# Patient Record
Sex: Female | Born: 1935 | Race: White | Hispanic: No | Smoking: Never smoker
Health system: Southern US, Community
[De-identification: ages and names within clinical notes are randomized; demographics above are authoritative.]

## PROBLEM LIST (undated history)

## (undated) DIAGNOSIS — R001 Bradycardia, unspecified: Secondary | ICD-10-CM

## (undated) DIAGNOSIS — N2889 Other specified disorders of kidney and ureter: Secondary | ICD-10-CM

## (undated) DIAGNOSIS — C649 Malignant neoplasm of unspecified kidney, except renal pelvis: Secondary | ICD-10-CM

## (undated) DIAGNOSIS — R0602 Shortness of breath: Secondary | ICD-10-CM

## (undated) DIAGNOSIS — I2510 Atherosclerotic heart disease of native coronary artery without angina pectoris: Secondary | ICD-10-CM

## (undated) DIAGNOSIS — I639 Cerebral infarction, unspecified: Secondary | ICD-10-CM

## (undated) DIAGNOSIS — N183 Chronic kidney disease, stage 3 unspecified: Secondary | ICD-10-CM

## (undated) DIAGNOSIS — H3530 Unspecified macular degeneration: Secondary | ICD-10-CM

## (undated) DIAGNOSIS — I350 Nonrheumatic aortic (valve) stenosis: Secondary | ICD-10-CM

## (undated) DIAGNOSIS — M1990 Unspecified osteoarthritis, unspecified site: Secondary | ICD-10-CM

## (undated) DIAGNOSIS — I509 Heart failure, unspecified: Secondary | ICD-10-CM

## (undated) DIAGNOSIS — R011 Cardiac murmur, unspecified: Secondary | ICD-10-CM

## (undated) DIAGNOSIS — D735 Infarction of spleen: Secondary | ICD-10-CM

## (undated) DIAGNOSIS — I480 Paroxysmal atrial fibrillation: Secondary | ICD-10-CM

## (undated) DIAGNOSIS — E785 Hyperlipidemia, unspecified: Secondary | ICD-10-CM

## (undated) DIAGNOSIS — I219 Acute myocardial infarction, unspecified: Secondary | ICD-10-CM

## (undated) DIAGNOSIS — I10 Essential (primary) hypertension: Secondary | ICD-10-CM

## (undated) DIAGNOSIS — G459 Transient cerebral ischemic attack, unspecified: Secondary | ICD-10-CM

## (undated) HISTORY — PX: CARDIAC CATHETERIZATION: SHX172

## (undated) HISTORY — PX: CORONARY ARTERY BYPASS GRAFT: SHX141

## (undated) HISTORY — DX: Other specified disorders of kidney and ureter: N28.89

## (undated) HISTORY — DX: Hyperlipidemia, unspecified: E78.5

## (undated) HISTORY — PX: TYMPANOPLASTY: SHX33

## (undated) HISTORY — PX: OTHER SURGICAL HISTORY: SHX169

## (undated) HISTORY — DX: Malignant neoplasm of unspecified kidney, except renal pelvis: C64.9

## (undated) HISTORY — PX: TOTAL ABDOMINAL HYSTERECTOMY: SHX209

## (undated) HISTORY — PX: CATARACT EXTRACTION W/ INTRAOCULAR LENS  IMPLANT, BILATERAL: SHX1307

## (undated) HISTORY — DX: Infarction of spleen: D73.5

---

## 1992-11-05 ENCOUNTER — Encounter (INDEPENDENT_AMBULATORY_CARE_PROVIDER_SITE_OTHER): Payer: Self-pay | Admitting: *Deleted

## 1992-11-05 LAB — CONVERTED CEMR LAB

## 1997-06-01 ENCOUNTER — Encounter: Admission: RE | Admit: 1997-06-01 | Discharge: 1997-06-01 | Payer: Self-pay | Admitting: Family Medicine

## 1997-06-15 ENCOUNTER — Encounter: Admission: RE | Admit: 1997-06-15 | Discharge: 1997-06-15 | Payer: Self-pay | Admitting: Family Medicine

## 1997-07-20 ENCOUNTER — Encounter: Admission: RE | Admit: 1997-07-20 | Discharge: 1997-07-20 | Payer: Self-pay | Admitting: Sports Medicine

## 1997-11-11 ENCOUNTER — Encounter: Admission: RE | Admit: 1997-11-11 | Discharge: 1997-11-11 | Payer: Self-pay | Admitting: Family Medicine

## 1997-12-07 ENCOUNTER — Encounter: Admission: RE | Admit: 1997-12-07 | Discharge: 1997-12-07 | Payer: Self-pay | Admitting: Family Medicine

## 1997-12-21 ENCOUNTER — Encounter: Admission: RE | Admit: 1997-12-21 | Discharge: 1997-12-21 | Payer: Self-pay | Admitting: Family Medicine

## 1997-12-24 ENCOUNTER — Encounter: Admission: RE | Admit: 1997-12-24 | Discharge: 1997-12-24 | Payer: Self-pay | Admitting: Family Medicine

## 1998-01-20 ENCOUNTER — Encounter: Admission: RE | Admit: 1998-01-20 | Discharge: 1998-01-20 | Payer: Self-pay | Admitting: Family Medicine

## 1998-02-08 ENCOUNTER — Encounter: Admission: RE | Admit: 1998-02-08 | Discharge: 1998-02-08 | Payer: Self-pay | Admitting: Sports Medicine

## 1998-03-22 ENCOUNTER — Encounter: Admission: RE | Admit: 1998-03-22 | Discharge: 1998-03-22 | Payer: Self-pay | Admitting: Sports Medicine

## 1998-03-31 ENCOUNTER — Encounter: Admission: RE | Admit: 1998-03-31 | Discharge: 1998-03-31 | Payer: Self-pay | Admitting: Family Medicine

## 1998-06-02 ENCOUNTER — Encounter: Admission: RE | Admit: 1998-06-02 | Discharge: 1998-06-02 | Payer: Self-pay | Admitting: Family Medicine

## 1998-08-22 ENCOUNTER — Encounter: Admission: RE | Admit: 1998-08-22 | Discharge: 1998-08-22 | Payer: Self-pay | Admitting: Family Medicine

## 1998-09-06 ENCOUNTER — Encounter: Admission: RE | Admit: 1998-09-06 | Discharge: 1998-09-06 | Payer: Self-pay | Admitting: Family Medicine

## 1999-02-09 ENCOUNTER — Encounter: Admission: RE | Admit: 1999-02-09 | Discharge: 1999-02-09 | Payer: Self-pay | Admitting: Family Medicine

## 1999-03-23 ENCOUNTER — Encounter: Admission: RE | Admit: 1999-03-23 | Discharge: 1999-03-23 | Payer: Self-pay | Admitting: Family Medicine

## 1999-04-07 ENCOUNTER — Encounter: Admission: RE | Admit: 1999-04-07 | Discharge: 1999-04-07 | Payer: Self-pay | Admitting: Family Medicine

## 1999-05-01 ENCOUNTER — Encounter: Admission: RE | Admit: 1999-05-01 | Discharge: 1999-05-01 | Payer: Self-pay | Admitting: Family Medicine

## 1999-05-11 ENCOUNTER — Encounter: Admission: RE | Admit: 1999-05-11 | Discharge: 1999-05-11 | Payer: Self-pay | Admitting: Family Medicine

## 1999-06-29 ENCOUNTER — Encounter: Admission: RE | Admit: 1999-06-29 | Discharge: 1999-06-29 | Payer: Self-pay | Admitting: Family Medicine

## 1999-09-21 ENCOUNTER — Encounter: Admission: RE | Admit: 1999-09-21 | Discharge: 1999-09-21 | Payer: Self-pay | Admitting: Family Medicine

## 1999-10-12 ENCOUNTER — Encounter: Admission: RE | Admit: 1999-10-12 | Discharge: 1999-10-12 | Payer: Self-pay | Admitting: Family Medicine

## 1999-10-12 ENCOUNTER — Encounter: Payer: Self-pay | Admitting: Family Medicine

## 1999-10-19 ENCOUNTER — Encounter: Admission: RE | Admit: 1999-10-19 | Discharge: 1999-10-19 | Payer: Self-pay | Admitting: Family Medicine

## 1999-11-08 ENCOUNTER — Encounter: Admission: RE | Admit: 1999-11-08 | Discharge: 1999-11-08 | Payer: Self-pay | Admitting: Family Medicine

## 1999-12-12 ENCOUNTER — Encounter: Admission: RE | Admit: 1999-12-12 | Discharge: 1999-12-12 | Payer: Self-pay | Admitting: Family Medicine

## 2000-02-06 HISTORY — PX: CORONARY ANGIOPLASTY WITH STENT PLACEMENT: SHX49

## 2000-02-22 ENCOUNTER — Encounter: Admission: RE | Admit: 2000-02-22 | Discharge: 2000-02-22 | Payer: Self-pay | Admitting: Family Medicine

## 2000-04-24 ENCOUNTER — Encounter: Admission: RE | Admit: 2000-04-24 | Discharge: 2000-04-24 | Payer: Self-pay | Admitting: Family Medicine

## 2000-04-26 ENCOUNTER — Ambulatory Visit (HOSPITAL_COMMUNITY): Admission: RE | Admit: 2000-04-26 | Discharge: 2000-04-26 | Payer: Self-pay | Admitting: Family Medicine

## 2000-04-26 ENCOUNTER — Encounter: Payer: Self-pay | Admitting: Sports Medicine

## 2000-04-26 ENCOUNTER — Encounter: Admission: RE | Admit: 2000-04-26 | Discharge: 2000-04-26 | Payer: Self-pay | Admitting: Family Medicine

## 2000-04-26 ENCOUNTER — Encounter: Admission: RE | Admit: 2000-04-26 | Discharge: 2000-04-26 | Payer: Self-pay | Admitting: Sports Medicine

## 2000-05-16 ENCOUNTER — Encounter: Admission: RE | Admit: 2000-05-16 | Discharge: 2000-05-16 | Payer: Self-pay | Admitting: Family Medicine

## 2000-05-28 ENCOUNTER — Encounter: Payer: Self-pay | Admitting: Cardiology

## 2000-05-29 ENCOUNTER — Inpatient Hospital Stay (HOSPITAL_COMMUNITY): Admission: RE | Admit: 2000-05-29 | Discharge: 2000-05-30 | Payer: Self-pay | Admitting: Cardiology

## 2000-09-05 ENCOUNTER — Encounter: Admission: RE | Admit: 2000-09-05 | Discharge: 2000-09-05 | Payer: Self-pay | Admitting: Family Medicine

## 2000-11-07 ENCOUNTER — Encounter: Admission: RE | Admit: 2000-11-07 | Discharge: 2000-11-07 | Payer: Self-pay | Admitting: Family Medicine

## 2001-02-05 DIAGNOSIS — I639 Cerebral infarction, unspecified: Secondary | ICD-10-CM

## 2001-02-05 HISTORY — DX: Cerebral infarction, unspecified: I63.9

## 2001-03-13 ENCOUNTER — Encounter: Admission: RE | Admit: 2001-03-13 | Discharge: 2001-03-13 | Payer: Self-pay | Admitting: Family Medicine

## 2001-05-06 ENCOUNTER — Encounter: Admission: RE | Admit: 2001-05-06 | Discharge: 2001-05-06 | Payer: Self-pay | Admitting: Family Medicine

## 2001-05-28 ENCOUNTER — Encounter: Admission: RE | Admit: 2001-05-28 | Discharge: 2001-05-28 | Payer: Self-pay | Admitting: Family Medicine

## 2001-05-28 ENCOUNTER — Ambulatory Visit (HOSPITAL_COMMUNITY): Admission: RE | Admit: 2001-05-28 | Discharge: 2001-05-28 | Payer: Self-pay | Admitting: Family Medicine

## 2001-05-30 ENCOUNTER — Inpatient Hospital Stay (HOSPITAL_COMMUNITY): Admission: EM | Admit: 2001-05-30 | Discharge: 2001-07-09 | Payer: Self-pay | Admitting: Emergency Medicine

## 2001-05-30 ENCOUNTER — Encounter (INDEPENDENT_AMBULATORY_CARE_PROVIDER_SITE_OTHER): Payer: Self-pay | Admitting: *Deleted

## 2001-05-30 ENCOUNTER — Encounter: Payer: Self-pay | Admitting: Emergency Medicine

## 2001-06-02 ENCOUNTER — Encounter: Payer: Self-pay | Admitting: Thoracic Surgery (Cardiothoracic Vascular Surgery)

## 2001-06-06 ENCOUNTER — Encounter: Payer: Self-pay | Admitting: Thoracic Surgery (Cardiothoracic Vascular Surgery)

## 2001-06-07 ENCOUNTER — Encounter: Payer: Self-pay | Admitting: Thoracic Surgery (Cardiothoracic Vascular Surgery)

## 2001-06-08 ENCOUNTER — Encounter: Payer: Self-pay | Admitting: Thoracic Surgery (Cardiothoracic Vascular Surgery)

## 2001-06-09 ENCOUNTER — Encounter: Payer: Self-pay | Admitting: Thoracic Surgery (Cardiothoracic Vascular Surgery)

## 2001-06-10 ENCOUNTER — Encounter: Payer: Self-pay | Admitting: Thoracic Surgery (Cardiothoracic Vascular Surgery)

## 2001-06-11 ENCOUNTER — Encounter: Payer: Self-pay | Admitting: Thoracic Surgery (Cardiothoracic Vascular Surgery)

## 2001-06-12 ENCOUNTER — Encounter: Payer: Self-pay | Admitting: Thoracic Surgery (Cardiothoracic Vascular Surgery)

## 2001-06-13 ENCOUNTER — Encounter: Payer: Self-pay | Admitting: Thoracic Surgery (Cardiothoracic Vascular Surgery)

## 2001-06-14 ENCOUNTER — Encounter: Payer: Self-pay | Admitting: Thoracic Surgery (Cardiothoracic Vascular Surgery)

## 2001-06-15 ENCOUNTER — Encounter: Payer: Self-pay | Admitting: Thoracic Surgery (Cardiothoracic Vascular Surgery)

## 2001-06-16 ENCOUNTER — Encounter: Payer: Self-pay | Admitting: Thoracic Surgery (Cardiothoracic Vascular Surgery)

## 2001-06-17 ENCOUNTER — Encounter: Payer: Self-pay | Admitting: Thoracic Surgery (Cardiothoracic Vascular Surgery)

## 2001-06-18 ENCOUNTER — Encounter: Payer: Self-pay | Admitting: Thoracic Surgery (Cardiothoracic Vascular Surgery)

## 2001-06-19 ENCOUNTER — Encounter: Payer: Self-pay | Admitting: Thoracic Surgery (Cardiothoracic Vascular Surgery)

## 2001-06-20 ENCOUNTER — Encounter: Payer: Self-pay | Admitting: Thoracic Surgery (Cardiothoracic Vascular Surgery)

## 2001-06-21 ENCOUNTER — Encounter: Payer: Self-pay | Admitting: Thoracic Surgery (Cardiothoracic Vascular Surgery)

## 2001-06-22 ENCOUNTER — Encounter: Payer: Self-pay | Admitting: Cardiothoracic Surgery

## 2001-06-23 ENCOUNTER — Encounter: Payer: Self-pay | Admitting: Thoracic Surgery (Cardiothoracic Vascular Surgery)

## 2001-06-24 ENCOUNTER — Encounter: Payer: Self-pay | Admitting: Thoracic Surgery (Cardiothoracic Vascular Surgery)

## 2001-06-27 ENCOUNTER — Encounter: Payer: Self-pay | Admitting: Thoracic Surgery (Cardiothoracic Vascular Surgery)

## 2001-07-01 ENCOUNTER — Encounter: Payer: Self-pay | Admitting: Thoracic Surgery (Cardiothoracic Vascular Surgery)

## 2001-07-02 ENCOUNTER — Encounter: Payer: Self-pay | Admitting: Thoracic Surgery (Cardiothoracic Vascular Surgery)

## 2001-07-04 ENCOUNTER — Encounter: Payer: Self-pay | Admitting: Thoracic Surgery (Cardiothoracic Vascular Surgery)

## 2001-07-07 ENCOUNTER — Encounter: Payer: Self-pay | Admitting: Thoracic Surgery (Cardiothoracic Vascular Surgery)

## 2001-07-08 ENCOUNTER — Encounter: Payer: Self-pay | Admitting: Thoracic Surgery (Cardiothoracic Vascular Surgery)

## 2001-07-09 ENCOUNTER — Inpatient Hospital Stay: Admission: AD | Admit: 2001-07-09 | Discharge: 2001-07-21 | Payer: Self-pay | Admitting: Cardiology

## 2001-08-11 ENCOUNTER — Encounter: Payer: Self-pay | Admitting: Thoracic Surgery (Cardiothoracic Vascular Surgery)

## 2001-08-11 ENCOUNTER — Encounter
Admission: RE | Admit: 2001-08-11 | Discharge: 2001-08-11 | Payer: Self-pay | Admitting: Thoracic Surgery (Cardiothoracic Vascular Surgery)

## 2001-08-17 ENCOUNTER — Inpatient Hospital Stay (HOSPITAL_COMMUNITY): Admission: EM | Admit: 2001-08-17 | Discharge: 2001-08-22 | Payer: Self-pay | Admitting: Emergency Medicine

## 2001-08-17 ENCOUNTER — Encounter: Payer: Self-pay | Admitting: Emergency Medicine

## 2001-08-18 ENCOUNTER — Encounter: Payer: Self-pay | Admitting: Cardiovascular Disease

## 2001-08-20 ENCOUNTER — Encounter: Payer: Self-pay | Admitting: Cardiovascular Disease

## 2001-08-21 ENCOUNTER — Encounter: Payer: Self-pay | Admitting: Cardiovascular Disease

## 2001-08-22 ENCOUNTER — Encounter: Payer: Self-pay | Admitting: Cardiovascular Disease

## 2001-09-11 ENCOUNTER — Encounter: Admission: RE | Admit: 2001-09-11 | Discharge: 2001-09-11 | Payer: Self-pay | Admitting: Family Medicine

## 2001-09-15 ENCOUNTER — Encounter: Admission: RE | Admit: 2001-09-15 | Discharge: 2001-09-15 | Payer: Self-pay | Admitting: Family Medicine

## 2001-09-17 ENCOUNTER — Inpatient Hospital Stay (HOSPITAL_COMMUNITY): Admission: EM | Admit: 2001-09-17 | Discharge: 2001-09-22 | Payer: Self-pay | Admitting: Emergency Medicine

## 2001-09-17 ENCOUNTER — Encounter: Payer: Self-pay | Admitting: Emergency Medicine

## 2001-09-18 ENCOUNTER — Encounter: Payer: Self-pay | Admitting: Family Medicine

## 2001-09-20 ENCOUNTER — Encounter: Payer: Self-pay | Admitting: Cardiology

## 2001-09-21 ENCOUNTER — Encounter: Payer: Self-pay | Admitting: Family Medicine

## 2001-09-22 ENCOUNTER — Encounter: Payer: Self-pay | Admitting: Family Medicine

## 2001-09-25 ENCOUNTER — Encounter: Admission: RE | Admit: 2001-09-25 | Discharge: 2001-09-25 | Payer: Self-pay | Admitting: Family Medicine

## 2001-10-03 ENCOUNTER — Encounter: Admission: RE | Admit: 2001-10-03 | Discharge: 2001-10-03 | Payer: Self-pay | Admitting: Family Medicine

## 2001-10-14 ENCOUNTER — Encounter: Admission: RE | Admit: 2001-10-14 | Discharge: 2001-10-14 | Payer: Self-pay | Admitting: Family Medicine

## 2001-10-14 ENCOUNTER — Encounter: Payer: Self-pay | Admitting: Family Medicine

## 2001-10-20 ENCOUNTER — Encounter: Admission: RE | Admit: 2001-10-20 | Discharge: 2001-10-20 | Payer: Self-pay | Admitting: Family Medicine

## 2001-10-21 ENCOUNTER — Encounter: Admission: RE | Admit: 2001-10-21 | Discharge: 2001-10-21 | Payer: Self-pay | Admitting: Family Medicine

## 2001-10-23 ENCOUNTER — Encounter: Admission: RE | Admit: 2001-10-23 | Discharge: 2001-10-23 | Payer: Self-pay | Admitting: Family Medicine

## 2001-12-12 ENCOUNTER — Encounter: Admission: RE | Admit: 2001-12-12 | Discharge: 2001-12-12 | Payer: Self-pay | Admitting: Family Medicine

## 2001-12-16 ENCOUNTER — Encounter: Admission: RE | Admit: 2001-12-16 | Discharge: 2001-12-16 | Payer: Self-pay | Admitting: Family Medicine

## 2001-12-23 ENCOUNTER — Encounter: Admission: RE | Admit: 2001-12-23 | Discharge: 2001-12-23 | Payer: Self-pay | Admitting: Family Medicine

## 2002-02-23 ENCOUNTER — Emergency Department (HOSPITAL_COMMUNITY): Admission: EM | Admit: 2002-02-23 | Discharge: 2002-02-23 | Payer: Self-pay | Admitting: Emergency Medicine

## 2002-02-26 ENCOUNTER — Encounter: Admission: RE | Admit: 2002-02-26 | Discharge: 2002-02-26 | Payer: Self-pay | Admitting: Family Medicine

## 2002-03-09 ENCOUNTER — Encounter: Admission: RE | Admit: 2002-03-09 | Discharge: 2002-03-09 | Payer: Self-pay | Admitting: Sports Medicine

## 2002-03-09 ENCOUNTER — Encounter: Payer: Self-pay | Admitting: Sports Medicine

## 2002-03-12 ENCOUNTER — Encounter: Admission: RE | Admit: 2002-03-12 | Discharge: 2002-03-12 | Payer: Self-pay | Admitting: Family Medicine

## 2002-03-19 ENCOUNTER — Encounter: Admission: RE | Admit: 2002-03-19 | Discharge: 2002-03-19 | Payer: Self-pay | Admitting: Family Medicine

## 2002-03-24 ENCOUNTER — Encounter: Admission: RE | Admit: 2002-03-24 | Discharge: 2002-05-05 | Payer: Self-pay | Admitting: Sports Medicine

## 2002-04-09 ENCOUNTER — Encounter: Admission: RE | Admit: 2002-04-09 | Discharge: 2002-04-09 | Payer: Self-pay | Admitting: Family Medicine

## 2002-05-12 ENCOUNTER — Encounter: Admission: RE | Admit: 2002-05-12 | Discharge: 2002-05-12 | Payer: Self-pay | Admitting: Family Medicine

## 2002-09-21 ENCOUNTER — Encounter: Admission: RE | Admit: 2002-09-21 | Discharge: 2002-09-21 | Payer: Self-pay | Admitting: Family Medicine

## 2002-11-13 ENCOUNTER — Encounter: Admission: RE | Admit: 2002-11-13 | Discharge: 2002-11-13 | Payer: Self-pay | Admitting: Sports Medicine

## 2002-11-24 ENCOUNTER — Encounter: Admission: RE | Admit: 2002-11-24 | Discharge: 2002-11-24 | Payer: Self-pay | Admitting: Sports Medicine

## 2002-12-30 ENCOUNTER — Encounter: Admission: RE | Admit: 2002-12-30 | Discharge: 2002-12-30 | Payer: Self-pay | Admitting: Family Medicine

## 2003-03-31 ENCOUNTER — Encounter: Admission: RE | Admit: 2003-03-31 | Discharge: 2003-03-31 | Payer: Self-pay | Admitting: Family Medicine

## 2003-04-29 ENCOUNTER — Encounter: Admission: RE | Admit: 2003-04-29 | Discharge: 2003-04-29 | Payer: Self-pay | Admitting: Family Medicine

## 2003-08-10 ENCOUNTER — Encounter: Admission: RE | Admit: 2003-08-10 | Discharge: 2003-08-10 | Payer: Self-pay | Admitting: Sports Medicine

## 2003-11-11 ENCOUNTER — Ambulatory Visit: Payer: Self-pay | Admitting: Family Medicine

## 2004-02-08 ENCOUNTER — Ambulatory Visit: Payer: Self-pay | Admitting: Family Medicine

## 2004-02-11 ENCOUNTER — Emergency Department (HOSPITAL_COMMUNITY): Admission: EM | Admit: 2004-02-11 | Discharge: 2004-02-11 | Payer: Self-pay | Admitting: Emergency Medicine

## 2004-03-09 ENCOUNTER — Ambulatory Visit: Payer: Self-pay | Admitting: Family Medicine

## 2004-05-25 ENCOUNTER — Ambulatory Visit: Payer: Self-pay | Admitting: Family Medicine

## 2004-05-29 ENCOUNTER — Ambulatory Visit: Payer: Self-pay | Admitting: Family Medicine

## 2004-11-23 ENCOUNTER — Ambulatory Visit: Payer: Self-pay | Admitting: Family Medicine

## 2004-12-21 ENCOUNTER — Ambulatory Visit: Payer: Self-pay | Admitting: Family Medicine

## 2005-01-02 ENCOUNTER — Ambulatory Visit: Payer: Self-pay | Admitting: Family Medicine

## 2005-01-02 ENCOUNTER — Encounter: Admission: RE | Admit: 2005-01-02 | Discharge: 2005-01-02 | Payer: Self-pay | Admitting: Family Medicine

## 2005-03-15 ENCOUNTER — Ambulatory Visit: Payer: Self-pay | Admitting: Family Medicine

## 2005-04-06 ENCOUNTER — Encounter: Admission: RE | Admit: 2005-04-06 | Discharge: 2005-04-06 | Payer: Self-pay | Admitting: Sports Medicine

## 2005-06-11 ENCOUNTER — Ambulatory Visit: Payer: Self-pay | Admitting: Family Medicine

## 2005-06-21 ENCOUNTER — Ambulatory Visit: Payer: Self-pay | Admitting: Family Medicine

## 2005-09-11 ENCOUNTER — Ambulatory Visit: Payer: Self-pay | Admitting: Family Medicine

## 2005-09-14 ENCOUNTER — Ambulatory Visit: Payer: Self-pay | Admitting: Family Medicine

## 2005-09-15 ENCOUNTER — Emergency Department (HOSPITAL_COMMUNITY): Admission: EM | Admit: 2005-09-15 | Discharge: 2005-09-15 | Payer: Self-pay | Admitting: *Deleted

## 2005-10-11 ENCOUNTER — Ambulatory Visit: Payer: Self-pay | Admitting: Family Medicine

## 2005-12-13 ENCOUNTER — Ambulatory Visit: Payer: Self-pay | Admitting: Family Medicine

## 2005-12-21 ENCOUNTER — Ambulatory Visit (HOSPITAL_COMMUNITY): Admission: RE | Admit: 2005-12-21 | Discharge: 2005-12-21 | Payer: Self-pay | Admitting: Otolaryngology

## 2006-01-10 ENCOUNTER — Ambulatory Visit: Payer: Self-pay | Admitting: Family Medicine

## 2006-02-21 ENCOUNTER — Ambulatory Visit: Payer: Self-pay | Admitting: Family Medicine

## 2006-02-27 ENCOUNTER — Ambulatory Visit (HOSPITAL_COMMUNITY): Admission: RE | Admit: 2006-02-27 | Discharge: 2006-02-28 | Payer: Self-pay | Admitting: Otolaryngology

## 2006-04-04 DIAGNOSIS — M159 Polyosteoarthritis, unspecified: Secondary | ICD-10-CM | POA: Insufficient documentation

## 2006-04-04 DIAGNOSIS — I5041 Acute combined systolic (congestive) and diastolic (congestive) heart failure: Secondary | ICD-10-CM | POA: Insufficient documentation

## 2006-04-04 DIAGNOSIS — I2510 Atherosclerotic heart disease of native coronary artery without angina pectoris: Secondary | ICD-10-CM | POA: Insufficient documentation

## 2006-04-04 DIAGNOSIS — I10 Essential (primary) hypertension: Secondary | ICD-10-CM | POA: Insufficient documentation

## 2006-04-04 DIAGNOSIS — E7800 Pure hypercholesterolemia, unspecified: Secondary | ICD-10-CM | POA: Insufficient documentation

## 2006-04-05 ENCOUNTER — Encounter (INDEPENDENT_AMBULATORY_CARE_PROVIDER_SITE_OTHER): Payer: Self-pay | Admitting: *Deleted

## 2006-04-08 ENCOUNTER — Encounter: Admission: RE | Admit: 2006-04-08 | Discharge: 2006-04-08 | Payer: Self-pay | Admitting: Family Medicine

## 2006-04-10 ENCOUNTER — Encounter: Payer: Self-pay | Admitting: Family Medicine

## 2006-06-03 ENCOUNTER — Telehealth: Payer: Self-pay | Admitting: *Deleted

## 2006-06-04 ENCOUNTER — Encounter: Payer: Self-pay | Admitting: *Deleted

## 2006-06-04 ENCOUNTER — Ambulatory Visit: Payer: Self-pay | Admitting: Family Medicine

## 2006-06-04 ENCOUNTER — Emergency Department (HOSPITAL_COMMUNITY): Admission: EM | Admit: 2006-06-04 | Discharge: 2006-06-04 | Payer: Self-pay | Admitting: Emergency Medicine

## 2006-06-04 ENCOUNTER — Telehealth: Payer: Self-pay | Admitting: Family Medicine

## 2006-06-04 DIAGNOSIS — H6690 Otitis media, unspecified, unspecified ear: Secondary | ICD-10-CM | POA: Insufficient documentation

## 2006-06-26 ENCOUNTER — Encounter: Payer: Self-pay | Admitting: Family Medicine

## 2006-07-18 ENCOUNTER — Ambulatory Visit: Payer: Self-pay | Admitting: Family Medicine

## 2006-07-18 ENCOUNTER — Encounter: Admission: RE | Admit: 2006-07-18 | Discharge: 2006-07-18 | Payer: Self-pay | Admitting: Family Medicine

## 2006-07-18 LAB — CONVERTED CEMR LAB
ALT: 14 units/L (ref 0–35)
AST: 19 units/L (ref 0–37)
Albumin: 4.3 g/dL (ref 3.5–5.2)
Alkaline Phosphatase: 52 units/L (ref 39–117)
BUN: 30 mg/dL — ABNORMAL HIGH (ref 6–23)
CO2: 22 meq/L (ref 19–32)
Calcium: 9.3 mg/dL (ref 8.4–10.5)
Chloride: 104 meq/L (ref 96–112)
Creatinine, Ser: 1.62 mg/dL — ABNORMAL HIGH (ref 0.40–1.20)
Glucose, Bld: 119 mg/dL — ABNORMAL HIGH (ref 70–99)
Potassium: 4.8 meq/L (ref 3.5–5.3)
Sodium: 137 meq/L (ref 135–145)
Total Bilirubin: 1 mg/dL (ref 0.3–1.2)
Total Protein: 7.1 g/dL (ref 6.0–8.3)

## 2006-07-24 ENCOUNTER — Telehealth: Payer: Self-pay | Admitting: Family Medicine

## 2006-08-01 ENCOUNTER — Encounter: Payer: Self-pay | Admitting: Family Medicine

## 2006-08-06 ENCOUNTER — Ambulatory Visit: Payer: Self-pay | Admitting: Family Medicine

## 2006-08-19 ENCOUNTER — Ambulatory Visit: Payer: Self-pay | Admitting: Infectious Diseases

## 2006-09-18 ENCOUNTER — Ambulatory Visit: Payer: Self-pay | Admitting: Infectious Diseases

## 2006-10-23 ENCOUNTER — Ambulatory Visit: Payer: Self-pay | Admitting: Infectious Diseases

## 2006-11-07 ENCOUNTER — Ambulatory Visit: Payer: Self-pay | Admitting: Family Medicine

## 2006-11-07 LAB — CONVERTED CEMR LAB
HCT: 40 % (ref 36.0–46.0)
Hemoglobin: 13.4 g/dL (ref 12.0–15.0)
MCHC: 33.5 g/dL (ref 30.0–36.0)
MCV: 90.9 fL (ref 78.0–100.0)
Platelets: 234 10*3/uL (ref 150–400)
RBC: 4.4 M/uL (ref 3.87–5.11)
RDW: 12.9 % (ref 11.5–14.0)
WBC: 7.8 10*3/uL (ref 4.0–10.5)

## 2006-12-03 ENCOUNTER — Encounter: Payer: Self-pay | Admitting: Family Medicine

## 2006-12-09 ENCOUNTER — Ambulatory Visit: Payer: Self-pay | Admitting: Family Medicine

## 2006-12-09 DIAGNOSIS — J309 Allergic rhinitis, unspecified: Secondary | ICD-10-CM | POA: Insufficient documentation

## 2007-01-09 ENCOUNTER — Ambulatory Visit: Payer: Self-pay | Admitting: Family Medicine

## 2007-05-05 ENCOUNTER — Encounter: Payer: Self-pay | Admitting: *Deleted

## 2007-05-05 ENCOUNTER — Encounter (INDEPENDENT_AMBULATORY_CARE_PROVIDER_SITE_OTHER): Payer: Self-pay | Admitting: *Deleted

## 2007-05-05 ENCOUNTER — Ambulatory Visit: Payer: Self-pay | Admitting: Family Medicine

## 2007-06-09 ENCOUNTER — Ambulatory Visit: Payer: Self-pay | Admitting: Family Medicine

## 2007-06-09 DIAGNOSIS — G4700 Insomnia, unspecified: Secondary | ICD-10-CM | POA: Insufficient documentation

## 2007-06-09 LAB — CONVERTED CEMR LAB
ALT: 21 units/L (ref 0–35)
AST: 21 units/L (ref 0–37)
Albumin: 4.3 g/dL (ref 3.5–5.2)
Alkaline Phosphatase: 69 units/L (ref 39–117)
BUN: 33 mg/dL — ABNORMAL HIGH (ref 6–23)
CO2: 25 meq/L (ref 19–32)
Calcium: 9.9 mg/dL (ref 8.4–10.5)
Chloride: 100 meq/L (ref 96–112)
Creatinine, Ser: 1.36 mg/dL — ABNORMAL HIGH (ref 0.40–1.20)
Glucose, Bld: 111 mg/dL — ABNORMAL HIGH (ref 70–99)
Potassium: 5.4 meq/L — ABNORMAL HIGH (ref 3.5–5.3)
Sodium: 139 meq/L (ref 135–145)
TSH: 3.515 microintl units/mL (ref 0.350–5.50)
Total Bilirubin: 0.6 mg/dL (ref 0.3–1.2)
Total Protein: 7.3 g/dL (ref 6.0–8.3)

## 2007-06-10 ENCOUNTER — Encounter: Payer: Self-pay | Admitting: Family Medicine

## 2007-06-12 ENCOUNTER — Ambulatory Visit (HOSPITAL_COMMUNITY): Admission: RE | Admit: 2007-06-12 | Discharge: 2007-06-12 | Payer: Self-pay | Admitting: Family Medicine

## 2007-06-17 ENCOUNTER — Telehealth: Payer: Self-pay | Admitting: *Deleted

## 2007-06-17 ENCOUNTER — Ambulatory Visit: Payer: Self-pay | Admitting: Family Medicine

## 2007-06-17 ENCOUNTER — Ambulatory Visit (HOSPITAL_COMMUNITY): Admission: RE | Admit: 2007-06-17 | Discharge: 2007-06-17 | Payer: Self-pay | Admitting: Family Medicine

## 2007-06-17 DIAGNOSIS — E875 Hyperkalemia: Secondary | ICD-10-CM | POA: Insufficient documentation

## 2007-06-18 LAB — CONVERTED CEMR LAB
BUN: 53 mg/dL — ABNORMAL HIGH (ref 6–23)
CO2: 19 meq/L (ref 19–32)
Calcium: 10.1 mg/dL (ref 8.4–10.5)
Chloride: 94 meq/L — ABNORMAL LOW (ref 96–112)
Creatinine, Ser: 2.65 mg/dL — ABNORMAL HIGH (ref 0.40–1.20)
Glucose, Bld: 131 mg/dL — ABNORMAL HIGH (ref 70–99)
HCT: 38.8 % (ref 36.0–46.0)
Hemoglobin: 13.2 g/dL (ref 12.0–15.0)
MCHC: 34 g/dL (ref 30.0–36.0)
MCV: 89.4 fL (ref 78.0–100.0)
Platelets: 295 10*3/uL (ref 150–400)
Potassium: 5.9 meq/L — ABNORMAL HIGH (ref 3.5–5.3)
RBC: 4.34 M/uL (ref 3.87–5.11)
RDW: 13.2 % (ref 11.5–15.5)
Sodium: 128 meq/L — ABNORMAL LOW (ref 135–145)
WBC: 10.3 10*3/uL (ref 4.0–10.5)

## 2007-06-20 ENCOUNTER — Ambulatory Visit: Payer: Self-pay | Admitting: Family Medicine

## 2007-06-20 ENCOUNTER — Encounter (INDEPENDENT_AMBULATORY_CARE_PROVIDER_SITE_OTHER): Payer: Self-pay | Admitting: Family Medicine

## 2007-06-20 LAB — CONVERTED CEMR LAB
BUN: 45 mg/dL — ABNORMAL HIGH (ref 6–23)
CO2: 24 meq/L (ref 19–32)
Calcium: 9.9 mg/dL (ref 8.4–10.5)
Chloride: 98 meq/L (ref 96–112)
Creatinine, Ser: 1.67 mg/dL — ABNORMAL HIGH (ref 0.40–1.20)
Glucose, Bld: 124 mg/dL — ABNORMAL HIGH (ref 70–99)
Potassium: 5.8 meq/L — ABNORMAL HIGH (ref 3.5–5.3)
Sodium: 132 meq/L — ABNORMAL LOW (ref 135–145)

## 2007-06-23 ENCOUNTER — Ambulatory Visit: Payer: Self-pay | Admitting: Family Medicine

## 2007-06-23 ENCOUNTER — Encounter: Payer: Self-pay | Admitting: Family Medicine

## 2007-06-23 LAB — CONVERTED CEMR LAB
BUN: 29 mg/dL — ABNORMAL HIGH (ref 6–23)
CO2: 24 meq/L (ref 19–32)
Calcium: 9.7 mg/dL (ref 8.4–10.5)
Chloride: 102 meq/L (ref 96–112)
Creatinine, Ser: 1.29 mg/dL — ABNORMAL HIGH (ref 0.40–1.20)
Glucose, Bld: 121 mg/dL — ABNORMAL HIGH (ref 70–99)
Potassium: 4.9 meq/L (ref 3.5–5.3)
Sodium: 138 meq/L (ref 135–145)

## 2007-06-26 ENCOUNTER — Encounter (INDEPENDENT_AMBULATORY_CARE_PROVIDER_SITE_OTHER): Payer: Self-pay | Admitting: Family Medicine

## 2007-06-26 ENCOUNTER — Ambulatory Visit: Payer: Self-pay | Admitting: Family Medicine

## 2007-06-26 LAB — CONVERTED CEMR LAB
BUN: 25 mg/dL — ABNORMAL HIGH (ref 6–23)
CO2: 22 meq/L (ref 19–32)
Calcium: 9.4 mg/dL (ref 8.4–10.5)
Chloride: 103 meq/L (ref 96–112)
Creatinine, Ser: 1.09 mg/dL (ref 0.40–1.20)
Glucose, Bld: 115 mg/dL — ABNORMAL HIGH (ref 70–99)
Potassium: 5 meq/L (ref 3.5–5.3)
Sodium: 136 meq/L (ref 135–145)

## 2007-06-27 ENCOUNTER — Encounter (INDEPENDENT_AMBULATORY_CARE_PROVIDER_SITE_OTHER): Payer: Self-pay | Admitting: Family Medicine

## 2007-07-14 ENCOUNTER — Ambulatory Visit: Payer: Self-pay | Admitting: Family Medicine

## 2007-07-18 ENCOUNTER — Encounter: Payer: Self-pay | Admitting: *Deleted

## 2007-07-18 LAB — CONVERTED CEMR LAB
Potassium: 4.6 meq/L
Sodium: 138 meq/L

## 2007-07-29 ENCOUNTER — Telehealth: Payer: Self-pay | Admitting: *Deleted

## 2007-07-29 ENCOUNTER — Encounter: Payer: Self-pay | Admitting: *Deleted

## 2007-07-30 ENCOUNTER — Telehealth (INDEPENDENT_AMBULATORY_CARE_PROVIDER_SITE_OTHER): Payer: Self-pay | Admitting: Family Medicine

## 2007-08-14 ENCOUNTER — Ambulatory Visit: Payer: Self-pay | Admitting: Family Medicine

## 2007-08-14 LAB — CONVERTED CEMR LAB
BUN: 26 mg/dL — ABNORMAL HIGH (ref 6–23)
CO2: 24 meq/L (ref 19–32)
Calcium: 10 mg/dL (ref 8.4–10.5)
Chloride: 101 meq/L (ref 96–112)
Creatinine, Ser: 1.24 mg/dL — ABNORMAL HIGH (ref 0.40–1.20)
Glucose, Bld: 123 mg/dL — ABNORMAL HIGH (ref 70–99)
Potassium: 4.8 meq/L (ref 3.5–5.3)
Sodium: 138 meq/L (ref 135–145)

## 2007-08-15 ENCOUNTER — Encounter: Payer: Self-pay | Admitting: Family Medicine

## 2007-08-27 ENCOUNTER — Telehealth: Payer: Self-pay | Admitting: *Deleted

## 2007-09-10 ENCOUNTER — Encounter: Payer: Self-pay | Admitting: Family Medicine

## 2007-09-19 ENCOUNTER — Encounter: Payer: Self-pay | Admitting: Family Medicine

## 2007-10-01 ENCOUNTER — Encounter: Payer: Self-pay | Admitting: Family Medicine

## 2007-11-03 ENCOUNTER — Telehealth: Payer: Self-pay | Admitting: *Deleted

## 2007-11-10 ENCOUNTER — Ambulatory Visit: Payer: Self-pay | Admitting: Family Medicine

## 2007-11-10 DIAGNOSIS — L219 Seborrheic dermatitis, unspecified: Secondary | ICD-10-CM | POA: Insufficient documentation

## 2007-11-10 LAB — CONVERTED CEMR LAB

## 2007-11-27 ENCOUNTER — Telehealth: Payer: Self-pay | Admitting: *Deleted

## 2007-11-27 ENCOUNTER — Encounter: Payer: Self-pay | Admitting: Family Medicine

## 2008-01-26 ENCOUNTER — Telehealth: Payer: Self-pay | Admitting: *Deleted

## 2008-01-27 ENCOUNTER — Encounter: Payer: Self-pay | Admitting: Family Medicine

## 2008-01-27 ENCOUNTER — Ambulatory Visit: Payer: Self-pay | Admitting: Family Medicine

## 2008-01-28 ENCOUNTER — Encounter: Payer: Self-pay | Admitting: Family Medicine

## 2008-01-28 LAB — CONVERTED CEMR LAB
ALT: 13 units/L (ref 0–35)
AST: 16 units/L (ref 0–37)
Albumin: 4.4 g/dL (ref 3.5–5.2)
Alkaline Phosphatase: 73 units/L (ref 39–117)
BUN: 31 mg/dL — ABNORMAL HIGH (ref 6–23)
CO2: 22 meq/L (ref 19–32)
Calcium: 9.9 mg/dL (ref 8.4–10.5)
Chloride: 98 meq/L (ref 96–112)
Creatinine, Ser: 1.36 mg/dL — ABNORMAL HIGH (ref 0.40–1.20)
Glucose, Bld: 104 mg/dL — ABNORMAL HIGH (ref 70–99)
Potassium: 5.2 meq/L (ref 3.5–5.3)
Sodium: 138 meq/L (ref 135–145)
Total Bilirubin: 0.8 mg/dL (ref 0.3–1.2)
Total Protein: 7.7 g/dL (ref 6.0–8.3)

## 2008-03-11 ENCOUNTER — Ambulatory Visit: Payer: Self-pay | Admitting: Infectious Diseases

## 2008-03-15 ENCOUNTER — Telehealth: Payer: Self-pay | Admitting: Infectious Diseases

## 2008-04-12 ENCOUNTER — Ambulatory Visit: Payer: Self-pay | Admitting: Infectious Diseases

## 2008-04-29 ENCOUNTER — Encounter: Payer: Self-pay | Admitting: Infectious Diseases

## 2008-05-10 ENCOUNTER — Encounter: Payer: Self-pay | Admitting: Infectious Diseases

## 2008-05-11 ENCOUNTER — Telehealth: Payer: Self-pay | Admitting: *Deleted

## 2008-05-14 ENCOUNTER — Encounter: Payer: Self-pay | Admitting: Family Medicine

## 2008-05-14 ENCOUNTER — Ambulatory Visit: Payer: Self-pay | Admitting: Family Medicine

## 2008-06-07 ENCOUNTER — Encounter: Payer: Self-pay | Admitting: Infectious Diseases

## 2008-08-04 ENCOUNTER — Encounter: Payer: Self-pay | Admitting: Family Medicine

## 2008-08-19 LAB — CONVERTED CEMR LAB
Cholesterol: 195 mg/dL (ref 0–200)
HDL: 43 mg/dL (ref >39)
LDL Cholesterol: 100 mg/dL — ABNORMAL HIGH (ref 0–99)
Total CHOL/HDL Ratio: 4.5
Triglycerides: 260 mg/dL — ABNORMAL HIGH (ref <150)
VLDL: 52 mg/dL — ABNORMAL HIGH (ref 0–40)

## 2008-09-29 ENCOUNTER — Encounter: Payer: Self-pay | Admitting: Family Medicine

## 2008-10-07 ENCOUNTER — Ambulatory Visit: Payer: Self-pay | Admitting: Family Medicine

## 2008-10-07 ENCOUNTER — Telehealth: Payer: Self-pay | Admitting: *Deleted

## 2008-10-07 LAB — CONVERTED CEMR LAB
Bilirubin Urine: NEGATIVE
Casts: >20 /lpf
Glucose, Urine, Semiquant: NEGATIVE
Ketones, urine, test strip: NEGATIVE
Nitrite: NEGATIVE
Protein, U semiquant: NEGATIVE
Specific Gravity, Urine: 1.02
Urobilinogen, UA: 0.2
WBC, UA: >20 cells/hpf
pH: 6

## 2008-11-15 ENCOUNTER — Ambulatory Visit: Payer: Self-pay | Admitting: Family Medicine

## 2008-11-15 LAB — CONVERTED CEMR LAB
ALT: 13 units/L (ref 0–35)
AST: 19 units/L (ref 0–37)
Albumin: 4.5 g/dL (ref 3.5–5.2)
Alkaline Phosphatase: 61 units/L (ref 39–117)
BUN: 33 mg/dL — ABNORMAL HIGH (ref 6–23)
CO2: 23 meq/L (ref 19–32)
Calcium: 10.2 mg/dL (ref 8.4–10.5)
Chloride: 102 meq/L (ref 96–112)
Creatinine, Ser: 1.72 mg/dL — ABNORMAL HIGH (ref 0.40–1.20)
Glucose, Bld: 92 mg/dL (ref 70–99)
HCT: 35.8 % — ABNORMAL LOW (ref 36.0–46.0)
Hemoglobin: 11.6 g/dL — ABNORMAL LOW (ref 12.0–15.0)
MCHC: 32.4 g/dL (ref 30.0–36.0)
MCV: 93 fL (ref 78.0–100.0)
Platelets: 224 10*3/uL (ref 150–400)
Potassium: 5.4 meq/L — ABNORMAL HIGH (ref 3.5–5.3)
RBC: 3.85 M/uL — ABNORMAL LOW (ref 3.87–5.11)
RDW: 13.2 % (ref 11.5–15.5)
Sodium: 140 meq/L (ref 135–145)
Total Bilirubin: 0.7 mg/dL (ref 0.3–1.2)
Total Protein: 7.4 g/dL (ref 6.0–8.3)
WBC: 7.9 10*3/uL (ref 4.0–10.5)

## 2008-11-16 ENCOUNTER — Encounter: Payer: Self-pay | Admitting: Family Medicine

## 2008-11-23 ENCOUNTER — Telehealth: Payer: Self-pay | Admitting: Family Medicine

## 2008-12-06 ENCOUNTER — Ambulatory Visit: Payer: Self-pay | Admitting: Family Medicine

## 2008-12-06 ENCOUNTER — Encounter: Payer: Self-pay | Admitting: Family Medicine

## 2008-12-06 LAB — CONVERTED CEMR LAB
BUN: 29 mg/dL — ABNORMAL HIGH (ref 6–23)
CO2: 26 meq/L (ref 19–32)
Calcium: 10.2 mg/dL (ref 8.4–10.5)
Chloride: 101 meq/L (ref 96–112)
Creatinine, Ser: 1.35 mg/dL — ABNORMAL HIGH (ref 0.40–1.20)
Ferritin: 75 ng/mL (ref 10–291)
Glucose, Bld: 86 mg/dL (ref 70–99)
HCT: 36.3 % (ref 36.0–46.0)
Hemoglobin: 11.8 g/dL — ABNORMAL LOW (ref 12.0–15.0)
MCHC: 32.5 g/dL (ref 30.0–36.0)
MCV: 93.6 fL (ref 78.0–100.0)
Platelets: 238 10*3/uL (ref 150–400)
Potassium: 5.1 meq/L (ref 3.5–5.3)
RBC: 3.88 M/uL (ref 3.87–5.11)
RDW: 13.1 % (ref 11.5–15.5)
Sodium: 138 meq/L (ref 135–145)
Vitamin B-12: 1767 pg/mL — ABNORMAL HIGH (ref 211–911)
WBC: 10.5 10*3/uL (ref 4.0–10.5)

## 2008-12-07 ENCOUNTER — Encounter: Payer: Self-pay | Admitting: Family Medicine

## 2009-01-07 ENCOUNTER — Encounter: Payer: Self-pay | Admitting: Family Medicine

## 2009-01-26 ENCOUNTER — Encounter: Payer: Self-pay | Admitting: Family Medicine

## 2009-04-07 ENCOUNTER — Ambulatory Visit: Payer: Self-pay | Admitting: Family Medicine

## 2009-04-07 ENCOUNTER — Telehealth: Payer: Self-pay | Admitting: Family Medicine

## 2009-04-21 ENCOUNTER — Ambulatory Visit: Payer: Self-pay | Admitting: Family Medicine

## 2009-04-22 ENCOUNTER — Encounter: Payer: Self-pay | Admitting: Family Medicine

## 2009-04-22 LAB — CONVERTED CEMR LAB
ALT: 20 units/L (ref 0–35)
AST: 23 units/L (ref 0–37)
Albumin: 4.5 g/dL (ref 3.5–5.2)
Alkaline Phosphatase: 70 units/L (ref 39–117)
BUN: 24 mg/dL — ABNORMAL HIGH (ref 6–23)
CO2: 25 meq/L (ref 19–32)
Calcium: 9.9 mg/dL (ref 8.4–10.5)
Chloride: 100 meq/L (ref 96–112)
Cholesterol: 175 mg/dL (ref 0–200)
Creatinine, Ser: 1.48 mg/dL — ABNORMAL HIGH (ref 0.40–1.20)
Glucose, Bld: 122 mg/dL — ABNORMAL HIGH (ref 70–99)
HCT: 39.6 % (ref 36.0–46.0)
HDL: 41 mg/dL (ref >39)
Hemoglobin: 12.8 g/dL (ref 12.0–15.0)
LDL Cholesterol: 81 mg/dL (ref 0–99)
MCHC: 32.3 g/dL (ref 30.0–36.0)
MCV: 91.9 fL (ref 78.0–100.0)
Platelets: 244 10*3/uL (ref 150–400)
Potassium: 5.1 meq/L (ref 3.5–5.3)
RBC: 4.31 M/uL (ref 3.87–5.11)
RDW: 13.5 % (ref 11.5–15.5)
Sodium: 137 meq/L (ref 135–145)
Total Bilirubin: 1.1 mg/dL (ref 0.3–1.2)
Total CHOL/HDL Ratio: 4.3
Total Protein: 7.4 g/dL (ref 6.0–8.3)
Triglycerides: 265 mg/dL — ABNORMAL HIGH (ref <150)
VLDL: 53 mg/dL — ABNORMAL HIGH (ref 0–40)
WBC: 9.8 10*3/uL (ref 4.0–10.5)

## 2009-06-27 ENCOUNTER — Telehealth: Payer: Self-pay | Admitting: Family Medicine

## 2009-07-06 ENCOUNTER — Encounter: Payer: Self-pay | Admitting: Family Medicine

## 2009-07-06 ENCOUNTER — Ambulatory Visit: Payer: Self-pay | Admitting: Family Medicine

## 2009-07-06 LAB — CONVERTED CEMR LAB
ALT: 17 units/L (ref 0–35)
AST: 19 units/L (ref 0–37)
Albumin: 4.6 g/dL (ref 3.5–5.2)
Alkaline Phosphatase: 58 units/L (ref 39–117)
BUN: 28 mg/dL — ABNORMAL HIGH (ref 6–23)
CO2: 25 meq/L (ref 19–32)
Calcium: 10.1 mg/dL (ref 8.4–10.5)
Chloride: 102 meq/L (ref 96–112)
Creatinine, Ser: 1.44 mg/dL — ABNORMAL HIGH (ref 0.40–1.20)
Glucose, Bld: 107 mg/dL — ABNORMAL HIGH (ref 70–99)
Potassium: 4.9 meq/L (ref 3.5–5.3)
Sodium: 139 meq/L (ref 135–145)
Total Bilirubin: 1.2 mg/dL (ref 0.3–1.2)
Total Protein: 7.5 g/dL (ref 6.0–8.3)

## 2009-07-07 ENCOUNTER — Encounter: Payer: Self-pay | Admitting: Family Medicine

## 2009-07-07 ENCOUNTER — Telehealth (INDEPENDENT_AMBULATORY_CARE_PROVIDER_SITE_OTHER): Payer: Self-pay | Admitting: *Deleted

## 2009-07-11 ENCOUNTER — Encounter: Payer: Self-pay | Admitting: *Deleted

## 2009-08-04 ENCOUNTER — Encounter: Payer: Self-pay | Admitting: Family Medicine

## 2009-10-03 ENCOUNTER — Ambulatory Visit: Payer: Self-pay | Admitting: Vascular Surgery

## 2009-11-23 ENCOUNTER — Ambulatory Visit: Payer: Self-pay | Admitting: Family Medicine

## 2009-12-16 ENCOUNTER — Encounter: Payer: Self-pay | Admitting: Family Medicine

## 2009-12-23 ENCOUNTER — Telehealth (INDEPENDENT_AMBULATORY_CARE_PROVIDER_SITE_OTHER): Payer: Self-pay | Admitting: *Deleted

## 2010-02-27 ENCOUNTER — Encounter (INDEPENDENT_AMBULATORY_CARE_PROVIDER_SITE_OTHER): Payer: Self-pay | Admitting: *Deleted

## 2010-03-07 NOTE — Progress Notes (Signed)
Summary: Triage  Phone Note Call from Patient Call back at Home Phone [phone]   Summary of Call: Pt has sores on head, would like to be seen by Dr. Erin Hearing this week if possible. Initial call taken by: Drucie Ip,  November 03, 2007 10:47 AM  Follow-up for Phone Call        states she has been picking at them & they are scaly. does not know how long they have been there. refuses to see any other md. appt made for next monday Follow-up by: Elige Radon RN,  November 03, 2007 10:53 AM

## 2010-03-07 NOTE — Assessment & Plan Note (Signed)
Summary: FU/KH   Vital Signs:  Patient Profile:   77 Years Old Female Height:     64 inches (162.56 cm) Weight:      161.6 pounds BMI:     27.84 Temp:     98.3 degrees F oral Pulse rate:   65 / minute BP sitting:   165 / 81  (left arm) Cuff size:   regular  Pt. in pain?   yes    Location:   rt. hand  Vitals Entered By: Levert Feinstein LPN (June  8, 579FGE QA348G PM)                  PCP:  Talbert Cage MD  Chief Complaint:  f/u visit.  History of Present Illness: follow up Vomiting and HyperKalemia No vomiting or any residual symptoms.  Back to her normal weight. Started back her torsemide because of her ankle swelling but is not taking potassium or aldactone.  No cramping or shortness of breath or unusual edema  Thumb wrist DJD still hurts most of the time.  Tried a splint and this seemed to help.  No rash or redness or fever. Take   CHF No unusual shortness of breath or orthopnea or PND.  Saw Dr Melvern Banker recently.  Has echo scheduled.  Had blood drawn this AM.  ROS - as above PMH - Medications reviewed and updated in medication list.  Smoking Status noted in VS form      Current Allergies: ! * TRAMADOL      Physical Exam  General:     Well-developed,well-nourished,in no acute distress; alert,appropriate and cooperative throughout examination Extremities:     Trace edema bilaterally Left wrist in compression bandage    Impression & Recommendations:  Problem # 1:  HYPERKALEMIA (ICD-276.7) Resolved clinically.  Will follow up on her recent blood work.  Continue to HOLD aldactone Orders: [place]- Est Level  3 [id number])   Problem # 2:  HAND PAIN (ICD-729.5) Combination of djd and carpal tunnel.  Has no muscle wasting.  Trial of wrist splint continue Tylenol with vicoden when severe Orders: [place]- Est Level  3 [id number])   Problem # 3:  OSTEOARTHRITIS, MULTI SITES (ICD-715.98)  Her updated medication list for this problem includes:    Adprin B 325 Mg Tabs  (Aspirin buf(cacarb-mgcarb-mgo)) .Marland Kitchen... Take 1 tablet by mouth once a day    Vicodin 5-500 Mg Tabs (Hydrocodone-acetaminophen) .Marland Kitchen... 1 by mouth two times a day as needed thumb pain   Complete Medication List: 1)  Altace 10 Mg Caps (Ramipril) .... Take 1 tablet by mouth once a day 2)  Crestor 20 Mg Tabs (Rosuvastatin calcium) .... Take 1 tablet by mouth once a day 3)  Coreg 25 Mg Tabs (Carvedilol) .... One tab by mouth two times a day 4)  Aldactone 25 Mg Tabs (Spironolactone) .... Take 1 tablet by mouth once a day holding 5)  Demadex 20 Mg Tabs (Torsemide) .... One tablet every other day for fluid 6)  Adprin B 325 Mg Tabs (Aspirin buf(cacarb-mgcarb-mgo)) .... Take 1 tablet by mouth once a day 7)  Bl Vitamin C 500 Mg Tabs (Ascorbic acid) .... Once daily 8)  Vitamin E Natural 400 Unit Caps (Vitamin e) .... Take 1 tablet by mouth once a day 9)  Omega-3 350 Mg Caps (Omega-3 fatty acids) .... 1000mg  once daily 10)  Vicodin 5-500 Mg Tabs (Hydrocodone-acetaminophen) .Marland Kitchen.. 1 by mouth two times a day as needed thumb pain 11)  Allegra-d 12 Hour 60-120  Mg Tb12 (Fexofenadine-pseudoephedrine) .Marland Kitchen.. 1 by mouth two times a day prn 12)  Prilosec 10 Mg Cpdr (Omeprazole) .Marland Kitchen.. 1-2 by mouth at bedtime 13)  Calcium Carbonate-vitamin D 600-400 Mg-unit Tabs (Calcium carbonate-vitamin d) .... 2 daily 14)  Ambien 10 Mg Tabs (Zolpidem tartrate) .... 1/2 -1 by mouth at bedtime as needed take only rarely   Patient Instructions: 1)  Please schedule a follow-up appointment in 3 -6 months. 2)  I will check with Dr Melvern Banker for your blood tests 3)  For now keep NOT taking  the Spironolactone 4)  Wear splint when your wrist or thumb hurts but not all the time  5)  We will talk about your colonoscopy next visit   ]

## 2010-03-07 NOTE — Consult Note (Signed)
Summary: [facility]  [facility]   Imported By: Drucie Ip 06/28/2006 14:22:21  _____________________________________________________________________  External Attachment:    Type:   Image     Comment:   External Document

## 2010-03-07 NOTE — Consult Note (Signed)
Summary: [facility]   Imported By: Bonner Puna 06/10/2008 14:42:02  _____________________________________________________________________  External Attachment:    Type:   Image     Comment:   External Document

## 2010-03-07 NOTE — Miscellaneous (Signed)
  Clinical Lists Changes  Medications: Changed medication from COREG 25 MG  TABS (CARVEDILOL) two tab by mouth two times a day to COREG 25 MG  TABS (CARVEDILOL) one tab by mouth two times a day

## 2010-03-07 NOTE — Miscellaneous (Signed)
Summary: triage call from daughter  Clinical Lists Changes  aughter calls concerned about mother .she is complaining of arm pain, hand numbess. she is not with her at this time .she thinks she has gone in her car to her ENT appointment with Dr. Constance Holster. daughter is concerned she is having a heartattack.states she will call doctors office and if she is there will have her to to hospital. ..................................................................Marland KitchenMarcell Barlow RN  June 04, 2006 1:01 PM

## 2010-03-07 NOTE — Progress Notes (Signed)
Summary: triage  Phone Note Call from Patient Call back at Home Phone [phone]   Caller: Patient Summary of Call: pt feels like she has a UTI - not sure if she can get meds or have to be seen Initial call taken by: Audie Clear,  October 07, 2008 8:44 AM  Follow-up for Phone Call        freq urination, pain with urination. started yesterday. has been up most of the night with pain. will see Dr. Oneal Grout this am at 11 Follow-up by: Elige Radon RN,  October 07, 2008 8:56 AM  Additional Follow-up for Phone Call Additional follow up Details #1::        Pls put her on my schedule Additional Follow-up by: Talbert Cage MD,  October 07, 2008 10:29 AM    Additional Follow-up for Phone Call Additional follow up Details #2::    done Follow-up by: Christen Bame CMA,  October 07, 2008 10:36 AM

## 2010-03-07 NOTE — Progress Notes (Signed)
Summary: Office notes to be faxed  Phone Note From Other Clinic   Caller: [facility] Summary of Call: needs last office notes faxed to [phone] Initial call taken by: Drucie Ip,  November 27, 2007 9:16 AM  Follow-up for Phone Call        faxed Follow-up by: ASHA BENTON LPN,  October 22, 579FGE 9:25 AM

## 2010-03-07 NOTE — Miscellaneous (Signed)
Summary: Labs from [facility]  Clinical Lists Changes  Medications: Changed medication from ALDACTONE 25 MG  TABS (SPIRONOLACTONE) Take 1 tablet by mouth once a day HOLDING to ALDACTONE 25 MG  TABS (SPIRONOLACTONE) Take 1 tablet by mouth once a day - Signed Orders: Added new Test order of [facility] [phone]) - Signed Observations: Added new observation of K SERUM: 4.6 meq/L (07/18/2007 16:09) Added new observation of NA: 138 meq/L (07/18/2007 16:09)  Please call patient and ask to restart her Spironolactone once a day and to come in for a blood check in 1 week.  Do not take any potassium tablets. thanks [facility] MD  July 18, 2007 4:12 PM    Informed patient of above, she expressed understanding.......ASHA BENTON LPN June 12, 579FGE [id number] PM

## 2010-03-07 NOTE — Progress Notes (Signed)
Summary: Referral  Phone Note Call from Patient Call back at Home Phone [phone] Call back at [phone]   Summary of Call: Pt states she has an upcoming appt with Dr. Ermalinda Memos and is needing for [place] to send OV over, as we referred her there. Initial call taken by: Drucie Ip,  August 27, 2007 8:57 AM  Follow-up for Phone Call        also wants to know if she should pick up xrays or if we will send them? Follow-up by: Drucie Ip,  August 27, 2007 11:01 AM  Additional Follow-up for Phone Call Additional follow up Details #1::        09/10/07 with hand specialist Dr. Daylene Katayama on [facility] she made the appt. md wants our records. asked her to come sign a ROI and we will send to that md Additional Follow-up by: Elige Radon RN,  August 27, 2007 5:05 PM

## 2010-03-07 NOTE — Assessment & Plan Note (Signed)
Summary: ears wp   Vital Signs:  Patient Profile:   77 Years Old Female Height:     64 inches (162.56 cm) Weight:      161.1 pounds BMI:     27.75 Temp:     97.8 degrees F Pulse rate:   64 / minute BP sitting:   141 / 77  (left arm)  Pt. in pain?   yes    Location:   L hand    Intensity:   8  Vitals Entered By: Geanie Cooley RN (January 09, 2007 9:14 AM)                  Chief Complaint:  rt. ear draining.  History of Present Illness: Thumb Pain Worsening wants another injection.  Capsacin helps some.  No fever or redness  Left Ear Draining for last 2-3 days.  No pain. Similar to what had before.  No fever.  Hearing loss is unchanged. Saw Dr Johnnye Sima in [place] previously. I reviewed these records  HTN No change in meds.  Does not check regularly. No chest pain or unusual shortness of breath   Procedure L thumb prepped and cleaned.  1 cc solumedrol (20 mg) and 1 cc lidocaine injected at base  Tolerated well.  Felt better afterward.    Medications reviewed and updated in medication list   Current Allergies: ! * TRAMADOL      Physical Exam  Ears:     L TM scarred and obscured by clear cloudy discharge.  No smell or pus or pain with exam or pinnna movement Msk:     Left thumb - ROM painful over mcp joint,  Distal IP joint nontender.  Mild decrease in ROM    Impression & Recommendations:  Problem # 1:  HYPERTENSION, BENIGN SYSTEMIC (ICD-401.1) Will monitor at home.  May need to gingerly increase but risk benefit ratio is very narrow Her updated medication list for this problem includes:    Altace 10 Mg Caps (Ramipril) .Marland Kitchen... Take 1 tablet by mouth once a day    Coreg 25 Mg Tabs (Carvedilol) ..... One tab by mouth two times a day    Aldactone 25 Mg Tabs (Spironolactone) .Marland Kitchen... Take 1 tablet by mouth once a day    Demadex 20 Mg Tabs (Torsemide) ..... Mon-wed-fri   Problem # 2:  OTITIS MEDIA, CHRONIC (ICD-382.9) Assessment: New Recurred.  Treat topically and  follow.   Her updated medication list for this problem includes:    Adprin B 325 Mg Tabs (Aspirin buf(cacarb-mgcarb-mgo)) .Marland Kitchen... Take 1 tablet by mouth once a day  Orders: [place]- Est Level  3 [id number])   Problem # 3:  OSTEOARTHRITIS, MULTI SITES (ICD-715.98) Assessment: Deteriorated L thumb injected.  Continu vicodin and capsacin.  Warned not to overdo movements Her updated medication list for this problem includes:    Adprin B 325 Mg Tabs (Aspirin buf(cacarb-mgcarb-mgo)) .Marland Kitchen... Take 1 tablet by mouth once a day    Vicodin 5-500 Mg Tabs (Hydrocodone-acetaminophen) .Marland Kitchen... 1 by mouth two times a day as needed thumb pain  Orders: [place]- Est Level  3 ([address]213) Injection, small joint- [place] ([address])   Complete Medication List: 1)  Altace 10 Mg Caps (Ramipril) .... Take 1 tablet by mouth once a day 2)  Crestor 20 Mg Tabs (Rosuvastatin calcium) .... Take 1 tablet by mouth once a day 3)  Coreg 25 Mg Tabs (Carvedilol) .... One tab by mouth two times a day 4)  Aldactone 25 Mg Tabs (  Spironolactone) .... Take 1 tablet by mouth once a day 5)  Demadex 20 Mg Tabs (Torsemide) .... Mon-wed-fri 6)  Klor-con M20 20 Meq Tbcr (Potassium chloride crys cr) .... Qwed 7)  Adprin B 325 Mg Tabs (Aspirin buf(cacarb-mgcarb-mgo)) .... Take 1 tablet by mouth once a day 8)  Bl Vitamin C 500 Mg Tabs (Ascorbic acid) .... Once daily 9)  Vitamin E Natural 400 Unit Caps (Vitamin e) .... Take 1 tablet by mouth once a day 10)  Omega-3 350 Mg Caps (Omega-3 fatty acids) .... 1000mg  once daily 11)  Vicodin 5-500 Mg Tabs (Hydrocodone-acetaminophen) .Marland Kitchen.. 1 by mouth two times a day as needed thumb pain 12)  Allegra-d 12 Hour 60-120 Mg Tb12 (Fexofenadine-pseudoephedrine) .Marland Kitchen.. 1 by mouth two times a day prn 13)  Cortisporin 3.5-10000-1 Susp (Neomycin-polymyxin-hc) .... 2-3 drops 4 x a day after wicking out your ear.  1 bottle 14)  Prilosec 10 Mg Cpdr (Omeprazole) .Marland Kitchen.. 1-2 by mouth at bedtime   Patient Instructions: 1)  Please schedule a  follow-up appointment in 2 months. 2)  Measure your BP at a drug store or [employer] and write down and bring in 3)  Use ice on thumb if more pain. If red or really swollen call us 4)  Use ear drop  4x a day after cleaning.  If not better in 2 weeks or if worse call - fever or pain    Prescriptions: PRILOSEC 10 MG  CPDR (OMEPRAZOLE) 1-2 by mouth at bedtime  #30 x 1   Entered and Authorized by:   Talbert Cage MD   Signed by:   Talbert Cage MD on 01/09/2007   Method used:   Electron[facility] to ...       [facility]  [address] [id number]       [address]       [place], [place]  [address]       Ph: [phone] or [phone]       Fax: [phone]   RxID:   [id number] CORTISPORIN 3.5-10000-1  SUSP [facility]) 2-3 drops 4 x a day after wicking out your ear.  1 bottle  #1 x 2   Entered and Authorized by:   Talbert Cage MD   Signed by:   Talbert Cage MD on 01/09/2007   Method used:   Electronically sent to ...       [facility]  [address] [id number]       [place]       [place],   [address]       Ph: [phone] or [phone]       Fax: [phone]   RxID:   [id number]  ]

## 2010-03-07 NOTE — Miscellaneous (Signed)
Summary: HIPAA Restrictions  HIPAA Restrictions   Imported By: Bonner Puna 03/11/2008 16:25:15  _____________________________________________________________________  External Attachment:    Type:   Image     Comment:   External Document

## 2010-03-07 NOTE — Letter (Signed)
Summary: Generic Letter  [facility]  [address]   [place], [place] [address]   Phone: [phone]  Fax: [phone]    08/15/2007  Jodi [name] [address] [place], [place]  [address]  Dear Ms. Console,  Your blood test was normal.   Continue to take the spironolactone but do not take any extra potassium.  Let me know about your hand surgery if you need a referral etc   Sincerely,   Talbert Cage MD [place]  Appended Document: Generic Letter Letter sent to pt via mail    ............................[name],CMA (AAMA)

## 2010-03-07 NOTE — Assessment & Plan Note (Signed)
Summary: FU BP AND HAND/KH   Vital Signs:  Patient Profile:   77 Years Old Female Height:     64 inches (162.56 cm) Weight:      162.1 pounds BMI:     27.92 Temp:     97.3 degrees F Pulse rate:   60 / minute BP sitting:   134 / 69  (left arm)  Pt. in pain?   no  Vitals Entered By: Geanie Cooley RN (December 09, 2006 1:38 PM)                  Chief Complaint:  f/u wrist and labs.  History of Present Illness: L Hand Pain Still very painful at base of L thumb especially with use.  Injection helped for about 1 month.  using ibuprofen for pain 800 mg bid prn Used glucosamine for one month no help.  Used capsacin for a few days but did not last for more than 30 min of pain relief.  No soft tissue swelling or redness.  Wants to try Vicodin for pain.  HYPERTENSION Disease Monitoring   Blood pressure range: range from 118-144/54-78    Chest pain:N   Dyspnea:N Medications   Compliance:Good Side effects   Lightheadedness:N   Edema:N   Medications reviewed    Current Allergies: ! * TRAMADOL      Physical Exam  General:     Well-developed,well-nourished,in no acute distress; alert,appropriate and cooperative throughout examination Msk:     Left thumb - limited range of motion with pain over base.  No soft tissue swelling or redness Extremities:     no edema    Impression & Recommendations:  Problem # 1:  HAND PAIN (ICD-729.5) Assessment: Deteriorated DJD. Will try prolonged trial of capsacin and Vicodin for intermittent pain relief.  May try  reinject in future.  She is not interested in surgery now  Will be visiting [place] to see her Ggrandkids age 25 and 38 months  Orders: [place]- Est  Level 4 [id number])   Problem # 2:  HYPERTENSION, BENIGN SYSTEMIC (ICD-401.1) Assessment: Unchanged Adequate control.   Her updated medication list for this problem includes:    Altace 10 Mg Caps (Ramipril) .Marland Kitchen... Take 1 tablet by mouth once a day    Coreg 25 Mg Tabs  (Carvedilol) ..... One tab by mouth two times a day    Aldactone 25 Mg Tabs (Spironolactone) .Marland Kitchen... Take 1 tablet by mouth once a day    Demadex 20 Mg Tabs (Torsemide) ..... Mon-wed-fri  Orders: [place]- Est  Level 4 ([address])   Problem # 3:  HYPERCHOLESTEROLEMIA (ICD-272.0) Adequate LDL control.   Her updated medication list for this problem includes:    Crestor 20 Mg Tabs (Rosuvastatin calcium) .Marland Kitchen... Take 1 tablet by mouth once a day   Problem # 4:  Preventive Health Care (ICD-V70.0) Discussed colonscopy.  She is not sure she wants it.  Not sure her prognosis with her CHF indicates it.  Problem # 5:  RHINITIS (ICD-477.9) complains of postnasal drip and throat clearing - allegra D used to help will try again  Complete Medication List: 1)  Altace 10 Mg Caps (Ramipril) .... Take 1 tablet by mouth once a day 2)  Crestor 20 Mg Tabs (Rosuvastatin calcium) .... Take 1 tablet by mouth once a day 3)  Coreg 25 Mg Tabs (Carvedilol) .... One tab by mouth two times a day 4)  Aldactone 25 Mg Tabs (Spironolactone) .... Take 1 tablet by mouth once  a day 5)  Demadex 20 Mg Tabs (Torsemide) .... Mon-wed-fri 6)  Klor-con M20 20 Meq Tbcr (Potassium chloride crys cr) .... Qwed 7)  Adprin B 325 Mg Tabs (Aspirin buf(cacarb-mgcarb-mgo)) .... Take 1 tablet by mouth once a day 8)  Bl Vitamin C 500 Mg Tabs (Ascorbic acid) .... Once daily 9)  Vitamin E Natural 400 Unit Caps (Vitamin e) .... Take 1 tablet by mouth once a day 10)  Omega-3 350 Mg Caps (Omega-3 fatty acids) .... 1000mg  once daily 11)  Vicodin 5-500 Mg Tabs (Hydrocodone-acetaminophen) .Marland Kitchen.. 1 by mouth two times a day as needed thumb pain 12)  Allegra-d 12 Hour 60-120 Mg Tb12 (Fexofenadine-pseudoephedrine) .Marland Kitchen.. 1 by mouth two times a day prn   Patient Instructions: 1)  Please schedule a follow-up appointment in 3 months. 2)  Try capsacin  4x a day for 7 days to see if helps with pain 3)  Call if BP > 140/80 regularly 4)  Hydrocortioson by itself on  ear two times a day     Prescriptions: ALLEGRA-D 12 HOUR 60-120 MG TB12 (FEXOFENADINE-PSEUDOEPHEDRINE) 1 by mouth two times a day prn  #60 x 3   Entered and Authorized by:   Talbert Cage MD   Signed by:   Talbert Cage MD on 12/09/2006   Method used:   Elect[facility] to ...       [facility]  [address] [id number]       [place]       [place], [place]  [address]       Ph: [phone] or [phone]       Fax: [phone]   RxID:   [id number] VICODIN 5-500 MG TABS (HYDROCODONE-ACETAMINOPHEN) 1 by mouth two times a day as needed thumb pain  #30 x 3   Entered and Authorized by:   Talbert Cage MD   Signed by:   Talbert Cage MD on 12/09/2006   Method used:   Handwritten   [id number]  ]

## 2010-03-07 NOTE — Assessment & Plan Note (Signed)
Summary: sores on head   Vital Signs:  Patient Profile:   77 Years Old Female Height:     64 inches (162.56 cm) Weight:      157.5 pounds BMI:     27.13 Temp:     97.4 degrees F Pulse rate:   61 / minute BP sitting:   169 / 83  (right arm)  Pt. in pain?   no  Vitals Entered By: Geanie Cooley RN 11/11/07                 Last Flex Sig:  Done. (03/08/1996 12:00:00 AM) Flex Sig Next Due:  Not Indicated Last Hemoccult Result: Done. (03/08/1996 12:00:00 AM) Hemoccult Next Due:  Not Indicated Last PAP:  Done. (11/05/1992 12:00:00 AM) PAP Result Date:  11/10/2007 PAP Result:  Had hysterectormy PAP Next Due:  Not Indicated   PCP:  Talbert Cage MD  Chief Complaint:  sores on head.  History of Present Illness: Sores in head around ears and back of scalp for last few weeks.  has had in past comes and goes. No rash anywhere else.  has not used any medications.  no change in shampoo or hair products but has tried selsun blue. No fever or pain  ROS - as above PMH - Medications reviewed and updated in medication list.  Smoking Status noted in VS form      Current Allergies: ! * TRAMADOL    Risk Factors:  PAP Smear History:     Date of Last PAP Smear:  11/10/2007  PAP Smear History:     Date of Last PAP Smear:  11/10/2007    Results:  Had hysterectormy  Mammogram History:     Date of Last Mammogram:  04/08/2006   Colonoscopy History:     Date of Last Colonoscopy:  03/08/1996   PAP Smear History:     Date of Last PAP Smear:  11/05/1992     Physical Exam  General:     Well-developed,well-nourished,in no acute distress; alert,appropriate and cooperative throughout examination Head:     irritated mildly excoriated areas around ears and at hairline in back.  NT with discharge Skin:     No other suspicious lesions    Impression & Recommendations:  Problem # 1:  SEBORRHEA (ICD-706.3) Assessment: New No signs of allegy or infection Triamcinolone    Orders: [place]- Est Level  3 [id number])   Complete Medication List: 1)  Altace 10 Mg Caps (Ramipril) .... Take 1 tablet by mouth once a day 2)  Crestor 20 Mg Tabs (Rosuvastatin calcium) .... Take 1 tablet by mouth once a day 3)  Coreg 25 Mg Tabs (Carvedilol) .... One tab by mouth two times a day 4)  Aldactone 25 Mg Tabs (Spironolactone) .... Take 1 tablet by mouth once a day 5)  Demadex 20 Mg Tabs (Torsemide) .... One tablet every other day for fluid 6)  Adprin B 325 Mg Tabs (Aspirin buf(cacarb-mgcarb-mgo)) .... Take 1 tablet by mouth once a day 7)  Bl Vitamin C 500 Mg Tabs (Ascorbic acid) .... Once daily 8)  Vitamin E Natural 400 Unit Caps (Vitamin e) .... Take 1 tablet by mouth once a day 9)  Omega-3 350 Mg Caps (Omega-3 fatty acids) .... 1000mg  once daily 10)  Vicodin 5-500 Mg Tabs (Hydrocodone-acetaminophen) .Marland Kitchen.. 1 by mouth two times a day as needed thumb pain 11)  Allegra-d 12 Hour 60-120 Mg Tb12 (Fexofenadine-pseudoephedrine) .Marland Kitchen.. 1 by mouth two times a day prn 12)  Prilosec  10 Mg Cpdr (Omeprazole) .Marland Kitchen.. 1-2 by mouth at bedtime 13)  Calcium Carbonate-vitamin D 600-400 Mg-unit Tabs (Calcium carbonate-vitamin d) .... 2 daily 14)  Ambien 10 Mg Tabs (Zolpidem tartrate) .... 1/2 -1 by mouth at bedtime as needed take only rarely 15)  Cipro Hc 0.2-1 % Susp (Ciprofloxacin-hydrocortisone) .... 3 drops in ear two times a day 1 bottle 16)  Triamcinolone Acetonide 0.1 % Crea (Triamcinolone acetonide) .... Apply two times a day to sore areas in scalp use for 2 weeks then taper down - 30 gram  Other Orders: Influenza Vaccine MCR [id number])   Patient Instructions: 1)  Please schedule a follow-up appointment as needed. 2)  Use the cream two times a day for next 10-14 days then taper down to once a day and then as needed  3)  Call if not better in 10 days or if gets worse or gets painful or you have a fever 4)  Schedule a colonoscopy to help detect colon cancer.   Prescriptions: TRIAMCINOLONE  ACETONIDE 0.1 % CREA (TRIAMCINOLONE ACETONIDE) apply two times a day to sore areas in scalp use for 2 weeks then taper down - 30 gram  #1 x 2   Entered and Authorized by:   Talbert Cage MD   Signed by:   Talbert Cage MD on 11/10/2007   Method used:   Electronically to        [facility]. [id number]* (retail)       [address]       [place], [place]  [address]       Ph: [phone] or [phone]       Fax: [phone]   RxID:   [id number]  ]  Influenza Vaccine    Vaccine Type: Fluvax MCR    Site: left deltoid    Mfr: [employer]    Dose: 0.5 ml    Route: IM    Given by: Geanie Cooley RN    Exp. Date: 08/04/2008    Lot #: [id number]    VIS given: 08/29/06 version given November 10, 2007.  Appended Document: colonoscopy appt. Colonoscopy appt. scheduled with Dr. Collene Mares, Oct. 22 at 2:15.  Message left on pt  answering machine. OV and lab reports faxed to MD  Clinical Lists Changes

## 2010-03-07 NOTE — Consult Note (Signed)
Summary: [facility]   Imported By: Raymond Gurney 08/18/2008 12:13:07  _____________________________________________________________________  External Attachment:    Type:   Image     Comment:   External Document

## 2010-03-07 NOTE — Consult Note (Signed)
Summary: [facility]   Imported By: Bonner Puna 05/06/2008 16:14:49  _____________________________________________________________________  External Attachment:    Type:   Image     Comment:   External Document

## 2010-03-07 NOTE — Consult Note (Signed)
Summary: [facility]   Imported By: Samara Snide 12/04/2007 10:14:03  _____________________________________________________________________  External Attachment:    Type:   Image     Comment:   External Document

## 2010-03-07 NOTE — Letter (Signed)
Summary: Wellness Visit Letter  [facility]  [address]   [place], [place] [address]   Phone: [phone]  Fax: [phone]    07/06/2009  Jodi [name] [address] [place], [place]  [address]  Dear Ms. Mcdougall,  We are happy to let you know that since you are covered under Medicare you are able to have a FREE visit at the [facility] to discuss your HEALTH. This is a new benefit for Medicare.  There will be no co-payment.  At this visit you will meet with Tereasa Coop an expert in wellness and the nurse practitioner at our clinic.  At this visit we will discuss ways to keep you healthy and feeling well.  This visit will not replace your regular doctor visit and we cannot refill medications.  We may schedule future blood work, give shots if needed, or schedule tests to look for hidden problems.   You will need to plan to be here at least one hour to talk about your medical history, your current status, review all of your medications, and discuss your future plans for your health.  This information will be entered into your record for your doctor to have and review.  If you are interested in staying healthy, this type of visit can help.  Please call the office at: [phone], to schedule a "Medicare Wellness Visit".  The day of the visit you should bring in all of your medications, including any vitamins, herbs, over the counter products you take.  Make a list of all the other doctors that you see, so we know who they are. If you have any other health documents please bring them.  We look forward to helping you stay healthy.      Sincerely,   Tereasa Coop NP  Appended Document: Wellness Visit Letter mailed.

## 2010-03-07 NOTE — Assessment & Plan Note (Signed)
Summary: hand wp   Vital Signs:  Patient Profile:   77 Years Old Female Height:     64 inches (162.56 cm) Weight:      160.6 pounds (73.00 kg) BMI:     27.67 Temp:     97.9 degrees F (36.61 degrees C) Pulse rate:   68 / minute BP sitting:   153 / 75  (left arm)  Pt. in pain?   yes    Location:   Left hand    Intensity:   8  Vitals Entered By: Dalbert Mayotte (August 06, 2006 3:46 PM)                Chief Complaint:  Left hand pain/joint injection.  History of Present Illness: Painful Hands Left thumb at base still very painful.  Could not take ultram made her sick.  Capsacin helps some.  Would like injection.  No redness or swelling  HTN is not taking at home.  Was good at her recent cardiac test  Reviewed the patient's medications in Dr Brantley Stage.  They are updated and current.   Procedure Left thmb mcp injection.   Cleaned and prepped in sterile fashion.  Injected 1 cc of 20 mg celestone plus 1% lidocaine at base with traction on thumb. Tolerated well        Physical Exam  Msk:     Left thumb - limited range of motion with pain.  Improved pain after injection    Impression & Recommendations:  Problem # 1:  OSTEOARTHRITIS, MULTI SITES (ICD-715.98) Left thumb - see how does after injection.  Warned may need replacement in future Orders: [facility]- Est Level  3 [id number]) Injection, small joint- [place] ([address])   Problem # 2:  HYPERTENSION, BENIGN SYSTEMIC (ICD-401.1) Not great control but may be due to pain.   Follow systolic values.  Have her try to take at home Orders: [facility]- Est Level  3 [id number])   Problem # 3:  OTITIS MEDIA, CHRONIC (ICD-382.9) She made appointment to see Dr Johnnye Sima in [place] referred by Constance Holster   Patient Instructions: 1)  Please schedule a follow-up appointment in 4 months. 2)  Use ice for 20 miin 3-4 x a day for next 1-2 days 3)  Dont use the pepper cream for next 2 days then can use it 4)  If your thumb becomes very red or very hot call us 5)   vicodin 1 table 4 x a day as you need it.

## 2010-03-07 NOTE — Progress Notes (Signed)
Summary: [facility] request  Phone Note Call from Patient Call back at Home Phone [phone]   Summary of Call: is wanting to be seen for her hands hurting - states she can barely use them Initial call taken by: Drucie Ip,  June 03, 2006 9:44 AM  Follow-up for Phone Call        appt with pcp made for next day. using ibu with some relief Follow-up by: Elige Radon RN,  June 03, 2006 10:11 AM

## 2010-03-07 NOTE — Consult Note (Signed)
Summary: [facility]   Imported By: Audie Clear 01/12/2009 10:41:48  _____________________________________________________________________  External Attachment:    Type:   Image     Comment:   External Document

## 2010-03-07 NOTE — Progress Notes (Signed)
Summary: refill  Phone Note Refill Request Call back at Home Phone [phone] Message from:  Patient  Refills Requested: Medication #1:  ALTACE 10 MG  CAPS Take 1 tablet by mouth once a day Initial call taken by: Audie Clear,  December 23, 2009 11:02 AM  Follow-up for Phone Call        pt has 1 left and states that pharm has been sending request since Wed. Follow-up by: Audie Clear,  December 23, 2009 1:39 PM  Additional Follow-up for Phone Call Additional follow up Details #1::        Need name of pharmacy?   Pls call in 30 with 6 refills thanks [facility] Additional Follow-up by: Talbert Cage MD,  December 23, 2009 2:09 PM    Prescriptions: ALTACE 10 MG  CAPS (RAMIPRIL) Take 1 tablet by mouth once a day  #30 Each x 6   Entered by:   Marcell Barlow RN   Authorized by:   Talbert Cage MD   Signed by:   Marcell Barlow RN on 12/23/2009   Method used:   Electronically to        [id number] (retail)       [place], [place]  [address]       Ph: [phone]       Fax: [phone]   RxID:   [id number]   Appended Document: refill patient notified

## 2010-03-07 NOTE — Assessment & Plan Note (Signed)
Summary: pain in hand/el   Vital Signs:  Patient Profile:   77 Years Old Female Weight:      165 pounds Temp:     97.8 degrees F Pulse rate:   63 / minute BP sitting:   194 / 76  Pt. in pain?   yes    Location:   left hand    Intensity:   8  Vitals Entered By: Christen Bame CMA (July 18, 2006 10:46 AM)                Serial Vital Signs/Assessments:  Time      Position  BP       Pulse  Resp  Temp     By 11:30               182/78                         MARSHALL CHAMBLISS MD   Chief Complaint:  Pain in left hand.  History of Present Illness: Painful Hands Left thumb at base still very painful.  Better with ibuprofen but taking 800 mg tid.   No numbness or tingling on left hand.    Right hand still tingling and burning but pain is much less.  Mainly first three fingers Can happen anytime   No rednes jt swelling.  No focal tenderness.  No weakness or dropping objects   HYPERTENSION Disease Monitoring   Blood pressure range: does not take at home has trouble hearing   Chest pain:N   Dyspnea:N Medications   Compliance:Yes every day Side effects   Lightheadedness:N   Edema:N  Breast breaking out again as before worse with heat  Reviewed the patient's medications in Dr Brantley Stage.  They are updated and current.         Physical Exam  General:     Well-developed,well-nourished,in no acute distress; alert,appropriate and cooperative throughout examination Extremities:     Left hand - tender over base of thumb no redness or crepitus Right hand -   FROM without pain.  No jt pain or deformity.  Normal strenth and muscle mass. FROM at wrists without pain.  No change in symptoms with neck ROM.  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  Xray.  Decrease ibuprofen and try tramadol. May need injection  Right hand - probable carpal tunnel.  She would prefer just to watch no workup or  treatment now  Orders: Diagnostic X-Ray/Fluoroscopy (Diagnostic X-Ray/Flu) [place]- Est  Level 4 [id number])   Problem # 2:  HYPERTENSION, BENIGN SYSTEMIC (ICD-401.1) Not well controlled today probably due to ibuprofen and pain.  Monitor Orders: Comp Met-[facility] [id number]) [place]- Est  Level 4 [id number])   Problem # 3:  FUNGAL DERMATITIS (ICD-111.9) under breasts - use clotrimazole and low heat hair dryer  Problem # 5:  Preventive Health Care (ICD-V70.0)   Patient Instructions: 1)  Please schedule a follow-up appointment in 3 months. 2)  Tramadol (Ultram) 1-2 three times a day may use with Tylenol or low doses of Ibuprofen 3)  Capsacin cream (Zostrix) rub on joint four times a day. do not get in eyes 4)  Clotrimazole cream two times a day  5)  Check your Blood Pressure regularly. If it is above: 140/90 regularly you should make an appointment.  Preventive Care Screening  Colonoscopy:    Next Due:  03/2006  

## 2010-03-07 NOTE — Progress Notes (Signed)
Summary: ICD 9  Phone Note Other Incoming Call back at [phone]   Call placed by: [facility] Summary of Call: Is needing ICD 9 code for wrist splint. Initial call taken by: Drucie Ip,  July 30, 2007 11:10 AM  Follow-up for Phone Call        Per office note where wrist splint was mentioned - ICD 9 code that was entered was 729.5 for hand pain.  Spoke with Candice and gave her this ICD9 for the hand splint. Follow-up by: AMY MARTIN RN,  July 30, 2007 11:44 AM

## 2010-03-07 NOTE — Assessment & Plan Note (Signed)
Summary: vomitting/weak/wp   Vital Signs:  Patient Profile:   77 Years Old Female Height:     64 inches (162.56 cm) Weight:      158.9 pounds Temp:     97.6 degrees F Pulse rate:   61 / minute BP sitting:   101 / 68  (left arm)  Pt. in pain?   no  Vitals Entered By: Arnette Schaumann RN (Jun 17, 2007 10:49 AM)                  PCP:  Talbert Cage MD  Chief Complaint:  vomiting since last tuesday after starting septra and feeling nauseated.  History of Present Illness: Hx  HTN, Hypothyroid, systolic CHF EF< [id number]  Treated for staph otitis 06/09/07 with bactrim x 7 days.  Had + clx 05/09/07  Presents for vomitting/ dehydration-   extensive history of otits media s/p tubes and attempted TM patch per Dr Constance Holster ENT ( 04/2007).  Has seen Dr Johnnye Sima with ID previously.  Has a history of MSSA infection that responded to a combo of keflex x 2 mos and cortisporin ear drops.  No preceding URTI.  Hearing is decreased chronically.  Seen PCP 1 week ago and placed on Bactrim ds two times a day.  States nonbilious nonbloddy emesis 2-3 times daily since starting Bactrim.  No fever, no diarrhea. Started taking med with food and this helped but she began vomitting again.    Hypotensive- still taking alot of BP meds for CHF.  with decreased by mouth intake.  Hyperkalemia- 5.4 on CMET 06/09/07.  On ACE and aldactone and K - supplement per med list.  She did not bring in bottles today, but states she is taking everythin on the list.        Current Allergies: ! * TRAMADOL      Physical Exam  General:     alert appears fatigued and flushed Head:     Normocephalic and atraumatic without obvious abnormalities. No apparent alopecia or balding. Ears:     no signs of infection on either TM.  No exudate.  Sar formation noted with dried blood on right TM.  No signs of mastoiditis Nose:     External nasal examination shows no deformity or inflammation. Nasal mucosa are pink and moist without lesions or  exudates. Mouth:     Oral mucosa and oropharynx without lesions or exudates.  Teeth in good repair. Neck:     No deformities, masses, or tenderness noted.  no adenopathy Lungs:     Normal respiratory effort, chest expands symmetrically. Lungs are clear to auscultation, no crackles or wheezes. Heart:     Normal rate and regular rhythm. S1 and S2 normal without gallop, murmur, click, rub or other extra sounds. Skin:     increased skin turgor on hands 3 sec atleast    Impression & Recommendations:  Problem # 1:  OTITIS MEDIA, CHRONIC (ICD-382.9) Will stop Bactrim as ears appear without infection today and pt is on last day of treatment.  Recommended f/u with Dr. Constance Holster, pt reluctant to this idea.   Her updated medication list for this problem includes:    Adprin B 325 Mg Tabs (Aspirin buf(cacarb-mgcarb-mgo)) .Marland Kitchen... Take 1 tablet by mouth once a day    Septra Ds 800-160 Mg Tabs (Sulfamethoxazole-trimethoprim) .Marland Kitchen... 2  by mouth twice daily for 7 days  Orders: [facility]- Est  Level 4 ([address]) [facility] [id number])   Problem # 2:  VOMITING (ICD-787.03) Likely secondary to  septra side effect.  See above.  Normal stool and urine output less concern for infectious etiology but gave clear warning signs  to report to care sooner if fever, diarrhea, or worsening emesis.  Gave small dose amount of anit-emetic to help ( phenergan ) #6.   Orders: [facility] [id number])   Problem # 3:  DEHYDRATION (ICD-276.51) See above likelt sec to vomitng.  Encourage by mouth ( see detailed pt instructions). Orders: [place]- Est  Level 4 ([address]) CBC-[facility] [id number]) Basic Met-[facility] [id number])   Problem # 4:  HYPERKALEMIA (ICD-276.7) K. 5.4 on BMET 5/4 will check today to make sure no worse.  HOLD Demadex, ACE, Aldactone and K supplement until labs back.  Check EKG.   Orders: [facility] [id number]) 12 Lead EKG (12 Lead EKG)   Complete Medication List: 1)  Altace 10 Mg Caps (Ramipril) .... Take 1 tablet by mouth  once a day 2)  Crestor 20 Mg Tabs (Rosuvastatin calcium) .... Take 1 tablet by mouth once a day 3)  Coreg 25 Mg Tabs (Carvedilol) .... One tab by mouth two times a day 4)  Aldactone 25 Mg Tabs (Spironolactone) .... Take 1 tablet by mouth once a day 5)  Demadex 20 Mg Tabs (Torsemide) .... One tablet every other day for fluid 6)  Klor-con M20 20 Meq Tbcr (Potassium chloride crys cr) .... One once a week 7)  Adprin B 325 Mg Tabs (Aspirin buf(cacarb-mgcarb-mgo)) .... Take 1 tablet by mouth once a day 8)  Bl Vitamin C 500 Mg Tabs (Ascorbic acid) .... Once daily 9)  Vitamin E Natural 400 Unit Caps (Vitamin e) .... Take 1 tablet by mouth once a day 10)  Omega-3 350 Mg Caps (Omega-3 fatty acids) .... 1000mg  once daily 11)  Vicodin 5-500 Mg Tabs (Hydrocodone-acetaminophen) .Marland Kitchen.. 1 by mouth two times a day as needed thumb pain 12)  Allegra-d 12 Hour 60-120 Mg Tb12 (Fexofenadine-pseudoephedrine) .Marland Kitchen.. 1 by mouth two times a day prn 13)  Prilosec 10 Mg Cpdr (Omeprazole) .Marland Kitchen.. 1-2 by mouth at bedtime 14)  Septra Ds 800-160 Mg Tabs (Sulfamethoxazole-trimethoprim) .... 2  by mouth twice daily for 7 days 15)  Calcium Carbonate-vitamin D 600-400 Mg-unit Tabs (Calcium carbonate-vitamin d) .... 2 daily 16)  Ambien 10 Mg Tabs (Zolpidem tartrate) .... 1/2 -1 by mouth at bedtime as needed take only rarely 17)  Promethazine Hcl 12.5 Mg Tabs (Promethazine hcl) .Marland Kitchen.. 1 tab by mouth every 8 hrs as needed for vomit   Patient Instructions: 1)  Follow up in 2-3 days to make sure that you are feeling better. 2)  Stop Bactrim.  Your ear does not seem to be infected today. 3)  You seem to be dehydrated from vomitting as well. 4)  Attempt oral fluids like gatorade/ ginger ale as well.  Small meals as tolerated. 5)  We will give a few nausea (phenergan pills) to take. 6)  STOP Aldactone, Ramipril, Potassium pill and demadex until further notice.  7)  If you continue to vomit, fever, or worsening dehydration report to emergency  room.     Prescriptions: PROMETHAZINE HCL 12.5 MG  TABS (PROMETHAZINE HCL) 1 tab by mouth every 8 hrs as needed for vomit  #6 x 0   Entered and Authorized by:   Barth Kirks MD   Signed by:   Barth Kirks MD on 06/17/2007   Method used:   Electron[facility] to ...       [facility]  [address] [id number]  [place]       [place], [place]  [address]       Ph: [phone] or [phone]       Fax: [phone]   RxID:   [id number]  ]

## 2010-03-07 NOTE — Letter (Signed)
Summary: Generic Letter  [facility]  [address]   [place], [place] [address]   Phone: [phone]  Fax: [phone]    06/10/2007  Jodi [name] [address] [place], [place]  [address]  Dear Ms. Albergo,   All your blood tests were good.  Hope your ear is improving.  Enjoy the Spring   Sincerely,   [place]  Appended Document: Generic Letter Letter sent to pt via mail    ............................[name],CMA (AAMA)

## 2010-03-07 NOTE — Consult Note (Signed)
Summary: [facility] By: Drucie Ip 09/18/2007 14:15:29  _____________________________________________________________________  External Attachment:    Type:   Image     Comment:   External Document

## 2010-03-07 NOTE — Progress Notes (Signed)
Summary: phn msg  Phone Note Call from Patient Call back at [facility] Phone [phone]   Caller: Patient Summary of Call: Pt found out that [employer] would pay for shingles vaccine.  Does she need to schedule appt to have it? Initial call taken by: Raymond Gurney,  July 07, 2009 9:47 AM  Follow-up for Phone Call        Tammy, She just needs a nurse visit Follow-up by: Christen Bame CMA,  July 07, 2009 10:44 AM  Additional Follow-up for Phone Call Additional follow up Details #1::        LVM that all she needed was nurse visit for shingles vaccine. Additional Follow-up by: Raymond Gurney,  July 08, 2009 9:33 AM

## 2010-03-07 NOTE — Miscellaneous (Signed)
  Clinical Lists Changes  Problems: Removed problem of ROTATOR CUFF TENDONITIS (ICD-726.10) Removed problem of ROSACEA (ICD-695.3)

## 2010-03-07 NOTE — Progress Notes (Signed)
Summary: pls call  Phone Note Call from Patient Call back at Home Phone [phone]   Caller: Patient Summary of Call: pt called back Initial call taken by: Audie Clear,  November 23, 2008 9:35 AM  Follow-up for Phone Call        Spoke with her told to stop K completely and to come in for blood tests in 2 weeks Follow-up by: Talbert Cage MD,  November 23, 2008 9:39 AM

## 2010-03-07 NOTE — Progress Notes (Signed)
Summary: status of hospital visit  Phone Note Call from Patient   Caller: Daughter Reason for Call: Talk to Doctor Summary of Call: pt is checking to see if the doctor was still coming over to the hospital to see her, she sts she spoke with dr. Erin Hearing early this am, pts daughter will be calling again in 1-2 hrs to check status Initial call taken by: ERIN LEVAN,  June 04, 2006 1:55 PM  Follow-up for Phone Call        Never spoke with Ms Garciagarcia today Follow-up by: Talbert Cage MD,  June 04, 2006 2:11 PM  Additional Follow-up for Phone Call Additional follow up Details #1::        Sawtoday in clinic doing ok Additional Follow-up by: Talbert Cage MD,  June 04, 2006 5:13 PM

## 2010-03-07 NOTE — Assessment & Plan Note (Signed)
Summary: painful hands   Vital Signs:  Patient Profile:   77 Years Old Female Weight:      162.5 pounds Temp:     98.2 degrees F Pulse rate:   65 / minute BP sitting:   166 / 80  (left arm)  Pt. in pain?   yes    Location:   hands    Intensity:   7  Vitals Entered By: Arnette Schaumann RN (June 04, 2006 3:57 PM)                Chief Complaint:  hand pain and f/u ED.  History of Present Illness: Painful Hand Left thumb at base has been hurting on off for long time.  Betterwith advil and rest.  No recent injury or redness or swelling.  No numbness or tingling on left hand Right hand all fingers get tingling and burning and hurt severely.  Can happen anytime but worse at night and has to shake.  Was very bad when swollen one week ago.  No rednes jt swelling.  No focal tenderness.  No recent change in weight or diet.  No rashes or other jt swelling.  Was in hospital ER earlier to day for feeling faint and had vomited yesterday.  All blood work was ok and feels well now.  No CP or SOB.        Risk Factors:  Tobacco use:  never    Physical Exam  General:     Well-developed,well-nourished,in no acute distress; alert,appropriate and cooperative throughout examination Extremities:     Left hand - tender over base of thumb no redness or crepitus Right hand - no tinels or phalens.  FROM without pain.  No jt pain or deformity.  Normal strenght and muscle mass. FROM at wrists without pain Skin:     No rashes    Impression & Recommendations:  Problem # 1:  HAND PAIN (ICD-729.5) Assessment: New Patient's presentation most consistent with:  DJD in left thumb and carpal tunnel in Right.     Differential: No jt involvement on the Right  Prognosis/Plan:  Trial of splint and NSAIDs.  If not helping may need injection or EMGs.   Orders: [place]- Est Level  3 ([address]) Brace, wrist- [place] (00000)   Problem # 2:  OTITIS MEDIA, CHRONIC (ICD-382.9) Seeing Dr Constance Holster still  infected   Patient Instructions: 1)  Please schedule a follow-up appointment as needed if pain is not better in 3-4 weeks 2)  Wear splint all the time for 1-2 weeks and then can slowly stop but usually wear at night 3)  Can take 2-3 advil as needed three times a day for pain 4)  Avoid salty foods

## 2010-03-07 NOTE — Progress Notes (Signed)
Summary: Requesting x-ray results  Phone Note Call from Patient Call back at Home Phone [phone]   Reason for Call: Talk to Nurse Summary of Call: pt is requesting her x-ray results Initial call taken by: ERIN LEVAN,  July 24, 2006 9:14 AM  Follow-up for Phone Call        Xray was normal.  Told to continue treatment and if not better may need injection  Her ear is stil draining Dr Constance Holster did not give any antibiotics she would like some callled in.  Last was on Ciprodex  [facility] [address] [facility] Follow-up by: Talbert Cage MD,  July 25, 2006 2:57 PM

## 2010-03-07 NOTE — Assessment & Plan Note (Signed)
Summary: new pt referral Right ear staph   Chief Complaint:  new referral  Dr Constance Holster  Right ear staph.  History of Present Illness: 77 yo F with recurrent otitis media. had surgery on ears ( and had repair of holes in TMs). Then had culture done on ears and was found to have staph (MSSA 05-23-06).  No fever or chills. No further drainage from ears. Has been using an antibiotic ear drops and had some improvement.  States previous cirpodex did not help.    Current Allergies: ! * TRAMADOL   Family History:    Grandson with DM  Social History:    Lives with son; No tobacco; [name] daughter [id number] LPN    Alcohol use-no   Risk Factors:  Tobacco use:  never Alcohol use:  no  Colonoscopy History:    Date of Last Colonoscopy:  03/08/1996  Mammogram History:    Date of Last Mammogram:  04/08/2006  PAP Smear History:    Date of Last PAP Smear:  11/05/1992   Review of Systems       nl bm, nl urination, occasional LE edema.    Vital Signs:  Patient Profile:   77 Years Old Female Height:     64 inches (162.56 cm) Weight:      158.0 pounds Temp:     98.0 degrees F oral Pulse rate:   63 / minute BP sitting:   139 / 80  (left arm)  Pt. in pain?   no  Vitals Entered By: Orland Mustard RN (August 19, 2006 11:47 AM)              Is Patient Diabetic? No  Does patient need assistance? Functional Status Self care Ambulation Normal   Physical Exam  General:     alert, well-developed, and well-nourished.   Eyes:     pupils equal, pupils round, and pupils reactive to light.   Ears:     L ear- cerrumen, irritation of TM, mild yellow d/c behind TM R ear- at 6 o'clock position there is an Os with yellow-green d/c. mild-mod irritation of canal with some d/c.  Mouth:     pharynx pink and moist and no exudates.   Neck:     no masses.   Lungs:     normal respiratory effort and normal breath sounds.   Heart:     normal rate, regular rhythm, and Grade  3 /6 systolic  ejection murmur.   Abdomen:     soft, non-tender, and normal bowel sounds.   Extremities:     no edema    Impression & Recommendations:  Problem # 1:  OTITIS MEDIA, CHRONIC (ICD-382.9) will give her samples of Keflex to try to irradicate this, decrease her d/c. THese infections are very difficult to cure. Fortunately she has MSSA and not MRSA.  she will reurn to clinic in 1 month to asses her sx while on medications.   Her updated medication list for this problem includes:    Adprin B 325 Mg Tabs (Aspirin buf(cacarb-mgcarb-mgo)) .Marland Kitchen... Take 1 tablet by mouth once a day    Keflex 500 Mg Cap (Cephalexin) .Marland Kitchen... Take 1 capsule by mouth two times a day   CC; Dr Azzie Roup, Dr Elie Goody  Medications Added to Medication List This Visit: 1)  Altace 10 Mg Caps (Ramipril) .... Take 1 tablet by mouth once a day 2)  Crestor 20 Mg Tabs (Rosuvastatin calcium) .... Take 1 tablet by mouth once a  day 3)  Coreg 25 Mg Tabs (Carvedilol) .... Two tab by mouth two times a day 4)  Aldactone 25 Mg Tabs (Spironolactone) .... Take 1 tablet by mouth once a day 5)  Demadex 20 Mg Tabs (Torsemide) .... Mon-wed-fri 6)  Klor-con M20 20 Meq Tbcr (Potassium chloride crys cr) .... Qwed 7)  Adprin B 325 Mg Tabs (Aspirin buf(cacarb-mgcarb-mgo)) .... Take 1 tablet by mouth once a day 8)  Bl Vitamin C 500 Mg Tabs (Ascorbic acid) .... Once daily 9)  Vitamin E Natural 400 Unit Caps (Vitamin e) .... Take 1 tablet by mouth once a day 10)  Omega-3 350 Mg Caps (Omega-3 fatty acids) .... 1000mg  once daily 11)  Keflex 500 Mg Cap (Cephalexin) .... Take 1 capsule by mouth two times a day   Patient Instructions: 1)  Please schedule a follow-up appointment in 1 month.   ] Prescriptions: KEFLEX 500 MG CAP (CEPHALEXIN) Take 1 capsule by mouth two times a day  #56 x 0   Entered and Authorized by:   Bobby Rumpf MD   Signed by:   Bobby Rumpf MD on 08/22/2006   Method used:   Samples Given   RxID:    [id number]

## 2010-03-07 NOTE — Progress Notes (Signed)
Summary: Triage  Phone Note Call from Patient Call back at Home Phone [phone]   Reason for Call: Talk to Nurse Summary of Call: requesting to speak with RN, pt has been vomitting and can hardly walk, offered wi appt but sts pt can't just come up here and sit around to wait. Initial call taken by: Samara Snide,  Jun 17, 2007 8:33 AM  Follow-up for Phone Call        no answer Follow-up by: Elige Radon RN,  Jun 17, 2007 8:44 AM  Additional Follow-up for Phone Call Additional follow up Details #1::        left message with a man who agreed to have her call me back when she was finished in bathroom Additional Follow-up by: Elige Radon RN,  Jun 17, 2007 8:56 AM    Additional Follow-up for Phone Call Additional follow up Details #2::    c/o n & v since she went on septra. c/o being very weak. has been drinking a soda. instructed to take small sips q 10 minutes as tolerated. has appt at 10:45am today. someone is driving her Follow-up by: Elige Radon RN,  Jun 17, 2007 9:01 AM

## 2010-03-07 NOTE — Miscellaneous (Signed)
    Preventive Care Screening  Mammogram:    Date:  04/08/2006    Next Due:  04/2008    Results:  normal

## 2010-03-07 NOTE — Assessment & Plan Note (Signed)
Summary: f/up/flu shot,tcb   Vital Signs:  Patient profile:   77 year old female Height:      64 inches Weight:      164.25 pounds BMI:     28.30 BSA:     1.80 Temp:     97.9 degrees F Pulse rate:   42 / minute BP sitting:   135 / 82  Vitals Entered By: Talbert Cage MD (November 23, 2009 9:08 AM) CC: f/u and flu shot Is Patient Diabetic? No Pain Assessment Patient in pain? no        Primary Care Provider:  Talbert Cage MD  CC:  f/u and flu shot.  History of Present Illness: Current Problems:   HYPERTENSION, BENIGN SYSTEMIC (ICD-401.1) Disease Monitoring   Blood pressure range:    not checking at home   Chest pain: N     Dyspnea:N Medications   Compliance: daily knows alll her meds   Lightheadedness:rarely feel lightheaded with quick standing resolves if she waits     Edema:N Prevention   Exercise: walks regularly    Salt restriction:Y  HYPERCHOLESTEROLEMIA (ICD-272.0) no right upper quadrant pain or jaundice  CHF - EJECTION FRACTION < 50% (ICD-428.22) no orthopnea or leg swelling.  Can walk to store without shortness of breath.     ROS - as above PMH - Medications reviewed and updated in medication list.  Smoking Status noted in VS form      Habits & Providers  Alcohol-Tobacco-Diet     Tobacco Status: never     Passive Smoke Exposure: no  Current Medications (verified): 1)  Altace 10 Mg  Caps (Ramipril) .... Take 1 Tablet By Mouth Once A Day 2)  Crestor 20 Mg  Tabs (Rosuvastatin Calcium) .... Take 1 Tablet By Mouth Once A Day 3)  Coreg 25 Mg  Tabs (Carvedilol) .... One Tab By Mouth Two Times A Day 4)  Aldactone 25 Mg  Tabs (Spironolactone) .... Take 1 Tablet By Mouth Once A Day 5)  Demadex 20 Mg  Tabs (Torsemide) .... One Tablet Every Other Day For Fluid 6)  Adprin B 325 Mg  Tabs (Aspirin Buf(Cacarb-Mgcarb-Mgo)) .... Take 1 Tablet By Mouth Once A Day 7)  Bl Vitamin C 500 Mg  Tabs (Ascorbic Acid) .... Once Daily 8)  Vitamin E Natural 400  Unit  Caps (Vitamin E) .... Take 1 Tablet By Mouth Once A Day 9)  Omega-3 350 Mg  Caps (Omega-3 Fatty Acids) .... 1000mg  Two Times A Day 10)  Vicodin 5-500 Mg Tabs (Hydrocodone-Acetaminophen) .Marland Kitchen.. 1 By Mouth Two Times A Day As Needed For Shoulder Pain 11)  Prilosec 10 Mg  Cpdr (Omeprazole) .Marland Kitchen.. 1-2 By Mouth At Bedtime 12)  Calcium Carbonate-Vitamin D 600-400 Mg-Unit  Tabs (Calcium Carbonate-Vitamin D) .... 2 Daily 13)  Ambien 5 Mg Tabs (Zolpidem Tartrate) .Marland Kitchen.. 1 Tablet Occasionally At Pm For Sleep 14)  Zetia 10 Mg Tabs (Ezetimibe) .Marland Kitchen.. 1 By Mouth Daily  Allergies: 1)  ! * Tramadol 2)  ! Septra  Physical Exam  General:  Well-developed,well-nourished,in no acute distress; alert,appropriate and cooperative throughout examination.  Able to get up and move around office without problems  Lungs:  Normal respiratory effort, chest expands symmetrically. Lungs are clear to auscultation, no crackles or wheezes. Heart:  Grade 3/6 sys mumur bradycardia Extremities:  no edema    Impression & Recommendations:  Problem # 1:  HYPERTENSION, BENIGN SYSTEMIC (ICD-401.1)  well controlled   Her updated medication list for this problem includes:  Altace 10 Mg Caps (Ramipril) .Marland Kitchen... Take 1 tablet by mouth once a day    Coreg 25 Mg Tabs (Carvedilol) ..... One tab by mouth two times a day    Aldactone 25 Mg Tabs (Spironolactone) .Marland Kitchen... Take 1 tablet by mouth once a day    Demadex 20 Mg Tabs (Torsemide) ..... One tablet every other day for fluid  Orders: [place]- Est  Level 4 [id number])  Problem # 2:  HYPERCHOLESTEROLEMIA (ICD-272.0)  well controlled.  Check again in 6 months Her updated medication list for this problem includes:    Crestor 20 Mg Tabs (Rosuvastatin calcium) .Marland Kitchen... Take 1 tablet by mouth once a day    Zetia 10 Mg Tabs (Ezetimibe) .Marland Kitchen... 1 by mouth daily  Orders: [place]- Est  Level 4 [id number])  Problem # 3:  CHF - EJECTION FRACTION < 50% (ICD-428.22)  Remarkably asymptomatic.  On all preventive  drugs and tolerating well Her updated medication list for this problem includes:    Altace 10 Mg Caps (Ramipril) .Marland Kitchen... Take 1 tablet by mouth once a day    Coreg 25 Mg Tabs (Carvedilol) ..... One tab by mouth two times a day    Aldactone 25 Mg Tabs (Spironolactone) .Marland Kitchen... Take 1 tablet by mouth once a day    Demadex 20 Mg Tabs (Torsemide) ..... One tablet every other day for fluid    Adprin B 325 Mg Tabs (Aspirin buf(cacarb-mgcarb-mgo)) .Marland Kitchen... Take 1 tablet by mouth once a day  Orders: [place]- Est  Level 4 (99214)  Problem # 4:  ANEMIA (ICD-285.9) resolved   Problem # 5:  HYPOTHYROIDISM, UNSPECIFIED (ICD-244.9) resolved.  Had transient change in thyroid tests in distant past with severe illness   Complete Medication List: 1)  Altace 10 Mg Caps (Ramipril) .... Take 1 tablet by mouth once a day 2)  Crestor 20 Mg Tabs (Rosuvastatin calcium) .... Take 1 tablet by mouth once a day 3)  Coreg 25 Mg Tabs (Carvedilol) .... One tab by mouth two times a day 4)  Aldactone 25 Mg Tabs (Spironolactone) .... Take 1 tablet by mouth once a day 5)  Demadex 20 Mg Tabs (Torsemide) .... One tablet every other day for fluid 6)  Adprin B 325 Mg Tabs (Aspirin buf(cacarb-mgcarb-mgo)) .... Take 1 tablet by mouth once a day 7)  Bl Vitamin C 500 Mg Tabs (Ascorbic acid) .... Once daily 8)  Vitamin E Natural 400 Unit Caps (Vitamin e) .... Take 1 tablet by mouth once a day 9)  Omega-3 350 Mg Caps (Omega-3 fatty acids) .... 1000mg  two times a day 10)  Vicodin 5-500 Mg Tabs (Hydrocodone-acetaminophen) .Marland Kitchen.. 1 by mouth two times a day as needed for shoulder pain 11)  Prilosec 10 Mg Cpdr (Omeprazole) .Marland Kitchen.. 1-2 by mouth at bedtime 12)  Calcium Carbonate-vitamin D 600-400 Mg-unit Tabs (Calcium carbonate-vitamin d) .... 2 daily 13)  Ambien 5 Mg Tabs (Zolpidem tartrate) .Marland Kitchen.. 1 tablet occasionally at pm for sleep 14)  Zetia 10 Mg Tabs (Ezetimibe) .Marland Kitchen.. 1 by mouth daily  Patient Instructions: 1)  Please schedule a follow-up  appointment in 3-6 months .  2)  Call if any stomach pain or shortness of breath or lightheadedness  3)  Consider a mammogram    Orders Added: 1)  [place]- Est  Level 4 [id number]   Immunization History:  Zostavax History:    Zostavax:  zostavax (08/05/2009)   Immunization History:  Zostavax History:    Zostavax:  Zostavax (08/05/2009)   Prevention & Chronic Care Immunizations  Influenza vaccine: Fluvax 3+  (11/15/2008)   Influenza vaccine due: 11/07/2007    Tetanus booster: 02/06/1987: Done.   Tetanus booster due: 02/05/1997    Pneumococcal vaccine: Done.  (01/06/1995)   Pneumococcal vaccine due: None    H. zoster vaccine: 08/05/2009: Zostavax  Colorectal Screening   Hemoccult: Done.  (03/08/1996)   Hemoccult due: Not Indicated    Colonoscopy:  Results: Normal. [facility]  (01/26/2008)   Colonoscopy due: 02/2018  Other Screening   Pap smear: Had hysterectormy  (11/10/2007)   Pap smear due: Not Indicated    Mammogram: normal  (04/08/2006)   Mammogram action/deferral: Refused  (10/07/2008)   Mammogram due: 04/08/2007    DXA bone density scan: Lumbar Spine:  T Score-2.5 to -1.0 Spine.   Hip Total: T Score -2.5 to -1.0 Hip.   Women's    (06/12/2007)   DXA scan due: None    Smoking status: never  (11/23/2009)  Lipids   Total Cholesterol: 175  (04/21/2009)   LDL: 81  (04/21/2009)   LDL Direct: Not documented   HDL: 41  (04/21/2009)   Triglycerides: 265  (04/21/2009)    SGOT (AST): 19  (07/06/2009)   SGPT (ALT): 17  (07/06/2009)   Alkaline phosphatase: 58  (07/06/2009)   Total bilirubin: 1.2  (07/06/2009)    Lipid flowsheet reviewed?: Yes   Progress toward LDL goal: At goal  Hypertension   Last Blood Pressure: 135 / 82  (11/23/2009)   Serum creatinine: 1.44  (07/06/2009)   Serum potassium 4.9  (07/06/2009)    Hypertension flowsheet reviewed?: Yes   Progress toward BP goal: At goal  Self-Management Support :   Personal Goals (by the next  clinic visit) :      Personal blood pressure goal: 140/90  (04/21/2009)     Personal LDL goal: 70  (04/21/2009)    Hypertension self-management support: Not documented    Hypertension self-management support not done because: Good outcomes  (11/15/2008)    Lipid self-management support: Written self-care plan  (10/07/2008)

## 2010-03-07 NOTE — Consult Note (Signed)
Summary: [place] By: Raymond Gurney 02/03/2009 11:45:00  _____________________________________________________________________  External Attachment:    Type:   Image     Comment:   External Document

## 2010-03-07 NOTE — Assessment & Plan Note (Signed)
Summary: sinus infection?   Vital Signs:  Patient Profile:   77 Years Old Female Height:     64 inches (162.56 cm) Weight:      161 pounds BMI:     27.74 Temp:     97.9 degrees F Pulse rate:   60 / minute BP sitting:   143 / 81  (left arm) Cuff size:   regular  Pt. in pain?   no  Vitals Entered By: Elray Mcgregor RN (January 27, 2008 11:17 AM)              Is Patient Diabetic? No     PCP:  Talbert Cage MD  Chief Complaint:  Work in for sinus and ear infection.  History of Present Illness: 77 yo with h/o chronic recurrent otitis and b/l TM rupture.  Has had surgery on L TM in past by Dr. Constance Holster.  Had h/o MSSA ear infection treated with 30 days of keflex by Dr. Johnnye Sima.  Here today with 3 week h/o drainage out of left ear and congestion.  Thinks she has another ear infection.  No pain in ear.  No fevers.    Also wants K and LFTs checked today.    Current Allergies: ! * TRAMADOL  Past Medical History:    chronic bilat TM perforations    HGB A!c - 5.9 09/11/01      Physical Exam  General:     alert, well-developed, well-nourished, and well-hydrated.   Head:     normocephalic and atraumatic.  Sinuses nontender. Ears:     Left ear with discharge, TM not erythematous.  R TM scarred with no significant discharge. Neck:     No deformities, masses, or tenderness noted. Lungs:     Normal respiratory effort, chest expands symmetrically. Lungs are clear to auscultation, no crackles or wheezes. Heart:     Normal rate and regular rhythm. S1 and S2 normal without gallop, murmur, click, rub or other extra sounds. Psych:     Cognition and judgment appear intact. Alert and cooperative with normal attention span and concentration. No apparent delusions, illusions, hallucinations    Impression & Recommendations:  Problem # 1:  OTITIS MEDIA, CHRONIC (ICD-382.9) Assessment: Deteriorated Treat with keflex for 10 days given h/o MSSA in past. Her updated medication list  for this problem includes:    Adprin B 325 Mg Tabs (Aspirin buf(cacarb-mgcarb-mgo)) .Marland Kitchen... Take 1 tablet by mouth once a day    Keflex 500 Mg Caps (Cephalexin) .Marland Kitchen... 1 tablet by mouth two times a day for 10 days  Orders: [facility]- Est Level  3 (99213) [facility]- Est Level  3 [id number])   Problem # 2:  HYPERKALEMIA (ICD-276.7) Assessment: Unchanged Check K today per Dr. Margaretha Sheffield request. Orders: [facility]- Est Level  3 (99213) [facility] [id number]) [place]- Est Level  3 [id number])   Problem # 3:  HYPERCHOLESTEROLEMIA (ICD-272.0) Assessment: Unchanged Check LFTs since she is on statin per pt's request. Her updated medication list for this problem includes:    Crestor 20 Mg Tabs (Rosuvastatin calcium) .Marland Kitchen... Take 1 tablet by mouth once a day   Complete Medication List: 1)  Altace 10 Mg Caps (Ramipril) .... Take 1 tablet by mouth once a day 2)  Crestor 20 Mg Tabs (Rosuvastatin calcium) .... Take 1 tablet by mouth once a day 3)  Coreg 25 Mg Tabs (Carvedilol) .... One tab by mouth two times a day 4)  Aldactone 25 Mg Tabs (Spironolactone) .... Take 1 tablet by mouth once  a day 5)  Demadex 20 Mg Tabs (Torsemide) .... One tablet every other day for fluid 6)  Adprin B 325 Mg Tabs (Aspirin buf(cacarb-mgcarb-mgo)) .... Take 1 tablet by mouth once a day 7)  Bl Vitamin C 500 Mg Tabs (Ascorbic acid) .... Once daily 8)  Vitamin E Natural 400 Unit Caps (Vitamin e) .... Take 1 tablet by mouth once a day 9)  Omega-3 350 Mg Caps (Omega-3 fatty acids) .... 1000mg  once daily 10)  Vicodin 5-500 Mg Tabs (Hydrocodone-acetaminophen) .Marland Kitchen.. 1 by mouth two times a day as needed thumb pain 11)  Allegra-d 12 Hour 60-120 Mg Tb12 (Fexofenadine-pseudoephedrine) .Marland Kitchen.. 1 by mouth two times a day prn 12)  Prilosec 10 Mg Cpdr (Omeprazole) .Marland Kitchen.. 1-2 by mouth at bedtime 13)  Calcium Carbonate-vitamin D 600-400 Mg-unit Tabs (Calcium carbonate-vitamin d) .... 2 daily 14)  Ambien 10 Mg Tabs (Zolpidem tartrate) .... 1/2 -1 by mouth at bedtime  as needed take only rarely 15)  Triamcinolone Acetonide 0.1 % Crea (Triamcinolone acetonide) .... Apply two times a day to sore areas in scalp use for 2 weeks then taper down - 30 gram 16)  Potassium Chloride 20 Meq Pack (Potassium chloride) .Marland Kitchen.. 1 daily 17)  Keflex 500 Mg Caps (Cephalexin) .Marland Kitchen.. 1 tablet by mouth two times a day for 10 days    Prescriptions: KEFLEX 500 MG CAPS (CEPHALEXIN) 1 tablet by mouth two times a day for 10 days  #20 x 0   Entered and Authorized by:   Dixon Boos MD   Signed by:   Dixon Boos MD on 01/27/2008   Method used:   Electronically to        [id number]* (retail)       [place]       [place], [place]  [address]       Ph: [phone] or [phone]       Fax: [phone]   RxID:   [id number]  ]

## 2010-03-07 NOTE — Letter (Signed)
Summary: Generic Letter  [facility]  [address]   [place], [place] [address]   Phone: [phone]  Fax: [phone]    04/22/2009  Jodi [name] [address] [place], [place]  [address]  Dear [name],  All your blood tests look normal including your liver tests.  You cholesterol is better than before.  You should discuss whether you stay on Zetia with your cardiologist.  Have a great Spring.  Sincerely,   Talbert Cage MD  Appended Document: Generic Letter mailed.

## 2010-03-07 NOTE — Letter (Signed)
Summary: Generic Letter  [facility]  [address]   [place], [place] [address]   Phone: [phone]  Fax: [phone]    12/07/2008  Jodi [name] [address] [place], [place]  [address]  Dear Ms. Connell,  Your blood tests all are good.  Your anemia is getting better.   Your potassium is good so do NOT take any potassium tablets.   Sincerely,   Talbert Cage MD  Appended Document: Generic Letter mailed    Appended Document: Generic Letter letter returned unable to forward.

## 2010-03-07 NOTE — Letter (Signed)
Summary: Generic Letter  [facility]  [address]   [place], [place] [address]   Phone: [phone]  Fax: [phone]    07/07/2009  MYHA MECHANIC [address] [place], [place]  [address]  Dear Ms. Sipp,  I'm happy to say that all your blood tests are all good.  Particularly your liver tests all look fine.  Have a great summer.   Sincerely,   Talbert Cage MD   Appended Document: Generic Letter mailed.

## 2010-03-07 NOTE — Consult Note (Signed)
Summary: [facility]   Imported By: Bonner Puna 05/20/2008 14:51:18  _____________________________________________________________________  External Attachment:    Type:   Image     Comment:   External Document

## 2010-03-07 NOTE — Assessment & Plan Note (Signed)
Summary: UTI SxS   Vital Signs:  Patient profile:   77 year old female Height:      64 inches Weight:      162.1 pounds BMI:     27.92 Temp:     97.8 degrees F oral Pulse rate:   59 / minute BP sitting:   170 / 88  (left arm) Cuff size:   regular  Vitals Entered By: Levert Feinstein LPN (September  2, 624THL 11:07 AM)  Serial Vital Signs/Assessments:  Comments: 11:10 AM Manual BP: 146/55 By: Levert Feinstein LPN   CC: UTI s/s Pain Assessment Patient in pain? no        Primary Care Provider:  Talbert Cage MD  CC:  UTI s/s.  History of Present Illness: DYSURIA Onset:  yesterday Description: burning and frequency Modifying factors: could not sleep at all last PM  Symptoms Urgency: Y  Frequency: Y  Hesitancy: N  Hematuria: N  Flank Pain: N  Fever: N Nausea/Vomiting: N  Discharge: N Irritants: N  Red Flags   More than 3 UTI's last 12 months: N  PMH of  Diabetes or Immunosuppression: N  Renal Disease/Calculi: N Urinary Tract Abnormality: N  Instrumentation or Trauma: N  HYPERTENSION Disease Monitoring   Blood pressure range:  140/60s at home     Chest pain: N     Dyspnea:N Medications   Compliance: daily although has been taking Torsemide daily instead of qod    Lightheadedness:  N     Edema:N   Habits & Providers  Alcohol-Tobacco-Diet     Tobacco Status: never  Current Medications (verified): 1)  Altace 10 Mg  Caps (Ramipril) .... Take 1 Tablet By Mouth Once A Day 2)  Crestor 20 Mg  Tabs (Rosuvastatin Calcium) .... Take 1 Tablet By Mouth Once A Day 3)  Coreg 25 Mg  Tabs (Carvedilol) .... One Tab By Mouth Two Times A Day 4)  Aldactone 25 Mg  Tabs (Spironolactone) .... Take 1 Tablet By Mouth Once A Day 5)  Demadex 20 Mg  Tabs (Torsemide) .... One Tablet Every Other Day For Fluid 6)  Adprin B 325 Mg  Tabs (Aspirin Buf(Cacarb-Mgcarb-Mgo)) .... Take 1 Tablet By Mouth Once A Day 7)  Bl Vitamin C 500 Mg  Tabs (Ascorbic Acid) .... Once Daily 8)  Vitamin E  Natural 400 Unit  Caps (Vitamin E) .... Take 1 Tablet By Mouth Once A Day 9)  Omega-3 350 Mg  Caps (Omega-3 Fatty Acids) .... 1000mg  Once Daily 10)  Vicodin 5-500 Mg Tabs (Hydrocodone-Acetaminophen) .Marland Kitchen.. 1 By Mouth Two Times A Day As Needed Thumb Pain 11)  Allegra-D 12 Hour 60-120 Mg Tb12 (Fexofenadine-Pseudoephedrine) .Marland Kitchen.. 1 By Mouth Two Times A Day Prn 12)  Prilosec 10 Mg  Cpdr (Omeprazole) .Marland Kitchen.. 1-2 By Mouth At Bedtime 13)  Calcium Carbonate-Vitamin D 600-400 Mg-Unit  Tabs (Calcium Carbonate-Vitamin D) .... 2 Daily 14)  Triamcinolone Acetonide 0.1 % Crea (Triamcinolone Acetonide) .... Apply Two Times A Day To Sore Areas in Scalp Use For 2 Weeks Then Taper Down - 30 Gram 15)  Potassium Chloride 20 Meq  Pack (Potassium Chloride) .Marland Kitchen.. 1 Every Other Day 16)  Cipro 250 Mg Tabs (Ciprofloxacin Hcl) .Marland Kitchen.. 1 By Mouth 2 Times Daily  Allergies: 1)  ! * Tramadol 2)  ! Septra  Physical Exam  General:  Well-developed,well-nourished,in no acute distress; alert,appropriate and cooperative throughout examination Lungs:  Normal respiratory effort, chest expands symmetrically. Lungs are clear to auscultation, no crackles or wheezes.  Heart:  Grade 3/6 sys m bradycardic Msk:  no costrovertebral angle tenderness  Extremities:  no edema   Impression & Recommendations:  Problem # 1:  UTI (ICD-599.0)  No systemic symptoms but has several comorbidities.  Will treat with cipro and watch closely The following medications were removed from the medication list:    Keflex 500 Mg Caps (Cephalexin) .Marland Kitchen... Take 1 tablet by mouth 4 times a day Her updated medication list for this problem includes:    Cipro 250 Mg Tabs (Ciprofloxacin hcl) .Marland Kitchen... 1 by mouth 2 times daily  Orders: [place]- Est  Level 4 [id number])  Problem # 2:  HYPERTENSION, BENIGN SYSTEMIC (ICD-401.1)  Elevated to day but maybe due to dysuria.  Follow up in one month Her updated medication list for this problem includes:    Altace 10 Mg Caps (Ramipril)  .Marland Kitchen... Take 1 tablet by mouth once a day    Coreg 25 Mg Tabs (Carvedilol) ..... One tab by mouth two times a day    Aldactone 25 Mg Tabs (Spironolactone) .Marland Kitchen... Take 1 tablet by mouth once a day    Demadex 20 Mg Tabs (Torsemide) ..... One tablet every other day for fluid  BP today: 170/88 Prior BP: 129/70 (04/12/2008)  Labs Reviewed: K+: 5.2 (01/27/2008) Creat: : 1.36 (01/27/2008)   Chol: 195 (05/14/2008)   HDL: 43 (05/14/2008)   LDL: 100 (05/14/2008)   TG: 260 (05/14/2008)  Orders: [place]- Est  Level 4 ([id number])  Problem # 3:  HYPERCHOLESTEROLEMIA (ICD-272.0) would like to have below 70 with her cardiac history but is on high dose statin already.  Work on diet Her updated medication list for this problem includes:    Crestor 20 Mg Tabs (Rosuvastatin calcium) .Marland Kitchen... Take 1 tablet by mouth once a day  Labs Reviewed: SGOT: 16 (01/27/2008)   SGPT: 13 (01/27/2008)   HDL:43 (05/14/2008), 41 (11/07/2006)  LDL:100 (05/14/2008), 94 (11/07/2006)  Chol:195 (05/14/2008), 189 (11/07/2006)  Trig:260 (05/14/2008), 270 (11/07/2006)  Complete Medication List: 1)  Altace 10 Mg Caps (Ramipril) .... Take 1 tablet by mouth once a day 2)  Crestor 20 Mg Tabs (Rosuvastatin calcium) .... Take 1 tablet by mouth once a day 3)  Coreg 25 Mg Tabs (Carvedilol) .... One tab by mouth two times a day 4)  Aldactone 25 Mg Tabs (Spironolactone) .... Take 1 tablet by mouth once a day 5)  Demadex 20 Mg Tabs (Torsemide) .... One tablet every other day for fluid 6)  Adprin B 325 Mg Tabs (Aspirin buf(cacarb-mgcarb-mgo)) .... Take 1 tablet by mouth once a day 7)  Bl Vitamin C 500 Mg Tabs (Ascorbic acid) .... Once daily 8)  Vitamin E Natural 400 Unit Caps (Vitamin e) .... Take 1 tablet by mouth once a day 9)  Omega-3 350 Mg Caps (Omega-3 fatty acids) .... 1000mg  once daily 10)  Vicodin 5-500 Mg Tabs (Hydrocodone-acetaminophen) .Marland Kitchen.. 1 by mouth two times a day as needed thumb pain 11)  Allegra-d 12 Hour 60-120 Mg Tb12  (Fexofenadine-pseudoephedrine) .Marland Kitchen.. 1 by mouth two times a day prn 12)  Prilosec 10 Mg Cpdr (Omeprazole) .Marland Kitchen.. 1-2 by mouth at bedtime 13)  Calcium Carbonate-vitamin D 600-400 Mg-unit Tabs (Calcium carbonate-vitamin d) .... 2 daily 14)  Triamcinolone Acetonide 0.1 % Crea (Triamcinolone acetonide) .... Apply two times a day to sore areas in scalp use for 2 weeks then taper down - 30 gram 15)  Potassium Chloride 20 Meq Pack (Potassium chloride) .Marland Kitchen.. 1 every other day 16)  Cipro 250 Mg Tabs (Ciprofloxacin  hcl) .... 1 by mouth 2 times daily  Other Orders: [facility]inal[facility] (00000)  Patient Instructions: 1)  Please schedule a follow-up appointment in 1 month.  2)  Finish all antibiotics call if any fever or vomting or if not better in 2 day 3)  Bring in your blood pressure machine and your readngs next visit 4)  Bring in ALL your MEDICATIONS next visit Prescriptions: CIPRO 250 MG TABS (CIPROFLOXACIN HCL) 1 by mouth 2 times daily  #14 x 0   Entered and Authorized by:   Talbert Cage MD   Signed by:   Talbert Cage MD on 10/07/2008   Method used:   Electronically to        [facility]. [id number]* (retail)       [place]       [place], [place]  [address]       Ph: [phone] or [phone]       Fax: [phone]   RxID:   [id number]    Prevention & Chronic Care Immunizations   Influenza vaccine: Fluvax MCR  (11/10/2007)   Influenza vaccine due: 11/07/2007    Tetanus booster: 02/06/1987: Done.   Tetanus booster due: 02/05/1997    Pneumococcal vaccine: Done.  (01/06/1995)   Pneumococcal vaccine due: None    H. zoster vaccine: Not documented  Colorectal Screening   Hemoccult: Done.  (03/08/1996)   Hemoccult due: Not Indicated    Colonoscopy:  Results: Normal. [facility]  (01/26/2008)   Colonoscopy due: 02/2018  Other Screening   Pap smear: Had hysterectormy  (11/10/2007)   Pap smear due: Not Indicated    Mammogram: normal   (04/08/2006)   Mammogram action/deferral: Refused  (10/07/2008)   Mammogram due: 04/08/2007    DXA bone density scan: Lumbar Spine:  T Score-2.5 to -1.0 Spine.   Hip Total: T Score -2.5 to -1.0 Hip.   Women's    (06/12/2007)   DXA scan due: None    Smoking status: never  (10/07/2008)  Lipids   Total Cholesterol: 195  (05/14/2008)   LDL: 100  (05/14/2008)   LDL Direct: Not documented   HDL: 43  (05/14/2008)   Triglycerides: 260  (05/14/2008)    SGOT (AST): 16  (01/27/2008)   SGPT (ALT): 13  (01/27/2008)   Alkaline phosphatase: 73  (01/27/2008)   Total bilirubin: 0.8  (01/27/2008)    Lipid flowsheet reviewed?: Yes   Progress toward LDL goal: At goal  Hypertension   Last Blood Pressure: 170 / 88  (10/07/2008)   Serum creatinine: 1.36  (01/27/2008)   Serum potassium 5.2  (01/27/2008)    Hypertension flowsheet reviewed?: Yes   Progress toward BP goal: Unchanged  Self-Management Support :    Patient will work on the following items until the next clinic visit to reach self-care goals:     Eating: eat more vegetables, eat baked foods instead of fried foods  (10/07/2008)    Hypertension self-management support: Not documented    Lipid self-management support: Written self-care plan  (10/07/2008)   Lipid self-care plan printed.  Laboratory Results   Urine Tests  Date/Time Received: October 07, 2008 11:00 AM  Date/Time Reported: October 07, 2008 11:27 AM   Routine Urinalysis   Color: yellow Appearance: Clear Glucose: negative   (Normal Range: Negative) Bilirubin: negative   (Normal Range: Negative) Ketone: negative   (Normal Range: Negative) Spec. Gravity: 1.020   (Normal Range: 1.003-1.035) Blood: moderate   (  Normal Range: Negative) pH: 6.0   (Normal Range: 5.0-8.0) Protein: negative   (Normal Range: Negative) Urobilinogen: 0.2   (Normal Range: 0-1) Nitrite: negative   (Normal Range: Negative) Leukocyte Esterace: trace   (Normal Range: Negative)  Urine  Microscopic WBC/HPF: >20 with few clump RBC/HPF: 10 - 20 Bacteria/HPF: 1+ Epithelial/HPF: occ Casts/LPF: >20 hyaline cast    Comments: ...............test performed by......Marland KitchenBonnie A. Martinique, [place] (ASCP)

## 2010-03-07 NOTE — Letter (Signed)
Summary: Generic Letter  [facility]  [address]   [place], [place] [address]   Phone: [phone]  Fax: [phone]    06/27/2007 MRN: [id number]  [address] [place], [place]  [address]  Dear Ms. Fanton,  Your potassium was back to normal.  Please continue to stop your Aldactone because it is at the upper limits of normal and too high to be on Aldactone.  Tests: (1) Basic Metabolic Panel (A999333)   Sodium                    136 mEq/L                   135-145   Potassium                 5.0 mEq/L                   3.5-5.3   Chloride                  103 mEq/L                   96-112   CO2                       22 mEq/L                    19-32   Glucose              [H]  115 mg/dL                   70-99   BUN                  [H]  25 mg/dL                    6-23   Creatinine                1.09 mg/dL                  0.40-1.20   Calcium                   9.4 mg/dL                   8.4-10.5  Sincerely,     Hortencia Pilar MD [place]

## 2010-03-07 NOTE — Miscellaneous (Signed)
  Clinical Lists Changes  Observations: Added new observation of COLONNXTDUE: 02/2018 (01/28/2008 10:32) Added new observation of COLONOSCOPY:  Results: Normal. [facility] (01/26/2008 10:34)      Colonoscopy  Procedure date:  01/26/2008  Findings:       Results: Normal. [facility]  Procedures Next Due Date:    Colonoscopy: 02/2018   Colonoscopy  Procedure date:  01/26/2008  Findings:       Results: Normal. [facility]  Procedures Next Due Date:    Colonoscopy: 02/2018

## 2010-03-07 NOTE — Consult Note (Signed)
Summary: [facility]   Imported By: Beryle Lathe 10/10/2007 12:52:19  _____________________________________________________________________  External Attachment:    Type:   Image     Comment:   External Document

## 2010-03-07 NOTE — Miscellaneous (Signed)
Summary: Re: shingles vaccine.   Clinical Lists Changes  Patient was down for an appointment this AM to receive shingles vaccine. RN called patient and she does have medicare Part D. Advised her that Dr. Erin Hearing will write an RX and she will need to take to her pharmacy. She voices understanding. Will call patient when Rx is ready to pick up. Marcell Barlow RN  July 11, 2009 8:51 AM  Pt informed that Rx was ready for pickup................................................ Glorianne Manchester July 11, 2009 9:19 AM   Prescriptions: ZOSTAVAX [id number] UNT/0.65ML SOLR (ZOSTER VACCINE LIVE) inject subq in deltoid  #1 x 0   Entered and Authorized by:   Talbert Cage MD   Signed by:   Talbert Cage MD on 07/11/2009   Method used:   Print then Give to Patient   RxID:   [id number]

## 2010-03-07 NOTE — Progress Notes (Signed)
Summary: triage  Phone Note Call from Patient Call back at Home Phone [phone]   Caller: Patient Summary of Call: has sinus inf & ear inf and would like anitbiotic called into [facility] [address] Initial call taken by: Audie Clear,  January 26, 2008 1:43 PM  Follow-up for Phone Call        c/o 2 wk of yellow, thick nasal discharge. told her must see md. appt made for next day. encouraged fluids Follow-up by: Elige Radon RN,  January 26, 2008 2:42 PM

## 2010-03-07 NOTE — Miscellaneous (Signed)
  Clinical Lists Changes  Problems: Added new problem of ANEMIA (ICD-285.9) - Signed Orders: Added new Test order of CBC-[facility] [id number]) - Signed Added new Test order of Ferritin-[facility] [phone]) - Signed Added new Test order of B12-[facility] [id number]) - Signed Added new Test order of Basic Met-[facility] [id number]) - Signed   Called left message for her to contact us Talbert Cage MD  November 18, 2008 3:53 PM

## 2010-03-07 NOTE — Progress Notes (Signed)
Summary: BP reading  Phone Note Call from Patient Call back at Home Phone [phone]   Caller: Patient Summary of Call: March 21 - 152/79 April 18th - 127/66 May 20 - 158/70  Initial call taken by: Audie Clear,  Jun 27, 2009 3:21 PM

## 2010-03-07 NOTE — Progress Notes (Signed)
Summary: Rx Req  Phone Note Refill Request Call back at Home Phone [phone] Message from:  Patient  Refills Requested: Medication #1:  ALTACE 10 MG  CAPS Take 1 tablet by mouth once a day [place].  Initial call taken by: Raymond Gurney,  April 07, 2009 11:45 AM  Follow-up for Phone Call        will forward to MD. Follow-up by: Marcell Barlow RN,  April 07, 2009 12:21 PM    Prescriptions: ALTACE 10 MG  CAPS (RAMIPRIL) Take 1 tablet by mouth once a day  #30 Capsule x 5   Entered and Authorized by:   Talbert Cage MD   Signed by:   Talbert Cage MD on 04/07/2009   Method used:   Electronically to        [facility]. [id number]* (retail)       [place], [place]  [address]       Ph: [phone]       Fax: [phone]   RxID:   [id number]

## 2010-03-07 NOTE — Consult Note (Signed)
Summary: [facility]   Imported By: Raymond Gurney 08/10/2009 16:06:42  _____________________________________________________________________  External Attachment:    Type:   Image     Comment:   External Document

## 2010-03-07 NOTE — Assessment & Plan Note (Signed)
Summary: 77MONTH FU/VS   PCP:  Talbert Cage MD  Chief Complaint:  one month f/u visit.  History of Present Illness: 77 yo with h/o chronic recurrent otitis and b/l TM rupture.  Has had surgery on L TM in past by Dr. Constance Holster.  Had h/o MSSA ear infection treated with 30 days of keflex 9-08. Currently states that her ears have been draining for the last month. Has had some bloody d/c as well, mostly yellow.  No fevers or chills. States that her hearing is decreased as well. No headache. was given a prescription for keflex (10 days)  last month with no change.  At previous ID visit was started on bactrim but within 3 days developed adverse drug reaction. she was then changed to keflex qid as her isolate was MSSA.  she states that since her previous visit she has had some decrease in her discharge from her ear but it persists. will finnish keflex tommorow.    Current Allergies: ! * TRAMADOL ! SEPTRA  Risk Factors: Tobacco use:  never Passive smoke exposure:  no Drug use:  no Caffeine use:  2 drinks per day Alcohol use:  no Exercise:  no Seatbelt use:  100 %  Colonoscopy History:    Date of Last Colonoscopy:  01/26/2008  Mammogram History:    Date of Last Mammogram:  04/08/2006  PAP Smear History:    Date of Last PAP Smear:  11/10/2007   Vital Signs:  Patient Profile:   77 Years Old Female Height:     64 inches (162.56 cm) Weight:      162 pounds (73.64 kg) BMI:     27.91 Temp:     96.9 degrees F (36.06 degrees C) oral Pulse rate:   56 / minute BP sitting:   129 / 70  (left arm)  Pt. in pain?   no  Vitals Entered By: Sherrie George [facility]) (April 12, 2008 10:43 AM)              Is Patient Diabetic? No Nutritional Status BMI of 25 - 29 = overweight Nutritional Status Detail too good  Have you ever been in a relationship where you felt threatened, hurt or afraid?No   Does patient need assistance? Functional Status Self care Ambulation Normal    Physical  Exam  General:     well-developed, well-nourished, and well-hydrated.   Ears:     L ear with perforated TM, significantly decreased discharge. R ear with perforated TM, significantly decreased discharge.     Impression & Recommendations:  Problem # 1:  OTITIS MEDIA, CHRONIC (ICD-382.9) her ear is improved. the d/c is not comletely resolved but I doubt that it will ever totally resolve. she may need occasional courses of anbx to keep this at [place]. Will try to have her re-seen by ENT. will f/u in ID clinic as needed.  Her updated medication list for this problem includes:    Adprin B 325 Mg Tabs (Aspirin buf(cacarb-mgcarb-mgo)) .Marland Kitchen... Take 1 tablet by mouth once a day    Keflex 500 Mg Caps (Cephalexin) .Marland Kitchen... Take 1 tablet by mouth 4 times a day  Orders: Est. Patient Level III [id number])

## 2010-03-07 NOTE — Miscellaneous (Signed)
Summary: [facility]   Imported By: Beryle Lathe 09/19/2007 08:33:15  _____________________________________________________________________  External Attachment:    Type:   Image     Comment:   External Document

## 2010-03-07 NOTE — Assessment & Plan Note (Signed)
Summary: FU/KH   Vital Signs:  Patient Profile:   77 Years Old Female Height:     64 inches (162.56 cm) Weight:      163.8 pounds BMI:     28.22 Pulse rate:   53 / minute BP sitting:   156 / 73  (left arm) Cuff size:   regular  Vitals Entered By: Levert Feinstein LPN (May 21, 579FGE [id number] AM)                 PCP:  Talbert Cage MD  Chief Complaint:  f/u visit.  History of Present Illness: 77 yo F with:   1. Hypokalemia - occured p episodes of emesis and still taking acei, spironlactone.  no recent vomiting.  eating and drinking normally.  has been holding acei and aldactone.  last BMET with normal K Hx  HTN, Hypothyroid, systolic CHF EF< [id number].  2. HTN: No shortness of breath, chest pain, edema.  Tolerating medications well.  Knows medication regimen.  No side effects.  Had been hypotensive but bp up now.      Current Allergies: ! * TRAMADOL      Physical Exam  General:     Well-developed,well-nourished,in no acute distress; alert,appropriate and cooperative throughout examination Ears:     Sar formation noted with dried blood on right TM.  No signs of mastoiditis Nose:     External nasal examination shows no deformity or inflammation. Nasal mucosa are pink and moist without lesions or exudates. Mouth:     Oral mucosa and oropharynx without lesions or exudates.  Teeth in good repair. Lungs:     Normal respiratory effort, chest expands symmetrically. Lungs are clear to auscultation, no crackles or wheezes. Heart:     Normal rate and regular rhythm. S1 and S2 normal without gallop, murmur, click, rub or other extra sounds. Extremities:     no clubbing, cyanosis, or edema.    Impression & Recommendations:  Problem # 1:  HYPERKALEMIA (ICD-276.7) Assessment: Improved will recheck K+ to confirm still normal and restart altace.  at next visit with dr. Erin Hearing will consider restarting spironolactone if bp still needs it and K not too high. Orders: [place]- Est Level   3 [id number])   Problem # 2:  HYPERTENSION, BENIGN SYSTEMIC (ICD-401.1) Assessment: Deteriorated elevated. will restart acei.  Blood pressure - start back your Altace but still stop your Spionolactone.  Make an appointment to see Dr. Erin Hearing in 2-3 weeks. Her updated medication list for this problem includes:    Altace 10 Mg Caps (Ramipril) .Marland Kitchen... Take 1 tablet by mouth once a day    Coreg 25 Mg Tabs (Carvedilol) ..... One tab by mouth two times a day    Aldactone 25 Mg Tabs (Spironolactone) .Marland Kitchen... Take 1 tablet by mouth once a day    Demadex 20 Mg Tabs (Torsemide) ..... One tablet every other day for fluid  Orders: Basic Met-[facility] [id number]) [place]- Est Level  3 [id number])   Complete Medication List: 1)  Altace 10 Mg Caps (Ramipril) .... Take 1 tablet by mouth once a day 2)  Crestor 20 Mg Tabs (Rosuvastatin calcium) .... Take 1 tablet by mouth once a day 3)  Coreg 25 Mg Tabs (Carvedilol) .... One tab by mouth two times a day 4)  Aldactone 25 Mg Tabs (Spironolactone) .... Take 1 tablet by mouth once a day 5)  Demadex 20 Mg Tabs (Torsemide) .... One tablet every other day for fluid 6)  Klor-con M20 20 Meq Tbcr (  Potassium chloride crys cr) .... One once a week 7)  Adprin B 325 Mg Tabs (Aspirin buf(cacarb-mgcarb-mgo)) .... Take 1 tablet by mouth once a day 8)  Bl Vitamin C 500 Mg Tabs (Ascorbic acid) .... Once daily 9)  Vitamin E Natural 400 Unit Caps (Vitamin e) .... Take 1 tablet by mouth once a day 10)  Omega-3 350 Mg Caps (Omega-3 fatty acids) .... 1000mg  once daily 11)  Vicodin 5-500 Mg Tabs (Hydrocodone-acetaminophen) .Marland Kitchen.. 1 by mouth two times a day as needed thumb pain 12)  Allegra-d 12 Hour 60-120 Mg Tb12 (Fexofenadine-pseudoephedrine) .Marland Kitchen.. 1 by mouth two times a day prn 13)  Prilosec 10 Mg Cpdr (Omeprazole) .Marland Kitchen.. 1-2 by mouth at bedtime 14)  Calcium Carbonate-vitamin D 600-400 Mg-unit Tabs (Calcium carbonate-vitamin d) .... 2 daily 15)  Ambien 10 Mg Tabs (Zolpidem tartrate) .... 1/2 -1  by mouth at bedtime as needed take only rarely 16)  Promethazine Hcl 12.5 Mg Tabs (Promethazine hcl) .Marland Kitchen.. 1 tab by mouth every 8 hrs as needed for vomit   Patient Instructions: 1)  Blood pressure - start back your Altace but still stop your Spionolactone.  Make an appointment to see Dr. Erin Hearing in 2-3 weeks.   ]

## 2010-03-07 NOTE — Assessment & Plan Note (Signed)
Summary: hand surgery wp   Vital Signs:  Patient Profile:   77 Years Old Female Height:     64 inches (162.56 cm) Weight:      160 pounds Temp:     98.1 degrees F Pulse rate:   63 / minute BP sitting:   126 / 67  Pt. in pain?   yes    Location:   left wrist    Intensity:   5  Vitals Entered By: Christen Bame CMA (August 14, 2007 10:34 AM)                   PCP:  Talbert Cage MD  Chief Complaint:  discuss hand surgery.  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  Splint is not helping any more.   No rash or redness or fever.  Had injections in the past.  Wants to investigate surgery  HyperKalemia Feels well no nausea or vomiting.  Not taking any K supplement but is back on her Spironolactone.    CHF No unusual shortness of breath or orthopnea or PND.  Saw Dr Melvern Banker recently.  Has echo scheduled.    ROS - as above PMH - Medications reviewed and updated in medication list.  Smoking Status noted in VS form      Current Allergies: ! * TRAMADOL      Physical Exam  Ears:     Left ear slightly red scarred TM no discharge.  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  She will investigate who takes her insurance Orders: [place]- Est Level  3 [id number])   Problem # 2:  HYPERKALEMIA (ICD-276.7) Check BMET today Orders: [place]- Est Level  3 [id number])   Problem # 3:  OTITIS MEDIA, CHRONIC (ICD-382.9) Assessment: Unchanged  Her updated medication list for this problem includes:    Adprin B 325 Mg Tabs (Aspirin buf(cacarb-mgcarb-mgo)) .Marland Kitchen... Take 1 tablet by mouth once a day   Complete Medication List: 1)  Altace 10 Mg Caps (Ramipril) .... Take 1 tablet by mouth once a day 2)  Crestor 20 Mg Tabs (Rosuvastatin calcium) .... Take 1 tablet by mouth once a day 3)  Coreg 25 Mg Tabs (Carvedilol) .... One  tab by mouth two times a day 4)  Aldactone 25 Mg Tabs (Spironolactone) .... Take 1 tablet by mouth once a day 5)  Demadex 20 Mg Tabs (Torsemide) .... One tablet every other day for fluid 6)  Adprin B 325 Mg Tabs (Aspirin buf(cacarb-mgcarb-mgo)) .... Take 1 tablet by mouth once a day 7)  Bl Vitamin C 500 Mg Tabs (Ascorbic acid) .... Once daily 8)  Vitamin E Natural 400 Unit Caps (Vitamin e) .... Take 1 tablet by mouth once a day 9)  Omega-3 350 Mg Caps (Omega-3 fatty acids) .... 1000mg  once daily 10)  Vicodin 5-500 Mg Tabs (Hydrocodone-acetaminophen) .Marland Kitchen.. 1 by mouth two times a day as needed thumb pain 11)  Allegra-d 12 Hour 60-120 Mg Tb12 (Fexofenadine-pseudoephedrine) .Marland Kitchen.. 1 by mouth two times a day prn 12)  Prilosec 10 Mg Cpdr (Omeprazole) .Marland Kitchen.. 1-2 by mouth at bedtime 13)  Calcium Carbonate-vitamin D 600-400 Mg-unit Tabs (Calcium carbonate-vitamin d) .... 2 daily 14)  Ambien 10 Mg Tabs (Zolpidem tartrate) .... 1/2 -1 by mouth at bedtime as needed take only rarely 15)  Cipro Hc 0.2-1 % Susp (Ciprofloxacin-hydrocortisone) .Marland KitchenMarland KitchenMarland Kitchen  3 drops in ear two times a day 1 bottle   Patient Instructions: 1)  [facility] - Dr Annie Main or Dr Caralyn Guile 2)  Dr Daylene Katayama is on [facility]  3)  I will call you if your lab is abnormal otherwise I will send you a letter within 2 weeks.    ]

## 2010-03-07 NOTE — Consult Note (Signed)
Summary: [facility] [place] By: Samara Snide 08/12/2007 10:31:50  _____________________________________________________________________  External Attachment:    Type:   Image     Comment:   External Document

## 2010-03-07 NOTE — Assessment & Plan Note (Signed)
Summary: SENSITIVITY CHARGE

## 2010-03-07 NOTE — Consult Note (Signed)
Summary: [facility]   Imported By: Raymond Gurney 10/06/2008 10:34:38  _____________________________________________________________________  External Attachment:    Type:   Image     Comment:   External Document

## 2010-03-07 NOTE — Progress Notes (Signed)
Summary: Ear Infection Recurrent  Please notify her I sent her rx in today thnks [facility] MD  July 31, 2007 10:44 AM     Phone Note Call from Patient Call back at [phone]   Reason for Call: Talk to Nurse Summary of Call: pt is requesting to speak wtih RN about possibly having another ear infection Initial call taken by: Samara Snide,  July 29, 2007 10:25 AM  Follow-up for Phone Call        [facility] [address] does not want to come in. states Dr. knows all about her ears & she recently got over an ear infection. wants antibiotic called to pharmacy. message to md  Follow-up by: Elige Radon RN,  July 29, 2007 11:07 AM  Additional Follow-up for Phone Call Additional follow up Details #1::        Called left message to call back Additional Follow-up by: Talbert Cage MD,  July 29, 2007 4:56 PM    Additional Follow-up for Phone Call Additional follow up Details #2::    Tried to call again no answer.   Follow-up by: Talbert Cage MD,  July 30, 2007 2:33 PM  Additional Follow-up for Phone Call Additional follow up Details #3:: Details for Additional Follow-up Action Taken: pt would like ear drops instead of antibiotics. Additional Follow-up by: Drucie Ip,  July 30, 2007 2:44 PM  New/Updated Medications: CIPRO HC 0.2-1 %  SUSP (CIPROFLOXACIN-HYDROCORTISONE) 3 drops in ear two times a day 1 bottle   Left message on patient voicemail the Rx has been called in.ASHA BENTON LPN  June 25, 579FGE [id number] AM   Prescriptions: CIPRO HC 0.2-1 %  SUSP (CIPROFLOXACIN-HYDROCORTISONE) 3 drops in ear two times a day 1 bottle  #1 x 1   Entered and Authorized by:   Talbert Cage MD   Signed by:   Talbert Cage MD on 07/31/2007   Method used:   [facility] ...       [facility]  [address] [id number]       [address]       [place], [place]  [address]       Ph: [phone] or [phone]       Fax: [phone]   RxID:    [id number]     Ejection Fraction Result Date:  07/28/2007 Ejection Fraction Result:  45

## 2010-03-07 NOTE — Assessment & Plan Note (Signed)
Summary: drainage wp   Vital Signs:  Patient Profile:   77 Years Old Female Height:     64 inches (162.56 cm) Weight:      164.6 pounds Temp:     98 degrees F BP sitting:   152 / 81  (left arm)  Pt. in pain?   yes    Location:    rt hand    Intensity:   5  Vitals Entered By: Marcell Barlow RN (May 05, 2007 1:34 PM)              Is Patient Diabetic? No     Chief Complaint:  Rt ear infection.  History of Present Illness: 77 y/o obese WF with extensive history of otits media s/p tubes and attempted TM patch per Dr Constance Holster presents with 3 day history of right ear drainage and mild pain that was worse yesterday during the day with yellow pus.  Has seen Dr Johnnye Sima with ID previously.  Has a history of MSSA infection that responded to a combo of keflex x 2 mos and cortisporin ear drops.  No preceding URTI.  Hearing is decreased chronically and worse today with the infection.  No fever.  Pain does not radiate and is mild.  She has tried to blot the area with a q-tip, but this only helps temporarily.      Current Allergies: ! * TRAMADOL      Physical Exam  General:     Obese WF in NAD. ALert and oriented.  cooperative, but obviously frustrated. Ears:     Left TM clear with a small perforation in ear drum likely from previous tube site. Right TM with a larger perforation in a similar area, with red and opaque TM with mild discharge noted.  THe are was cultured with a swab with minimal discharge on the tip. Mouth:     pharynx pink and moist and no exudates.   Neck:     No deformities, masses, or tenderness noted. Lungs:     Normal respiratory effort, chest expands symmetrically. Lungs are clear to auscultation, no crackles or wheezes. Heart:     normal rate, regular rhythm, and Grade  3 /6 systolic ejection murmur.   Skin:     Intact without suspicious lesions or rashes    Impression & Recommendations:  Problem # 1:  OTITIS MEDIA, CHRONIC (ICD-382.9) Assessment:  Deteriorated Restarted the keflex and the cortisporin ear drops since these worked previously.  Minimal ear drainage sent for culture.  Orders: Culture, Wound -[place] [id number]) [place]- Est  Level 4 [id number])   Complete Medication List: 1)  Altace 10 Mg Caps (Ramipril) .... Take 1 tablet by mouth once a day 2)  Crestor 20 Mg Tabs (Rosuvastatin calcium) .... Take 1 tablet by mouth once a day 3)  Coreg 25 Mg Tabs (Carvedilol) .... One tab by mouth two times a day 4)  Aldactone 25 Mg Tabs (Spironolactone) .... Take 1 tablet by mouth once a day 5)  Demadex 20 Mg Tabs (Torsemide) .... Mon-wed-fri 6)  Klor-con M20 20 Meq Tbcr (Potassium chloride crys cr) .... Qwed 7)  Adprin B 325 Mg Tabs (Aspirin buf(cacarb-mgcarb-mgo)) .... Take 1 tablet by mouth once a day 8)  Bl Vitamin C 500 Mg Tabs (Ascorbic acid) .... Once daily 9)  Vitamin E Natural 400 Unit Caps (Vitamin e) .... Take 1 tablet by mouth once a day 10)  Omega-3 350 Mg Caps (Omega-3 fatty acids) .... 1000mg  once daily 11)  Vicodin  5-500 Mg Tabs (Hydrocodone-acetaminophen) .Marland Kitchen.. 1 by mouth two times a day as needed thumb pain 12)  Allegra-d 12 Hour 60-120 Mg Tb12 (Fexofenadine-pseudoephedrine) .Marland Kitchen.. 1 by mouth two times a day prn 13)  Cortisporin 3.5-10000-1 Susp (Neomycin-polymyxin-hc) .... 2-3 drops 4 x a day after wicking out your ear.  1 bottle 14)  Prilosec 10 Mg Cpdr (Omeprazole) .Marland Kitchen.. 1-2 by mouth at bedtime 15)  Cephalexin 500 Mg Tabs (Cephalexin) .... One tab by mouth bid     Prescriptions: CEPHALEXIN 500 MG  TABS (CEPHALEXIN) One tab by mouth BID  #28 x 0   Entered and Authorized by:   Druscilla Brownie MD   Signed by:   Druscilla Brownie MD on 05/05/2007   Method used:   [facility] ...       [facility]  [address] [id number]       [address]       [place], [place]  [address]       Ph: [phone] or [phone]       Fax: [phone]   RxID:   [id number] CORTISPORIN 3.5-10000-1  SUSP  [facility]) 2-3 drops 4 x a day after wicking out your ear.  1 bottle  #1 x 2   Entered and Authorized by:   Druscilla Brownie MD   Signed by:   Druscilla Brownie MD on 05/05/2007   Method used:   Elect[facility]ically [facility] to ...       [facility]  [address] [id number]       [address]       [place], [place]  [address]       Ph: [phone] or [phone]       Fax: [phone]   RxID:   [id number]  ]

## 2010-03-07 NOTE — Letter (Signed)
Summary: Historic Patient File/[facility]  Historic Patient File/[facility]   Imported By: Joylene Igo 08/22/2006 05:41:30  _____________________________________________________________________  External Attachment:    Type:   Image     Comment:   External Document

## 2010-03-07 NOTE — Assessment & Plan Note (Signed)
Summary: FU PER WORKIN DR/KH   Vital Signs:  Patient Profile:   77 Years Old Female Height:     64 inches (162.56 cm) Weight:      157.6 pounds BMI:     27.15 Temp:     98.2 degrees F oral Pulse rate:   58 / minute BP sitting:   138 / 75  (left arm) Cuff size:   regular  Vitals Entered By: Levert Feinstein LPN (May 15, 579FGE [id number] AM)                 PCP:  Talbert Cage MD  Chief Complaint:  little better.  History of Present Illness: Hx  HTN, Hypothyroid, systolic CHF EF< [id number]  Treated for staph otitis 06/09/07 with bactrim x 7 days.  Had + clx 05/09/07  Presents for follow-up vomitting/ dehydration-   says she is a little better.  no vomiting since 2 days.  normal bowel movments, no fever.  still feels tired and weak.  extensive history of otits media s/p tubes and attempted TM patch per Dr Constance Holster ENT ( 04/2007).  Has seen Dr Johnnye Sima with ID previously.  Has a history of MSSA infection that responded to a combo of keflex x 2 mos and cortisporin ear drops.  No preceding URTI.  Hearing is decreased chronically.  Seen PCP 1 week ago and placed on Bactrim ds two times a day.  States nonbilious nonbloddy emesis 2-3 times daily since starting Bactrim.  No fever, no diarrhea. Started taking med with food and this helped but she began vomitting again.   now off bactrim x 2 days.  Hypotensive- has held a lot of her bp medicines. taking a little bit mroe by mouth.  Hyperkalemia- 5.4 on CMET 06/09/07.  and 5.9 2 days ago.   On ACE and aldactone and K - supplement per med list - held for a few days.      Current Allergies: ! * TRAMADOL      Physical Exam  General:     Well-developed,well-nourished,in no acute distress; alert,appropriate and cooperative throughout examination Mouth:     mmm Lungs:     Normal respiratory effort, chest expands symmetrically. Lungs are clear to auscultation, no crackles or wheezes. Heart:     Normal rate and regular rhythm. S1 and S2 normal without  gallop, murmur, click, rub or other extra sounds. Skin:     good skin turgor adn capp refill.    Impression & Recommendations:  Problem # 1:  DEHYDRATION (ICD-276.51) Assessment: Improved improved.  creat impoved on bmet.  encouraged continued push by mouth fluids but to take water and not gatoratde (put gatorade in instructions but changed when i called her).  Orders: [place]- Est  Level 4 [id number])   Problem # 2:  HYPERKALEMIA (ICD-276.7) Assessment: Unchanged d/w dr. Erin Hearing and he saw pt.  will recheck monday.  unsure why its still up.   Orders: Basic Met-[facility] [id number]) [place]- Est  Level 4 [id number])   Problem # 3:  VOMITING (ICD-787.03) Assessment: Improved resolved with phenergan. Orders: [place]- Est  Level 4 (99214)   Complete Medication List: 1)  Altace 10 Mg Caps (Ramipril) .... Take 1 tablet by mouth once a day 2)  Crestor 20 Mg Tabs (Rosuvastatin calcium) .... Take 1 tablet by mouth once a day 3)  Coreg 25 Mg Tabs (Carvedilol) .... One tab by mouth two times a day 4)  Aldactone 25 Mg Tabs (Spironolactone) .... Take 1 tablet by mouth  once a day 5)  Demadex 20 Mg Tabs (Torsemide) .... One tablet every other day for fluid 6)  Klor-con M20 20 Meq Tbcr (Potassium chloride crys cr) .... One once a week 7)  Adprin B 325 Mg Tabs (Aspirin buf(cacarb-mgcarb-mgo)) .... Take 1 tablet by mouth once a day 8)  Bl Vitamin C 500 Mg Tabs (Ascorbic acid) .... Once daily 9)  Vitamin E Natural 400 Unit Caps (Vitamin e) .... Take 1 tablet by mouth once a day 10)  Omega-3 350 Mg Caps (Omega-3 fatty acids) .... 1000mg  once daily 11)  Vicodin 5-500 Mg Tabs (Hydrocodone-acetaminophen) .Marland Kitchen.. 1 by mouth two times a day as needed thumb pain 12)  Allegra-d 12 Hour 60-120 Mg Tb12 (Fexofenadine-pseudoephedrine) .Marland Kitchen.. 1 by mouth two times a day prn 13)  Prilosec 10 Mg Cpdr (Omeprazole) .Marland Kitchen.. 1-2 by mouth at bedtime 14)  Calcium Carbonate-vitamin D 600-400 Mg-unit Tabs (Calcium carbonate-vitamin d) .... 2  daily 15)  Ambien 10 Mg Tabs (Zolpidem tartrate) .... 1/2 -1 by mouth at bedtime as needed take only rarely 16)  Promethazine Hcl 12.5 Mg Tabs (Promethazine hcl) .Marland Kitchen.. 1 tab by mouth every 8 hrs as needed for vomit   Patient Instructions: 1)  Follow up with me, Dr. Hoy Morn next week to make sure that you are feeling better. 2)  Keep drinking lots of fluids.  Attempt oral fluids like gatorade/ ginger ale as well.  Small meals as tolerated. 3)  Continue to stop your Aldactone, Ramipril, Potassium pill and demadex until further notice.  4)  If you continue to vomit, fever, or worsening dehydration report to emergency room.     ]  Vital Signs:  Patient Profile:   77 Years Old Female Height:     64 inches (162.56 cm) Weight:      157.6 pounds BMI:     27.15 Temp:     98.2 degrees F oral Pulse rate:   58 / minute BP sitting:   138 / 75 Cuff size:   regular

## 2010-03-07 NOTE — Assessment & Plan Note (Signed)
Summary: BP follow up/ls   Vital Signs:  Patient profile:   77 year old female Height:      64 inches Weight:      162.9 pounds BMI:     28.06 Temp:     97.4 degrees F oral Pulse rate:   53 / minute BP sitting:   149 / 82  (left arm) Cuff size:   regular  Vitals Entered By: Levert Feinstein LPN (March 17, 624THL [id number] AM) CC: f/u BP Is Patient Diabetic? No Pain Assessment Patient in pain? no        Primary Care Provider:  Talbert Cage MD  CC:  f/u BP.  History of Present Illness: Current Problems:  ANEMIA (ICD-285.9) No bleeding no chest pain or lightheadedness   HYPERTENSION, BENIGN SYSTEMIC (ICD-401.1) Taking medications every day.  No chest pain no lightheadedness  HYPERCHOLESTEROLEMIA (ICD-272.0) Using crestor daily and started on zetia last visit with her cardiologist.  She is worried about her liver.   no right upper quadrant pain or chest pain  HYPERKALEMIA (ICD-276.7) Not taking any pottasium  INSOMNIA (ICD-780.52) continues to bother her intermittently.  Ambien really helps without any side effects.    OTITIS MEDIA, CHRONIC (ICD-382.9)  OSTEOARTHRITIS, MULTI SITES (ICD-715.98) stable not bothering her very much. No joint swelling or disability'  ROS - as above PMH - Medications reviewed and updated in medication list.  Smoking Status noted in VS form     Habits & Providers  Alcohol-Tobacco-Diet     Tobacco Status: never  Allergies: 1)  ! * Tramadol 2)  ! 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 Lungs:  Normal respiratory effort, chest expands symmetrically. Lungs are clear to auscultation, no crackles or wheezes. Heart:  Grade 3/6 sys mumur mild  bradycardia Extremities:  no edema     Impression & Recommendations:  Problem # 1:  ANEMIA (ICD-285.9)  No symptoms will check labs   Hgb: 11.8 (12/06/2008)   Hct: 36.3 (12/06/2008)   Platelets: 238 (12/06/2008) RBC: 3.88 (12/06/2008)   RDW: 13.1 (12/06/2008)   WBC: 10.5 (12/06/2008) MCV: 93.6 (12/06/2008)   MCHC: 32.5 (12/06/2008) Ferritin: 75 (12/06/2008) B12: 1767 (12/06/2008)   TSH: 3.515 (06/09/2007)  Problem # 2:  HYPERTENSION, BENIGN SYSTEMIC (ICD-401.1) Stable on current medications.  Was higher in our office than home readings or at cardiologists will follow  Her updated medication list for this problem includes:    Altace 10 Mg Caps (Ramipril) .Marland Kitchen... Take 1 tablet by mouth once a day    Coreg 25 Mg Tabs (Carvedilol) ..... One tab by mouth two times a day    Aldactone 25 Mg Tabs (Spironolactone) .Marland Kitchen... Take 1 tablet by mouth once a day    Demadex 20 Mg Tabs (Torsemide) ..... One tablet every other day for fluid  Orders: Comp Met-[facility] [id number]) CBC-[facility] [id number]) [place]- Est  Level 4 (99214)  BP today: 149/82 Prior BP: 165/84 (11/15/2008)  Labs Reviewed: K+: 5.1 (12/06/2008) Creat: : 1.35 (12/06/2008)   Chol: 195 (05/14/2008)   HDL: 43 (05/14/2008)   LDL: 100 (05/14/2008)   TG: 260 (05/14/2008)  Problem # 3:  HYPERCHOLESTEROLEMIA (ICD-272.0) Is on max crestor with recently added zetia.  She is concerned about how it may effect her liver.  Will check labs  Her  updated medication list for this problem includes:    Crestor 20 Mg Tabs (Rosuvastatin calcium) .Marland Kitchen... Take 1 tablet by mouth once a day    Zetia 10 Mg Tabs (Ezetimibe) .Marland Kitchen... 1 by mouth daily  Orders: Lipid-[facility] [id number]) [facility]- Est  Level 4 [id number])  Labs Reviewed: SGOT: 19 (11/15/2008)   SGPT: 13 (11/15/2008)   HDL:43 (05/14/2008), 41 (11/07/2006)  LDL:100 (05/14/2008), 94 (11/07/2006)  Chol:195 (05/14/2008), 189 (11/07/2006)  Trig:260 (05/14/2008), 270 (11/07/2006)  Problem # 4:  HYPERKALEMIA  (ICD-276.7)  Check today.    Orders: [place]- Est  Level 4 [id number])  Problem # 5:  INSOMNIA (ICD-780.52) discussed risks of ambien including falls and confusion.  She understands but still wants to have it to take occaisional when she really needs it  Her updated medication list for this problem includes:    Ambien 5 Mg Tabs (Zolpidem tartrate) .Marland Kitchen... 1 tablet occasionally at pm for sleep  Complete Medication List: 1)  Altace 10 Mg Caps (Ramipril) .... Take 1 tablet by mouth once a day 2)  Crestor 20 Mg Tabs (Rosuvastatin calcium) .... Take 1 tablet by mouth once a day 3)  Coreg 25 Mg Tabs (Carvedilol) .... One tab by mouth two times a day 4)  Aldactone 25 Mg Tabs (Spironolactone) .... Take 1 tablet by mouth once a day 5)  Demadex 20 Mg Tabs (Torsemide) .... One tablet every other day for fluid 6)  Adprin B 325 Mg Tabs (Aspirin buf(cacarb-mgcarb-mgo)) .... Take 1 tablet by mouth once a day 7)  Bl Vitamin C 500 Mg Tabs (Ascorbic acid) .... Once daily 8)  Vitamin E Natural 400 Unit Caps (Vitamin e) .... Take 1 tablet by mouth once a day 9)  Omega-3 350 Mg Caps (Omega-3 fatty acids) .... 1000mg  two times a day 10)  Vicodin 5-500 Mg Tabs (Hydrocodone-acetaminophen) .Marland Kitchen.. 1 by mouth two times a day as needed for shoulder pain 11)  Prilosec 10 Mg Cpdr (Omeprazole) .Marland Kitchen.. 1-2 by mouth at bedtime 12)  Calcium Carbonate-vitamin D 600-400 Mg-unit Tabs (Calcium carbonate-vitamin d) .... 2 daily 13)  Triamcinolone Acetonide 0.1 % Crea (Triamcinolone acetonide) .... Apply as needed for rash.  if use more than 10 days call us 30 gm 14)  Ambien 5 Mg Tabs (Zolpidem tartrate) .Marland Kitchen.. 1 tablet occasionally at pm for sleep 15)  Zetia 10 Mg Tabs (Ezetimibe) .Marland Kitchen.. 1 by mouth daily  Patient Instructions: 1)  Please schedule a follow-up appointment in 3 months .  2)  I will call you if your lab is abnormal otherwise I will send you a letter within 2 weeks. 3)  Get Low Sodium meals and try to avoid salt 4)  Write down  your blood pressure readings at call us once you have 3 readings  5)  See if you qualify for the Shingles Shot   Prevention & Chronic Care Immunizations   Influenza vaccine: Fluvax 3+  (11/15/2008)   Influenza vaccine due: 11/07/2007    Tetanus booster: 02/06/1987: Done.   Tetanus booster due: 02/05/1997    Pneumococcal vaccine: Done.  (01/06/1995)   Pneumococcal vaccine due: None    H. zoster vaccine: Not documented  Colorectal Screening   Hemoccult: Done.  (03/08/1996)   Hemoccult due: Not Indicated    Colonoscopy:  Results: Normal. [facility]  (01/26/2008)   Colonoscopy due: 02/2018  Other Screening   Pap smear: Had hysterectormy  (11/10/2007)   Pap smear due: Not Indicated    Mammogram: normal  (04/08/2006)   Mammogram action/deferral:  Refused  (10/07/2008)   Mammogram due: 04/08/2007    DXA bone density scan: Lumbar Spine:  T Score-2.5 to -1.0 Spine.   Hip Total: T Score -2.5 to -1.0 Hip.   Women's    (06/12/2007)   DXA scan due: None    Smoking status: never  (04/21/2009)  Lipids   Total Cholesterol: 195  (05/14/2008)   LDL: 100  (05/14/2008)   LDL Direct: Not documented   HDL: 43  (05/14/2008)   Triglycerides: 260  (05/14/2008)    SGOT (AST): 19  (11/15/2008)   SGPT (ALT): 13  (11/15/2008) CMP ordered    Alkaline phosphatase: 61  (11/15/2008)   Total bilirubin: 0.7  (11/15/2008)    Lipid flowsheet reviewed?: Yes   Progress toward LDL goal: Unchanged  Hypertension   Last Blood Pressure: 149 / 82  (04/21/2009)   Serum creatinine: 1.35  (12/06/2008)   Serum potassium 5.1  (12/06/2008) CMP ordered     Hypertension flowsheet reviewed?: Yes   Progress toward BP goal: Unchanged  Self-Management Support :   Personal Goals (by the next clinic visit) :      Personal blood pressure goal: 140/90  (04/21/2009)     Personal LDL goal: 70  (04/21/2009)    Patient will work on the following items until the next clinic visit to reach self-care  goals:     Eating: eat more vegetables, eat baked foods instead of fried foods  (10/07/2008)     Other: Low  (04/21/2009)    Hypertension self-management support: Not documented    Hypertension self-management support not done because: Good outcomes  (11/15/2008)    Lipid self-management support: Written self-care plan  (10/07/2008)

## 2010-03-07 NOTE — Progress Notes (Signed)
Summary: meds   Phone Note Call from Patient   Caller: Patient Summary of Call: pt states that the renapril has not been called in. Initial call taken by: Audie Clear,  May 11, 2008 11:23 AM  Follow-up for Phone Call        called pharmacy but Rx was not received for ramipril. verbally given for # 30 tabs and total of 4 refills. patient notified. Follow-up by: Marcell Barlow RN,  May 11, 2008 11:28 AM

## 2010-03-07 NOTE — Assessment & Plan Note (Signed)
Summary: ear & hand wp   Vital Signs:  Patient Profile:   77 Years Old Female Height:     64 inches (162.56 cm) Weight:      162.9 pounds (74.05 kg) BMI:     28.06 Temp:     98.3 degrees F (36.83 degrees C) Pulse rate:   59 / minute BP sitting:   132 / 72  (right arm)  Pt. in pain?   no  Vitals Entered By: Dalbert Mayotte (Jun 09, 2007 1:23 PM)                 Last Flex Sig:  Done. (03/08/1996 12:00:00 AM) Flex Sig Next Due:  Not Indicated Last Hemoccult Result: Done. (03/08/1996 12:00:00 AM) Hemoccult Next Due:  Not Indicated Last PAP:  Done. (11/05/1992 12:00:00 AM) PAP Next Due:  Not Indicated   Chief Complaint:  R ear infection f/u and L hand pain.  History of Present Illness: R EAR Drainage still draining bloody discharge. Did not really change with oral or topical abx which she completely finished.   No fever or pain or neck swelling.    HYPOTHYROID was this by lab tests a few years ago.  Has not been on replacment since her blood work normalized.  She feels well without heat intolerance or weight change.  HYPERTENSION Disease Monitoring   Blood pressure range:does not take at home      Chest pain: N     Dyspnea:N Medications   Compliance: daily all her meds which she knows   Lightheadedness: N     Edema:N Prevention   Exercise: not regularly   Seeing Dr Melvern Banker in 1 month  INSOMNIA uses ambien rarely when trouble falling asleep.  Last Rx lasted for a year.  No depression symptoms  ROS - as above PMH - Medications reviewed and updated in medication list.          Current Allergies: ! * TRAMADOL      Physical Exam  General:     Well-developed,well-nourished,in no acute distress; alert,appropriate and cooperative throughout examination Ears:     Left TM clear with a small perforation in ear  Right TM with a larger perforation and cloudy with small amount of bloody clear discharge in floor of canal.  No pain with palpation Neck:     No  deformities, masses, or tenderness noted.  No adenopathty Lungs:     Normal respiratory effort, chest expands symmetrically. Lungs are clear to auscultation, no crackles or wheezes. Heart:     normal rate, regular rhythm, and Grade  3 /6 systolic ejection murmur.   Extremities:     No  edema,     Impression & Recommendations:  Problem # 1:  OTITIS MEDIA, CHRONIC (ICD-382.9) Trial of high dose bactrim and wicking.  This is a chronic recurrent condition.  Hopefully can control   The following medications were removed from the medication list:    Cephalexin 500 Mg Tabs (Cephalexin) ..... One tab by mouth bid  Her updated medication list for this problem includes:    Adprin B 325 Mg Tabs (Aspirin buf(cacarb-mgcarb-mgo)) .Marland Kitchen... Take 1 tablet by mouth once a day    Septra Ds 800-160 Mg Tabs (Sulfamethoxazole-trimethoprim) .Marland Kitchen... 2  by mouth twice daily for 7 days  Orders: [facility]- Est  Level 4 [id number])   Problem # 2:  HYPOTHYROIDISM, UNSPECIFIED (ICD-244.9) Will recheck labs.  If normal will remove from her problem list.  Was likely due to illness  in past Orders: [facility]- Est  Level 4 ([id number]) [id number])   Problem # 3:  HYPERTENSION, BENIGN SYSTEMIC (ICD-401.1) Controlled.  Check BMet given her many medications to check creatinine and potassium  Her updated medication list for this problem includes:    Altace 10 Mg Caps (Ramipril) .Marland Kitchen... Take 1 tablet by mouth once a day    Coreg 25 Mg Tabs (Carvedilol) ..... One tab by mouth two times a day    Aldactone 25 Mg Tabs (Spironolactone) .Marland Kitchen... Take 1 tablet by mouth once a day    Demadex 20 Mg Tabs (Torsemide) ..... One tablet every other day for fluid  Orders: [facility] [id number]) [facility]- Est  Level 4 [id number])   Problem # 4:  INSOMNIA (ICD-780.52) Situational, no signs of depression. Ambien  for unusual times.  Discussed to use intermittenlty Her updated medication list for this problem includes:    Ambien 10 Mg Tabs (Zolpidem  tartrate) .Marland Kitchen... 1/2 -1 by mouth at bedtime as needed take only rarely   Complete Medication List: 1)  Altace 10 Mg Caps (Ramipril) .... Take 1 tablet by mouth once a day 2)  Crestor 20 Mg Tabs (Rosuvastatin calcium) .... Take 1 tablet by mouth once a day 3)  Coreg 25 Mg Tabs (Carvedilol) .... One tab by mouth two times a day 4)  Aldactone 25 Mg Tabs (Spironolactone) .... Take 1 tablet by mouth once a day 5)  Demadex 20 Mg Tabs (Torsemide) .... One tablet every other day for fluid 6)  Klor-con M20 20 Meq Tbcr (Potassium chloride crys cr) .... One once a week 7)  Adprin B 325 Mg Tabs (Aspirin buf(cacarb-mgcarb-mgo)) .... Take 1 tablet by mouth once a day 8)  Bl Vitamin C 500 Mg Tabs (Ascorbic acid) .... Once daily 9)  Vitamin E Natural 400 Unit Caps (Vitamin e) .... Take 1 tablet by mouth once a day 10)  Omega-3 350 Mg Caps (Omega-3 fatty acids) .... 1000mg  once daily 11)  Vicodin 5-500 Mg Tabs (Hydrocodone-acetaminophen) .Marland Kitchen.. 1 by mouth two times a day as needed thumb pain 12)  Allegra-d 12 Hour 60-120 Mg Tb12 (Fexofenadine-pseudoephedrine) .Marland Kitchen.. 1 by mouth two times a day prn 13)  Prilosec 10 Mg Cpdr (Omeprazole) .Marland Kitchen.. 1-2 by mouth at bedtime 14)  Septra Ds 800-160 Mg Tabs (Sulfamethoxazole-trimethoprim) .... 2  by mouth twice daily for 7 days 15)  Calcium Carbonate-vitamin D 600-400 Mg-unit Tabs (Calcium carbonate-vitamin d) .... 2 daily 16)  Ambien 10 Mg Tabs (Zolpidem tartrate) .... 1/2 -1 by mouth at bedtime as needed take only rarely  Other Orders: Dexa scan (Dexa scan)   Patient Instructions: 1)  Please schedule a follow-up appointment in 6 months. 2)  Elza Rafter out your ear 4 x a day 3)  Take alll your antibiotic 4)  I will call you if your lab is abnormal otherwise I will send you a letter within 2 weeks. 5)  Will call about your colonscopy    Prescriptions: AMBIEN 10 MG  TABS (ZOLPIDEM TARTRATE) 1/2 -1 by mouth at bedtime as needed take only rarely  #30 x 0   Entered and  Authorized by:   Talbert Cage MD   Signed by:   Talbert Cage MD on 06/09/2007   Method used:   Handwritten   [id number] ALLEGRA-D 12 HOUR 60-120 MG TB12 (FEXOFENADINE-PSEUDOEPHEDRINE) 1 by mouth two times a day prn  #60 x 6   Entered and Authorized by:   Talbert Cage MD   Signed  by:   MARSHALL CHAMBLISS MD on 06/09/2007   Method used:   Electronically sent to ...       [facility]  [address] [id number]       [address]       [place], [place]  [address]       Ph: [phone] or [phone]       Fax: [phone]   RxID:   [id number] VICODIN 5-500 MG TABS (HYDROCODONE-ACETAMINOPHEN) 1 by mouth two times a day as needed thumb pain  #30 x 3   Entered and Authorized by:   Talbert Cage MD   Signed by:   Talbert Cage MD on 06/09/2007   Method used:   Handwritten   [id number] DEMADEX 20 MG  TABS (TORSEMIDE) One tablet every other day for fluid  #30 x 6   Entered and Authorized by:   Talbert Cage MD   Signed by:   Talbert Cage MD on 06/09/2007   Method used:   Electronically sent to ...       [facility]  [address] [id number]       [address]       [place], [place]  [address]       Ph: [phone] or [phone]       Fax: [phone]   RxID:   [id number] SEPTRA DS 800-160 MG TABS (SULFAMETHOXAZOLE-TRIMETHOPRIM) 2  by mouth twice daily for 7 days  #30 x 1   Entered and Authorized by:   Talbert Cage MD   Signed by:   Talbert Cage MD on 06/09/2007   Method used:   Electronically sent to ...       [facility]  [address] [id number]       [address]       [place], [place]  [address]       Ph: [phone] or [phone]       Fax: [phone]   RxID:   [id number]  ]

## 2010-03-07 NOTE — Assessment & Plan Note (Signed)
Summary: bp ck,tcb  Nurse Visit patient came in for BP check because she had BP checked where she lives today and reading were elevated 187/85. BP checked today  RA 170/80, LA 160/80,  pulse 60. she has taken her meds as directed.  states she feels fine. appointment scheduled with Dr. Erin Hearing for 04/21/2009. Dr. Erin Hearing notified of findings. Marcell Barlow RN  April 07, 2009 4:43 PM'  Allergies: 1)  ! * Tramadol 2)  ! Septra  Orders Added: 1)  No Charge Patient Arrived (NCPA0) [NCPA0]

## 2010-03-07 NOTE — Assessment & Plan Note (Signed)
Summary: FU VISIT/PER DR HATCHER/VS    Chief Complaint:  follow up visit: ear infxn.  History of Present Illness: 77 yo F with otitis, prev cx+ for MSSA.  States she no longer has ear d/c. Finnished keflex last week.  No fevers. No headaches. Hearing is "so-so".  No swelling around ears or in neck. No rashes or loose BM from anbx.    Current Allergies (reviewed today): ! * TRAMADOL    Risk Factors: Tobacco use:  never Alcohol use:  no  Colonoscopy History:    Date of Last Colonoscopy:  03/08/1996  Mammogram History:    Date of Last Mammogram:  04/08/2006  PAP Smear History:    Date of Last PAP Smear:  11/05/1992    Vital Signs:  Patient Profile:   77 Years Old Female Height:     64 inches (162.56 cm) Weight:      162.3 pounds (73.77 kg) BMI:     27.96 Temp:     97.2 degrees F (36.22 degrees C) oral Pulse rate:   60 / minute BP sitting:   162 / 79  (right arm)  Pt. in pain?   no  Vitals Entered By: Tor Netters RN (October 23, 2006 9:00 AM)              Is Patient Diabetic? No Nutritional Status BMI of 25 - 29 = overweight  Have you ever been in a relationship where you felt threatened, hurt or afraid?No   Does patient need assistance? Functional Status Self care Ambulation Normal     Physical Exam  General:     alert, well-developed, and well-nourished.   Ears:     TMs have some discoloration There is no d/c in the canals.  non-tender    Impression & Recommendations:  Problem # 1:  OTITIS MEDIA, CHRONIC (ICD-382.9) she has completed her keflex.  She appears to be doing well- no further d/c, no tenderness, no f/c.   she will follow up as needed.   Her updated medication list for this problem includes:    Adprin B 325 Mg Tabs (Aspirin buf(cacarb-mgcarb-mgo)) .Marland Kitchen... Take 1 tablet by mouth once a day  CC: Dr Azzie Roup, Dr Elie Goody      ]

## 2010-03-07 NOTE — Assessment & Plan Note (Signed)
Summary: f/u,df   Vital Signs:  Patient profile:   77 year old female Height:      64 inches Weight:      163.5 pounds BMI:     28.17 Temp:     98.2 degrees F oral Pulse rate:   54 / minute BP sitting:   156 / 72  (left arm) Cuff size:   regular  Vitals Entered By: Levert Feinstein LPN (June  1, 624THL [id number] AM)  Serial Vital Signs/Assessments:  Comments: 10:34 AM Manual BP: 144/88 By: Levert Feinstein LPN   CC: f/u bp Is Patient Diabetic? No Pain Assessment Patient in pain? no        Primary Care Provider:  Talbert Cage MD  CC:  f/u bp.  History of Present Illness: HYPERTENSION Disease Monitoring   Blood pressure range:see recent note.  Has not taken since       Chest pain: N     Dyspnea:N Medications   Compliance: daily   Lightheadedness: N     Edema:trace Does feel dizzy for a brief duration sometimes when sitting not with standing or exercise  Prevention   Exercise: walks      Sores in head areas of crusty lesions that are itchy in her scalp.  Using Tgel but not helping.  has had before.  No fever or discharge or tenderness  Cholesterol taking medications without problems.  No right upper quadrant pain or muscle aches   ROS - as above PMH - Medications reviewed and updated in medication list.  Smoking Status noted in VS form    Habits & Providers  Alcohol-Tobacco-Diet     Tobacco Status: never  Current Medications (verified): 1)  Altace 10 Mg  Caps (Ramipril) .... Take 1 Tablet By Mouth Once A Day 2)  Crestor 20 Mg  Tabs (Rosuvastatin Calcium) .... Take 1 Tablet By Mouth Once A Day 3)  Coreg 25 Mg  Tabs (Carvedilol) .... One Tab By Mouth Two Times A Day 4)  Aldactone 25 Mg  Tabs (Spironolactone) .... Take 1 Tablet By Mouth Once A Day 5)  Demadex 20 Mg  Tabs (Torsemide) .... One Tablet Every Other Day For Fluid 6)  Adprin B 325 Mg  Tabs (Aspirin Buf(Cacarb-Mgcarb-Mgo)) .... Take 1 Tablet By Mouth Once A Day 7)  Bl Vitamin C 500 Mg  Tabs (Ascorbic  Acid) .... Once Daily 8)  Vitamin E Natural 400 Unit  Caps (Vitamin E) .... Take 1 Tablet By Mouth Once A Day 9)  Omega-3 350 Mg  Caps (Omega-3 Fatty Acids) .... 1000mg  Two Times A Day 10)  Vicodin 5-500 Mg Tabs (Hydrocodone-Acetaminophen) .Marland Kitchen.. 1 By Mouth Two Times A Day As Needed For Shoulder Pain 11)  Prilosec 10 Mg  Cpdr (Omeprazole) .Marland Kitchen.. 1-2 By Mouth At Bedtime 12)  Calcium Carbonate-Vitamin D 600-400 Mg-Unit  Tabs (Calcium Carbonate-Vitamin D) .... 2 Daily 13)  Ambien 5 Mg Tabs (Zolpidem Tartrate) .Marland Kitchen.. 1 Tablet Occasionally At Pm For Sleep 14)  Zetia 10 Mg Tabs (Ezetimibe) .Marland Kitchen.. 1 By Mouth Daily  Allergies: 1)  ! * Tramadol 2)  ! Septra  Physical Exam  General:  Well-developed,well-nourished,in no acute distress; alert,appropriate and cooperative throughout examination Lungs:  Normal respiratory effort, chest expands symmetrically. Lungs are clear to auscultation, no crackles or wheezes. Heart:  Grade 3/6 sys mumur mild bradycardia Extremities:  trace edema  Skin:  scalp - scattered areas of skin colore papules with crusting.  No redness or tenderness or discharge  Cervical Nodes:  No lymphadenopathy noted   Impression & Recommendations:  Problem # 1:  HYPERTENSION, BENIGN SYSTEMIC (ICD-401.1) Assessment Unchanged  On recheck was 144/88.  Given her lower diastolics and large number of medications will monitor and not change at this time  Her updated medication list for this problem includes:    Altace 10 Mg Caps (Ramipril) .Marland Kitchen... Take 1 tablet by mouth once a day    Coreg 25 Mg Tabs (Carvedilol) ..... One tab by mouth two times a day    Aldactone 25 Mg Tabs (Spironolactone) .Marland Kitchen... Take 1 tablet by mouth once a day    Demadex 20 Mg Tabs (Torsemide) ..... One tablet every other day for fluid  BP today: 156/72 Prior BP: 149/82 (04/21/2009)  Labs Reviewed: K+: 5.1 (04/21/2009) Creat: : 1.48 (04/21/2009)   Chol: 175 (04/21/2009)   HDL: 41 (04/21/2009)   LDL: 81 (04/21/2009)   TG:  265 (04/21/2009)  Orders: [facility]- Est  Level 4 [id number])  Problem # 2:  SEBORRHEA (ICD-706.3) Assessment: Deteriorated  I think her scalp condition is a variant of seborrhea.  Does not look infectious.  Wil treat with steroid and follow.  If persists may need to be biopsed  Orders: [facility]- Est  Level 4 ([address])  Problem # 3:  HYPERCHOLESTEROLEMIA (ICD-272.0) she is concerned about her liver given new mediation of zetia.  Will check liver function tests  Her updated medication list for this problem includes:    Crestor 20 Mg Tabs (Rosuvastatin calcium) .Marland Kitchen... Take 1 tablet by mouth once a day    Zetia 10 Mg Tabs (Ezetimibe) .Marland Kitchen... 1 by mouth daily  Complete Medication List: 1)  Altace 10 Mg Caps (Ramipril) .... Take 1 tablet by mouth once a day 2)  Crestor 20 Mg Tabs (Rosuvastatin calcium) .... Take 1 tablet by mouth once a day 3)  Coreg 25 Mg Tabs (Carvedilol) .... One tab by mouth two times a day 4)  Aldactone 25 Mg Tabs (Spironolactone) .... Take 1 tablet by mouth once a day 5)  Demadex 20 Mg Tabs (Torsemide) .... One tablet every other day for fluid 6)  Adprin B 325 Mg Tabs (Aspirin buf(cacarb-mgcarb-mgo)) .... Take 1 tablet by mouth once a day 7)  Bl Vitamin C 500 Mg Tabs (Ascorbic acid) .... Once daily 8)  Vitamin E Natural 400 Unit Caps (Vitamin e) .... Take 1 tablet by mouth once a day 9)  Omega-3 350 Mg Caps (Omega-3 fatty acids) .... 1000mg  two times a day 10)  Vicodin 5-500 Mg Tabs (Hydrocodone-acetaminophen) .Marland Kitchen.. 1 by mouth two times a day as needed for shoulder pain 11)  Prilosec 10 Mg Cpdr (Omeprazole) .Marland Kitchen.. 1-2 by mouth at bedtime 12)  Calcium Carbonate-vitamin D 600-400 Mg-unit Tabs (Calcium carbonate-vitamin d) .... 2 daily 13)  Ambien 5 Mg Tabs (Zolpidem tartrate) .Marland Kitchen.. 1 tablet occasionally at pm for sleep 14)  Zetia 10 Mg Tabs (Ezetimibe) .Marland Kitchen.. 1 by mouth daily  Other Orders: [facility] [id number])  Patient Instructions: 1)  Please schedule a follow-up appointment in 6  months .  2)  Call if your blood pressure is regularly > 155/90 3)  Call if dizzy or lightheadedness gets worse 4)  I will call you if your lab is abnormal otherwise I will send you a letter within 2 weeks. 5)  Use the triamcinolone cream two times a day for 10 days for your scalp sores 6)  Call about the Shingles Shot   Prevention & Chronic Care Immunizations   Influenza vaccine: Fluvax  3+  (11/15/2008)   Influenza vaccine due: 11/07/2007    Tetanus booster: 02/06/1987: Done.   Tetanus booster due: 02/05/1997    Pneumococcal vaccine: Done.  (01/06/1995)   Pneumococcal vaccine due: None    H. zoster vaccine: Not documented  Colorectal Screening   Hemoccult: Done.  (03/08/1996)   Hemoccult due: Not Indicated    Colonoscopy:  Results: Normal. [facility]  (01/26/2008)   Colonoscopy due: 02/2018  Other Screening   Pap smear: Had hysterectormy  (11/10/2007)   Pap smear due: Not Indicated    Mammogram: normal  (04/08/2006)   Mammogram action/deferral: Refused  (10/07/2008)   Mammogram due: 04/08/2007    DXA bone density scan: Lumbar Spine:  T Score-2.5 to -1.0 Spine.   Hip Total: T Score -2.5 to -1.0 Hip.   Women's    (06/12/2007)   DXA scan due: None    Smoking status: never  (07/06/2009)  Lipids   Total Cholesterol: 175  (04/21/2009)   LDL: 81  (04/21/2009)   LDL Direct: Not documented   HDL: 41  (04/21/2009)   Triglycerides: 265  (04/21/2009)    SGOT (AST): 23  (04/21/2009)   SGPT (ALT): 20  (04/21/2009) CMP ordered    Alkaline phosphatase: 70  (04/21/2009)   Total bilirubin: 1.1  (04/21/2009)    Lipid flowsheet reviewed?: Yes   Progress toward LDL goal: Unchanged  Hypertension   Last Blood Pressure: 156 / 72  (07/06/2009)   Serum creatinine: 1.48  (04/21/2009)   Serum potassium 5.1  (04/21/2009) CMP ordered     Hypertension flowsheet reviewed?: Yes   Progress toward BP goal: At goal  Self-Management Support :   Personal Goals (by the next  clinic visit) :      Personal blood pressure goal: 140/90  (04/21/2009)     Personal LDL goal: 70  (04/21/2009)    Hypertension self-management support: Not documented    Hypertension self-management support not done because: Good outcomes  (11/15/2008)    Lipid self-management support: Written self-care plan  (10/07/2008)

## 2010-03-07 NOTE — Progress Notes (Signed)
Summary: Problems with Septra DS  Phone Note Call from Patient   Summary of Call: Pt states c/o "whole body shaking" for 2 hours after taking the Septra DS. She is not sure exactly how soon the shaking stated after taking the medication but states it was the only medication she had taken that morning.  This happened on Thurs and she has not taken a dose since. She states she had episode of cold chill with the shaking.  Please advise.  Initial call taken by: Orland Mustard RN,  March 15, 2008 10:01 AM  Follow-up for Phone Call        will start her on kelfex. her Cx grew MSSA.     New/Updated Medications: KEFLEX 500 MG CAPS (CEPHALEXIN) Take 1 tablet by mouth 4 times a day   Prescriptions: KEFLEX 500 MG CAPS (CEPHALEXIN) Take 1 tablet by mouth 4 times a day  #120 x 1   Entered and Authorized by:   Bobby Rumpf MD   Signed by:   Bobby Rumpf MD on 03/15/2008   Method used:   Electronically to        [id number] (retail)       [address]       [place], [place]  [address]       Ph: [phone] or [phone]       Fax: [phone]   RxID:   [id number]   Appended Document: Document Septra Allergy

## 2010-03-07 NOTE — Assessment & Plan Note (Signed)
Summary: FU VISIT/PER DR HATCHER/VS  Medications Added KEFLEX 500 MG CAP (CEPHALEXIN) Take 1 capsule by mouth twor times a day X 30 days CORTISPORIN 3.5-10000-1  SOLN (NEOMYCIN-POLYMYXIN-HC) apply to each ear two times a day        Chief Complaint:  f/u ov.  History of Present Illness: 77 yo F with otitis, prev cx+ for MSSA. Continues to have dry d/c from ear in AM. Doesn't hurt, no fevers. hearing unchanged. Will complete the course of Keflex at the end of this week. Can't tell any diference since she has been on anbx. Has had no difficulty with the anbx as well- no rash, no yeast infx, no diarrhea.   Current Allergies: ! * TRAMADOL    Risk Factors: Tobacco use:  never Alcohol use:  no  Colonoscopy History:    Date of Last Colonoscopy:  03/08/1996  Mammogram History:    Date of Last Mammogram:  04/08/2006  PAP Smear History:    Date of Last PAP Smear:  11/05/1992    Vital Signs:  Patient Profile:   77 Years Old Female Height:     64 inches (162.56 cm) Weight:      162.9 pounds BMI:     28.06 Temp:     97.3 degrees F oral Pulse rate:   143 / minute BP sitting:   153 / 78  Pt. in pain?   yes    Location:   l hand    Intensity:   4    Type:       aching  Vitals Entered By: Orland Mustard RN (September 18, 2006 9:05 AM)              Is Patient Diabetic? No  Does patient need assistance? Functional Status Self care Ambulation Normal   Physical Exam  General:     alert, well-developed, well-nourished, and well-hydrated.   Eyes:     pupils equal, pupils round, and pupils reactive to light.   Ears:     d/c in canals bilaterally, L greater than right. non-teder. d/c at os in her TMs as well.     Impression & Recommendations:  Problem # 1:  OTITIS MEDIA, CHRONIC (ICD-382.9) wll continue her on the keflex and add anbx ear drops. will hae her back in 1 month to re-assess.  Her updated medication list for this problem includes:    Adprin B 325 Mg Tabs  (Aspirin buf(cacarb-mgcarb-mgo)) .Marland Kitchen... Take 1 tablet by mouth once a day    Keflex 500 Mg Cap (Cephalexin) .Marland Kitchen... Take 1 capsule by mouth twor times a day x 30 days  Orders: Est. Patient Level IV [id number])   Medications Added to Medication List This Visit: 1)  Keflex 500 Mg Cap (Cephalexin) .... Take 1 capsule by mouth twor times a day x 30 days 2)  Cortisporin 3.5-10000-1 Soln (Neomycin-polymyxin-hc) .... Apply to each ear two times a day   Patient Instructions: 1)  Please schedule a follow-up appointment in 1 month.    Prescriptions: KEFLEX 500 MG CAP (CEPHALEXIN) Take 1 capsule by mouth twor times a day X 30 days  #60 x 0   Entered and Authorized by:   Bobby Rumpf MD   Signed by:   Bobby Rumpf MD on 09/18/2006   Method used:   Samples Given   RxID:   [id number]

## 2010-03-09 NOTE — Letter (Signed)
Summary: Generic Letter  [facility]  [address]   [place], [place] [address]   Phone: [phone]  Fax: [phone]     02/27/2010  [place], [place]  [address]  Dear Jodi [name],  We are happy to let you know that since you are covered under Medicare you are able to have a FREE visit at the [facility] to discuss your HEALTH. This is a new benefit for Medicare.  There will be no co-payment.  At this visit you will meet with Lamont Dowdy an expert in wellness and the health coach at our clinic.  At this visit we will discuss ways to keep you healthy and feeling well.  This visit will not replace your regular doctor visit and we cannot refill medications.     You will need to plan to be here at least one hour to talk about your medical history, your current status, review all of your medications, and discuss your future plans for your health.  This information will be entered into your record for your doctor to have and review.  If you are interested in staying healthy, this type of visit can help.  Please call the office at: [phone], to schedule a "Medicare Wellness Visit".  The day of the visit you should bring in all of your medications, including any vitamins, herbs, over the counter products you take.  Make a list of all the other doctors that you see, so we know who they are. If you have any other health documents please bring them.  We look forward to helping you stay healthy.    Sincerely,   Suzanne Lineberry [place]

## 2010-03-22 ENCOUNTER — Encounter: Payer: Self-pay | Admitting: Family Medicine

## 2010-03-22 ENCOUNTER — Ambulatory Visit (INDEPENDENT_AMBULATORY_CARE_PROVIDER_SITE_OTHER): Payer: PRIVATE HEALTH INSURANCE | Admitting: Family Medicine

## 2010-03-22 DIAGNOSIS — I5022 Chronic systolic (congestive) heart failure: Secondary | ICD-10-CM

## 2010-03-22 DIAGNOSIS — E7800 Pure hypercholesterolemia, unspecified: Secondary | ICD-10-CM

## 2010-03-22 DIAGNOSIS — I10 Essential (primary) hypertension: Secondary | ICD-10-CM

## 2010-03-22 DIAGNOSIS — I2510 Atherosclerotic heart disease of native coronary artery without angina pectoris: Secondary | ICD-10-CM

## 2010-03-22 LAB — COMPREHENSIVE METABOLIC PANEL
ALT: 16 U/L (ref 0–35)
AST: 22 U/L (ref 0–37)
Albumin: 4.6 g/dL (ref 3.5–5.2)
Alkaline Phosphatase: 58 U/L (ref 39–117)
BUN: 33 mg/dL — ABNORMAL HIGH (ref 6–23)
CO2: 24 mEq/L (ref 19–32)
Calcium: 10 mg/dL (ref 8.4–10.5)
Chloride: 103 mEq/L (ref 96–112)
Creat: 1.42 mg/dL — ABNORMAL HIGH (ref 0.40–1.20)
Glucose, Bld: 108 mg/dL — ABNORMAL HIGH (ref 70–99)
Potassium: 5 mEq/L (ref 3.5–5.3)
Sodium: 140 mEq/L (ref 135–145)
Total Bilirubin: 0.9 mg/dL (ref 0.3–1.2)
Total Protein: 7.2 g/dL (ref 6.0–8.3)

## 2010-03-22 LAB — LIPID PANEL
Cholesterol: 155 mg/dL (ref 0–200)
HDL: 42 mg/dL (ref >39)
LDL Cholesterol: 66 mg/dL (ref 0–99)
Total CHOL/HDL Ratio: 3.7 Ratio
Triglycerides: 233 mg/dL — ABNORMAL HIGH (ref <150)
VLDL: 47 mg/dL — ABNORMAL HIGH (ref 0–40)

## 2010-03-22 NOTE — Progress Notes (Signed)
  Subjective:    Patient ID: Jodi [name], female    DOB: 1932-10-17, 77 y.o.   MRN: [id number]  HPI  HYPERTENSION Disease Monitoring Blood pressure range-not checking  Chest pain- No      Dyspnea- No Medications Compliance- daily meds Lightheadedness- No   Edema- NO    Congestive Heart Failure Feels well.  No shortness of breath with exercise and no edema.     HYPERLIPIDEMIA Disease Monitoring See symptoms for Hypertension Medications Compliance- daily crestor RUQ pain- No  Muscle aches- No  ROS See HPI above   PMH Smoking Status noted    Review of Systems     Objective:   Physical Exam  Constitutional: She is oriented to person, place, and time. She appears well-developed.  Neck: Normal range of motion. No thyromegaly present.  Cardiovascular: Normal rate and regular rhythm.   Murmur heard.      Gr 2/6 systolic m  Pulmonary/Chest: No respiratory distress. She has no wheezes. She has no rales.  Musculoskeletal: She exhibits no edema.  Lymphadenopathy:    She has no cervical adenopathy.  Neurological: She is alert and oriented to person, place, and time.          Assessment & Plan:

## 2010-03-22 NOTE — Assessment & Plan Note (Signed)
Stable  Doing remarkably well Continue current meds

## 2010-03-22 NOTE — Patient Instructions (Signed)
You are doing great Keep up the exercise and weight control I will call you if your blood tests are bad ow I will send a letter

## 2010-03-22 NOTE — Assessment & Plan Note (Addendum)
Stable Check FLP.  May considering stopping zetia since no good data on efficacy

## 2010-03-22 NOTE — Assessment & Plan Note (Signed)
Stable. Continue current meds.   

## 2010-03-23 ENCOUNTER — Encounter: Payer: Self-pay | Admitting: Family Medicine

## 2010-04-09 ENCOUNTER — Other Ambulatory Visit: Payer: Self-pay | Admitting: Family Medicine

## 2010-04-10 NOTE — Telephone Encounter (Signed)
Refill request

## 2010-05-24 ENCOUNTER — Other Ambulatory Visit: Payer: Self-pay | Admitting: Family Medicine

## 2010-05-24 NOTE — Telephone Encounter (Signed)
Refill request

## 2010-05-25 NOTE — Telephone Encounter (Signed)
Rx called into [facility] on [address] ([facility] with Rx detail)

## 2010-06-20 NOTE — Consult Note (Signed)
NEW PATIENT CONSULTATION   Jodi [name], Jodi [name]  DOB:  May 18, 1932                                       10/03/2009  [id number]   The patient is a 77 year old female with painful varicosities in both  legs which she has had for many years.  Self-referred for venous  insufficiency.  She has no history of deep venous thrombosis but did  have a remote episode of thrombophlebitis during her fourth pregnancy  many, many years ago on the right leg.  She has occasional burning and  aching discomfort in the right leg worse than the left and occasional  mild swelling in the right ankle.  She has no history of bleeding,  ulceration or other complicating problems.  She wore pantyhose and  compression stockings many years ago but not recently and does  occasionally elevate the legs, with some improvement in her symptoms and  takes no pain medicine.   CHRONIC MEDICAL PROBLEMS:  1. Coronary artery disease, previous coronary artery bypass grafting      in 1995 and 2003.  2. Hypertension.  3. Hyperlipidemia.  4. Negative COPD, stroke or diabetes.   SOCIAL HISTORY:  She is widowed and does not use tobacco or alcohol.   FAMILY HISTORY:  Positive for coronary artery disease and stroke in her  mother, diabetes in a grandson, coronary artery disease in a brother.   REVIEW OF SYSTEMS:  Positive for decreased hearing acuity.  No chest  pain, dyspnea on exertion.  Able to ambulate a mile without  claudication.  Does have a heart murmur.  All other systems are negative  in review of systems.   PHYSICAL EXAMINATION:  Blood pressure 158/78, heart rate 52, temperature  97.  General:  Well-developed, well-nourished female in no apparent  distress, alert and oriented x3.  HEENT:  Exam normal for age.  EOMs  intact.  Lungs:  Clear to auscultation.  No rhonchi or wheezing.  Cardiovascular:  Regular rhythm.  No murmurs.  Carotid pulses 3+, no  bruits.  Abdomen:  Soft, nontender with  no masses.  Musculoskeletal:  Free of major deformities.  Neurologic:  Normal.  Skin:  Free of rashes.  Lower extremity:  Varicosities in the right leg beginning at the knee,  extending into the pretibial region into the lateral ankle with no  significant edema.  She also has posterior calf varicosities on the  right up to the popliteal fossa.  Left leg has some varicosities in the  posterior calf which as well as the medial calf but not as severe as the  right side.  There is evidence of complete removal of both great  saphenous veins from previous heart surgery.   I visualized with the SonoSite the saphenofemoral junction, and there is  no great saphenous vein remaining in either leg, and the small saphenous  vein does not appear to have gross reflux.  I have recommended possible  stab phlebectomies or sclerotherapy for this if she would like to  proceed, and at present time she does not want to proceed.  We have not  done a formal duplex exam today, but she is not interested in further  treatment at this time.     Nelda Severe Kellie Simmering, M.D.  Electronically Signed   JDL/MEDQ  D:  10/03/2009  T:  10/04/2009  Job:  [id number]

## 2010-06-23 NOTE — Discharge Summary (Signed)
NAME:  Jodi [name], Jodi [name]                      ACCOUNT NO.:  [id number]   MEDICAL RECORD NO.:  [id number]                   PATIENT TYPE:  INP   LOCATION:  4729                                 FACILITY:  [facility]   PHYSICIAN:  Lorretta Harp, M.D.             DATE OF BIRTH:  10-28-1932   DATE OF ADMISSION:  08/17/2001  DATE OF DISCHARGE:  08/22/2001                                 DISCHARGE SUMMARY   DISCHARGE DIAGNOSES:  1. Bilateral pleural effusions.     A. Right thoracentesis August 18, 2001.     B. Left thoracentesis August 21, 2001.  2. Congestive heart failure.  Shortness of breath, resolved.  3. Coronary artery disease status post coronary bypass grafting with     complications post procedure.  Much improved.  4. Non-insulin-dependent diabetes mellitus.  5. Depression improved.  6. Probable sleep apnea.  7. Desaturation of ambulation.  8. Hypothyroidism.   DISCHARGE CONDITION:  Improved.   PROCEDURE:  1. Right thoracentesis on August 18, 2001 by [facility].  2. Left thoracentesis August 21, 2001 by [facility].   DISCHARGE MEDICATIONS:  1. Altace 10 mg 1 daily.  2. Norvasc 5 daily.  3. Enteric-coated aspirin 325 mg daily.  4. Coreg 3.125 one twice a day.  5. Glucophage 500 mg daily.  6. K-Dur 10 mEq 1/2 tablet daily.  Begin on "Sunday.  7. Synthroid 50 mg daily.  8. Aldactone 12.5 mg 1 daily.  9. Lasix 40 mg 1 twice a day.  10.      Ambien 5 mg as needed for sleep.  11.      Do not take Lopressor.  12.      Oxygen 2 liters nasal cannula as needed for walking and ambulation.   DISCHARGE INSTRUCTIONS:  1. Follow-up sleep apnea study as instructed.  2. Activity as tolerated.  3. Low fat, low salt, no more than 2000 mg of salt a day.  Diabetic diet.  4. Have blood work done on Wednesday, August 27, 2001.  Basic metabolic panel.  5. Follow up with Dr. Gamble's nurse practitioner, Beverly Baum, July 30,     20" 03 at 11:15 a.m.   HISTORY OF PRESENT  ILLNESS:  Patient presented August 17, 2001 with shortness  of breath, had been recently discharged from [facility] after complicated course  status post coronary artery bypass grafting.  She had original bypass  grafting in 1994 and in May 2003 she presented with a subendocardial MI.  It  was felt she needed redo bypass.   She had repeat with LIMA of the intermediate, saphenous vein graft to the  circumflex, saphenous vein graft to the PL, and saphenous vein graft to the  OM by Dr. Darylene Price.   Postoperatively patient had bleeding with reoperation.  LV patch was placed  and delayed closure of the sternum.  She had long rehab course with severe  depression, paroxysmal afib, hypothyroidism, and  respiratory difficulties at  times.  She was discharged July 21, 2001.   Prior to this admission her Lasix had been discontinued as she was stable  but progressively became more and more short of breath.  She came to the  emergency room on August 17, 2001 secondary to the shortness of breath,  despite having restarted the Lasix as an outpatient.   PAST MEDICAL HISTORY:  As stated.  Also non-insulin-dependent diabetes  mellitus which occurred during postoperative bypass and was seen by Dr.  Electa Sniff.  Hyperlipidemia, remote hysterectomy and hypothyroidism.   ADMISSION MEDS:  1. Altace 10 a day.  2. Norvasc 5 a day.  3. Glucophage 500 a day.  4. Aspirin daily.  5. Synthroid 0.025 daily.  6. Lopressor 25 b.i.d.   ALLERGIES:  Intolerant of Versed but no known allergies.   SOCIAL HISTORY, FAMILY HISTORY, REVIEW OF SYSTEMS:  See H&P.   PHYSICAL EXAM:  At discharge, blood pressure 142/88, pulse 84, respirations  20, temp 97.5.  Room air oxygen saturation 93%.  GENERAL: Alert, oriented, white female.  Pleasant affect, ready for  discharge.  HEART: S1, S2.  Regular rate and rhythm.  LUNGS: Decreased breath sounds.  ABDOMEN: Positive bowel sounds.  EXTREMITIES: No edema.   LABORATORY DATA:   Hemoglobin 12.2, hematocrit 37, WBC 10, and platelets 359.  Neutrophils 64, lymphs 23, mono 9, eos 2, basos 2.  Protime 14, INR of 1.1,  PTT 23.   Chemistry on admission: Sodium 139, potassium 2.8, chloride 100, CO2 29,  glucose 88, BUN 8, creatinine 0.9, calcium 9.0, total protein 7.6, albumin  2.9.  She was given potassium supplement prior to discharge.  Potassium was  5, sodium 135, BUN 16, creatinine 1.1, and glucose 98.   LFTs were normal.  BNP on admission was 1410 and prior to discharge it had  come down by over half.   TSH 7.248.  Urinalysis was clear.  Thoracentesis fluid from the 17th was  still pending at discharge.   July 14 chest x-ray: No pneumothorax after thoracenteses.  Decrease in right  pleural fluid.  The original chest x-ray was on the 13th: Increase in  effusion with associated increase in basilar atelectasis.  August 18, 2001  thoracentesis was completed, 1000 cc was removed.   July 16 chest x-ray: Slight enlargement in effusions and worsening of  basilar atelectasis.  July 18: Stable bilateral pleural effusions and  associated passive atelectasis in the lower lobes.  July 17 she had a left  thoracenteses.  No pneumo after procedure.   EKG on admission: Sinus rhythm with occasional PAC.  Cannot rule out  anterior infarction.  Right axis deviation was new.  Questionable lead  placement.   HOSPITAL COURSE:  Jodi [name] was admitted by Dr. Gwenlyn Found on July 13th for  shortness of breath, was found to have bilateral pleural effusions, started  on IV diuretics and underwent pleural effusion on the 14th.  She improved,  continued on diuretics, eventually changed to p.o. on the 15th.  Chest x-ray  on the 16th revealed pleural effusion.  Plans were made for thoracentesis on  the 17th of the left which were done; 370 cc of yellow fluid were obtained.  Labs were sent on those and results are not yet back.  By the 18th the patient was ambulating fairly well.  Occasionally her  sats  did drop down to 88% with ambulation.  She was discharged on home oxygen for  this reason.  She had  no other complaints and was quite ready for discharge.  Her BNP had come down by half.  Chest x-ray report from the 18the: Verbally  was told it was improved.   Patient was discharged by Dr. Wendi Snipes and will follow up with Dr. Melvern Banker.     Otilio Carpen. Richarda Overlie                     Lorretta Harp, M.D.    LRI/MEDQ  D:  09/15/2001  T:  09/19/2001  Job:  [id number]   cc:   Bryson Dames, M.D.   Valentina Gu. Roxy Manns, M.D.   Jeb Levering. Erin Hearing, M.D.

## 2010-06-23 NOTE — Discharge Summary (Signed)
NAME:  Jodi [name], Jodi [name] NO.:  [id number]   MEDICAL RECORD NO.:  [id number]                   PATIENT TYPE:  INP   LOCATION:  4707                                 FACILITY:  [facility]   PHYSICIAN:  Reginia Forts, M.D.                  DATE OF BIRTH:  1932-05-08   DATE OF ADMISSION:  09/17/2001  DATE OF DISCHARGE:  09/22/2001                                 DISCHARGE SUMMARY   CONSULTATIONS:  Dr. Melvern Banker of cardiology.   PROCEDURES:  None.   DISCHARGE DIAGNOSES:  1. Congestive heart failure exacerbation.  2. Pleural effusions bilaterally.  3. Systolic dysfunction.  4. Coronary artery disease status post coronary artery bypass grafting.  5. Hypothyroidism.  6. Hypoglycemia.  7. Hypercholesterolemia.  8. Candidal skin infection.  9. Anemia.   DISCHARGE MEDICATIONS:  1. Demadex 20 mg 1 tablet p.o. b.i.d.  2. Altace 10 mg 1 tablet p.o. b.i.d.  3. Coreg 6.25 mg 1 tablet p.o. b.i.d.  4. K-Dur 20 mEq 1 tablet daily.  5. Zocor 80 mg 1 tablet p.o. q.d.  6. Synthroid 50 mcg 1 tablet p.o. q.d.  7. Aldactone 12.5 mg 1 tablet p.o. q.d.  8. Aspirin 325 mg 1 tablet p.o. q.d.   FOLLOW UP:  1. The patient has a followup appointment with Dr. Erin Hearing at [facility] on Thursday, 09/25/2001, at 8:30 a.m.  2. The patient is to call Dr. Melvern Banker, cardiology, for followup appointment     in two weeks.   HOSPITAL COURSE:  The patient is a pleasant 77 year old Caucasian female  with a past medical history significant for CHF and systolic dysfunction,  coronary artery disease, and hypothyroidism, presenting secondary to a one-  day history of shortness of breath.  Chest x-ray on presentation revealed  CHF exacerbation as well as bilateral pleural effusions.  The patient was  admitted for aggressive diuresis during hospitalization.  Serial chest x-  rays were followed as well as daily weights and diuresis results.  Bilateral  pleural effusions  were determined to be small in nature and without  indication for thoracentesis.  The patient had a total net diuresis of  approximately 4 liters and a total 5-pound weight loss during  hospitalization with discharge weight of 138.3 pounds.  Twenty-four hours  prior to discharge, the patient was on room air and ambulating in the  hallway without difficulty.   Several manipulations of the patient's medication regime were made including  the following.  The patient was changed from Lasix to [facility] therapy during  hospitalization.  Altace and Coreg therapy were both increased during  hospitalization.   The patient was discharged on hospital day #5 in stable condition.   1. CONGESTIVE HEART FAILURE EXACERBATION:  The patient is status post     aggressive diuresis.  The patient was initiated on Demadex therapy upon     discharge.  She will follow up with her primary care physician in three     to four days to follow up electrolytes and renal function.  The patient     received extensive education by the dietician during hospitalization     about sodium restriction as well as water restriction.  The patient was     also counseled to weigh herself on a daily basis and to have a lower     threshold for calling physician with initiation of shortness of breath     during hospitalization.   1. PLEURAL EFFUSIONS:  Remained stable without progression during     hospitalization.  Of note, pleural effusions did decrease with aggressive     diuresis during hospitalization.  No thoracentesis was indicated.  Will     follow as an outpatient.   1. SYSTOLIC DYSFUNCTION:  Recent echocardiogram performed in May 2003     revealed an ejection fraction 35 to 45% .  The patient was continued on     Altace as well as Coreg and Aldactone therapy throughout hospitalization.   1. CORONARY ARTERY DISEASE:  The patient is status post CABG.  Will continue     patient on aspirin and Zocor therapy.   1.  HYPOTHYROIDISM:  Stable on Synthroid therapy.  TSH was within normal     limits during hospitalization.   1. HYPERGLYCEMIA:  Recent diagnosis of diabetes mellitus, type 2.     Glucophage was stopped during hospitalization, and the patient remained     normoglycemic.  The patient was instructed to stop Glucophage therapy     indefinitely.  Will follow up as an outpatient.  Recent hemoglobin A1C     5.8.   1. HYPERCHOLESTEROLEMIA:  The patient was restarted on Zocor 80 mg every     day.   1. ANEMIA:  Appreciated on previous hospitalization.  The patient was     hemoccult positive x 1; however, she has a history of hemorrhoids.  Will     defer to further workup as an outpatient.   1. CANDIDAL SKIN INFECTION:  The patient received Nystatin powder b.i.d. for     candidal infection below the breasts.   DISCHARGE INSTRUCTIONS:  Pain management is not applicable.   ACTIVITY:  No restrictions.   DIET:  Recommend low-salt, low-cholesterol diet.   WOUND CARE:  Not applicable.    SPECIAL INSTRUCTIONS:  It is recommended to patient to call Dr. Erin Hearing'  office at [phone] for development of increasing shortness of breath, chest  pain, or leg swelling.                                               Reginia Forts, M.D.    KS/MEDQ  D:  09/22/2001  T:  09/24/2001  Job:  [id number]   cc:   Bryson Dames, M.D.

## 2010-06-23 NOTE — Consult Note (Signed)
[place]  Patient:    Jodi [name], Jodi [name] Visit Number: [id number] MRN: [id number]          Service Type: ECR Location: [facility] 4526 01 Attending Physician:  Camelia Phenes Dictated by:   Elvia Collum, M.D. Proc. Date: 07/08/01 Admit Date:  07/09/2001   CC:         Braulio Conte H. Roxy Manns, M.D.  Bryson Dames, M.D.   Consultation Report  DATE OF BIRTH:  28-Sep-1932  REQUESTING PHYSICIAN:  Valentina Gu. Roxy Manns, M.D.  REASON FOR EVALUATION:  Possible ischemic brain injury.  HISTORY OF PRESENT ILLNESS:  This is the initial inpatient consultation evaluation of this 77 year old woman with a past medical history including multiple vascular risk factors and coronary artery disease.  She was admitted on May 30, 2001 after presenting with chest pain and ultimately underwent a redo CABG on Jun 05, 2001.  She had a protracted postoperative course with transient hypotension, acute renal failure, and coagulopathy resulting in tamponade requiring multiple further interventions.  However, over the past three to four weeks she has been gradually doing better.  She has had a pan to placed due to poor p.o. intake but there has been no documented dysphagia and she has been ambulating with assistance.  She has been noted to have a flat affect and sometimes be rather unmotivated.  Neurologic consultation is requested to rule out organic brain problem.  On specific questioning the patient denies any changes in her mental function that she has noted.  She does think maybe her left side is a little bit weaker than her right side. She admits to decreased mood and occasional hopeless feelings while in the hospital.  PAST MEDICAL HISTORY:  As above.  She also has history of hypertension and hyperlipidemia.  SOCIAL HISTORY:  She is widowed.  She does have supportive family including several children.  Prior to admission she was performing her  activities of daily living independently.  FAMILY HISTORY:  Remarkable for coronary artery disease in her mother and leukemia in her father as well as a brother with coronary artery disease.  MEDICATIONS:  Presently in the hospital she is receiving aspirin, Colace, Protonix, Dulcolax, Lopressor, Lovenox, hydralazine, Paxil 10 mg q.d., digoxin, vitamin B12, inhalers, sliding scale insulin for a new diagnosis of diabetes, Amaryl, amiodarone, Norvasc, Altace, Lasix, and tube feeds.  REVIEW OF SYSTEMS:  Decreased mood as above.  Denies ever having this prior to her hospitalization.  She also has had poor p.o. intake, but denies dysphagia. Otherwise, per HPI and admission H&P.  PHYSICAL EXAMINATION  VITAL SIGNS:  Temperature 97, blood pressure 145/75, pulse 78, respirations 20, O2 saturation 98% on room air.  GENERAL/MENTAL STATUS:  She is awake and alert.  She is completely oriented to place and time.  She is able to name the president, but not the previous president.  She has no defects to confrontational naming and is able to repeat a phrase.  She registers three of three memory objects and slowly, but correctly recalls all three of them after distraction.  She does have trouble with concentration tasks including spelling "world" backwards and has trouble subtracting three from 20.  Her affect is a little bit flat.  NEUROLOGIC:  Pupils are equal and briskly reactive.  Extraocular movements normal without nystagmus.  Visual fields full to confrontation.  Face, tongue, and palate move normally and symmetrically.  Motor:  Normal bulk and tone. Normal strength in all tested extremity  muscles.  Sensation is intact to pin prick and vibration in all extremities.  Reflexes are 2+ and symmetric.  There is a Babinski sign on the left.  Finger-to-nose is performed well.  Rapid alternating movements are slow, but symmetric.  Gait examination is deferred.  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  IMPRESSION:  Reactive depression secondary to a prolonged hospitalization. There appears to be no evidence of a brain injury significant enough to result in the above or to impair her recovery any great extent.  RECOMMENDATIONS: 1. Full effect of Paxil will not be noted for another two to three weeks.    Would suggest increasing the dose to 20 mg. 2. If safe from a cardiovascular standpoint, will consider Ritalin 2.5-5 mg    q.a.m. and q.noon to elevate noon. 3. Agree with psychiatric evaluation.  Short-term therapy is likely to be    helpful.  However, this should not hold up her transfer to the [facility].  Thank you for the consult. Dictated by:   Elvia Collum, M.D. Attending Physician:  Camelia Phenes DD:  07/08/01 TD:  07/09/01 Job: [id number]

## 2010-06-23 NOTE — Procedures (Signed)
[place]. [facility]  Patient:    Jodi [name], Jodi [name] Visit Number: [id number] MRN: [id number]          Service Type: MED Location: [id number] Attending Physician:  Darylene Price Dictated by:   Arnette Schaumann. Lutricia Feil, M.D. Proc. Date: 06/05/01 Admit Date:  05/30/2001   CC:         Braulio Conte H. Roxy Manns, M.D.   Procedure Report  PROCEDURE:  Intraoperative transesophageal echocardiographic evaluation.  HISTORY:  Ms. Madaris is a 77 year old white female with a diagnosis of severe multivessel coronary artery disease, status post myocardial infarction recent with aortic valvular disease, who is scheduled for a redo coronary artery bypass graft and possible approach to the aortic valve if deemed necessary.  PHYSICIAN:  Arnette Schaumann. Lutricia Feil M.D.  DESCRIPTION OF PROCEDURE:  After the successful induction of general anesthesia and stabilization of vital signs, the OmniPlane echocardiographic probe was passed transorally per esophagus into the patients stomach, the probe head turned cephalad, and the interrogation begun.  Beginning at the apex, multiple short axis transgastric sections were taken which demonstrated globally normal left ventricular contractility with moderate global left ventricular hypertrophy.  The intraventricular septum was intact.  The right ventricular cavities appeared somewhat dilated.  The probe head was carried further cephalad, and the mitral valve was interrogated.  The mitral valvular leaflets appeared to be moderately thickened.  There was calcification of the mitral valvular ring, and there was a trivial-to-mild central aortic regurgitant jet.  Probably on the basis of ischemic cardiomyopathy with valve ring dilation and abnormal papillary muscle function.  There was no reverse flow in the left upper pulmonary vein.  The intra-atrial septum was intact. The aortic valve was the interrogated, and all three valvular leaflets  were determined to be slightly thickened.  There was calcium on the right coronary leaflet.  There was reduced excursion primarily of the noncoronary leaflet with some fluttering of the right coronary leaflet.  Valve area was difficult to planimeter but was a minimum of 1 cm sq.  There was no regurgitation in the aortic valve.  The tricuspid valve demonstrated a moderate regurgitant jet, somewhat more than one would expect around the Swan-Ganz catheter.  Initially evaluation, therefore, was globally normal left ventricular function, with left ventricular global hypertrophy, mild mitral regurgitation probably ischemic in origin, mild aortic stenosis with a valve area at least 1 cm sq with thickening and calcification of the leaflets.  The probe was then put on standby, and the coronary artery bypass graft was begun.  After the surgery was accomplished, the probe head was reactivated, and it was deemed after filling and restarting that the inferior wall and posteroseptal wall were dyskinetic, and the left ventricular function rapidly deteriorated.  The patient was then returned to bypass.  After a further attempt at revascularization, and again failure to return from bypass, the intra-aortic balloon pump was passed through the left groin.  The placement of the balloon pump at just below the origin of the aortic arch was confirmed by echocardiography.  The pump functioned well, and separation from bypass was accomplished at that time.  The patient continued to do reasonably well with the exception of difficulty with bleeding which was controlled with multiple use of blood products and surface coagulants.  FINAL DIAGNOSIS:  Multivessel coronary artery disease with ischemic cardiomyopathy, mitral valve regurgitation mild, aortic stenosis mild-to-moderate with no aortic regurgitation, now status post coronary artery bypass graft with intra-aortic balloon pump assist device in place  and functioning.  The patient was now transported to the Intensive Care Unit. Dictated by:   Arnette Schaumann. Lutricia Feil, M.D. Attending Physician:  Darylene Price DD:  06/05/01 TD:  06/08/01 Job: [id number]

## 2010-06-23 NOTE — Cardiovascular Report (Signed)
. [facility]  Patient:    Jodi [name], Jodi [name]                   MRN: [id number] Proc. Date: 05/29/00 Adm. Date:  [id number] Attending:  Camelia Phenes CC:         Cardiac Catheterization Laboratory  [place] ______, [facility]   Cardiac Catheterization  PROCEDURES PERFORMED: 1. Intracoronary artery stenting of a saphenous vein graft to the intermediate    ramus branch of the left circumflex coronary artery to two sites:    a. Ostial.    b. Midbody. 2. Pre-dilatation of both sites in the vein graft with balloon angioplasty. 3. PercuSurge distal protection of the saphenous vein graft during    percutaneous coronary intervention.  COMPLICATIONS:  None.  ENTRY SITE:  Right femoral.  DYE USED:  Omnipaque.  PATIENT PROFILE:  The patient is a 77 year old woman, who underwent cardiac catheterization yesterday for unstable angina indications and a diseased saphenous vein graft.  Today, we planned coronary intervention with distal protection.  DESCRIPTION OF PROCEDURE:  This procedure was performed by a right percutaneous femoral approach using a 7 French introducer sheath and the guide catheter used was a 7 [place], left coronary bypass guiding catheter with side holes.  The guidewire used was the PercuSurge Guardwire.  The pre-dilatation balloon was a 2.0 x 15 mm Maverick balloon.  The two stents used were as follows:  Ostium 3.5 x 9 mm NIR Royal.  Midbody 3.5 x 18 mm NIR Royal.  Balloon occlusion time with the Guardwire was 13-1/2 minutes.  Material recovered during procedure was significant tan color particulate matter, approximately 8-10 pieces.  COMPLICATIONS:  There was a little bit of spasm in the distal vessel where the Guardwire tip rested.  This was improved with intracoronary nitroglycerin and also with 150 mcg of verapamil intracoronary by dilute infusion.  FINAL RESULTS:  Midbody graft was reduced from 99%  stenosis to 0% residual. Ostial stenosis was reduced from greater than 75% to 0% residual, both lesions after stenting were shown to have TIMI-3 class distal flow.  FINAL DIAGNOSIS:  Successful multi-site intervention of a saphenous vein graft with reduction of respective lesions as noted above to 0% residual with preservation of TIMI-3 class flow. DD:  05/29/00 TD:  05/30/00 Job: [id number]

## 2010-06-23 NOTE — Discharge Summary (Signed)
[place]. [facility]  Patient:    Jodi [name], Jodi [name] Visit Number: [id number] MRN: [id number]          Service Type: ECR Location: SACU 4526 01 Attending Physician:  Camelia Phenes Dictated by:   Erlene Quan, P.A.C. Admit Date:  07/09/2001 Discharge Date: 07/21/2001   CC:         [place] L. Erin Hearing, M.D.   Discharge Summary  Addendum for copies Dictated by:   Erlene Quan, P.A.C. Attending Physician:  Camelia Phenes DD:  07/21/01 TD:  07/22/01 Job: [id number]

## 2010-06-23 NOTE — Cardiovascular Report (Signed)
[place]. [facility]  Patient:    Jodi [name], Jodi [name] Visit Number: [id number] MRN: [id number]          Service Type: MED Location: [id number] Attending Physician:  Camelia Phenes Dictated by:   Octavia Heir, M.D. Proc. Date: 05/30/01 Admit Date:  05/30/2001   CC:         [place] A. Walker Kehr, M.D.  Bryson Dames, M.D.   Cardiac Catheterization  PROCEDURES: 1. Left heart catheterization. 2. Coronary angiography. 3. Left ventriculogram. 4. Saphenous vein graft. 5. Left internal mammary angiography. 6. Abdominal aortogram.  COMPLICATIONS: None.  INDICATIONS: The patient is a 77 year old female, patient of Dr. Janene Madeira and Dr. Walker Kehr, with a history of hypertension, history of CAD, status postcoronary artery bypass graft surgery in 1994 consisting of a LIMA to the LAD, a vein graft to RCA and vein graft to ramus intermedius. The patient had a repeat catheterization in April of 2002 revealing diffuse ostial disease of the vein graft to the ramus as well as mid graft lesion. She underwent PercuSurge placement with subsequent stenting of the mid and proximal vein graft to the ramus. She was noted at that time to have diffusely diseased proximal RCA graft. Over the last 48 hours, the patient has developed unstable angina and is now brought to the emergency room with nitroglycerin responsive angina. She is now referred for repeat catheterization.  DESCRIPTION OF PROCEDURE: After given informed written consent, the patient was brought to the cardiac catheterization lab where her right and left groins were shaved, prepped, and draped in the usual sterile fashion.  ECG monitoring was established.  Using modified Seldinger technique, a #6 French arterial sheath was inserted in the right femoral artery.  A 6 French diagnostic catheter was then used to perform diagnostic angiography.  This reveals a large left main with no significant  disease.  The LAD is totally occluded in its proximal segment after giving rise to a small diagonal branch. The mid and distal LAD is a medium to small vessel, which fills via patent LIMA to the LAD. There are two small diagonal branches with no significant disease. There are left to right collaterals to the distal PDA.  Left circumflex is a large vessel which courses in the AV groove and gave rise to two obtuse marginal branches as well as high OM or ramus intermedius. The AV groove circumflex has no significant disease. The ramus intermedius is seen to fill very faintly via left to left collaterals. The first OM is a medium sized vessel with proximal 90% lesion. The second OM is a medium sized vessel which bifurcates distally, has no significant disease.  There is a vein graft noted to a ramus branch, which was noted to have a stENT in its ostial and distal midportion. There is 80% stenosis throughout the proximal portion of the vein graft and then the graft is totally occluded in its mid segment prior to the other stents. There is dye hanging in the graft after injection.  The right coronary artery is a small vessel which is totally occluded in its mid segment and is diffusely diseased up to 90% in its proximal segment. Again, the distal RCA as well as the PDA are filled via left to right collaterals from the circumflex and LAD. The vein graft to the RCA is totally occluded in its proximal segment.  LEFT VENTRICULOGRAM: Left ventriculogram reveals a severely depressed EF at 20-25% with global hypokinesis. There is posterior  basal hypokinesis noted.  ABDOMINAL AORTOGRAM: Abdominal aortogram reveals distal aortic tapering.  HEMODYNAMICS: Systemic arterial pressure 182/99, LV systemic pressure 184/12, LVEDP of 18.  CONCLUSIONS: 1. Severe three-vessel coronary artery disease. 2. Patent left internal mammary artery to the left anterior descending. 3. Totally occluded vein graft to  the ramus intermedius. 4. Totally occluded vein graft to the right coronary artery. 5. Severely depressed left ventricular systolic function with wall motion    abnormalities as noted above. 6. Mild distal aortic tapering. 7. Systemic hypertension. Dictated by:   Octavia Heir, M.D. Attending Physician:  Camelia Phenes DD:  05/30/01 TD:  05/31/01 Job: [id number]

## 2010-06-23 NOTE — H&P (Signed)
[place]. [facility]  Patient:    Jodi [name], Jodi [name] Visit Number: [id number] MRN: [id number]          Service Type: ECR Location: SACU 4526 01 Attending Physician:  Camelia Phenes Dictated by:   Erlene Quan, P.A.-C. Admit Date:  07/09/2001 Discharge Date: 07/21/2001                           History and Physical  HISTORY OF PRESENT ILLNESS:  The patient is a 77 year old female who is admitted to the [facility] for rehab and conditioning after a complicated bypass surgery.  She had a complicated course after the redo bypass grafting on Jun 05, 2001.  Please see the recent discharge summary for complete details. The patient is admitted now to the [facility] for further treatment.  She has been debilitated since her surgery, and has been slow to recovery, with complications including PAF, hypothyroidism, and depression.  CURRENT MEDICATIONS AT TRANSFER  1. Aspirin q.d.  2. Colace 100 mg b.i.d.  3. Lopressor 25 mg b.i.d.  4. Lovenox 30 mg q.d.  5. Apresoline 25 mg t.i.d.  6. Paxil 10 mg q.d.  7. Lanoxin 0.0625 mg q.d.  8. Foltx q.d.  9. Albuterol 2.5 mg nebulizer q.6h. 10. Atrovent 0.5 mg q.6h. nebulizer. 11. Amaryl 2 mg q.d. 12. Amiodarone 200 mg q.d. 13. Norvasc 10 mg q.d. 14. Altace 5 mg q.d. 15. Lasix 40 mg q.d. 16. Potassium 20 mEq q.d.  ALLERGIES:  She is intolerant of VERSED but has no other drug allergies.  SOCIAL HISTORY/FAMILY HISTORY/REVIEW OF SYSTEMS:  Please see the recent history and physical dictated on May 30, 2001.  PHYSICAL EXAMINATION  VITAL SIGNS:  Blood pressure 137/75, heart rate 68, temperature 97.6 degrees, respirations 18.  GENERAL:  She is a well-developed pale weak female, in no acute distress.  HEENT:  Normocephalic.  Extraocular movements intact.  Sclerae anicteric.  NECK:  Without jugular venous distention.  She does have a left carotid bruit.  CHEST:  Reveals diminished breath sounds  bilaterally.  CARDIAC:  Reveals a regular rate and rhythm.  Diminished heart sounds.  ABDOMEN:  Nontender, nondistended.  EXTREMITIES:  Reveal trace edema.  Healing venectomy sites.  NEUROLOGIC:  Grossly intact.  She is awake and cooperative.  IMPRESSION 1. Debilitation, status post complicated redo bypass surgery. 2. Coronary artery bypass grafting in 1994, with redo surgery on    Jun 05, 2001. 3. Paroxysmal atrial fibrillation postoperatively. 4. Depression postoperatively. 5. Transient renal insufficiency. 6. Treated hypertension. 7. Hyperlipidemia. 8. Left ventricular dysfunction, ejection fraction of 40%-45% on    Jun 27, 2001.  PLAN:  The patient is admitted to the [facility] for further treatment. Dictated by:   Erlene Quan, P.A.-C. Attending Physician:  Camelia Phenes DD:  07/21/01 TD:  07/22/01 Job: [id number]

## 2010-06-23 NOTE — Cardiovascular Report (Signed)
[place]. [facility]  Patient:    Jodi [name], Jodi [name]                  MRN: [id number] Proc. Date: 05/28/00 Attending:  Bryson Dames, M.D. CC:         Cardiac Catheterization Lab                        Cardiac Catheterization  PROCEDURES PERFORMED: 1. Selective coronary angiography of the native coronary circulation. 2. Retrograde left heart catheterization. 3. Left ventricular angiography. 4. Selective visualization of the left internal mammary artery graft. 5. Selective visualization of multiple saphenous vein grafts.  CARDIOLOGIST:  Bryson Dames, M.D.  COMPLICATIONS:  None.  ENTRY SITE:  Right femoral.  DYE USED:  Omnipaque.  MEDICATIONS GIVEN:  Versed for sedation and labetalol for elevated blood pressure.  PATIENT PROFILE:  The patient is a 77 year old woman who underwent coronary artery bypass grafting in 1994 with a left internal mammary graft to her LAD, a vein graft to the posterior descending and posterolateral branches of the right coronary artery, and a vein graft to the intermediate ramus branch of the circumflex.  She has had chest pain and bilateral arm numbness and underwent a Persantine Cardiolite stress test May 22, 2000.  There was ischemia to the anterolateral and apical wall segments, and the patient was scheduled for cardiac catheterization today.  RESULTS:  The left main coronary artery is normal in appearance.  The left anterior descending coronary artery is 100% occluded just distal to the second septal perforator branch.  The distal vessel fills by way of collaterals from an intact internal mammary artery graft.  The circumflex coronary artery contains a 95% stenosis in the proximal portion of an intermediate ramus branch.  The distal circumflex provides collateral flow to the distal right coronary artery, and there is bidirectional flow.  The right coronary artery is severely diseased diffusely and is  100% occluded in the mid portion of the vessel.  The vein graft to the right coronary artery contains a 90% stenosis proximally followed by another area of 80% stenosis. The mid vessel is 70% stenosed, and there also appears to be some irregularities in the mid posterior descending.  the saphenous vein graft to the intermediate ramus branch contains a 90% stenosis in the mid portion of the vessel that is eccentric, and distal runoff is somewhat poor.  FINAL DIAGNOSES: 1. Multivessel native coronary artery disease. 2. Patent internal mammary artery graft to the left anterior descending    artery. 3. A 99% eccentric stenosis of the mid portion of the saphenous vein graft    to the intermediate ramus branch. 4. Multiple lesions in the saphenous vein graft to the right coronary artery.  PLAN:  The patient will undergo percutaneous intervention tomorrow, May 29, 2000, and will most likely undergo AngioJet atherectomy.DD:  05/28/00 TD:  05/29/00 Job: [id number]

## 2010-06-23 NOTE — Op Note (Signed)
NAMEDAINTY, LUTTRULL            ACCOUNT NO.:  [id number]   MEDICAL RECORD NO.:  [id number]          PATIENT TYPE:  AMB   LOCATION:  SDS                          FACILITY:  [facility]   PHYSICIAN:  Jefry H. Constance Holster, MD     DATE OF BIRTH:  1932-05-28   DATE OF PROCEDURE:  02/27/2006  DATE OF DISCHARGE:                               OPERATIVE REPORT   PREOPERATIVE DIAGNOSIS:  Right tympanic membrane perforation.   POSTOPERATIVE DIAGNOSIS:  Right tympanic membrane perforation.   PROCEDURE:  Right medial graft tympanoplasty.   SURGEON:  Jefry H. Constance Holster, MD   ANESTHESIA:  General endotracheal anesthesia was used.   COMPLICATIONS:  No complications.   BLOOD LOSS:  Minimal.   FINDINGS:  30% perforation of the posterior half of the tympanic  membrane.  There was adhesion of the perforation edges to the  promontory.  There was very thick mucoid material contained within the  epitympanum and the middle ear as well as the eustachian tube orifice.  The ossicular chain was intact.  There seemed to be some erosion of the  lenticular process of the incus but there was still good continuity and  good free mobility.  The chorda tympani nerve was identified and  preserved.  There was no epithelium ingrowth into the middle ear or  mastoid.  There was no granulation tissue.  The umbo of the malleus was  adhesed down to the promontory as well.  No complications.  The patient  tolerated well.   HISTORY:  77 year old with history of bilateral tympanic membrane  perforations, left side was repaired several months ago with good  results.  The right side continues to drain and is chronically infected.  Risks, benefits, alternatives, complications of procedure were explained  to the patient's, who seemed to understand and agreed to surgery.   PROCEDURE:  The patient was taken to the operating room and placed on  the operating table in supine position.  Following induction of general  endotracheal  anesthesia, the right ear was prepped and draped in  standard fashion.  Xylocaine with epinephrine was infiltrated into four  quadrants of the external auditory canals as well as the postauricular  sulcus and the temporalis area.  The vascular strip was created with a  sickle knife by making radial cuts in the ear canal at 4 o'clock and 8  o'clock.  A round knife was used to connect the incisions lateral to the  annulus and to continue these beyond the radial incisions.  Vascular  strip was dissected off the ear canal bone.  Postauricular incision was  then created using electrocautery when dissection down into the mastoid  periosteum was accomplished.  A graft was harvested from loose aeriolar  tissue superficial to the temporalis fascia and pressed and dried on the  back table.  The linea temporalis and mastoid periosteum were incised  using 10 scalpel and the ear was brought forward off of the mastoid bone  taking a vascular strip as well.  Perkins retractor was used to the ear  in place.  The edges of the perforation was then freshened  using a sharp  otologic pick.  The adhesions were taken down between the drum and the  malleus and the promontory.  A large amount of thick mucoid secretions  were aspirated from the epitympanum in the middle ear and the eustachian  tube area.  The ossicular chain was inspected.  The middle ear was  packed with saline-soaked Gelfoam pieces.  The graft was cut to size and  shape then notched for the malleus and was then inserted medial to the  malleus and properly positioned and supported using additional saline-  soaked Gelfoam.  The flap was brought back to its native position and  the edges of the perforation were rested up along the graft in an  adequate fashion.  The ear canal was packed with Ciprodex soaked  Gelfoam.  The vascular strip was placed down along the ear canal bone,  additional packing was used at the meatus.  Postauricular incision was   reapproximated using subcuticular sutures of 4-0 chromic and the  periosteum was reapproximated as well.  Dermabond was applied to the  skin.  A Glasscock dressing was applied.  A bacitracin cotton ball was  placed at the meatus.  The patient was then awakened, extubated,  transferred to recovery in stable condition.      Jefry H. Constance Holster, MD  Electronically Signed     JHR/MEDQ  D:  02/27/2006  T:  02/27/2006  Job:  [id number]

## 2010-06-23 NOTE — Op Note (Signed)
[place]. [facility]  Patient:    Jodi [name], Jodi [name] Visit Number: [id number] MRN: [id number]          Service Type: MED Location: [id number] Attending Physician:  Darylene Price Dictated by:   Valentina Gu. Roxy Manns, M.D. Proc. Date: 06/10/01 Admit Date:  05/30/2001   CC:         CVTS office   Operative Report  PREOPERATIVE DIAGNOSIS:  Status post redo coronary artery bypass grafting and status post temporary Esmark sternal closure.  POSTOPERATIVE DIAGNOSIS:  Status post redo coronary artery bypass grafting and status post temporary Esmark sternal closure.  PROCEDURE:  Mediastinal irrigation and delayed primary closure.  SURGEON:  Valentina Gu. Roxy Manns, M.D.  ASSISTANT:  Ms. Stacy Gardner, RNFA.  ANESTHESIA:  General.  BRIEF CLINICAL NOTE:  The patient is a 77 year old white female who underwent redo coronary artery bypass grafting x4 on Jun 05, 2001.  This procedures postoperative course was notable for postoperative bleeding requiring re-exploration as well as subsequent development of pericardial tamponade requiring sternal reopening and temporary coverage with Esmark closure.  The patient has improved over the ensuing 72 hours and is brought to the operating room for mediastinal irrigation and possible delayed primary closure.  DESCRIPTION OF PROCEDURE:  The patient was brought to the operating room on the above mentioned date and placed in the supine position on the operating table.  Adequate general endotracheal anesthesia is verified and monitored under the care and direction of Dr. Sherren Kerns.  Initially, attempts are made by Dr. Tamala Julian to place a new Swan-Ganz catheter through both the right and left internal jugular approach.  The attempts were unsuccessful.  Subsequently, a new triple lumen central venous catheter is placed in the right subclavian position by myself using the Seldinger technique under sterile conditions.  This was  technically straight forward and uncomplicated.  The patients existing left subclavian Cordis sheath and Swan-Ganz catheter are left in placed for hemodynamic monitoring purposes. Intravenous antibiotics are administered.  The patients anterior chest was cleaned with Betadine scrub and painted with Betadine solution.  The existing chest tubes are subsequently removed and the Esmark dressing is removed.  The anterior chest is again painted with Betadine solution and draped in a sterile manner.  The anterior mediastinum is re-explored.  Old blood clots and fibrinous debris are gently debrided and removed.  The mediastinum was subsequently irrigated using a pulse lavage irrigation device with 3 L of warm saline solution.  Following this, the mediastinum, and both left and right pleural spaces are again irrigated with pulse lavage by using multiple antibiotic solution.  No abnormalities are encountered and there is no sign of ongoing bleeding.  New chest tubes are replaced through the previous stab incisions inferiorly to drain the mediastinum and both left and right pleural spaces.  The sternum is reclosed using sternal wires in the routine fashion.  The patients hemodynamic status is carefully monitored on sternal reclosure.  The initial cardiac output following sternal closure are greater than 6 L/min.  The patient remained stable throughout the remainder of the operation.  The skin edges are subsequently debrided back to clean healthy, well-vascularized tissue.  The median sternotomy is closed with a running layer of PDS to close the deep fascia.  Because of relative fragility of the skin as well as possible mild tension, the subcutaneous tissues and skin edges are closed in a single layer with interrupted 3-0 Prolene vertical mattress sutures.  The chest tubes are affixed  to a closed suction drainage device and dry sterile dressings are applied.  The patient tolerated the procedure  well and is transported back to the surgical intensive care unit in stable condition.  There are no intraoperative complications.  All sponge, instrument, and needle counts were verified correct at completion of the operation.  The patient was transfused one unit of packed red blood cells intraoperatively. Dictated by:   Valentina Gu Roxy Manns, M.D. Attending Physician:  Darylene Price DD:  06/10/01 TD:  06/12/01 Job: [id number]

## 2010-06-23 NOTE — H&P (Signed)
[place]. [facility]  Patient:    Jodi [name], Jodi [name] Visit Number: [id number] MRN: [id number]          Service Type: MED Location: [id number] Attending Physician:  Tyrone Apple Dictated by:   Erlene Quan, P.A.C. Admit Date:  05/30/2001                           History and Physical  CHIEF COMPLAINT:  Chest pain.  HISTORY OF PRESENT ILLNESS:  The patient is a 77 year old female followed by Dr. Patrick Jupiter A. Walker Kehr and the teaching service with a history of coronary disease. She had a bypass x3 by Dr. Gaye Pollack in September of 1994; she had an SVG to the RCA, SVG to the intermediate and an LIMA to the LAD.  In April of 2002, she had abnormal Cardiolite study and catheterization and subsequent stenting to the SVG to the intermediate.  At that time, the LIMA to the LAD was patent.  The SVG to the RCA was diffusely disease but there were left-to-right collaterals.  Her EF was 50%.  She had an office visit in December of 2002 but this was cancelled secondary to inclement weather.  She has not seen [place] since.  She has had some increasing belching and vague indigestion symptoms for the last two weeks.  She saw her primary care doctor last week, who encouraged her to contact Dr. Bryson Dames, but the patient had put this off thinking it was indigestion.  Prevacid had been added but she has not had relief with this.  She woke up this morning at 2 a.m. with substernal chest pain and pressure with radiation to her teeth.  She denies any nausea, vomiting or diaphoresis.  She took three nitroglycerins at home without relief.  She called EMS and received relief of her symptoms with nitroglycerin and oxygen.  She is currently pain-free now in the emergency room.  PAST MEDICAL HISTORY:  Her past medical history is remarkable for hypertension and hyperlipidemia.  She has had a hysterectomy, in 1991.  There is no history of diabetes.  CURRENT  MEDICATIONS: 1. Zocor 80 mg a day. 2. Aspirin q.d. 3. Toprol-XL 25 mg a day. 4. Cozaar 100 mg a day. 5. Procardia XL 90 mg a day. 6. Hydrochlorothiazide 25 mg a day.  ALLERGIES:  She has no known drug allergies.   SOCIAL HISTORY:  She is a widowed and has 7 children, 15 grandchildren and 7 great-grandchildren.  She has never smoked.  FAMILY HISTORY:  Family history is remarkable in that her mother had bypass in her 54s, her father died in his 23s of leukemia.  She has one brother who had coronary disease and bypass in his 42s.  REVIEW OF SYSTEMS:  She admits to increasing dyspnea on exertion over the last two weeks.  There is no history of GI bleeding or peptic ulcer disease.  There is no history of thyroid disease.  There is no history of claudication.  There is no history of kidney stones.  She has not had fever or temperature.  She has had a recent cough.  PHYSICAL EXAMINATION:  VITAL SIGNS:  Pulse 86, respirations 16, 138/82.  GENERAL:  She is a well-developed, well-nourished female in no acute distress.  HEENT:  Normocephalic.  Extraocular movements are intact.  Sclerae are nonicteric.  Lids and conjunctivae are within normal limits.  NECK:  Without JVD.  She  does have a left carotid bruit versus transmitted murmur.  CHEST:  Clear to auscultation and percussion.  CARDIAC:  Exam reveals a 3/6 systolic murmur from the aortic valve area to the axilla, normal S1 and positive S4.  ABDOMEN:  Nontender.  No hepatosplenomegaly.  No bruits.  EXTREMITIES:  Without edema.  There are no femoral artery bruits.  Distal pulses are 2+/4.  NEUROLOGIC:  Exam is grossly intact.  She is awake, alert, oriented and cooperative, moves all extremities without obvious deficit.  LABORATORY AND ACCESSORY DATA:  Her EKG shows sinus rhythm, PACs, small inferior Q waves.  Lab:  White count 9.7, hemoglobin 13, hematocrit 37.4, platelets 277,000. Renal function showed sodium of 138,  potassium of 3.0, BUN 15, creatinine 0.9. CK 96, MB 4, troponin at 0.52, INR 1.0.  Chest x-ray shows mild cardiac enlargement.  IMPRESSION: 1. Unstable angina and possible subacute endocardial myocardial infarction. 2. Coronary disease with coronary artery bypass grafting in 1994, with stent    to the saphenous vein graft to ramus intermedius, April of 2002. 3. Treated hypertension. 4. Treated hyperlipidemia. 5. New systolic murmur. 6. Left carotid artery bruit versus transmitted murmur.  PLAN:  The patient is to be treated with IV heparin and nitroglycerin.  She will need catheterization.  We will check a 2-D echocardiogram today.  She will need followup of her carotid bruit; this could be done as an outpatient. The patient was seen by Dr. Roque Lias and myself in the emergency room. Dictated by:   Erlene Quan, P.A.C. Attending Physician:  Tyrone Apple DD:  05/30/01 TD:  05/30/01 Job: [id number]

## 2010-06-23 NOTE — Discharge Summary (Signed)
[place]. [facility]  Patient:    Jodi [name], Jodi [name] Visit Number: [id number] MRN: [id number]          Service Type: ECR Location: SACU [id number] 01 Attending Physician:  Camelia Phenes Dictated by:   Earnstine Regal, P.A.-C. Admit Date:  07/09/2001 Disc. Date: 07/10/01   CC:         Butler A. Walker Kehr, M.D.  Bryson Dames, M.D.  Drue Stager. Williford, M.D.  Alla German, M.D.   Discharge Summary  DATE OF BIRTH:  April 28, 1932  ADMITTING DIAGNOSES: 1. Unstable angina/status post subendocardial myocardial infarction, status    post coronary artery bypass grafting in 1994, status post PTCA and stent    placement April 2002. 2. Hypertension. 3. Hyperlipidemia. 4. New systolic murmur and left carotid bruit.  DISCHARGE DIAGNOSES:  1. Recurrent unstable angina, status post subendocardial myocardial     infarction, status post coronary artery bypass grafting in 1994, status     post two stents to the left anterior descending graft, April 2002, with     class IV unstable angina.  2. Postoperative bleeding secondary to coagulopathy and friable left     ventricular wall after subendocardial myocardial infarction.  3. Tamponade.  4. Postoperative congestive heart failure/adult respiratory distress     syndrome.  5. Postoperative ileus.  6. Nonoliguric acute renal failure - resolved.  7. Postoperative anemia and thrombocytopenia with negative heparin-induced     thrombocytopenia, Jun 10, 2001.  8. Postoperative atrial fibrillation.  9. Postoperative volume overload. 10. Cholelithiasis with thickened gallbladder wall and sludge. 11. Depression. 12. Malnutrition debilitation.  PROCEDURES: 1. Cardiac catheterization, May 30, 2001, Dr. Tami Ribas. 2. Re-do coronary artery bypass grafting with free right internal mammary to    the ramus intermediate; saphenous vein graft to the circumflex; saphenous    vein graft to the posterolateral; and  saphenous vein graft to the OM with    intraoperative transesophageal echocardiogram, Jun 05, 2001, Dr. Roxy Manns. 3. Reexploration for bleeding with bypass bovine pericardial patch to the LV    wall, anterior aortic balloon placement 2 a.m., Jun 06, 2001. 4. Reexploration for tamponade lavage and temporary Esmarch closure,    Jun 06, 2001, at 1450 p.m., Dr. Roxy Manns. 5. Removal of anterior balloon pump, Jun 09, 2001. 6. Mediastinal irrigation and closure of sternum, Jun 10, 2001. 7. Extubation, Jun 11, 2001.  HOSPITAL COURSE:  The patient is a 77 year old white female, medical patient of Dr. Gwyndolyn Saxon Gambles, who was admitted on May 30, 2001, with recurrent chest pain and unstable angina.  CPKs were positive, and she was shown to have subendocardial myocardial infarction.  The patient has an extensive past medical history, including coronary artery bypass grafting in 1994 by Dr. Cyndia Bent.  History of hypertension, history of hyperlipidemia.  She has also recently undergone two stent placements in her LAD graft back in February of this year.  After stabilization, she underwent cardiac catheterization by Dr. Tami Ribas.  This showed the left internal mammary to the LAD was okay.  The ramus intermedius was occluded.  The RCA graft was occluded.  Ejection fraction was judged at 20-25%.  It was their opinion, the patient undergo coronary artery bypass grafting.  The patient was seen in consultation by Dr. Arvid Right.  He agreed that the saphenous vein grafts were occluded, and there was severe three-vessel coronary artery disease.  The only patent graft was the LIMA to the LAD, and there was a high-grade lesion to the bifurcation  of the LAD diagonal.  It was his opinion that this was best treated with re-do CABG.  It was his opinion that the right internal mammary artery and a radial graft were possible.  This was discussed with the patient, and she was agreeable to surgery.  Dr. Cyndia Bent was scheduled to go  out of town, and surgery was scheduled for Jun 09, 2001.  During that interim, she developed recurrent chest pain.  She was seen in consultation by Dr. Darylene Price in Dr. Earlene Plater absence.  It was his opinion the patient should undergo surgery prior to the currently scheduled time.  She had a low-grade fever.  He started her on antibiotics, and she was scheduled for surgery the next available spot which was Jun 05, 2001.  She remained afebrile after starting antibiotics, remained stable on heparin and IV nitroglycerin.  Preoperative Doppler studies showed an index of .97 on the right and 1 on the left.  The carotids showed no ICA stenosis bilaterally.  Upper extremity Dopplers were normal.  She started having recurrent chest pain.  Her IV nitroglycerin was increased.  she was maintained on heparin and taken to the operating room on Jun 05, 2001, at which time, she underwent re-do coronary artery bypass grafting as described above. This was done using cardiopulmonary bypass, potassium cardioplegia, and ______  myocardial hypothermia.  Initial attempts to wean her off were unsuccessful, but she was eventually weaned off cardiopulmonary bypass.  She had a severe coagulopathy upon the end of her procedure.  She was transferred to the ICU but continued to have significant blood loss and was taken back in the early a.m. of Jun 06, 2001, to the OR for reexploration.  She was placed on cardiopulmonary bypass, and a patch repair of the bleeding lateral wall of the LV at the site of her recent MI was performed.  She was placed on anterior aortic balloon pump and transferred back to the ICU.  The following a.m. on at 0750, cardiac index was in the 1.5 range with ______ drips.  She continued to bleed, although it slowed down with a cardiac index down.  It was  Dr. Ricard Dillon that she needed to be reopened again for possible tamponade.  She was taken back to the OR on Jun 06, 2001, and underwent  mediastinal reexploration, lavage, and temporary Esmarch closure.  She had a marked improvement in her blood pressure and decrease in her PA pressures with this, and she had a modest amount of blood accumulation.  Upon transfer to the ICU, she was stable.  Her INR was 2.  Her PTT was elevated.  This was treated with FFP and platelets.  She was also transfused during this time for her postoperative anemia.  She also had a thrombocytopenia, and HIT was negative. She made slow, steady progress over the next several days.  She was maintained on the ventilator.  Her LFTs were elevated.  This was thought to be due to hepatic congestion.  She remained critically ill during this time with congestive heart failure, RADS, elevated LFTs, and also a postoperative acute renal failure with creatinine elevation up to 3.4.  She was medically managed and continued to make slow, steady progress.  She developed postoperative atrial fibrillation and was also treated for this.  Her urinary output slowly improved, and she started making slow, steady progress.  She developed a postoperative ileus and was started on TNA.  When her ileus resolved, TNA was discontinued, and she was eventually  started on tube feedings.  She had a closure of her mediastinum and mediastinal irrigation on Jun 10, 2001.  The patient has continued to make slow, steady progress from then until the current time.  She was eventually transferred to the step-down unit.  Because of her poor p.o. intake, a feeding tube was placed on Jun 26, 2001, and she was placed on evening feedings.  She had been started on occupational therapy and physical therapy.  She has made slow progress.  She has been lethargic and weak.  Her atrial fibrillation converted to a sinus rhythm.  She has had episode of junk general bradycardia but, with adjustments in her medications, this is also improved.  She continued to make slow, steady progress.  She was slow to  ambulate, very tired, and lethargic.  She is malnourished and markedly deconditioned.  In addition to this, she has had an extremely flat affect.  It has been very difficult to communicate with her and make her do the physical therapy and the occupational therapy necessary.  A CT scan obtained showed no acute infarcts, although she did have some atrophy and small vessel disease with no acute changes.  She was seen in consultation by Dr. Rhona Raider of the psychiatric service.  It was his impression she had a major depression which was severe, multiple medical problems.  He recommended Paxil at 20 mg q.d. x 3 weeks and low-dose Ritalin between 2.5 and 5 mg in the a.m. at noon hour if cleared by cardiology and then pastoral visits.  Because of her debilitation and malnutrition, a SACU bed had been requested and, by July 08, 2001, a SACU bed was ready.  It was Dr. Ricard Dillon opinion that the patient was medically stable and ready for SACU.  CBGs during the day on July 08, 2001, showed glucoses of 118, 166, 96, and 117.  The patient is on tube feedings from 8 p.m. to 6 a.m.  The a.m. glucose is 159.  Her sternum is stable.  The wounds are all healing nicely.  She has bilateral basilar rales.  Her leg incision was healing well.  She was started on Ritalin at 2.5 mg b.i.d. 0700 and at noon.  She was also increased to Paxil at 20 mg q.d.  TSH on July 07, 2001, was 10.407.  Free T4 was 1.19 which is normal.  Prealbumin is up to 22.  Her creatinine, which peaked at 3.4, is down to 0.9.  Hemoglobin is 9.9 with hematocrit of 29.8.  White count is 7.8.  Platelet count of 223,000.  Her condition at this time is improving.  It is Dr. Ricard Dillon opinion she is ready for SACU; the bed is available, and she is subsequently transferred there.  She will placed on Dr. Golden Hurter service, and they will follow her daily. Dr. Roxy Manns will continue to follow her in the SACU, and we will arrange for follow-up in our office closer to  the time she is ready for discharge.  DISCHARGE MEDICATIONS:  1. Albuterol 2.5 mg and Atrovent 0.5 mg q.i.d. in hand-held nebulizer.  2. Cordarone 200 mg p.o. q.d.  3. Norvasc 10 mg q.d.  4. Aspirin 81 mg q.d.  5. Digoxin 0.625 mg q.d.  6. Lovenox 30 mg p.o. q.d.  7. Lasix 40 mg p.o. q.d.  8. Amaryl 2 mg p.o. q.d.  9. Ritalin 2.5 mg 0700 and 1100 q.d. 10. Lopressor 25 mg p.o. b.i.d. 11. Protonix 40 mg q.d. 12. Paxil 20 mg q.d. 13.  Kay Ciel 20 mEq q.d. 14. Altace 5.0 mg q.d. 15. Nu-Iron 1 q.d.  CONDITION ON DISCHARGE:  Improved. Dictated by:   Earnstine Regal, P.A.-C. Attending Physician:  Camelia Phenes DD:  07/09/01 TD:  07/09/01 Job: [id number]

## 2010-06-23 NOTE — Op Note (Signed)
[place]  Patient:    Jodi [name], Jodi [name] Visit Number: [id number] MRN: [id number]          Service Type: MED Location: [id number] Attending Physician:  Darylene Price Dictated by:   Valentina Gu. Roxy Manns, M.D. Proc. Date: 06/05/01 Admit Date:  05/30/2001   CC:         Bryson Dames, M.D.  CVTS office   Operative Report  PREOPERATIVE DIAGNOSIS:  Bleeding, status post redo coronary artery bypass grafting x4.  POSTOPERATIVE DIAGNOSIS:  Bleeding, status post redo coronary artery bypass grafting x4.  PROCEDURE:  Re-exploration for bleeding with establishment of total cardiopulmonary bypass and bovine pericardial patch repair of left ventricular wall.  SURGEON:  Valentina Gu. Roxy Manns, M.D.  FIRST ASSISTANT:  Margaret Pyle.  SECOND ASSISTANT:  Miki Kins, R.N.S.A.  ANESTHESIA:  General.  BRIEF CLINICAL NOTE:  The patient is a 77 year old female who underwent redo coronary artery bypass grafting x4 on 06/05/01.  The details of this procedure have been dictated previously (dictation [id number]).  Upon return to the Surgical Intensive Care Unit, following the patients initial procedure, the patient was noted to have increased chest tube output measuring 500 cc upon arrival in the intensive care unit.  Within the next 30 minutes, another 400 cc of blood had come out.  The patient remained hemodynamically stable.  The patient was noted to be severely coagulopathic.  However, because of the unusually high amount of chest tube output, it is felt that direct return to the operating room represents the clear best choice for further management and therapy.  The patients family is appraised of the situation, and the patient is promptly returned directly to the operating room.  OPERATIVE NOTE IN DETAIL:  The patient is placed on the operating room table in the supine position.  Adequate general anesthesia is verified by the anesthesia staff under the care  and direction of [employer]. Lutricia Feil, M.D. The patient is continuously monitored using pulmonary artery catheter, arterial blood pressure from the intra-aortic balloon pump, frequent serial blood samples for blood gas as well as other chemistries and other lab work, pulse oximetry.  The patients existing surgical dressings are removed.  The existing chest tubes are removed, and the anterior chest, abdomen, groins, and right lower extremity are prepared and draped in a sterile manner.  The median sternotomy incision is reopened.  Upon entry into the chest, there remains a significant amount of blood.  There is no clot appreciated.  All remaining blood is evacuated.  Careful search for sources of bleeding is performed.  There appears to be a significant amount of ongoing hemorrhage from the lateral wall of the left ventricle in the vicinity of the patients recent myocardial infarction, and three of the four bypass grafts placed at the time of surgery.  Brief attempts at examination of the sites of bleeding is performed.  However, because of the location, it is impossible to safely address these areas without replacing the patient on cardiopulmonary bypass.  Heparin is administered intravenously.  The ascending aorta and the right atrium are cannulated for cardiopulmonary bypass.  Adequate heparinization is verified.  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  However, the patients baseline activated clotting time was quite elevated prior to heparin administration during the second procedure.  Following an initial estimated dose  of heparin, the patients activated clotting time subsequently rose to greater than 1500 seconds.  Cardiopulmonary bypass is begun, and the surface of the heart is  inspected. Upon initial evaluation of the lateral wall, there are numerous areas of ongoing bleeding which are identified.  The majority of this consists of venous bleeding from the muscle of the myocardium.  In this area, the muscle had been divided during the initial surgery because of the intramyocardial course of the large ramus intermediate branch of the coronary arteries.  The muscle in this area was old, dusky, and consistent with recent myocardial infarction.  All this tissue is very friable and torn.  A few small arterial bleeders are controlled including one site of bleeding from the distal anastomosis of the saphenous vein graft to the ramus intermediate branch.  The large area of venous bleeding is then controlled using a 4 x 4-cm patch of bovine pericardium.  This is utilized to buttress the epicardial surface with several interrupted 4-0 Prolene sutures to secure it in place.  Several additional pledgeted sutures are utilized as necessary in an attempt to control bleeding.  After satisfactory mechanical hemostasis is felt to be achieved, there remains continued ongoing oozing from the muscle in this area. Other attempts at direct suture control were felt to be fraught with danger due to possibility for compromise of the existing bypass grafts.  All of the proximal and distal anastomoses from the previous procedure are carefully inspected.  No other sites from the bypass grafts are found to be bleeding. All the grafts are verified patent with dopplerable flow through their vessels.  Eventually, no further sites of mechanical bleeding are felt to be continuing with ongoing hemorrhage in this area.  Rewarming is begun.  The pacing wires are replaced from the epicardial surface of the right ventricular outflow tract into the right atrium.  The intra-aortic balloon pump is resumed, and low-dose dopamine, milrinone, and epinephrine infusions are  begun.  The patient is rewarmed to  greater than 37 degrees Centigrade temperature.  The patient is weaned from cardiopulmonary bypass without difficulty.  The patients rhythm at separation from bypass is normal sinus rhythm.  Total cardiopulmonary bypass time for the operation is 88 minutes.  The venous cannula is removed uneventfully.  Protamine is administered to reverse the anticoagulation.  The patient is transfused two 10-packs of adult platelets as well as four unit fresh frozen plasma.  The patient was noted to be severely coagulopathic with platelet count measuring 26,000 during cardiopulmonary bypass.  The patient is additionally transfused one 10-pack of adult cryoprecipitate.  The aortic cannula is removed uneventfully.  The patient remained severely coagulopathic.  Avitene compound is applied to the raw surface area of the infarcted muscle on the lateral wall of the left ventricle to assist with hemostasis.  Tisseal tissue glue is also utilized.  The patient is continuously resuscitated in the operating room for an additional several hours.  Blood products are administered to attempt to correct the coagulopathy.  The patient is heated using a warm air convection device to maintain her body temperature.  Serial lab work is sent to the laboratory, and blood gases are obtained to verify adequate respiratory exchange.  Ultimately, the coagulopathy appears to improve.  The mediastinum and both left and right pleural spaces are drained with four chest tubes placed through separate stab incisions inferiorly.  The median sternotomy is closed in routine fashion.  The patient is monitored carefully after pulling the sternum together  to make sure that she remains stable both from a hemodynamic standpoint as well as respiratory standpoint.  The skin incision is closed with skin staples.  The patient is transported to the Surgical Intensive Care Unit in critical condition.  There are no intraoperative complications.  All  sponge, instrument, and needle counts are verified correct at completion of the operation. Dictated by:   Valentina Gu Roxy Manns, M.D. Attending Physician:  Darylene Price DD:  06/06/01 TD:  06/08/01 Job: [id number]

## 2010-06-23 NOTE — H&P (Signed)
NAME:  Jodi [name], Jodi [name] NO.:  [id number]   MEDICAL RECORD NO.:  [id number]                   PATIENT TYPE:  INP   LOCATION:  4707                                 FACILITY:  [place]   PHYSICIAN:  Reginia Forts, M.D.                  DATE OF BIRTH:  06/12/1932   DATE OF ADMISSION:  09/17/2001  DATE OF DISCHARGE:  09/22/2001                                HISTORY & PHYSICAL   PRIMARY CARE PHYSICIAN:  Dr. Talbert Cage.   CHIEF COMPLAINT:  Shortness of breath.   HISTORY OF PRESENT ILLNESS:  The patient is a 77 year old Caucasian female  with a past medical history significant for CHF, CAD, and hypothyroidism  presenting secondary to a progressively worsening dyspnea.  The patient  reports that she awoke early on the morning of admission secondary to severe  shortness of breath.  Due to severity of shortness of breath, the patient  was placed on home O2 on day of presentation.  Of note, patient was not O2  requiring for the previous four weeks prior to presentation.  Positive  dyspnea on exertion across bedroom.  No chest pain.  Positive heart  palpitations x 2 days.  Positive lower extremity edema x 2 days.  The  patient reports compliance with medications and compliance with low salt  diet.  The patient recently decreased Lasix dose from 40 mg b.i.d. to 40 mg  q.d.  No fevers or chills.  No cough.  No URI symptoms.  No nausea, vomiting  or diarrhea.  Positive diaphoresis this morning with shortness of breath  upon awakening.   PAST MEDICAL HISTORY:  1. CHF, systolic failure with an ejection fraction of 35-45%.  2. Status post CABG with reexploration for bleeding and tamponade lavage on     06/2001.  3. Echocardiogram in May 2003 revealing an ejection fraction of 35-45%.  4. Hospitalization in July 2003 for [facility] exacerbation for bilateral pleural     effusions and is status post thoracentesis x 3.  5. History of paroxysmal atrial fibrillation  postoperatively in May 2003.  6. Status post CABG in 1994 with status post stent placement of SVG in     05/2000.  7. Hypothyroidism.  8. Diabetes mellitus 2 with a hemoglobin A1C of 5.8.  9. Hypercholesterolemia.  10.      Depression postoperatively/reactive.  11.      Osteoarthritis.   PAST SURGICAL HISTORY:  1. Hysterectomy.  2. CABG in 1994 and 2003 and stent placement of SVG in 05/2000.   ALLERGIES:  The patient has an intolerance to VERSED.   MEDICATIONS:  1. Synthroid 50 mcg p.o. q.d.  2. Coreg 3.125 mg p.o. b.i.d.  3. Aldactone 12.5 mg p.o. q.d.  4. Altace 10 mg p.o. q.d.  5. Glucophage 500 mg p.o. q.d.  6. Lasix 40 mg p.o. q.d.  7. Aspirin 325 mg p.o. q.d.  8. Vitamins daily.  SOCIAL HISTORY:  The patient is widowed for the past 13 years.  The patient  lives with her son Legrand Como in [place].  The patient has seven children  total.  No tobacco or alcohol.  The patient is currently driving and  performing all of her ADLs except for cleaning her house.  The patient is a  retired Civil engineer, contracting.   FAMILY HISTORY:  Mother died at 32 years old secondary to pneumonia.  Mother  also underwent CABG.  Father died of leukemia at the age of 73 years old.  Brother with acute MI and sister with leukemia.   REVIEW OF SYMPTOMS:  No weight change.  Positive energy level stable and at  baseline.  Patient reports good appetite yet decreased p.o. intake.  Patient  reports a rash under her breasts for the past week.  No nausea, vomiting,  diarrhea or constipation.  No melena.  No bloody stools.  No upper  respiratory infection symptoms.  No abdominal pain.  No dysuria.   PHYSICAL EXAMINATION:  VITAL SIGNS:  Temperature is 98.1, blood pressure is  183/101, pulse is 74, respirations are 22 and O2 sat is 100% on three liters  of O2 supplementation being nasal cannula.  GENERAL:  Well-developed, well-nourished elderly female in no acute  distress.  She is alert and oriented x 3.   She is very polite and speaking  in complete sentences.  HEENT:  Pupils equal, round and reactive to light.  Sclerae nonicteric.  Nares without discharge.  Mucous membranes are moist.  Oropharynx is without  erythema.  Tympanic membranes are without erythema.  No sinus tenderness  appreciated.  NECK:  Supple without lymphadenopathy, no thyromegaly and full range of  motion appreciated.  CARDIOVASCULAR:  Regular rate and rhythm with a 2/6 systolic murmur at the  left sternal border.  Positive mild JVD appreciated.  LUNGS:  Moderate air movement.  Positive crackles to mid lung bilaterally.  No wheezing.  No accessory muscle use.  ABDOMEN:  Obese, soft, nontender, nondistended.  Positive bowel sounds,  normoactive.  Four 1 cm transverse scars/incisions appreciated at the upper  aspects of the abdomen without erythema.  EXTREMITIES:  Trace edema bilateral lower extremities.  NEUROLOGIC:  Cranial nerves II-XII are intact.  Motor is 5/5.  Sensation is  grossly intact.  SKIN:  Erythematous maculopapular rash with satellite lesions inferior  aspect of bilateral breasts.  RECTAL:  Deferred.   LABS:  White blood cells 9.3, hemoglobin 11.9, hematocrit 36.4, platelets  280, neutrophils 53%, lymphocytes 33%.  Sodium 138, potassium 3.5, chloride  101, CO2 26, BUN 10, creatinine 1.1, glucose 98, AST 20, ALT 14, alkaline  phosphatase 71, total bilirubin 0.8, total protein 7.8, albumin 3.3.  CK 41,  CK MB 1.5, index not recorded, troponin-I 0.07.  PT 13.4, INR 1.0, PTT 29.  Chest x-ray shows pulmonary edema with bilateral pleural effusions, right  greater than left.  EKG stable without ST elevation or depression.   ASSESSMENT AND PLAN:  The patient is a 77 year old Caucasian female with a  past medical history significant for congestive heart failure, systolic  dysfunction, coronary artery disease, and hypothyroidism presenting secondary to progressive dyspnea.  1. Congestive heart failure  exacerbation.  Will admit for diuresis with     Lasix therapy.  Differential diagnosis etiology including acute     myocardial infarction, thyroid abnormality, iatrogenic secondary to     recent manipulation of medications.  Will repeat EKG for confirmation of  rhythm.  Check TSH and cardiac enzymes.  Strict ins and outs and daily     weights.  Dr. Melvern Banker is aware of admission and will evaluate patient     during hospitalization.  Recent echo performed in May 2003 revealed an     ejection fraction of 35-45%.  Elevated troponins currently are consistent     with congestive heart failure exacerbation.  2. Bilateral pleural effusions.  The patient is status post three     thoracenteses with previous admissions recently.  Chest x-ray reveals     moderate effusion currently.  We will follow up chest x-ray following     diuresis overnight and readdress significance of effusions.  The patient     may want repeat thoracentesis during hospitalization.  Patient currently     stable with O2 supplementation.  No indication for thoracentesis this     evening.  3. Coronary artery disease.  Patient is status post coronary artery bypass     grafting in May 2003.  The patient is also status post previous coronary     artery bypass grafting in 1994 as well as stent placement in April 2003.     We will continue aspirin during hospitalization.  Patient with recent     fasting lipid panel in the office.  We will review results.  Patient     definitely could benefit from statin therapy considering her significant     disease.  4. Hypothyroidism.  We will check a TSH during hospitalization and continue     Synthroid therapy.  5. Diabetes mellitus type 2 recently diagnosed with last hospitalization.     We will hold Glucophage while inpatient and follow CBGs.  Patient had an     excellent hemoglobin A1C at 5.8 recently and I question diagnosis.     However, we will continue aspirin and ACE inhibitor while  in     hospitalization.  6. Hypercholesterolemia.  Follow up on recent fasting lipid panel.  7. Candidal skin infection.  Will apply the statin powder b.i.d. to the     affected area.                                               Reginia Forts, M.D.    KS/MEDQ  D:  09/22/2001  T:  09/24/2001  Job:  [id number]   cc:   Ruthann Cancer L. Erin Hearing, M.D.   Bryson Dames, M.D.

## 2010-06-23 NOTE — Consult Note (Signed)
[place]. [facility]  Patient:    Jodi [name], Jodi [name] Visit Number: [id number] MRN: [id number]          Service Type: MED Location: [id number] Attending Physician:  Camelia Phenes Dictated by:   Gaye Pollack, M.D. Proc. Date: 05/30/01 Admit Date:  05/30/2001   CC:         Alla German, M.D.   Consultation Report  REFERRING PHYSICIAN:  Alla German, M.D.  REASON FOR CONSULTATION:  Severe native and saphenous vein graft, coronary disease, status post coronary bypass surgery in 1994.  HISTORY OF PRESENT ILLNESS:  This patient is a 77 year old white female with a history of coronary artery disease, status post coronary artery bypass graft surgery x4 by me in September of 1994. She had a saphenous vein graft to the posterior descending and posterior lateral branch of the right coronary, a saphenous vein graft to the intermediate coronary, and left internal mammary graft to the LAD. She subsequently had an abnormal Cardiolite scan in April of 2002, and cardiac catheterization showed severe saphenous vein graft disease of the intermediate graft. She had two stents placed in the intermediate graft, one being in the ostium. At that time, the left internal mammary graft was patent. There was a 90% stenosis in the saphenous vein graft to the right coronary artery with left to right collaterals. Ejection fraction was about 50%. She now presents with a two-week history of intermittent substernal chest pressure and increased belching. She took antacids without relief. Early this morning about 2 a.m. she developed severe substernal chest pressure with radiation to her teeth. This did not change with nitroglycerin at home but improved with nitroglycerin and oxygen via EMS. In the emergency room she became pain free. Her electrocardiogram showed normal sinus rhythm with PACs. There were small inferior Q waves. Troponin level was 0.52 and her CPK was  96. She ruled in for subendocardial MI. She subsequently underwent cardiac catheterization today which showed severe native three-vessel disease with occlusion of the proximal LAD. There was a patent left internal mammary graft to the LAD, but there was a tight ostial stenosis at the bifurcation of the diagonal branch off the LAD. The saphenous vein graft to the intermediate was occluded. There was a first marginal branch that had 90% hazy proximal stenosis. The right coronary artery was occluded. The saphenous vein graft to the right coronary artery was occluded. Left ventricular ejection fraction was about 25-30% with global hypokinesis. There was no gradient across the aortic valve. There was mild mitral regurgitation, but I could not tell if this was due to PVCs.  PAST MEDICAL HISTORY:  Significant for coronary artery disease, as mentioned above. She has a history of hypertension and hyperlipidemia. She is status post hysterectomy in 1991.  MEDICATIONS: 1. Zocor 80 mg q.d. 2. Aspirin 1 q.d. 3. Toprol q.d. 4. Cozaar. 5. Nifedipine.  REVIEW OF SYSTEMS:  CONSTITUTIONAL:  She has had no fever or chills. She has had no recent weight changes. She has had some fatigue. EYES:  Negative. ENT: Negative. ENDOCRINE:  She denies diabetes and hypothyroidism. CARDIOVASCULAR: As above. She has had no PND or orthopnea. She denies peripheral edema. RESPIRATORY:  She denies cough and sputum production. GI:  She has had no nausea or vomiting. She denies melena and bright red blood per rectum. GU: She denies dysuria and hematuria. NEUROLOGICAL:  She has had no focal weakness or numbness. She denies dizziness and syncope. HEMATOLOGIC:  She has no  history of bleeding disorders or easy bleeding. ALLERGIES:  None. PSYCHIATRIC:  Negative.  FAMILY HISTORY:  Significant for coronary disease. Her mother had coronary bypass in her 102s, and her father died in his 74s. She has one brother with coronary  disease and has had coronary bypass surgery in his 18s.  SOCIAL HISTORY:  She is a widow. She has never smoked and denies alcohol abuse.  PHYSICAL EXAMINATION:  VITAL SIGNS:  Blood pressure 130/80 and her pulse is 75 and regular. Respiratory rate is 16, unlabored.  GENERAL:  She is a well-developed white female in no distress.  HEENT:  Normocephalic, atraumatic. The pupils are equal and reactive to light and accommodation. Extraocular muscles are intact. Her throat is clear.  NECK:  Normal carotid pulses bilaterally. There are no bruits. There is no adenopathy or thyromegaly.  CARDIAC:  Regular rate and rhythm with a grade 2/6 systolic murmur at the left sternal border.  LUNGS:  Clear.  ABDOMEN:  Active bowel sounds. Her abdomen is soft, mildly obese, and nontender. There are no palpable masses and no organomegaly.  EXTREMITIES:  No peripheral edema. There are saphenous vein harvest incisions in both lower legs. Pedal pulses are palpable bilaterally.  NEUROLOGIC:  Exam shows her to be alert and oriented x3. Motor and sensory exams are grossly normal.  IMPRESSION:  This patient has severe native three-vessel coronary disease that progressed and saphenous vein graft disease with occlusion of all of her saphenous vein grafts and a patent left internal mammary graft. She presents with unstable angina and moderate to severe left ventricular dysfunction. She is ruled in for a subendocardial myocardial infarction. She has a new heart murmur but there is no gradient across the aortic valve and I do not see any significant mitral regurgitation on angiogram. Echocardiogram is pending. I agree that redo coronary artery bypass graft would be the best treatment for this patient. According to her, she had some varicose vein when we did her surgery. I do not have her operative note with me at this time. It may be beneficial to use her right internal mammary and a radial artery graft.  I  discussed the redo coronary bypass surgery with her, including alternatives, benefits, and risks. She is hesitant to agree to redo surgery at this time but will discuss it further with cardiology. I will be on vacation until Jun 09, 2001. If she remains stable and can wait for me to do her surgery, I will plan to do it on May 5. If she requires surgery sooner, then one of my partners will be available. Dictated by:   Gaye Pollack, M.D. Attending Physician:  Camelia Phenes DD:  05/30/01 TD:  05/30/01 Job: [id number]

## 2010-06-23 NOTE — Discharge Summary (Signed)
[place]. [facility]  Patient:    Jodi [name], Jodi [name] Visit Number: [id number] MRN: [id number]          Service Type: ECR Location: [facility] [id number] [id number] Attending Physician:  Camelia Phenes Dictated by:   Erlene Quan, P.A.C. Admit Date:  07/09/2001 Discharge Date: 07/21/2001   CC:         Jodi [name]. Jodi [name], M.D.  Bufford Buttner, M.D.  Burnett Harry Joya Gaskins, M.D. [facility]   Discharge Summary  DISCHARGE DIAGNOSES: 1. Debilitation, status post complicated re-do bypass surgery, improved    at discharge. 2. Coronary disease, coronary artery bypass grafting in 1994 with SVG to    intermediate intervention and stenting in April of 2002. Admitted on    May 30, 2001 with SEMI, followed by re-do bypass grafting, including    an RIMA to the intermediate, SVG to the circumflex, SVG to the posterior    lateral, and SVG to the OM on Jun 05, 2001 by Dr. Roxy Manns. 3. Postop complications including postop bleeding requiring re-operation    and pericardial patch to the LV on Jun 06, 2001 and sternal closure on    Jun 10, 2001. 4. Postoperative paroxysmal atrial fibrillation, holding sinus rhythm at    discharge, on Amiodarone. 5. Moderate LV dysfunction with an EF of 40-45% by echo on Jun 27, 2001. 6. Treated hypertension. 7. Depression, seen by Dr. Rhona Raider this admission. 8. Hypothyroidism, see by Dr. Electa [name] this admission. 9. Hyperlipidemia, currently not treated with medications. 10.Non-insulin dependent diabetes.  HOSPITAL COURSE: Jodi [name] is a 77 year old female with coronary disease as described above. She had bypass in 1994 with a stent to the SVG to intermediate in April of 2002. She was admitted Jun 29, 2001 with an SEMI and ultimately had re-do bypass grafting with grafts as described above. She had postop bleeding requiring re-operation on Jun 06, 2001 with placement of a pericardial patch to the left ventricle. She had sternal closure on Jun 10, 2001. She  also had postop problems with transient renal insufficiency, significant depression and PAF. She did have an echocardiogram that showed improvement in her LV function on Jun 27, 2001 to 40-45%. She was ultimately transferred to [facility] on July 09, 2001. She was followed by Dr. Elyse Hsu and Dr. Electa [name]. She did have problems with dropping her O2 sats during the night and we suspected sleep apnea. She was seen by Dr. Lyda Jester for this. She will need to be set up for a sleep study after discharge. The patient is stable for discharge home on July 21, 2001.  DISCHARGE MEDICATIONS: 1. Aspirin q.d. 2. Lopressor 25 mg b.i.d. 3. Paxil 10 mg per day. 4. Amiodarone 200 mg per day. 5. Potassium 20 meq per day. 6. Lasix 40 mg per day. 7. Ventolin and Atrovent inhalers q.i.d. 8. Ritalin 2.5 mg b.i.d. 9. Voltex q.d. 10.Xanax 0.25 mg b.i.d. 11.Multivitamin q.d. 12.Glucophage 500 mg q.d. 13.Haldol 1 mg h.s. 14.Lovenox 0.025 mg q.d. 15.Norvasc 5 mg q.d. 16.Altace 10 mg q.d.  LABORATORY DATA: On July 18, 2001, sodium was 135, potassium 4.1, BUN 15, creatinine 0.9, white count 7.6, hemoglobin 9.4, hematocrit 28.2, platelets 272. UA unremarkable. Urine culture was negative. ABG on room air done on July 15, 2001 showed pH of 7.461, pCO2 40.9, pO2 66.1.  DISPOSITION: The patient is discharged from [facility] and will follow-up with Dr. Melvern Banker. She will need to be set up for a sleep study when we see her back in the office. At  some point, we may consider tapering her off her Xanax and Haldol. She will need a BMP in the office when we see her back in follow-up. Dictated by:   Erlene Quan, P.A.C. Attending Physician:  Camelia Phenes DD:  07/21/01 TD:  07/22/01 Job: [id number]

## 2010-06-23 NOTE — Discharge Summary (Signed)
[place]. [facility]  Patient:    Jodi [name], Jodi [name]                   MRN: [id number] Adm. Date:  [id number] Disc. Date: [id number] Attending:  Camelia Phenes Dictator:   Berlin Hun, P.A. CC:         Jeb Levering. Erin Hearing, M.D.   Discharge Summary  DATE OF BIRTH:  [birth year]  [facility] MEDICAL RECORD NUMBER:  [phone]  ADMISSION DIAGNOSES: 1. Abnormal Cardiolite revealing anterolateral and apical ischemia. 2. Known coronary artery disease, status post coronary artery bypass graft in    1994. 3. Hypertension. 4. Mild atherosclerosis.  DISCHARGE DIAGNOSES: 1. Abnormal Cardiolite, status post cardiac catheterization, May 28, 2000,    revealing high grade saphenuos vein graft to the ramus and saphenuos vein    graft to the right coronary artery disease. 2. Known coronary artery disease, status post coronary artery bypass graft in    1994. 3. Hypertension. 4. Mild atherosclerosis. 5. Staged intervention, May 27, 2000, to the saphenuos vein graft to the    ramus.  The left circumflex territory collateralizes the right coronary    artery.  HISTORY OF PRESENT ILLNESS:  Ms. Jodi [name] is a 77 year old African-American female patient of Dr. Melvern Banker who presented [facility] on May 28, 2000, for elective cardiac catheterization after Cardiolite showed significant anterolateral and apical ischemia on May 22, 2000.  The patient was admitted to [facility] for outpatient cardiac catheterization, May 28, 2000.  This revealed known high grade native CAD, IMA to the LAD was patent, SVG to the ramus had an ostial 75% lesion and 99% eccentric midlesion, and the SVG to the RCA had 90, 80 and 70% lesions in its proximal to midportion.  EF of 50%.  Because of the high grade CAD and abnormal Cardiolite, Dr. Melvern Banker felt the patient would be best served by being admitted to [facility] for staged intervention.  PROCEDURE: 1. Cardiac catheterization, May 28, 2000, diagnostic by Dr. Melvern Banker. 2. Staged graft intervention to the SVG to the ramus on May 29, 2000.  COMPLICATIONS:  None.  HOSPITAL COURSE:  Ms. Jodi [name] was admitted to [facility] after catheterization showed SVG to the ramus and SVG to the RCA graft disease. She remained pain-free overnight.  Laboratory studies showed a potassium of 3.3, BUN 20, creatinine 0.9, normal CBC and negative cardiac enzymes.  Total cholesterol 196, triglycerides 177, HDL 43, and LDL 118. Potassium was repleted and follow-up potassium on April 25, was 3.6.  On May 29, 2000, Dr. Melvern Banker took the patient back to the catheterization lab and chose to intervene upon the SVG to the intermediate ramus branch. The ostial 75% lesion was reduced to 0% and the midbody lesion of 99% was reduced to 0%.  The patient tolerated the procedure well and there were no problems during the case.  Integrilin was bolus infused x 18 hours.  Prior to sheath pull, the patients pressure dropped from 138/63 to 68/29. She was given fluid bolus and placed on Trendelenburg.  Blood pressure returned to 142/62 after sheath pull and the patient remained stable thereafter.  On May 30, 2000, EKG confirmed normal sinus rhythm with one PVC, otherwise unremarkable.  Cardiac enzymes were negative.  The patient was deemed stable for discharge to home by Dr. Melvern Banker.  DISCHARGE MEDICATIONS:  1. Enteric-coated aspirin 325 mg a day.  2.  Plavix 75 mg a day for a month.  3. Cozaar 50 mg a day.  4. Zocor 80 mg a day.  5. Procardia XL 90 mg a day.  6. Procardia XL 30 mg a day.  7. Hydrochlorothiazide 25 mg a day.  8. Toprol XL 25 mg 1/2 a day.  9. KCL 10 mEq a day. 10. Calcium and vitamins. 11. Nitroglycerin as needed for chest pain.  ACTIVITY:  No strenuous activity, lifting more than 5 pounds, or driving for two  days.  DIET:  Low fat, low cholesterol, low salt diet.  May shower.  Persantine Cardiolite will be scheduled by the office and they will call the patient with this appointment.  She will see Dr. Melvern Banker on June 6, at 3:40 p.m. DD:  06/12/00 TD:  06/13/00 Job: [id number]

## 2010-06-23 NOTE — Op Note (Signed)
[place]. [facility]  Patient:    Jodi [name], Jodi [name] Visit Number: [id number] MRN: [id number]          Service Type: MED Location: [id number] Attending Physician:  Darylene Price Dictated by:   Valentina Gu. Roxy Manns, M.D. Proc. Date: 06/05/01 Admit Date:  05/30/2001   CC:         Bryson Dames, M.D.  CVTS office   Operative Report  PREOPERATIVE DIAGNOSES:  Severe three-vessel coronary artery disease and vein graft disease, status post coronary artery bypass grafting in 1994, status post acute myocardial infarction with Class IV post-infarction angina.  POSTOPERATIVE DIAGNOSES:  Severe three-vessel coronary artery disease and vein graft disease, status post coronary artery bypass grafting in 1994, status post acute myocardial infarction with Class IV post-infarction angina.  PROCEDURE:  Redo median sternotomy for redo coronary artery bypass grafting x4 (free right internal mammary artery to ramus intermediate branch, saphenous vein graft to first circumflex marginal branch, saphenous vein graft to right posterolateral branch, saphenous vein graft to ramus intermediate branch).  SURGEON:  Valentina Gu. Roxy Manns, M.D.  ASSISTANT:  Mr. Lynnell Catalan, P.A.  ANESTHESIA:  General.  BRIEF CLINICAL NOTE:  The patient is a 77 year old white female with history of coronary artery disease, hypertension, and hyperlipidemia.  She has a longstanding history of coronary artery disease and underwent coronary artery bypass grafting x4 by Dr. Arvid Right in 9/94.  Her initial surgical procedure was complicated postoperatively by prolonged pain and numbness involving her left upper extremity which was attributed to likely brachial plexus injury.  Her symptoms gradually improved.  She developed recurrent coronary artery disease at a later date and has undergone numerous percutaneous coronary interventions since that time.  She was admitted to the hospital on 4/25 with  severe substernal chest pain occurring at rest.  She ruled in for an acute myocardial infarction.  She underwent cardiac catheterization by Dr. Tami Ribas demonstrating that the last remaining vein graft from the original surgery had now become occluded.  Her left internal mammary artery graft remained patent to the left anterior descending coronary artery.  There was severe native three-vessel coronary artery disease.  There was moderate-to-severe left ventricular dysfunction with global hypokinesis an overall ejection fraction estimated at 25-30%.  Since that time, the patient has had recurrent episodes of chest pain at rest while she remained in the hospital on medical therapy.  The patient was initially seen in consultation by Dr. Cyndia Bent on 4/25 and felt to be a candidate for redo coronary artery bypass grafting.  Because of recurrent symptoms of post-infarction angina, the patient was scheduled for surgery as soon as practical.  OPERATIVE CONSENT:  The patient and her family have been counseled at length regarding the indications and potential benefits of redo coronary artery bypass grafting.  They understand the somewhat increased risks of surgery associated with redo operations.  Alternative treatment strategies have been discussed including prolonged medical therapy versus another attempt at percutaneous-based coronary intervention.  They understand and accept all associated risks of surgery including but not limited to risks of death, stroke, myocardial infarction, congestive heart failure, respiratory failure, bleeding requiring blood transfusion, arrhythmia, infection, recurrent coronary artery disease.  All their questions have been addressed.  OPERATIVE NOTE IN DETAIL:  The patient is brought to the operating room on the above-mentioned date, and central invasive hemodynamic monitoring is established by the anesthesia service under the care and direction of [employer]. Lutricia Feil,  M.D.  Specifically, a Swan-Ganz catheter  is placed through the right internal jugular approach.  A right radial arterial line is placed.  The patient is placed in the supine position on the operating table. Following induction with general endotracheal anesthesia, a Foley catheter is placed.  The patients left hand is examined.  The radial pulse is somewhat thin and frail suggestive that the underlying artery is relatively small caliber. However, the [facility] test is normal.  A pulse oximetry probe is placed on the patients left index finger, and the pulse oximetry waveform is monitored while the radial artery pulse is briefly occluded.  There is no significant change in the pulse oximetry waveform.  The patients chest, left upper extremity, abdomen, both groins, and both lower extremities are prepared and draped in a sterile manner.  A longitudinal incision is made in the patients left thigh, and the left greater saphenous vein is identified.  This vein appears to be suitable quality conduit for grafting.  The saphenous vein is the procured from the entire left thigh and the upper portion of the left lower leg through a series of longitudinal incisions.  Because the saphenous vein is felt to be acceptable quality, use of the left radial artery is felt not to be necessary.  Redo median sternotomy incision is performed.  The sternum is re-entered using an oscillating saw without difficulty.  Dissection is subsequently begun to free up the tissues underlying the sternum.  This is technically straightforward.  Subsequently, the right internal mammary artery is dissected from the chest wall and prepared for bypass grafting.  The right internal mammary artery is small-to-medium size caliber but appears to be good quality and has excellent forward flow.  Further dissection of the structures of the anterior mediastinum is commenced.  Sharp dissection is utilized beginning along the acute margin  of the right ventricle and the right atrium and extending up to the ascending aorta.  The ascending aorta is dissected from associated structures and an appropriate site for cannulation is chosen.  The patient is heparinized systemically. Heparin dosing is performed taking into account that the patient is receiving continuous infusion of aprotinin.  The ascending aorta is cannulated without difficulty.  Further sharp dissection is continued freeing up the right atrium and the inferior border of the right heart.  A retrocardioplegia catheter is placed through the right atrium into the coronary sinus.  A venous cannula is placed through the right atrium with its tip extending down into the inferior vena cava.  Adequate heparinization is verified.  Of note, the patient seems quite resistant to heparin requiring an excessively large dose.  The pharmacy is contacted for use of antithrombin-III, but the pharmacy will not provide it because of not having a baseline antithrombin-III level.  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  The patients activated clotting times subsequently become elevated greater than 600 and are continuously monitored throughout the remainder of the case in a similar fashion.  Cardiopulmonary bypass is begun.  Further sharp dissection is performed freeing up the structures surrounding the heart circumferentially.  This is technically fairly straightforward.  The remaining patent left internal mammary graft to the left anterior descending coronary artery is carefully identified, and dissection is avoided in this area in order to avoid injury to this vessel.  Of note, the patient has evidence of recent large lateral wall myocardial infarction.  The myocardium in this vicinity is dusky and very friable.  Distal sites are selected for coronary bypass grafting.   A cardioplegia catheter is placed in the ascending aorta.  The patient is cooled to 30 degrees systemic temperature.  The aortic cross-clamp is applied, and cardioplegia is delivered initially in an antegrade fashion through the aortic root.  A Fogarty vascular clamp is placed across the left internal mammary artery pedicle.  A temperature probe is placed in the left ventricular septum to monitor interventricular septal temperature.  The initial cardioplegic arrest and myocardial cooling are felt to be satisfactory.  Additional cardioplegia is administered retrograde to facilitate adequate myocardial cooling.  Repeat doses of cardioplegia are administered intermittently throughout the cross-clamp portion of the operation, both through the aortic root, antegrade down subsequently placed vein grafts, and retrograde through the coronary sinus catheter to maintain septal temperature below 15 degrees Centigrade.  The following distal coronary anastomoses are performed:  1. The posterolateral branch off the distal right coronary artery is grafted with a saphenous vein graft in an end-to-side fashion.  This coronary measures 1.2 mm in diameter and is of fair-to-poor quality.  The posterior descending coronary artery is not graftable.  2. The first circumflex marginal branch is grafted with a saphenous vein graft in an end-to-side fashion.  This coronary artery measures 1.0 mm in diameter and is of fair-to-poor quality.  3. The large ramus intermediate branch is grafted with the right internal mammary artery as a free graft in an end-to-side fashion.  This coronary artery is intramyocardial.  It measures 1.8-2.0 mm in diameter.  It is moderately diseased.  Both proximal saphenous vein anastomoses are performed directly to the ascending aorta prior to removal of the aortic cross-clamp.  The site of proximal anastomosis chosen corresponds to the site of original two proximal vein graft anastomoses  from the patients original surgery.  The left internal mammary artery is reopened, and the septal temperature is noted to rise rapidly and dramatically.  One final dose of warm retrograde hotshot cardioplegia is administered.  The aortic cross-clamp is removed after a total cross-clamp time of 92 minutes.  The proximal anastomosis of the free right internal mammary artery graft is placed in an end-to-side fashion to the hood of the proximal end of the vein graft to the circumflex marginal branch.  All proximal and distal anastomoses are inspected for hemostasis and appropriate graft orientation.  Of note, there is some bleeding from the myocardium in the vicinity of the lateral wall of the left ventricle.  An attempt is made to control this with several pledgetted sutures, although this tissue is very friable.  Epicardial pacing wires are affixed to the right ventricular outflow tract into the right atrial appendage.  The patient is rewarmed to greater than 37 degrees Centigrade temperature.  The patient is weaned from cardiopulmonary bypass on low-dose dopamine infusion.  The patients rhythm at separation from bypass initially is junctional rhythm requiring A-V sequential pacing.  Initially, the patients hemodynamic status appeared well, although within a few minutes, the patients EKG changed with ST segment elevation diffusely across the inferior and lateral wall.  Transesophageal echocardiogram demonstrates progressive worsening of left ventricular function with particularly inferior wall akinesis and inferolateral akinesis.  The initial cardiopulmonary bypass time was 154 minutes.  Cardiopulmonary bypass is briefly reinstituted for another 12 minutes, and all grafts are checked using intraoperative sterile Doppler ultrasound probe.  There appears to be normal blood flow in all of the grafts. There is no sign of air appreciated on transesophageal echocardiogram.  The patients dopamine  infusion is increased.  Another attempt at wean from cardiopulmonary bypass is performed, but again is unsuccessful with the similar-appearing electrocardiographic changes.   A cardioplegia catheter is placed in the ascending aorta, and another segment of saphenous vein is obtained from the patients right thigh through a series of longitudinal incisions.  The saphenous vein from the right thigh is very large caliber, but acceptable for grafting under the circumstances.  It is presumed that the new right internal mammary artery graft does not carry adequate flow to supply the large ramus intermediate branch, and that this may be responsible for the patients signs of ongoing ischemia, despite functioning all four bypass grafts.  Cooling is again begun, and the aortic cross-clamp is reapplied.  Cardioplegia is administered in antegrade fashion through the aortic root.  The left internal mammary artery pedicle is again transiently clamped.  After satisfactory cooling, the heart is again elevated. Further dissection of the large ramus intermediate branch is continued distal to the level of the previous anastomosis.  The artery is opened, and a 1.5-mm probe will easily pass through the anastomosis into the right internal mammary graft.  The new saphenous vein graft is sewn in an end-to-side fashion directly to the ramus intermediate branch.  The single proximal saphenous vein anastomosis is performed to a new site on the ascending aorta.  The septal temperature is again noted to rise rapidly and dramatically upon reperfusion of the left internal mammary artery.  The aortic cross-clamp is removed after a second cross-clamp time of 42 minutes, such that the total cross-clamp time for the entire procedure is 134 minutes.  The heart begins to beat spontaneously without need for cardioversion.  The patient is prewarmed to greater than 37 degrees Centigrade temperature.  All proximal and distal  anastomoses are inspected for hemostasis and appropriate graft orientation.  Epicardial pacing wires are affixed to the right ventricular outflow tract and into the right atrial appendage.  An initial attempt to wean from cardiopulmonary bypass is begun, although at this point, only on low-dose dopamine infusion.  Left ventricular function appears sluggish so that cardiopulmonary bypass is reinstituted.  The left femoral artery is cannulated using the Seldinger technique.  A 30-cc intra-aortic balloon pump is placed through the left common femoral artery. The placement of the balloon is verified using transesophageal echo to assure the appropriate location of the balloon.  The patient is begun on low-dose epinephrine and milrinone infusion.  The patient weaned from cardiopulmonary bypass without difficulty.  The patients rhythm at separation from bypass is normal sinus rhythm.  The patient is weaned from bypass on low-dose milrinone and epinephrine infusions as well as low-dose dopamine.  The epinephrine infusion is gradually weaned off during the remainder of the operation.  Total cardiopulmonary bypass time for the operation is 281 minutes.  Follow-up transesophageal echocardiogram performed after separation from bypass demonstrates preserved left ventricular function at this juncture. There remains only mild mitral regurgitation.  No other significant abnormalities are identified.  Mediastinum and both left and right pleural spaces are irrigated with saline solution containing vancomycin.  Surgical hemostasis is ascertained, although there is considerable coagulopathy and ongoing bleeding.  The patient is transfused a total of four units fresh frozen plasma and two 10-packs of platelets.  An exhaustive search for sites of mechanical bleeding is performed.  There remains some venous oozing from the lateral wall of the left ventricle.  This is watched for a considerable period of time  and seems to be slowing.  The mediastinum and both left and right pleural spaces are drained with four chest tubes placed through separate stab incisions inferiorly.  The median sternotomy is closed in routine fashion.  The deep subcutaneous tissues of the right thigh are drained with a 19-French Blake drain.  All lower extremity incisions are closed in multiple layers in routine fashion.  All skin incisions are closed with subcuticular skin closure.  The patient tolerated the procedure reasonably well under the circumstances and was transported to the Surgical Intensive Care Unit in satisfactory condition.  The patient was observed in the operating room for a period of 60 minutes prior to transport to make sure that chest tube output was satisfactory.  There are no intraoperative complications.  All sponge, instrument, and needle counts are verified correct at completion of the operation.  The patient was additionally transfused a total of four units packed red blood cells during cardiopulmonary bypass for anemia. Dictated by:   Valentina Gu Roxy Manns, M.D. Attending Physician:  Darylene Price DD:  06/05/01 TD:  06/08/01 Job: [id number]

## 2010-06-23 NOTE — H&P (Signed)
[place]. [facility]  Patient:    Jodi [name], Jodi [name] Visit Number: [id number] MRN: [id number]          Service Type: MED Location: [id number] Attending Physician:  Berry, Jonathan Martinique Dictated by:   Erlene Quan, P.A.C. Admit Date:  08/17/2001   CC:         Marshall L. Erin Hearing, M.D.   History and Physical  CHIEF COMPLAINT:  Shortness of breath.  HISTORY OF PRESENT ILLNESS:  The patient is a 77 year old female who was recently discharged from [facility] after a complicated hospital course, status post bypass surgery.  She has a history of coronary disease, had bypass surgery in 1994, stent of the SVG to intermittent 4/02, and presented in May with an SEMI.  Subsequently, a catheterization was done, and she had bypass surgery with an RIMA to the intermittent, SVG to the circumflex, SVG to the PL, and SVG to the OM by Dr. Roxy Manns.  Postoperatively, she had bleeding requiring reoperation with an LV patch and delayed closure of the sternum.  She had a long rehabilitation course with severe depression, PAF, hypothyroidism, and respiratory trouble.  She was ultimately discharged 07/21/01.  She did well initially after discharge, and her antidepressants and amiodarone were discontinued as an outpatient.  She saw Dr. Roxy Manns about a week ago, and her Lasix was discontinued.  She is brought to the emergency room today by her daughter, who noted increasing shortness of breath and dyspnea on exertion for the last two to three days.  She did talk to Dr. Melvern Banker yesterday, who had her take Lasix, which she did without results.  She is admitted now for further evaluation.  PAST MEDICAL HISTORY:  As above, plus she has a history of non-insulin-dependent diabetes, hyperlipidemia, remote hysterectomy, and hypothyroidism.  She has seen Dr. Electa Sniff in the past.  CURRENT MEDICATIONS:  1. Altace 10 mg a day.  2. Norvasc 5 mg a day.  3. Glucophage 500 mg a day.  4. Aspirin  q.d.  5. Synthroid 0.025 mg a day.  6. Lopressor 25 mg b.i.d.  ALLERGIES:  No known drug allergies but is intolerant to VERSED.  SOCIAL HISTORY:  She lives with her son.  She is a widow, has seven children, 15 grandchildren, seven great grandchildren.  FAMILY HISTORY:  Coronary disease; one brother had an MI, died in his 56s; mother had a bypass in her 62s.  REVIEW OF SYSTEMS:  Essentially unremarkable except for noted above.  She did have an echocardiogram done 06/27/01 and showed her EF to be 40-45%.  PHYSICAL EXAMINATION:  VITAL SIGNS:  Blood pressure 145/80, pulse 72, respirations 18.  GENERAL:  She is a well-developed, well-nourished female, no acute distress. She does have a flat affect.  NECK:  Without bruit.  She has slight elevated JVD.  HEENT:  Normocephalic, extraocular movements are intact.  CHEST:  Diminished breath sounds one-third of the way up bilaterally.  CARDIAC:  Regular rate and rhythm with a soft systolic murmur at the left sternal border.  There is no appreciable S3.  ABDOMEN:  Nontender.  There is no hepatosplenomegaly.  EXTREMITIES:  1-2+/4 edema bilaterally with good distal pulses.  NEUROLOGICAL:  Grossly intact.  X-RAYS:  Chest x-ray shows atelectasis and effusion in both bases.  LABORATORY DATA:  Pending.  ELECTROCARDIOGRAM:  Pending.  IMPRESSION:  1. Congestive heart failure with bilateral pleural effusions.  2. Moderate left ventricle dysfunction with an ejection fraction of 40-45% by  echocardiogram 06/27/01.  3. Coronary disease, coronary artery bypass grafting in 1994 with a stent     placement in 4/02 and redo surgery with postoperative complications     [id number].  4. Depression.  The patients daughter says this has recently been improving,     and she has been doing well off of medications.  5. Non-insulin-dependent diabetes.  6. Treated hypothyroidism.  7. Paroxysmal atrial fibrillation postoperatively, amiodarone had been      stopped as an outpatient.  8. Question of sleep apnea.  This was brought up by Dr. Joya Gaskins during her     rehabilitation stay.  She is scheduled to have an outpatient sleep study.  PLAN:  She will be admitted to telemetry, will continue her home medicines plus Lasix.  We will check a BNP.  She may need ultrasound-guided thoracentesis by radiology. Dictated by:   Erlene Quan, P.A.C. Attending Physician:  Berry, Jonathan Martinique DD:  08/17/01 TD:  08/19/01 Job: [id number]

## 2010-06-23 NOTE — Op Note (Signed)
Jodi [name], Jodi [name]            ACCOUNT NO.:  [id number]   MEDICAL RECORD NO.:  [id number]          PATIENT TYPE:  OIB   LOCATION:  2550                         FACILITY:  [facility]   PHYSICIAN:  Jefry H. Constance Holster, MD     DATE OF BIRTH:  1932-04-05   DATE OF PROCEDURE:  12/21/2005  DATE OF DISCHARGE:                               OPERATIVE REPORT   PREOPERATIVE DIAGNOSIS:  Left tympanic membrane perforation.   POSTOPERATIVE DIAGNOSIS:  Left tympanic membrane perforation.   PROCEDURE:  Left medial graft tympanoplasty.   SURGEON:  Jefry H. Constance Holster, MD   General endotracheal anesthesia was used.  No complications and no blood  loss.   FINDINGS:  Near complete pars tensa perforation, left tympanic membrane.  Clear middle ear.  Intact ossicular chain.  There was thick mucoid  material contained within the middle ear and the mastoid cavity.  It was  suctioned out through the middle ear.  There was some tympanosclerotic  plaque adherent to the anterior aspect of the lateral process of the  malleus.   REFERRING PHYSICIAN:  [facility].   HISTORY:  This is a 77 year old lady with a history of chronic  perforations bilaterally and intermittently chronic drainage with mixed  hearing loss.  Risks, benefits, alternatives, complications of the  procedure were explained to the patient, who seemed to understand and  agreed to surgery.   PROCEDURE:  The patient was taken to the operating room and placed on  the operating table in supine position.  Following induction of general  endotracheal anesthesia, the left ear was prepped and draped in standard  fashion and the table was turned to the proper orientation.  Xylocaine  1% with epinephrine was infiltrated into the external auditory canal and  the postauricular sulcus.  A vascular strip was created in the posterior  canal with radial incisions at 4 o'clock and 8 o'clock.  A postauricular  incision was created using electrocautery  and a graft was harvested from  the loose areolar tissue superficial to the temporalis fascia.  This was  pressed and dried on the back table and then later cut to size and shape  with a V notch for the angle of the malleus.  The linea temporalis and  mastoid periosteum were incised and the ear was brought forward with a  vascular strip.  A microscope was used to visualize the tympanic  membrane and the edges of the perforation were freshened using an  otologic pick and cup forceps.  A tympanomeatal flap was then developed  and brought forward.  It was bisected to facilitate the dissection.  An  angled a knife was used to elevate the skin off the annulus in all  directions.  An otologic pick was used to dissect the native drum and  tympanosclerotic plaque off of the lateral process of the malleus.  The  middle ear was otherwise unremarkable.  The thick mucoid secretions were  aspirated.  The ossicular chain was palpated, and there was good  mobility.  The chorda tympani nerve was identified and preserved.  The  tympanic cavity was  packed with saline-soaked Gelfoam pieces, packing  tightly up in the anterior aspect to occlude the eustachian tube orifice  and otherwise packing with sufficient packing to support the graft.  The  graft was then laid into position and tucked under the edges of the  annulus all around.  The V notch was placed under the handle of malleus  and wrapped around the lateral aspect of the handle.  The bivalved  tympanomeatal flap was then laid on top of the graft.  The ear canal was  then packed with Ciprodex-soaked Gelfoam.  The mastoid periosteum was  reapproximated with chromic suture and a subcuticular chromic closure  was then accomplished.  The postauricular skin was reapproximated using  Dermabond.  The external meatus was inspected and the vascular strip was  returned to its normal position with additional packing in the ear  canal.  A cotton ball was placed at  the meatus and a Glasscock mastoid  dressing was applied.  The patient was then awakened, extubated, and  transferred to recovery in stable condition.      Jefry H. Constance Holster, MD  Electronically Signed     JHR/MEDQ  D:  12/21/2005  T:  12/21/2005  Job:  [id number]   cc:   [place]

## 2010-07-10 ENCOUNTER — Other Ambulatory Visit: Payer: Self-pay | Admitting: Family Medicine

## 2010-07-10 NOTE — Telephone Encounter (Signed)
Refill request

## 2010-07-25 ENCOUNTER — Other Ambulatory Visit: Payer: Self-pay | Admitting: Family Medicine

## 2010-07-25 NOTE — Telephone Encounter (Signed)
Refill request

## 2010-07-26 NOTE — Telephone Encounter (Signed)
Please call in the hydrocodone prescription  Thanks  Sayre

## 2010-07-26 NOTE — Telephone Encounter (Signed)
Rx called in 

## 2010-09-04 ENCOUNTER — Other Ambulatory Visit: Payer: Self-pay | Admitting: Cardiovascular Disease

## 2010-09-05 ENCOUNTER — Other Ambulatory Visit: Payer: Self-pay | Admitting: Family Medicine

## 2010-09-05 ENCOUNTER — Telehealth: Payer: Self-pay

## 2010-09-05 NOTE — Telephone Encounter (Signed)
Refill request

## 2010-09-05 NOTE — Telephone Encounter (Signed)
Notified pharmacy need to contact PCP Talbert Cage for refills on zetia. This patient has not seen Dr.Gollan.

## 2010-10-25 ENCOUNTER — Ambulatory Visit (INDEPENDENT_AMBULATORY_CARE_PROVIDER_SITE_OTHER): Payer: PRIVATE HEALTH INSURANCE | Admitting: Family Medicine

## 2010-10-25 ENCOUNTER — Encounter: Payer: Self-pay | Admitting: Family Medicine

## 2010-10-25 VITALS — BP 138/76 | HR 54 | Temp 97.9°F | Wt 163.0 lb

## 2010-10-25 DIAGNOSIS — I5022 Chronic systolic (congestive) heart failure: Secondary | ICD-10-CM

## 2010-10-25 DIAGNOSIS — E7800 Pure hypercholesterolemia, unspecified: Secondary | ICD-10-CM

## 2010-10-25 DIAGNOSIS — I10 Essential (primary) hypertension: Secondary | ICD-10-CM

## 2010-10-25 DIAGNOSIS — Z23 Encounter for immunization: Secondary | ICD-10-CM

## 2010-10-25 LAB — COMPREHENSIVE METABOLIC PANEL
ALT: 14 U/L (ref 0–35)
AST: 22 U/L (ref 0–37)
Albumin: 4.7 g/dL (ref 3.5–5.2)
Alkaline Phosphatase: 59 U/L (ref 39–117)
BUN: 38 mg/dL — ABNORMAL HIGH (ref 6–23)
CO2: 28 mEq/L (ref 19–32)
Calcium: 10.1 mg/dL (ref 8.4–10.5)
Chloride: 103 mEq/L (ref 96–112)
Creat: 1.64 mg/dL — ABNORMAL HIGH (ref 0.50–1.10)
Glucose, Bld: 109 mg/dL — ABNORMAL HIGH (ref 70–99)
Potassium: 4.4 mEq/L (ref 3.5–5.3)
Sodium: 140 mEq/L (ref 135–145)
Total Bilirubin: 0.8 mg/dL (ref 0.3–1.2)
Total Protein: 7.8 g/dL (ref 6.0–8.3)

## 2010-10-25 NOTE — Progress Notes (Signed)
  Subjective:    Patient ID: Jodi [name], female    DOB: 22-Feb-1932, 77 y.o.   MRN: [id number]  HPI  Ears Started draining last week has improved but would like them checked.  Not on any medications.  No pain or fever  HYPERTENSION Disease Monitoring: Blood pressure range-not checking Chest pain, palpitations- no      Dyspnea- no  Medications: Compliance- takes all meds as prescribed Lightheadedness,Syncope- no   Edema- no    Walks about 1 mile several times a week.  Can walk 2 blocks then rests then starts again.  No recent change in exercise tolerance  CHF No orthopnea or PND.  Takes demadex 3 x per week and this has been stable.     HYPERLIPIDEMIA Disease Monitoring: See symptoms for Hypertension  Medications: Compliance- daily crestor and fish oil and zetia Right upper quadrant pain- no  Muscle aches- no   ROS See HPI above   PMH Smoking Status noted      Review of Systems     Objective:   Physical Exam  Heart - Regular rate and rhythm. Gr [id number] systolic murmur RUSB no gallops or rubs.    Lungs:  Normal respiratory effort, chest expands symmetrically. Lungs are clear to auscultation, no crackles or wheezes. Extremities:  No cyanosis, edema, or deformity noted with good range of motion of all major joints.   Ears:  External ear exam shows no significant lesions or deformities.  Otoscopic examination reveals clear canals, tympanic membranes R has chronic perforation without drainage.  L has dry wax       Assessment & Plan:

## 2010-10-25 NOTE — Patient Instructions (Signed)
Every thing looks very good  Stop your Zetia and come in fasting in 3 months and we will recheck your labs  I will call you if your tests are not good.  Otherwise I will send you a letter.  If you do not hear from me with in 2 weeks please call our office.     Call if your ears start draining again

## 2010-10-25 NOTE — Assessment & Plan Note (Signed)
Well controlled.  She is concerned about Zetia causing liver damage and since it has no proven efficacy will stop and recheck her labs in 3 months

## 2010-10-25 NOTE — Assessment & Plan Note (Signed)
Well controlled. No changes. 

## 2010-10-25 NOTE — Assessment & Plan Note (Signed)
Stable symptoms and medications.  Exercising regularly.  Continue all meds

## 2010-10-26 ENCOUNTER — Encounter: Payer: Self-pay | Admitting: Family Medicine

## 2010-11-07 ENCOUNTER — Other Ambulatory Visit: Payer: Self-pay | Admitting: Family Medicine

## 2010-11-07 NOTE — Telephone Encounter (Signed)
Refill rquest

## 2010-11-07 NOTE — Telephone Encounter (Signed)
Refill request

## 2010-11-27 ENCOUNTER — Emergency Department (HOSPITAL_COMMUNITY): Payer: PRIVATE HEALTH INSURANCE

## 2010-11-27 ENCOUNTER — Inpatient Hospital Stay (HOSPITAL_COMMUNITY)
Admission: EM | Admit: 2010-11-27 | Discharge: 2010-11-29 | DRG: 815 | Disposition: A | Discharge status: Home / Self Care | Payer: PRIVATE HEALTH INSURANCE | Attending: Family Medicine | Admitting: Family Medicine

## 2010-11-27 DIAGNOSIS — Z7982 Long term (current) use of aspirin: Secondary | ICD-10-CM

## 2010-11-27 DIAGNOSIS — M1990 Unspecified osteoarthritis, unspecified site: Secondary | ICD-10-CM | POA: Diagnosis present

## 2010-11-27 DIAGNOSIS — I2510 Atherosclerotic heart disease of native coronary artery without angina pectoris: Secondary | ICD-10-CM | POA: Diagnosis present

## 2010-11-27 DIAGNOSIS — N183 Chronic kidney disease, stage 3 unspecified: Secondary | ICD-10-CM | POA: Diagnosis present

## 2010-11-27 DIAGNOSIS — D7389 Other diseases of spleen: Secondary | ICD-10-CM

## 2010-11-27 DIAGNOSIS — G4700 Insomnia, unspecified: Secondary | ICD-10-CM | POA: Diagnosis present

## 2010-11-27 DIAGNOSIS — I509 Heart failure, unspecified: Secondary | ICD-10-CM | POA: Diagnosis present

## 2010-11-27 DIAGNOSIS — I5022 Chronic systolic (congestive) heart failure: Secondary | ICD-10-CM | POA: Diagnosis present

## 2010-11-27 DIAGNOSIS — K5900 Constipation, unspecified: Secondary | ICD-10-CM | POA: Diagnosis present

## 2010-11-27 DIAGNOSIS — N289 Disorder of kidney and ureter, unspecified: Secondary | ICD-10-CM | POA: Diagnosis present

## 2010-11-27 DIAGNOSIS — Z951 Presence of aortocoronary bypass graft: Secondary | ICD-10-CM

## 2010-11-27 DIAGNOSIS — H6690 Otitis media, unspecified, unspecified ear: Secondary | ICD-10-CM | POA: Diagnosis present

## 2010-11-27 DIAGNOSIS — Z79899 Other long term (current) drug therapy: Secondary | ICD-10-CM

## 2010-11-27 DIAGNOSIS — E785 Hyperlipidemia, unspecified: Secondary | ICD-10-CM | POA: Diagnosis present

## 2010-11-27 DIAGNOSIS — I129 Hypertensive chronic kidney disease with stage 1 through stage 4 chronic kidney disease, or unspecified chronic kidney disease: Secondary | ICD-10-CM | POA: Diagnosis present

## 2010-11-27 DIAGNOSIS — I517 Cardiomegaly: Secondary | ICD-10-CM | POA: Diagnosis present

## 2010-11-27 LAB — URINALYSIS, ROUTINE W REFLEX MICROSCOPIC
Bilirubin Urine: NEGATIVE
Glucose, UA: NEGATIVE mg/dL
Hgb urine dipstick: NEGATIVE
Ketones, ur: NEGATIVE mg/dL
Nitrite: NEGATIVE
Protein, ur: NEGATIVE mg/dL
Specific Gravity, Urine: 1.023 (ref 1.005–1.030)
Urobilinogen, UA: 0.2 mg/dL (ref 0.0–1.0)
pH: 6 (ref 5.0–8.0)

## 2010-11-27 LAB — COMPREHENSIVE METABOLIC PANEL
ALT: 11 U/L (ref 0–35)
AST: 15 U/L (ref 0–37)
Albumin: 3.6 g/dL (ref 3.5–5.2)
Alkaline Phosphatase: 66 U/L (ref 39–117)
BUN: 29 mg/dL — ABNORMAL HIGH (ref 6–23)
CO2: 26 mEq/L (ref 19–32)
Calcium: 10.5 mg/dL (ref 8.4–10.5)
Chloride: 99 mEq/L (ref 96–112)
Creatinine, Ser: 1.34 mg/dL — ABNORMAL HIGH (ref 0.50–1.10)
GFR calc Af Amer: 43 mL/min — ABNORMAL LOW (ref >90)
GFR calc non Af Amer: 37 mL/min — ABNORMAL LOW (ref >90)
Glucose, Bld: 107 mg/dL — ABNORMAL HIGH (ref 70–99)
Potassium: 4.3 mEq/L (ref 3.5–5.1)
Sodium: 137 mEq/L (ref 135–145)
Total Bilirubin: 1 mg/dL (ref 0.3–1.2)
Total Protein: 7.1 g/dL (ref 6.0–8.3)

## 2010-11-27 LAB — URINE MICROSCOPIC-ADD ON

## 2010-11-27 LAB — DIFFERENTIAL
Basophils Absolute: 0.1 10*3/uL (ref 0.0–0.1)
Basophils Relative: 0 % (ref 0–1)
Eosinophils Absolute: 0.2 10*3/uL (ref 0.0–0.7)
Eosinophils Relative: 1 % (ref 0–5)
Lymphocytes Relative: 11 % — ABNORMAL LOW (ref 12–46)
Lymphs Abs: 1.7 10*3/uL (ref 0.7–4.0)
Monocytes Absolute: 1.4 10*3/uL — ABNORMAL HIGH (ref 0.1–1.0)
Monocytes Relative: 9 % (ref 3–12)
Neutro Abs: 11.6 10*3/uL — ABNORMAL HIGH (ref 1.7–7.7)
Neutrophils Relative %: 78 % — ABNORMAL HIGH (ref 43–77)

## 2010-11-27 LAB — CBC
HCT: 32.4 % — ABNORMAL LOW (ref 36.0–46.0)
Hemoglobin: 11.3 g/dL — ABNORMAL LOW (ref 12.0–15.0)
MCH: 31.6 pg (ref 26.0–34.0)
MCHC: 34.9 g/dL (ref 30.0–36.0)
MCV: 90.5 fL (ref 78.0–100.0)
Platelets: 161 10*3/uL (ref 150–400)
RBC: 3.58 MIL/uL — ABNORMAL LOW (ref 3.87–5.11)
RDW: 13.1 % (ref 11.5–15.5)
WBC: 14.8 10*3/uL — ABNORMAL HIGH (ref 4.0–10.5)

## 2010-11-27 LAB — OCCULT BLOOD, POC DEVICE: Fecal Occult Bld: NEGATIVE

## 2010-11-27 MED ORDER — IOHEXOL 300 MG/ML  SOLN: 80.0000 mL | Freq: Once | INTRAMUSCULAR | Status: AC | PRN

## 2010-11-27 NOTE — H&P (Signed)
UPPER LEVEL ADDENDUM  I have seen and examined pt and agree with Dr. Ansel Bong note.  For details, please see excellent PGY-1 note.  S; Briefly, this is a 77 y.o. female who p/w left sided pain starting yesterday, getting to 10/10 yesterday and this morning, sharp/stabbing in nature that start suddenly while she was at rest/sitting around.  Better w/ vicodin to 6/10 but not completely relieved.  Nothing seems to make it worse. No nausea until during history/physical. No vomiting or diarrhea.  Pt notes stomach got hard/tight, not painful, yesterday.  Usually has BM q day, but has not had one today; last BM was yesterday morning and was normal. Felt like she needed to have one last night and took 3 stool softeners which have not helped her to go yet. + belching and flatus but no relief from thsi.  Has not eatten today b/c of stomach tightness but has been able to drink fluids. No known recent trauma, no fever/chills, no weight loss, no chest pain/SOB. Has been urinating well. No weakness/numbness/tingling. In ED, received zofran 4mg  x2 and dilaudid 1mg  x1    O: vitals: 98.2  154/51  53 18 98% RA Gen: NAD, moderately uncomfortable, + belching HEENT: MMM, no jaundice, EOMI, PERRLA, + dentures CV: regular rhythm, bradycardic in [id number], + 3/6 systolic machine-like murmur Pulm: clear, no wheezes or crackles [id number], minimal distension, + bowel sounds, nontender in abd; + tenderness over mid-axillary line on left side over lowest 2-3 ribs, no obvious deformity or bruising but significantly TTP, no true CVA tenderness [id number], no edema  Labs:  Results for orders placed during the hospital encounter of 11/27/10 (from the past 24 hour(s))  OCCULT BLOOD, POC DEVICE     Status: Normal   Collection Time   11/27/10  6:05 PM      Component Value Range   Fecal Occult Bld NEGATIVE    DIFFERENTIAL     Status: Abnormal   Collection Time   11/27/10  6:26 PM      Component Value Range   Neutrophils Relative  78 (*) 43 - 77 (%)   Neutro Abs 11.6 (*) 1.7 - 7.7 (K/uL)   Lymphocytes Relative 11 (*) 12 - 46 (%)   Lymphs Abs 1.7  0.7 - 4.0 (K/uL)   Monocytes Relative 9  3 - 12 (%)   Monocytes Absolute 1.4 (*) 0.1 - 1.0 (K/uL)   Eosinophils Relative 1  0 - 5 (%)   Eosinophils Absolute 0.2  0.0 - 0.7 (K/uL)   Basophils Relative 0  0 - 1 (%)   Basophils Absolute 0.1  0.0 - 0.1 (K/uL)  CBC     Status: Abnormal   Collection Time   11/27/10  6:26 PM      Component Value Range   WBC 14.8 (*) 4.0 - 10.5 (K/uL)   RBC 3.58 (*) 3.87 - 5.11 (MIL/uL)   Hemoglobin 11.3 (*) 12.0 - 15.0 (g/dL)   HCT 32.4 (*) 36.0 - 46.0 (%)   MCV 90.5  78.0 - 100.0 (fL)   MCH 31.6  26.0 - 34.0 (pg)   MCHC 34.9  30.0 - 36.0 (g/dL)   RDW 13.1  11.5 - 15.5 (%)   Platelets 161  150 - 400 (K/uL)  COMPREHENSIVE METABOLIC PANEL     Status: Abnormal   Collection Time   11/27/10  6:26 PM      Component Value Range   Sodium 137  135 - 145 (mEq/L)   Potassium 4.3  3.5 - 5.1 (mEq/L)   Chloride 99  96 - 112 (mEq/L)   CO2 26  19 - 32 (mEq/L)   Glucose, Bld 107 (*) 70 - 99 (mg/dL)   BUN 29 (*) 6 - 23 (mg/dL)   Creatinine, Ser 1.34 (*) 0.50 - 1.10 (mg/dL)   Calcium 10.5  8.4 - 10.5 (mg/dL)   Total Protein 7.1  6.0 - 8.3 (g/dL)   Albumin 3.6  3.5 - 5.2 (g/dL)   AST 15  0 - 37 (U/L)   ALT 11  0 - 35 (U/L)   Alkaline Phosphatase 66  39 - 117 (U/L)   Total Bilirubin 1.0  0.3 - 1.2 (mg/dL)   GFR calc non Af Amer 37 (*) >90 (mL/min)   GFR calc Af Amer 43 (*) >90 (mL/min)  URINALYSIS, ROUTINE W REFLEX MICROSCOPIC     Status: Abnormal   Collection Time   11/27/10  9:23 PM      Component Value Range   Color, Urine YELLOW  YELLOW    Appearance CLEAR  CLEAR    Specific Gravity, Urine 1.023  1.005 - 1.030    pH 6.0  5.0 - 8.0    Glucose, UA NEGATIVE  NEGATIVE (mg/dL)   Hgb urine dipstick NEGATIVE  NEGATIVE    Bilirubin Urine NEGATIVE  NEGATIVE    Ketones, ur NEGATIVE  NEGATIVE (mg/dL)   Protein, ur NEGATIVE  NEGATIVE (mg/dL)    Urobilinogen, UA 0.2  0.0 - 1.0 (mg/dL)   Nitrite NEGATIVE  NEGATIVE    Leukocytes, UA TRACE (*) NEGATIVE   URINE MICROSCOPIC-ADD ON     Status: Normal   Collection Time   11/27/10  9:23 PM      Component Value Range   Squamous Epithelial / LPF RARE  RARE    WBC, UA 0-2  <3 (WBC/hpf)   Bacteria, UA RARE  RARE    CT abd/pelvis:     1. A wedge shaped low attenuation lesion within the spleen, suspicious for infarct.   2.  Solid lesion involving upper pole of the right kidney, suspicious for renal cell carcinoma.   3.  Multiple gallstones without other evidence for acute cholecystitis.   4.  Moderate stool burden in the descending and rectosigmoid colon. No evidence for bowel obstruction.  No evidence for acute   diverticulitis.   5.  Marked cardiomegaly.   A/P: 77 yo WF p/w left side pain 1. Left side pain: unclear etiology at this time -could be from splenic infarct vs constipation vs MSK -will gently hydrate -miralax and colace for constipation, I would have low threshold for enema -zofran and morphine PRN nausea and pain, respectively -AM CXR (r/o PNA) since pt does have elevated CBC and pain near ribs -unlikely to be referred pain from PE w/ normal O2 sat, no tachycardia, no pleuritic associate, in very mobile/active person w/o LE edema or tenderness  2. ?Splenic infarct: Seen on CT, likely new but no previous comparison -could be caused by hypercoagulable state such as from malignancy (?renal cell carcinoma on CT)  3. AoCRF:Cr 1.34, baseline in 02/2006 was 1.23 so not greatly elevated at this time -pt received contrast so will likely have acute rise in Cr on AM labs so will hold nephrotoxic meds until after Cr reassessed -gently hydrate -?renal cell carcinoma on CT scan which may be contributing -consider FeNa for pre vs intra vs post renal cause of slight Cr rise; may just be 2/2 HTN  4. HTN:  BPs  slightly elevated in 0000000 systolic -will restart home ramipril and  coreg -hold torsemide and lisinopril due to #3  5. Sinus bradycardia: known medical issue -HR stable in the 50's. -monitor on tele  6. CHF: known EF <50% -gently hydrate as pt does not appear fluid overloaded but do not want to cause edema  7. FEN/GI: NS @ 100cc/he x4 hrs then 75cc/hr; heart healthy diet as tolerated  8. Ppx: protonix 20 daily, SQ heparin  9. Dispo: admit tele and monitor overnight

## 2010-11-27 NOTE — H&P (Signed)
[facility] Admission History and Physical  Patient name: Jodi [name] Medical record number: [id number] Date of birth: Feb 20, 1932 Age: 77 y.o. Gender: female  Primary Care Provider: [name], MD, MD of [facility]  Chief Complaint: Left side pain  History of Present Illness: Jodi [name] is a 77 y.o. year old female with a history of CAD s/p CABG, HTN, HLD presenting with left sided abdominal pain. Patient says she had sudden onset of 10/10 left sided pain in the midaxillary line on Sunday 10/21 as well as stomach tightness. She says she thought it might be gas or constipation so she tried burping as well as 3 doses of an over the counter stool softener. She took some vicodin that she had for OA previously and this allowed her to sleep and reduced pain to 6/10.   On 10/22, the patient had recurrence of pain upon awakening (although it did not wake her from sleep). She did not have a bowel movement this morning when she typically has a bowel movement daily. She did not eat at all due to concern that she might make pain worse. Presented to the ED and was found to have a CT with concern of splenic infarct. She was admitted for pain control and monitoring.   Past Medical History: . HYPERCHOLESTEROLEMIA  . OTITIS MEDIA, CHRONIC  . HYPERTENSION, BENIGN SYSTEMIC  . CORONARY, ARTERIOSCLEROSIS  . CHF - EJECTION FRACTION < 50%  . RHINITIS  . SEBORRHEA  . OSTEOARTHRITIS, MULTI SITES  . INSOMNIA    Past Surgical History: Past Surgical History  Procedure Date  . Radical hysterectomy   . Coronary artery bypass graft 1995 and 2003    Social History: widowed. Retired. Never smoker, no alcohol or drug use. Lives alone in apartment and has 2 children that live in [place]. Patient independent and able to care for herself.   Family History: Family History  Problem Relation Age of Onset  . Coronary artery disease Mother   .  Coronary artery disease Brother   . Stroke Mother   Father and sister with leukemia.  No history of renal cell carcinoma Patient also mentions possible myelodysplastic syndrome in sister.   Allergies: Allergies  Allergen Reactions  . Sulfamethoxazole W/Trimethoprim Other (See Comments)    REACTION: shaking /chills     Current Outpatient Prescriptions  Medication Sig Dispense Refill  . Ascorbic Acid (VITAMIN C) 500 MG tablet Take 500 mg by mouth daily.        Marland Kitchen aspirin 325 MG tablet Take 325 mg by mouth daily.        . Calcium Carbonate-Vitamin D (CALCARB 600/D) 600-400 MG-UNIT per tablet Take 2 tablets by mouth daily.        . carvedilol (COREG) 25 MG tablet TAKE 1 TABLET BY MOUTH TWICE DAILY WITH A MEAL  60 tablet  11  . Cholecalciferol (VITAMIN D) 2000 UNITS CAPS Take 1 capsule by mouth 1 day or 1 dose.        Marland Kitchen CRESTOR 20 MG tablet TAKE 1 TABLET BY MOUTH DAILY  30 tablet  11  . fish oil-omega-3 fatty acids 1000 MG capsule Take 2 g by mouth daily.        Marland Kitchen HYDROcodone-acetaminophen (VICODIN) 5-500 MG per tablet TAKE 1 TABLET BY MOUTH TWICE DAILY AS NEEDED FOR SHOULDER PAIN  30 tablet  2  . Multiple Vitamin (MULTIVITAMIN) capsule Take 1 capsule by mouth daily.        Marland Kitchen  ramipril (ALTACE) 10 MG capsule Take 1 capsule (10 mg total) by mouth daily.  90 capsule  3  . spironolactone (ALDACTONE) 25 MG tablet TAKE 1 TABLET BY MOUTH DAILY  30 tablet  6  . torsemide (DEMADEX) 20 MG tablet Take 1 tablet (20 mg total) by mouth every other day. MWF  30 tablet  6  . vitamin E 400 UNIT capsule Take 400 Units by mouth daily.        Marland Kitchen zolpidem (AMBIEN) 5 MG tablet TAKE ONE TABLET BY MOUTH OCCASIONALLY AT BEDTIME  30 tablet  1   Review Of Systems: Per HPI, specifically no urinary symptoms Otherwise 12 point review of systems was performed and was unremarkable.  Physical Exam: Pulse: 53-55  Blood Pressure: 154-174/51-76 RR: 18-20   O2: 96-98 on RA Temp: 98.2  General: alert, cooperative, appears  stated age and mild distress HEENT: PERRLA, extra ocular movement intact and oropharynx clear, no lesions Heart: 3/6 SEM is heard throughout the precordium. Bradycardia noted. Regular rhythm Lungs: unlabored breathing, mild bibasilar crackles Abdomen: moderate tenderness in the left midaxillary line from 5cm above ribs to 3 cm below ribs., no guarding or rigidity, no CVA tenderness Extremities: extremities normal, atraumatic, no cyanosis or edema Neurology: normal without focal findings and mental status, speech normal, alert and oriented x3  Labs and Imaging: Results for orders placed during the hospital encounter of 11/27/10 (from the past 24 hour(s))  OCCULT BLOOD, POC DEVICE     Status: Normal   Collection Time   11/27/10  6:05 PM      Component Value Range   Fecal Occult Bld NEGATIVE    DIFFERENTIAL     Status: Abnormal   Collection Time   11/27/10  6:26 PM      Component Value Range   Neutrophils Relative 78 (*) 43 - 77 (%)   Neutro Abs 11.6 (*) 1.7 - 7.7 (K/uL)   Lymphocytes Relative 11 (*) 12 - 46 (%)   Lymphs Abs 1.7  0.7 - 4.0 (K/uL)   Monocytes Relative 9  3 - 12 (%)   Monocytes Absolute 1.4 (*) 0.1 - 1.0 (K/uL)   Eosinophils Relative 1  0 - 5 (%)   Eosinophils Absolute 0.2  0.0 - 0.7 (K/uL)   Basophils Relative 0  0 - 1 (%)   Basophils Absolute 0.1  0.0 - 0.1 (K/uL)  CBC     Status: Abnormal   Collection Time   11/27/10  6:26 PM      Component Value Range   WBC 14.8 (*) 4.0 - 10.5 (K/uL)   RBC 3.58 (*) 3.87 - 5.11 (MIL/uL)   Hemoglobin 11.3 (*) 12.0 - 15.0 (g/dL)   HCT 32.4 (*) 36.0 - 46.0 (%)   MCV 90.5  78.0 - 100.0 (fL)   MCH 31.6  26.0 - 34.0 (pg)   MCHC 34.9  30.0 - 36.0 (g/dL)   RDW 13.1  11.5 - 15.5 (%)   Platelets 161  150 - 400 (K/uL)  COMPREHENSIVE METABOLIC PANEL     Status: Abnormal   Collection Time   11/27/10  6:26 PM      Component Value Range   Sodium 137  135 - 145 (mEq/L)   Potassium 4.3  3.5 - 5.1 (mEq/L)   Chloride 99  96 - 112 (mEq/L)     CO2 26  19 - 32 (mEq/L)   Glucose, Bld 107 (*) 70 - 99 (mg/dL)   BUN 29 (*) 6 - 23 (  mg/dL)   Creatinine, Ser 1.34 (*) 0.50 - 1.10 (mg/dL)   Calcium 10.5  8.4 - 10.5 (mg/dL)   Total Protein 7.1  6.0 - 8.3 (g/dL)   Albumin 3.6  3.5 - 5.2 (g/dL)   AST 15  0 - 37 (U/L)   ALT 11  0 - 35 (U/L)   Alkaline Phosphatase 66  39 - 117 (U/L)   Total Bilirubin 1.0  0.3 - 1.2 (mg/dL)   GFR calc non Af Amer 37 (*) >90 (mL/min)   GFR calc Af Amer 43 (*) >90 (mL/min)  URINALYSIS, ROUTINE W REFLEX MICROSCOPIC     Status: Abnormal   Collection Time   11/27/10  9:23 PM      Component Value Range   Color, Urine YELLOW  YELLOW    Appearance CLEAR  CLEAR    Specific Gravity, Urine 1.023  1.005 - 1.030    pH 6.0  5.0 - 8.0    Glucose, UA NEGATIVE  NEGATIVE (mg/dL)   Hgb urine dipstick NEGATIVE  NEGATIVE    Bilirubin Urine NEGATIVE  NEGATIVE    Ketones, ur NEGATIVE  NEGATIVE (mg/dL)   Protein, ur NEGATIVE  NEGATIVE (mg/dL)   Urobilinogen, UA 0.2  0.0 - 1.0 (mg/dL)   Nitrite NEGATIVE  NEGATIVE    Leukocytes, UA TRACE (*) NEGATIVE   URINE MICROSCOPIC-ADD ON     Status: Normal   Collection Time   11/27/10  9:23 PM      Component Value Range   Squamous Epithelial / LPF RARE  RARE    WBC, UA 0-2  <3 (WBC/hpf)   Bacteria, UA RARE  RARE    Baseline Cr appears in 1.2-1.5 range  EKG-sinus bradycardia  Abdominal CT (10/22)  1. A wedge shaped low attenuation lesion within the spleen, suspicious for infarct.   2.  Solid lesion involving upper pole of the right kidney, suspicious for renal cell carcinoma.   3.  Multiple gallstones without other evidence for acute  cholecystitis.   4.  Moderate stool burden in the descending and rectosigmoid colon.   No evidence for bowel obstruction.  No evidence for acute  diverticulitis.   5.  Marked cardiomegaly. 6.  small, hypervascular liver lesion which measures 1.5 cm in diameter.  The   lesion is not imaged on delayed portion of the exam.  Differential    diagnosis includes hemangioma, FNH, or adenoma.  Assessment and Plan: Jodi [name] is a 77 y.o. year old female with a history of CAD s/p CABG, HTN, HLD presenting with left sided abdominal pain found to have likely a splenic infarct on CT.  1. Splenic Infarct/abdominal pain-will monitor overnight on telemetry. Believe this is most likely cause of abdominal pain. Unknown cause Potential considerations include hypercoaguability due to a malignancy (such as renal cell carcinoma), inherited clotting disorder (no family or personal hx), atrial fib (patient in sinus rhythm and no history), trauma (no history), vasculitis (do not currently suspect) -will likely pursue outpatient workup for malignancy such as renal cell carcinoma vs. Discuss biopsy with IR (if do so will need to obtain PT/INR/PTT) -will give morphine 2mg  q3 hours overnight. Will monitor pain and switch to PO in AM if possible.  -cholelithiasis does not appear to be a part of pain, constipation may be contributing -do not suspect pancreatitis as patient with little nausea vomiting and pain not typical in distribution 2. Constipation-may be contributing to pain. Will treat with miralax BID until has BM then once daily  as well as colace 3. CKD Stage III likely secondary to hypertension-patient appears to be at baseline of 1.2-1.5. Patient did receive IV contrast for CT so will need to monitor AM BMET.  4. Systolic CHF with EF [id number] Ramipril. Pending med reconcilliation for spironolactone and torsemide although will likely restart these medications if Cr does not elevate in AM.  5. HLD-continue on statin per med rec.  6. HTN-will continue ramipril and carvedilol. Patient will likely need spironolactone restarted as well 7. cad s/p cabg-no active chest pain. Will monitor on telemetry. ASA 325 8. Liver lesion 1.5cm-will consider outpatient monitoring.   FEN/GI: due to decreased PO will give IVF 100 hr. Heart healthy diet as tolerated.  Zofran for nausea.  Prophylaxis: Heparin sq. PPI. OOB with assist Disposition: pending further monitoring and conversion to PO pain control. Also must consider inpatient vs outpatient workup of potential renal cell carcinoma.    Garret Reddish, MD PGY1   [phone] Dictation [phone]

## 2010-11-28 ENCOUNTER — Inpatient Hospital Stay (HOSPITAL_COMMUNITY): Payer: PRIVATE HEALTH INSURANCE

## 2010-11-28 LAB — CBC
HCT: 32.2 % — ABNORMAL LOW (ref 36.0–46.0)
Hemoglobin: 11 g/dL — ABNORMAL LOW (ref 12.0–15.0)
MCH: 31.1 pg (ref 26.0–34.0)
MCHC: 34.2 g/dL (ref 30.0–36.0)
MCV: 91 fL (ref 78.0–100.0)
Platelets: 163 10*3/uL (ref 150–400)
RBC: 3.54 MIL/uL — ABNORMAL LOW (ref 3.87–5.11)
RDW: 13 % (ref 11.5–15.5)
WBC: 13.3 10*3/uL — ABNORMAL HIGH (ref 4.0–10.5)

## 2010-11-28 LAB — COMPREHENSIVE METABOLIC PANEL
ALT: 11 U/L (ref 0–35)
AST: 16 U/L (ref 0–37)
Albumin: 3.5 g/dL (ref 3.5–5.2)
Alkaline Phosphatase: 67 U/L (ref 39–117)
BUN: 24 mg/dL — ABNORMAL HIGH (ref 6–23)
CO2: 27 mEq/L (ref 19–32)
Calcium: 9.4 mg/dL (ref 8.4–10.5)
Chloride: 99 mEq/L (ref 96–112)
Creatinine, Ser: 1.18 mg/dL — ABNORMAL HIGH (ref 0.50–1.10)
GFR calc Af Amer: 51 mL/min — ABNORMAL LOW (ref >90)
GFR calc non Af Amer: 44 mL/min — ABNORMAL LOW (ref >90)
Glucose, Bld: 119 mg/dL — ABNORMAL HIGH (ref 70–99)
Potassium: 4.2 mEq/L (ref 3.5–5.1)
Sodium: 137 mEq/L (ref 135–145)
Total Bilirubin: 0.9 mg/dL (ref 0.3–1.2)
Total Protein: 7 g/dL (ref 6.0–8.3)

## 2010-11-28 LAB — URINE CULTURE
Colony Count: NO GROWTH
Culture  Setup Time: 201210222227
Culture: NO GROWTH

## 2010-11-29 DIAGNOSIS — I359 Nonrheumatic aortic valve disorder, unspecified: Secondary | ICD-10-CM

## 2010-11-29 LAB — BASIC METABOLIC PANEL
BUN: 24 mg/dL — ABNORMAL HIGH (ref 6–23)
CO2: 28 mEq/L (ref 19–32)
Calcium: 9 mg/dL (ref 8.4–10.5)
Chloride: 102 mEq/L (ref 96–112)
Creatinine, Ser: 1.23 mg/dL — ABNORMAL HIGH (ref 0.50–1.10)
GFR calc Af Amer: 48 mL/min — ABNORMAL LOW (ref >90)
GFR calc non Af Amer: 42 mL/min — ABNORMAL LOW (ref >90)
Glucose, Bld: 149 mg/dL — ABNORMAL HIGH (ref 70–99)
Potassium: 4.9 mEq/L (ref 3.5–5.1)
Sodium: 136 mEq/L (ref 135–145)

## 2010-11-29 LAB — CBC
HCT: 31.7 % — ABNORMAL LOW (ref 36.0–46.0)
Hemoglobin: 10.5 g/dL — ABNORMAL LOW (ref 12.0–15.0)
MCH: 30.4 pg (ref 26.0–34.0)
MCHC: 33.1 g/dL (ref 30.0–36.0)
MCV: 91.9 fL (ref 78.0–100.0)
Platelets: 162 10*3/uL (ref 150–400)
RBC: 3.45 MIL/uL — ABNORMAL LOW (ref 3.87–5.11)
RDW: 13.3 % (ref 11.5–15.5)
WBC: 13.6 10*3/uL — ABNORMAL HIGH (ref 4.0–10.5)

## 2010-11-29 LAB — HEMOGLOBIN A1C
Hgb A1c MFr Bld: 6.4 % — ABNORMAL HIGH (ref <5.7)
Mean Plasma Glucose: 137 mg/dL — ABNORMAL HIGH (ref <117)

## 2010-11-29 NOTE — Discharge Summary (Signed)
Physician Discharge Summary  Patient ID: Jodi [name] MRN: [id number] DOB/AGE: 10/07/1932 77 y.o.  Admit date: 11/27/2010 Discharge date: 11/29/2010  PCP: Talbert Cage, MD of [place]  Cardiology follow up: [name], MD  Discharge Diagnosis: Primary 1. Splenic Infarct 2. Right kidney lesion concerning for renal cell carcinoma 3. Constipation Secondary 1. Hyperlipidemia.   2. Chronic otitis media.   3. Hypertension.   4. CAD status post CABG.   5. CHF with ejection fraction less than [id number], systolic.   6. Osteoarthritis of multiple sites.   7. Insomnia.    Hospital Course Jodi [name] is a 77 y.o. year old female with a history of CAD s/p CABG, HTN, HLD presenting with left sided abdominal pain found to have likely a splenic infarct on CT.  1. Splenic Infarct/abdominal pain  -cxr and rib film unrevealing of source of pain  -no source of potential emboli found on 2-d echo so no plan to anticoagulate -potentially patient in hypercoagulable state due to malignancy (concerning lesion on CT for renal cell carcinoma), which will require outpatient urology follow up. History of trauma to left side but nearly 3 weeks ago.  -pain controlled initially with vicodin, but did require occasional IV morphine for breakthrough pain. Sent home with Vicodin q4 hours and told to schedule use for 2-3 days then space out.  2. Aortic Stenosis-patient noted to have harsh 3/6 systolic ejection murmur radiating to carotids. Previous echo in 2003 showed mild aortic stenosis. Repeat echo here showed moderately severe aortic stenosis. Scheduled patient follow up appointment with Dr. Einar Gip as she no longer has a cardiologist.  3. Constipation-thought to be contributiong to pain. Given Miralax BID and Senokot and patient with Bowel movement on day of discharge.   3. CKD Stage III likely secondary to hypertension-patient appears to be at baseline of 1.2-1.5. Stable here.  4.  Systolic CHF with EF [id number] in  2003 now 35-40%-on Ramipril, spironolactone and torsemide. Will follow up with cardiology 5. HLD-continue statin 6. HTN-will continue ramipril and carvedilol and spironalactone  7. cad s/p cabg-no active chest pain. f/u with Dr. Einar Gip  8. Liver lesion 1.5cm-will consider outpatient monitoring.  9. Solid lesion upper pole right kidney-WIll need outpatient urology follow up for potential renal cell carcinoma.     Procedures/Imaging:  1. CT abdomen and pelvis-1. A wedge shaped low attenuation lesion within the spleen,   suspicious for infarct. 2.  Solid lesion involving upper pole of the right kidney,   suspicious for renal cell carcinoma. 3.  Multiple gallstones without other evidence for acute   Cholecystitis.  4.  Moderate stool burden in the descending and rectosigmoid colon.   No evidence for bowel obstruction.  No evidence for acute   diverticulitis. 5.  Marked cardiomegaly. 6.  Within the dome of the left hepatic lobe, there is a small,   hypervascular liver lesion which measures 1.5 cm in diameter.  The   lesion is not imaged on delayed portion of the exam.  Differential   diagnosis includes hemangioma, FNH, or adenoma.  Metastasis is also   a consideration but felt to be less likely. 2. CXR-Continued enlargement of the cardiac silhouette. Mild vascular  congestion pattern with mild prominence of fissures which may  reflect subpleural edema or small amount of fluid. No  costophrenic angle blunting pleural effusion is evident. Post CABG.  Chronic changes. Question slight compression of a  lower mid thoracic vertebral body which is probably the body of  T8.  3. L rib x-ray -No rib fracture, pneumothorax, or pleural effusion is identified.  Osteopenic appearance of bones.  4. Echo - Left ventricle: Hypokinesis of the inferolateral wall and     the inferior wall. Septal dysynergy. The cavity size was     normal. Wall thickness was increased in a pattern  of     moderate LVH. Systolic function was moderately reduced.     The estimated ejection fraction was in the range of 35% to     40%. Doppler parameters are consistent with restrictive     physiology, indicative of decreased left ventricular     diastolic compliance and/or increased left atrial     pressure. Doppler parameters are consistent with high     ventricular filling pressure.   - Aortic valve: Calcified leaflets. Moderately severe AS.     Mean gradient: 8mm Hg (S). Peak gradient: 32mm Hg (S).     Valve area: 1.04cm^2(VTI). Valve area: 0.9cm^2 (Vmax).   - Mitral valve: Calcified annulus. Mild regurgitation.   - Left atrium: The atrium was mildly dilated.   - Right atrium: The atrium was mildly dilated.    Labs  Results for orders placed during the hospital encounter of 11/27/10 (from the past 24 hour(s))  CBC     Status: Abnormal   Collection Time   11/29/10  3:34 AM      Component Value Range   WBC 13.6 (*) 4.0 - 10.5 (K/uL)   RBC 3.45 (*) 3.87 - 5.11 (MIL/uL)   Hemoglobin 10.5 (*) 12.0 - 15.0 (g/dL)   HCT 31.7 (*) 36.0 - 46.0 (%)   MCV 91.9  78.0 - 100.0 (fL)   MCH 30.4  26.0 - 34.0 (pg)   MCHC 33.1  30.0 - 36.0 (g/dL)   RDW 13.3  11.5 - 15.5 (%)   Platelets 162  150 - 400 (K/uL)  BASIC METABOLIC PANEL     Status: Abnormal   Collection Time   11/29/10  3:34 AM      Component Value Range   Sodium 136  135 - 145 (mEq/L)   Potassium 4.9  3.5 - 5.1 (mEq/L)   Chloride 102  96 - 112 (mEq/L)   CO2 28  19 - 32 (mEq/L)   Glucose, Bld 149 (*) 70 - 99 (mg/dL)   BUN 24 (*) 6 - 23 (mg/dL)   Creatinine, Ser 1.23 (*) 0.50 - 1.10 (mg/dL)   Calcium 9.0  8.4 - 10.5 (mg/dL)   GFR calc non Af Amer 42 (*) >90 (mL/min)   GFR calc Af Amer 48 (*) >90 (mL/min)  HEMOGLOBIN A1C     Status: Abnormal   Collection Time   11/29/10  3:34 AM      Component Value Range   Hemoglobin A1C 6.4 (*) <5.7 (%)   Mean Plasma Glucose 137 (*) <117 (mg/dL)      Patient condition at time of  discharge/disposition: stable  Disposition-home   Follow up issues: 1. Patient will need outpatient urology follow up for right kidney lesion with concern for potential renal cell carcinoma.  2. Will need outpatient follow up for 1.5cm lesion on liver. Consider monitoring.  3. Please follow up patient pain. Pain to be expected for 4-6 weeks with spenic infarct.  4. Please follow up recommendations from cardiology once patient seen.   Discharge follow up:  Discharge Orders    Future Appointments: Provider: Department: Dept Phone: Center:   12/06/2010 10:15 AM [name], MD  [facility] [phone] [facility]    -Patient to f/u with Dr. Einar Gip of [facility] on 11/5 at 12:30pm  Current Discharge Medication List    CONTINUE these medications which have NOT CHANGED   Details  aspirin 325 MG tablet Take 325 mg by mouth daily.      carvedilol (COREG) 25 MG tablet TAKE 1 TABLET BY MOUTH TWICE DAILY WITH A MEAL Qty: 60 tablet, Refills: 11    CRESTOR 20 MG tablet TAKE 1 TABLET BY MOUTH DAILY Qty: 30 tablet, Refills: 11    fish oil-omega-3 fatty acids 1000 MG capsule Take 2 g by mouth daily.      HYDROcodone-acetaminophen (VICODIN) 5-325 MG per tablet Take q4hours for pain. Take scheduled for first 2-3 days.     Multiple Vitamin (MULTIVITAMIN) capsule Take 1 capsule by mouth daily.      ramipril (ALTACE) 10 MG capsule Take 1 capsule (10 mg total) by mouth daily. Qty: 90 capsule, Refills: 3    spironolactone (ALDACTONE) 25 MG tablet TAKE 1 TABLET BY MOUTH DAILY Qty: 30 tablet, Refills: 6    torsemide (DEMADEX) 20 MG tablet Take 1 tablet (20 mg total) by mouth every other day MWF Qty: 30 tablet, Refills: 6    vitamin E 400 UNIT capsule Take 400 Units by mouth daily.      zofran 4mg  PO q6h prn nausea Miralax 17g by mouth daily hold for loose stools Biotin otc daily Calcium 600 mg BID Vitamin C OTC 1 tablet daily Vitamin d3 200 units 1 tablet by mouth daily        Signed: Garret Reddish 11/29/2010, 1:37 AM Dictation # [id number], [phone]

## 2010-11-29 NOTE — H&P (Signed)
NAMEMAJESTY, THIND NO.:  [id number]  MEDICAL RECORD NO.:  [id number]  LOCATION:  [facility]                         FACILITY:  [facility]  PHYSICIAN:  Blane Ohara Imara Standiford, M.D.DATE OF BIRTH:  01-Nov-1932  DATE OF ADMISSION:  11/27/2010 DATE OF DISCHARGE:                             HISTORY & PHYSICAL   PRIMARY CARE PROVIDER:  Talbert Cage, MD at [facility].  CHIEF COMPLAINT:  Left side pain.  HISTORY OF PRESENT ILLNESS:  Jodi [name] is a 77 year old female with history of CAD status post CABG, hypertension, hyperlipidemia, presenting with left-sided abdominal pain.  The patient says she had a sudden onset of 10/10 left-sided pain in the mid axillary line on Sunday, November 26, 2010, as well as some tightness.  She says she thought it might be gas or constipation, so she tried burping as well as 3 doses of an over-the-counter stool softener.  She took some Vicodin that she had for osteoarthritis previously and this allowed her to sleep and reduced pain to 6/10.  On November 27, 2010, the patient had recurrence of pain upon awakening although it did not wake her from sleep.  She did not have a bowel movement this morning, when she typically has a bowel movement daily. She did not eat at all due to concern that she might make the pain worse.  She presented to the ED and was found to have a CT with concern for splenic infarct.  She was admitted for pain control and monitoring.  PAST MEDICAL HISTORY: 1. Hyperlipidemia. 2. Chronic otitis media. 3. Hypertension. 4. CAD status post CABG. 5. CHF with ejection fraction less than [id number], systolic. 6. Seborrhea. 7. Osteoarthritis of multiple sites. 8. Insomnia.  PAST SURGICAL HISTORY: 1. Radical hysterectomy. 2. Coronary artery bypass grafting in 1995 and 2003.  SOCIAL HISTORY:  Widowed.  Retired.  Never smoker.  No alcohol or drug use.  Lives alone in apartment and has 2 children, they  live  in [place].  The patient is independent and able to care for herself.  FAMILY HISTORY:  CAD in mother, CAD in brother, stroke in mother, father and sister with leukemia.  No history of renal cell carcinoma.  The patient also mentioned possible myelodysplastic syndrome in sister.  ALLERGIES:  BACTRIM, reaction shaking or chills.  OUTPATIENT MEDICATIONS: 1. Vitamin C 500 mg 1 by mouth daily. 2. Aspirin 325 mg 1 by mouth daily. 3. Calcium carbonate/vitamin D 600/400, take 2 tablets by mouth daily. 4. Carvedilol 25 mg, take 1 tablet by mouth b.i.d. 5. Cholecalciferol 2000-unit capsule, 1 capsule by mouth daily. 6. Crestor 20 mg 1 by mouth daily. 7. Fish oil - omega-3 fatty acids 1000 mg, take 2 g by mouth daily. 8. Vicodin 5/500, take 1 tablet by mouth twice daily as needed for     shoulder pain. 9. Daily multivitamin. 10.Ramipril 10 mg, take 1 capsule by mouth daily. 11.Spironolactone 25 mg, take 1 tablet by mouth daily. 12.Torsemide 20 mg, take 1 tablet by mouth every other day, Monday,     Wednesday, Friday. 13.Vitamin E 400 units, take 1 by mouth daily. 14.Ambien 5 mg, take 1 tablet by mouth occasionally at bedtime.  REVIEW  OF SYSTEMS:  Per HPI, specifically no urinary symptoms. Otherwise, 12-point review of systems was performed and was unremarkable.  PHYSICAL EXAMINATION:  VITAL SIGNS:  Pulse 50-55, blood pressure 154- 174/51-76, respiratory rate 18-20, sats 96-98% on room air, temperature 98.2, GENERAL:  Alert, cooperative, appears stated age, in mild distress. HEENT:  Pupils equal, round, and reactive to light and accommodation. Extraocular movements intact.  Oropharynx clear.  No lesions. HEART:  3/6 systolic ejection murmur heard through precordium. Bradycardia noted.  Regular rhythm LUNGS:  Unlabored breathing.  Mild bibasilar crackles. ABDOMEN:  Moderate tenderness in the left midaxillary line from 5 cm above ribs to 3 cm below ribs.  No guarding or rigidity.  No  CVA tenderness. EXTREMITIES:  Atraumatic.  No cyanosis or edema. NEUROLOGIC:  Normal without focal findings.  On mental status, speech normal.  Alert and orient x3.  LABS AND IMAGING: 1. Fecal occult blood test negative. 2. CBC with white count 14.8, hemoglobin 11.3, platelets 161.     Differential with neutrophils 78%. 3. CMET: 137, 4.3, 99, 26, 107, 29, 1.34, calcium 10.5, 7.1, 3.6, 15,     11, 66, 1.0. 4. Urinalysis unremarkable except for trace leukocytes. 5. Baseline creatinine appears in the 1.3-1.5 range. 6. EKG with sinus bradycardia. 7. Abdominal CT on November 27, 2010, showed;     a.     Wedge-shaped low attenuation lesion within the spleen,      suspicious for infarct.     b.     Solid lesion involving upper pole of the right kidney,      suspicious for renal cell carcinoma.     c.     Multiple gallstones without other evidence for acute      cholecystitis.     d.     Moderate stool burden in descending and rectosigmoid colon.      No evidence for bowel obstruction.  No evidence for acute      diverticulitis.     e.     Marked cardiomegaly.     f.     Small hypervascular liver lesion which measures 1.5 cm in      diameter.  The lesion is not imaged on delayed portion of the      exam.  Differential diagnosis includes hemangioma, FNH, or      adenoma.  ASSESSMENT AND PLAN:  Jodi [name] is a 77 y.o. year old female with a history of CAD s/p CABG, HTN, HLD presenting with left sided abdominal pain found to have likely a splenic infarct on CT.  1.   Splenic Infarct/abdominal pain-will monitor overnight on telemetry. Believe this is most likely cause of abdominal pain. Unknown cause Potential considerations include hypercoaguability due to a malignancy (such as renal cell carcinoma), inherited  clotting disorder (no family or personal hx), atrial fib (patient in sinus rhythm and no history), trauma (no history), vasculitis (do not currently suspect) -will likely pursue  outpatient workup for malignancy such as renal cell carcinoma vs. Discuss biopsy with IR (if do so will need to obtain PT/INR/PTT) -will give morphine 2mg  q3 hours overnight. Will monitor pain and switch to PO in AM if possible.   -cholelithiasis does not appear to be a part of pain, constipation may be contributing -do not suspect pancreatitis as patient with little nausea vomiting and pain not typical in distribution -will also get AM CXR given white count and location of pain for concern of possible PNA 2. Constipation-may be contributing to  pain. Will treat with miralax BID until has BM then once daily as well as colace 3. CKD Stage III likely secondary to hypertension-patient appears to be at baseline of 1.2-1.5. Patient did receive IV contrast for CT so will need to monitor AM BMET.   4. Systolic CHF with EF [id number] Ramipril. Pending med reconcilliation for spironolactone and torsemide although will likely restart these medications if Cr does not elevate in AM.   5. HLD-continue on statin per med rec.   6. HTN-will continue ramipril and carvedilol. Patient will likely need spironolactone restarted as well 7. cad s/p cabg-no active chest pain. Will monitor on telemetry. ASA 325 8. Liver lesion 1.5cm-will consider outpatient monitoring.    FEN/GI: due to decreased PO will give IVF 100 hr. Heart healthy diet as tolerated. Zofran for nausea.  Prophylaxis: Heparin sq. PPI. OOB with assist Disposition: pending further monitoring and conversion to PO pain control. Also must consider inpatient vs outpatient workup of potential renal cell carcinoma.      ______________________________ Garret Reddish, MD   ______________________________ Blane Ohara Patty Lopezgarcia, M.D.    SH/MEDQ  D:  11/28/2010  T:  11/28/2010  Job:  [id number]  Electronically Signed by Garret Reddish MD on 11/28/2010 04:46:38 AM Electronically Signed by Lissa Morales M.D. on 11/29/2010 08:05:54 AM

## 2010-11-29 NOTE — Progress Notes (Signed)
Family Medicine Teaching Service Intern Progress Note  Subjective:  O/N: Patient with worsening pain requiring 2 doses of morphine and a 1x dose of dilaudid (although pain had improved to 3/10 before dilaudid given). Patient also with some nausea last night.  BM-will need to discuss with patient once returns from echo Pain-sons in room report that mother very stoic and wont request medication until in severe pain  Objective:  T98.1  HR: 53-61 RR: 14-20 BP: 114-169/62-82  SaO2: 88-95 on RA-->2L *exam from midnight General: alert, cooperative, appears stated age and mild distress  HEENT: PERRLA, extra ocular movement intact and oropharynx clear, no lesions  Heart: 3/6 SEM is heard throughout the precordium. Bradycardia noted. Regular rhythm  Lungs: unlabored breathing, mild bibasilar crackles  Abdomen: moderate tenderness in the left midaxillary line pinpoint over ribs, no guarding or rigidity, no CVA tenderness  Extremities: extremities normal, atraumatic, no cyanosis or edema  Neurology: normal without focal findings and mental status, speech normal, alert and oriented x3   Labs and imaging:  Results for orders placed during the hospital encounter of 11/27/10 (from the past 24 hour(s))  CBC     Status: Abnormal   Collection Time   11/29/10  3:34 AM      Component Value Range   WBC 13.6 (*) 4.0 - 10.5 (K/uL)   RBC 3.45 (*) 3.87 - 5.11 (MIL/uL)   Hemoglobin 10.5 (*) 12.0 - 15.0 (g/dL)   HCT 31.7 (*) 36.0 - 46.0 (%)   MCV 91.9  78.0 - 100.0 (fL)   MCH 30.4  26.0 - 34.0 (pg)   MCHC 33.1  30.0 - 36.0 (g/dL)   RDW 13.3  11.5 - 15.5 (%)   Platelets 162  150 - 400 (K/uL)  BASIC METABOLIC PANEL     Status: Abnormal   Collection Time   11/29/10  3:34 AM      Component Value Range   Sodium 136  135 - 145 (mEq/L)   Potassium 4.9  3.5 - 5.1 (mEq/L)   Chloride 102  96 - 112 (mEq/L)   CO2 28  19 - 32 (mEq/L)   Glucose, Bld 149 (*) 70 - 99 (mg/dL)   BUN 24 (*) 6 - 23 (mg/dL)   Creatinine, Ser 1.23 (*) 0.50 - 1.10 (mg/dL)   Calcium 9.0  8.4 - 10.5 (mg/dL)   GFR calc non Af Amer 42 (*) >90 (mL/min)   GFR calc Af Amer 48 (*) >90 (mL/min)     1. CXR-Continued enlargement of the cardiac silhouette. Mild vascular   congestion pattern with mild prominence of fissures which may   reflect subpleural edema or small amount of fluid.  No   costophrenic angle blunting pleural effusion is evident. Post CABG.   Chronic changes.  Question slight compression of a   lower mid thoracic vertebral body which is probably the body of T8. 2. L rib x-ray -No rib fracture, pneumothorax, or pleural effusion is identified.   Osteopenic appearance of bones.    Assessment  Jodi [name] is a 77 y.o. year old female with a history of CAD s/p CABG, HTN, HLD presenting with left sided abdominal pain found to have likely a splenic infarct on CT.  1. Splenic Infarct/abdominal pain -f/u 2-d echo to evaluate for potential source of emboli -potentially patient could need anticoagulation -cxr and rib film unrevealing of source of pain  -vicodin for pain control q4 with morphine 2mg  q3 hours.  -Will switch to percocet scheduled q6 given  need for 1x dose of dilaudid-will continue prn morphine. Given poor po pain control, may need to monitor 1 more night especially since patient has not received echo yet  -Unknown cause Potential considerations include hypercoaguability due to a malignancy (such as renal cell carcinoma), inherited clotting disorder (no family or personal hx), atrial fib (patient in sinus rhythm and no history), trauma (no history), vasculitis (do not currently suspect)   2. Constipation-may be contributing to pain. Will treat with miralax BID until has BM then once daily as well as senokot 3. CKD Stage III likely secondary to hypertension-patient appears to be at baseline of 1.2-1.5. Stable in baseline range 4. Systolic CHF with EF [id number] Ramipril.will restart spironolactone  and torsemide. F/u echo 5. HLD-continue on statin  6. HTN-will continue ramipril and carvedilol. Restarting spironalactone today. Carvedilol has been held due to HR in 50s.  7. cad s/p cabg-no active chest pain. Will monitor on telemetry. ASA 325. Patient no longer has cardiology f/u. Would like to f/u with Dr. Einar Gip 8. Liver lesion 1.5cm-will consider outpatient monitoring.  9. Anemia-stable at 10.5 from 11.3 FEN/GI: due to decreased PO will give IVF 100 hr. Heart healthy diet as tolerated. Zofran for nausea.  Prophylaxis: Heparin sq. PPI. OOB with assist. OOBTC BID and ambulate BID.  Disposition: pending further monitoring and conversion to PO pain control. WIll need outpatient urology follow up for potential renal cell carcinoma.   Garret Reddish, MD PGY1, Family Medicine Teaching Service   [phone]

## 2010-12-01 ENCOUNTER — Telehealth: Payer: Self-pay | Admitting: Family Medicine

## 2010-12-01 MED ORDER — OXYCODONE HCL 5 MG/5ML PO SOLN: 5.0000 mg | ORAL | Status: AC | PRN

## 2010-12-01 NOTE — Telephone Encounter (Signed)
To Dr. Conrad [place], Salome Spotted

## 2010-12-01 NOTE — Telephone Encounter (Signed)
Daughter calling again and is asking that she get a response back concerned that her mother will not get her pain medications in enough time asking to be called back ASAP.forwarded to pcp.Audelia Hives [place]

## 2010-12-01 NOTE — Telephone Encounter (Signed)
Daughter is calling back and says her mother is getting anxious because she hasn't heard anything back yet.  The daughter is also anxious and asked if they would hears something back today.  I let her know that I could not promise that but that I would forward the message again.

## 2010-12-01 NOTE — Telephone Encounter (Signed)
I saw patient when she was in the hospital. Intended to send patient home on percocet as pain sometimes poorly controlled on vicodin. Patient improved drastically and was discharged home on Vicodin. Patient at home received relief with roxicodone 5/325  2 teaspoons from daughter after vicodin. Advised daughter to NOT give unprescribed medications to mother and advised danger that could have given mother tylenol overdose. Will give rx of oxycodone 5mg /21ml q2 hrs prn for pain not controlled by Vicodin. Advised pt to go to ED if not relieved by 2-3 doses of oxycodone. Also advised pain may persist for 4-6 weeks given splenic infarct.

## 2010-12-01 NOTE — Telephone Encounter (Signed)
Was DC'd from the hospital yesterday.  Her pain is not under control and she needs to speak to someone about this.  Patient is wanting to get to her own home but caregiver needs a call as soon as possible.

## 2010-12-06 ENCOUNTER — Ambulatory Visit (INDEPENDENT_AMBULATORY_CARE_PROVIDER_SITE_OTHER): Payer: PRIVATE HEALTH INSURANCE | Admitting: Family Medicine

## 2010-12-06 ENCOUNTER — Encounter: Payer: Self-pay | Admitting: Family Medicine

## 2010-12-06 VITALS — BP 156/69 | HR 51 | Temp 97.8°F | Ht 64.0 in | Wt 162.7 lb

## 2010-12-06 DIAGNOSIS — D735 Infarction of spleen: Secondary | ICD-10-CM

## 2010-12-06 DIAGNOSIS — N289 Disorder of kidney and ureter, unspecified: Secondary | ICD-10-CM

## 2010-12-06 DIAGNOSIS — I350 Nonrheumatic aortic (valve) stenosis: Secondary | ICD-10-CM | POA: Insufficient documentation

## 2010-12-06 DIAGNOSIS — N2889 Other specified disorders of kidney and ureter: Secondary | ICD-10-CM

## 2010-12-06 DIAGNOSIS — D7389 Other diseases of spleen: Secondary | ICD-10-CM

## 2010-12-06 DIAGNOSIS — I359 Nonrheumatic aortic valve disorder, unspecified: Secondary | ICD-10-CM

## 2010-12-06 HISTORY — DX: Other specified disorders of kidney and ureter: N28.89

## 2010-12-06 HISTORY — DX: Infarction of spleen: D73.5

## 2010-12-06 NOTE — Discharge Summary (Signed)
  [name], [name] NO.:  [id number]  MEDICAL RECORD NO.:  [id number]  LOCATION:  2003                         FACILITY:  [facility]  PHYSICIAN:  Blane Ohara Doniesha Landau, M.D.DATE OF BIRTH:  [birth year]  DATE OF ADMISSION:  12/04/2010 DATE OF DISCHARGE:  11/29/2010                              DISCHARGE SUMMARY   ADDENDUM  CC to couple of physicians.    ______________________________ Garret Reddish, MD   ______________________________ Blane Ohara Keiley Levey, M.D.    SH/MEDQ  D:  11/29/2010  T:  11/30/2010  Job:  [id number]  cc:   Talbert Cage, M.D. Laverda Page, MD  Electronically Signed by Garret Reddish MD on 12/04/2010 04:06:30 PM Electronically Signed by Lissa Morales M.D. on 12/06/2010 08:01:44 AM

## 2010-12-06 NOTE — Assessment & Plan Note (Signed)
Moderate to severe without angina or syncope.  Patient has appointment with Dr Einar Gip next week.  Continue current medications

## 2010-12-06 NOTE — Discharge Summary (Signed)
NAMEANELIESE, VARLAND NO.:  [id number]  MEDICAL RECORD NO.:  [id number]  LOCATION:  2003                         FACILITY:  [facility]  PHYSICIAN:  Blane Ohara Renley Banwart, M.D.DATE OF BIRTH:  1932/12/19  DATE OF ADMISSION:  11/27/2010 DATE OF DISCHARGE:  11/29/2010                              DISCHARGE SUMMARY   PRIMARY CARE PHYSICIAN:  Talbert Cage, MD, of [facility].  CARDIOLOGY:  Follow up with Dr. [name].  DISCHARGE DIAGNOSES:  Primary. 1. Splenic infarct. 2. Right kidney lesion concerning for renal cell carcinoma. 3. Constipation. 4. Moderate to severe Aortic Stenosis with reduced systolic function by Echocardiogram this admission  Secondary. 1. Hyperlipidemia. 2. Chronic otitis media. 3. Hypertension. 4. Coronary artery disease status post coronary artery bypass graft. 5. Congestive heart failure with ejection fraction of less than 40%. 6. Osteoarthritis of multiple sites. 7. Insomnia.  HOSPITAL COURSE:  Jodi [name] is a 77 y.o. year old female with a history of CAD s/p CABG, HTN, HLD presenting with left sided abdominal pain found to have likely a splenic infarct on CT.   1. Splenic Infarct/abdominal pain  -cxr and rib film unrevealing of source of pain   -no source of potential emboli found on 2-d echo so no plan to anticoagulate -potentially patient in hypercoagulable state due to malignancy (concerning lesion on CT for renal cell carcinoma), which will require outpatient urology follow up. History of trauma to left side but nearly 3 weeks ago.   -pain controlled initially with vicodin, but did require occasional IV morphine for breakthrough pain. Sent home with Vicodin q4 hours and told to schedule use for 2-3 days then space out.   2. Aortic Stenosis-patient noted to have harsh 3/6 systolic ejection murmur radiating to carotids. Previous echo in 2003 showed mild aortic stenosis. Repeat echo here showed moderately severe  aortic stenosis. Scheduled patient follow up appointment with  Dr. Einar Gip as she no longer has a cardiologist.   3. Constipation-thought to be contributiong to pain. Given Miralax BID and Senokot and patient with Bowel movement on day of discharge.    3. CKD Stage III likely secondary to hypertension-patient appears to be at baseline of 1.2-1.5. Stable here.  4. Systolic CHF with EF [id number] in  2003 now 35-40%-on Ramipril, spironolactone and torsemide. Will follow up with cardiology 5. HLD-continue statin 6. HTN-will continue ramipril and carvedilol and spironalactone   7. cad s/p cabg-no active chest pain. f/u with Dr. Einar Gip   8. Liver lesion 1.5cm-will consider outpatient monitoring.   9. Solid lesion upper pole right kidney-WIll need outpatient urology follow up for potential renal cell carcinoma.   PROCEDURES/IMAGING:   1. CT abdomen and pelvis-1. A wedge shaped low attenuation lesion within the spleen,   suspicious for infarct. 2.  Solid lesion involving upper pole of the right kidney,   suspicious for renal cell carcinoma. 3.  Multiple gallstones without other evidence for acute   Cholecystitis.  4.  Moderate stool burden in the descending and rectosigmoid colon.   No evidence for bowel obstruction.  No evidence for acute   diverticulitis. 5.  Marked cardiomegaly. 6.  Within the dome of the left hepatic lobe,  there is a small,   hypervascular liver lesion which measures 1.5 cm in diameter.  The   lesion is not imaged on delayed portion of the exam.  Differential   diagnosis includes hemangioma, FNH, or adenoma.  Metastasis is also   a consideration but felt to be less likely. 2. CXR-Continued enlargement of the cardiac silhouette. Mild vascular   congestion pattern with mild prominence of fissures which may   reflect subpleural edema or small amount of fluid. No   costophrenic angle blunting pleural effusion is evident. Post CABG.   Chronic changes. Question slight compression of a     lower mid thoracic vertebral body which is probably the body of T8.   3. L rib x-ray -No rib fracture, pneumothorax, or pleural effusion is identified.   Osteopenic appearance of bones.  4. Echo - Left ventricle: Hypokinesis of the inferolateral wall and     the inferior wall. Septal dysynergy. The cavity size was     normal. Wall thickness was increased in a pattern of     moderate LVH. Systolic function was moderately reduced.     The estimated ejection fraction was in the range of 35% to     40%. Doppler parameters are consistent with restrictive     physiology, indicative of decreased left ventricular     diastolic compliance and/or increased left atrial     pressure. Doppler parameters are consistent with high     ventricular filling pressure.   - Aortic valve: Calcified leaflets. Moderately severe AS.     Mean gradient: 76mm Hg (S). Peak gradient: 34mm Hg (S).     Valve area: 1.04cm^2(VTI). Valve area: 0.9cm^2 (Vmax).   - Mitral valve: Calcified annulus. Mild regurgitation.   - Left atrium: The atrium was mildly dilated.   - Right atrium: The atrium was mildly dilated.   LABORATORY FINDINGS: 1. CBC; white count 13.6, hemoglobin 10.5, platelets 162. 2. BMET; 136, 4.9, 102, 28, 149, 24, 1.239. 3. Hemoglobin A1c of 6.4.  Patient condition at the time of discharge is stable and able to be discharged home.  FOLLOWUP ISSUES: 1. Patient will need outpatient urology follow up for right kidney lesion with concern for potential renal cell carcinoma.   2. Will need outpatient follow up for 1.5cm lesion on liver. Consider monitoring.   3. Please follow up patient pain. Pain to be expected for 4-6 weeks with spenic infarct.   4. Please follow up recommendations from cardiology once patient seen.    DISCHARGE FOLLOWUP:  With Dr. Talbert Cage on December 06, 2010, at 10:15 a.m.  The patient also to follow up with Dr. Einar Gip of [facility] on November 5 at 12:30  p.m.  DISCHARGE MEDICATIONS: 1. Aspirin 325 take 1 by mouth daily. 2. Carvedilol 25 mg take 1 by mouth daily. 3. Crestor 20 mg 1 by mouth daily. 4. Fish oil take 2 g by mouth daily. 5. Vicodin 5/325 take q.4 h. for pain, take scheduled for first 2-3     days. 6. Multivitamin daily. 7. Ramipril 10 mg take 1 capsule by mouth daily. 8. Spironolactone take 25 mg by mouth daily. 9. Torsemide 20 mg by mouth every other day, Monday, Wednesday,     Friday. 10.Vitamin E over-the-counter take 20 units by mouth daily. 11.Zofran 4 mg p.o. q.6 h. p.r.n. for nausea. 12.MiraLax 17 g by mouth daily.  Hold for loose 2 stools. 13.Biotin over-the-counter daily. 14.Calcium 600 mg b.i.d. 15.Vitamin C over-the-counter 1 tab daily.  16.Vitamin D3 20 units 1 tablet by mouth daily.    ______________________________ Garret Reddish, MD   ______________________________ Blane Ohara Kadince Boxley, M.D.    SH/MEDQ  D:  11/29/2010  T:  11/30/2010  Job:  [id number]  Electronically Signed by Garret Reddish MD on 12/04/2010 04:08:13 PM Electronically Signed by Lissa Morales M.D. on 12/06/2010 08:01:35 AM

## 2010-12-06 NOTE — Assessment & Plan Note (Signed)
Incidental finding during CT for splenic infarct pain.  She is not in favor of surgery.  Discussed referral to urology for further work up.  She would like to wait until sees her cardiologist

## 2010-12-06 NOTE — Progress Notes (Signed)
  Subjective:    Patient ID: Jodi [name], female    DOB: [birth year], 77 y.o.   MRN: [id number]  HPI  Splenic Infarct Pain is much improved.  Took only one vicoden yesterday.  No shortness of breath or nausea or vomiting.  No constipation  Renal Mass No urinary symptoms.  She does not think she would want surgery.   Reviewed CT report < 2cm solid mass  Cardiac No increased chest pain or shortness of breath or syncope.  Able to walk 30 yds to exam room without chest pain but does have usual shortness of breath.  AS was noted a moderate to severe on recent echo  Review of Symptoms - see HPI  PMH - Smoking status noted.     Review of Systems     Objective:   Physical Exam  Alert no acute distress Lungs:  Normal respiratory effort, chest expands symmetrically. Lungs are clear to auscultation, no crackles or wheezes. Heart - Regular rate and rhythm.  Gr 3/6 systolic murmur Extremities:  No cyanosis, edema, or deformity noted with good range of motion of all major joints.   Abdomen: soft and non-tender without masses, organomegaly or hernias noted.  No guarding or rebound       Assessment & Plan:

## 2010-12-06 NOTE — Patient Instructions (Signed)
I will call you if your tests are not good.  Otherwise I will send you a letter.  If you do not hear from me with in 2 weeks please call our office.     Call me after your see Dr Einar Gip about the urology consult.   Leave a time and number where I can call you back.  Call if you have any severe side pain or shortness of breath or chest pain or leg swelling or bleeding

## 2011-01-24 ENCOUNTER — Other Ambulatory Visit: Payer: Self-pay | Admitting: Family Medicine

## 2011-01-24 NOTE — Telephone Encounter (Signed)
Refill request

## 2011-01-25 NOTE — Telephone Encounter (Signed)
Please call in  Thanks [facility]

## 2011-01-25 NOTE — Telephone Encounter (Signed)
Rx called in as written.

## 2011-01-27 ENCOUNTER — Other Ambulatory Visit: Payer: Self-pay | Admitting: Family Medicine

## 2011-01-31 ENCOUNTER — Other Ambulatory Visit: Payer: Self-pay | Admitting: Family Medicine

## 2011-01-31 MED ORDER — TRIAMCINOLONE ACETONIDE 0.1 % EX CREA: TOPICAL_CREAM | Freq: Two times a day (BID) | CUTANEOUS | Status: DC

## 2011-01-31 NOTE — Telephone Encounter (Signed)
Refill request

## 2011-01-31 NOTE — Telephone Encounter (Signed)
Refill by me in Dr. Erin Hearing' absence based on fax request.  Not on med list but low risk med so I will refill as requested.

## 2011-02-01 NOTE — Telephone Encounter (Signed)
Refill request

## 2011-04-25 ENCOUNTER — Encounter: Payer: Self-pay | Admitting: Family Medicine

## 2011-04-25 ENCOUNTER — Ambulatory Visit (INDEPENDENT_AMBULATORY_CARE_PROVIDER_SITE_OTHER): Payer: PRIVATE HEALTH INSURANCE | Admitting: Family Medicine

## 2011-04-25 VITALS — BP 142/80 | HR 64 | Temp 98.8°F | Ht 64.0 in | Wt 163.6 lb

## 2011-04-25 DIAGNOSIS — I10 Essential (primary) hypertension: Secondary | ICD-10-CM

## 2011-04-25 DIAGNOSIS — R300 Dysuria: Secondary | ICD-10-CM

## 2011-04-25 DIAGNOSIS — E7800 Pure hypercholesterolemia, unspecified: Secondary | ICD-10-CM

## 2011-04-25 DIAGNOSIS — I5022 Chronic systolic (congestive) heart failure: Secondary | ICD-10-CM

## 2011-04-25 DIAGNOSIS — N289 Disorder of kidney and ureter, unspecified: Secondary | ICD-10-CM

## 2011-04-25 DIAGNOSIS — N2889 Other specified disorders of kidney and ureter: Secondary | ICD-10-CM

## 2011-04-25 LAB — POCT URINALYSIS DIPSTICK
Bilirubin, UA: NEGATIVE
Blood, UA: NEGATIVE
Glucose, UA: NEGATIVE
Ketones, UA: NEGATIVE
Leukocytes, UA: NEGATIVE
Nitrite, UA: NEGATIVE
Protein, UA: NEGATIVE
Spec Grav, UA: 1.01
Urobilinogen, UA: 0.2
pH, UA: 5.5

## 2011-04-25 MED ORDER — CEPHALEXIN 500 MG PO CAPS: 500.0000 mg | ORAL_CAPSULE | Freq: Three times a day (TID) | ORAL | Status: AC

## 2011-04-25 NOTE — Patient Instructions (Addendum)
Take the Keflex tabs three times daily for 5 days  If it seems to get worse - fever, stomach pain, nausea or vomiting or bleeding then come right back  Come in for fasting blood tests - no food for 8 hours - call the day before

## 2011-04-25 NOTE — Assessment & Plan Note (Signed)
Her symptoms are very consistent with a UTI.  UA is completely normal.  No signs of other infection (PNA, cellulitis) or decompensation of her cardiac condition.   Will treat with antibiotics for UTI and ask her to return if not feeling back to normal

## 2011-04-25 NOTE — Progress Notes (Signed)
  Subjective:    Patient ID: Jodi [name], female    DOB: 1932/02/13, 78 y.o.   MRN: [id number]  HPI  Dysuria For last 3 days is better today.  Had frequency and burning and blood in her urine yesterday.  Feels like infections she has had before.   No fever or back pain or nausea or vomiting but did feel transient lightheadness this AM which has resolved.  No chest pain or leg swelling or shortness of breath . No change in her medications  Review of Symptoms - see HPI  PMH - Smoking status noted.     Review of Systems     Objective:   Physical Exam Alert no acute distress able to get up and down from exam table wihtou problems or assistance Heart - brady with [id number] systolic murmur unchanged from past Lungs:  Normal respiratory effort, chest expands symmetrically. Lungs are clear to auscultation, no crackles or wheezes. Extremities:  No cyanosis, No  Edema, Abdomen: soft and non-tender without masses, organomegaly or hernias noted.  No guarding or rebound No CVAT Skin:  Intact without suspicious lesions or rashes  UA noted           Assessment & Plan:

## 2011-05-07 NOTE — H&P (Signed)
Signed late

## 2011-05-14 ENCOUNTER — Other Ambulatory Visit: Payer: PRIVATE HEALTH INSURANCE

## 2011-05-14 DIAGNOSIS — E7800 Pure hypercholesterolemia, unspecified: Secondary | ICD-10-CM

## 2011-05-14 DIAGNOSIS — I10 Essential (primary) hypertension: Secondary | ICD-10-CM

## 2011-05-14 LAB — COMPREHENSIVE METABOLIC PANEL
ALT: 14 U/L (ref 0–35)
AST: 19 U/L (ref 0–37)
Albumin: 4.3 g/dL (ref 3.5–5.2)
Alkaline Phosphatase: 58 U/L (ref 39–117)
BUN: 31 mg/dL — ABNORMAL HIGH (ref 6–23)
CO2: 27 mEq/L (ref 19–32)
Calcium: 10 mg/dL (ref 8.4–10.5)
Chloride: 104 mEq/L (ref 96–112)
Creat: 1.37 mg/dL — ABNORMAL HIGH (ref 0.50–1.10)
Glucose, Bld: 109 mg/dL — ABNORMAL HIGH (ref 70–99)
Potassium: 4.6 mEq/L (ref 3.5–5.3)
Sodium: 140 mEq/L (ref 135–145)
Total Bilirubin: 0.8 mg/dL (ref 0.3–1.2)
Total Protein: 7.1 g/dL (ref 6.0–8.3)

## 2011-05-14 LAB — LIPID PANEL
Cholesterol: 206 mg/dL — ABNORMAL HIGH (ref 0–200)
HDL: 50 mg/dL (ref >39)
LDL Cholesterol: 115 mg/dL — ABNORMAL HIGH (ref 0–99)
Total CHOL/HDL Ratio: 4.1 Ratio
Triglycerides: 206 mg/dL — ABNORMAL HIGH (ref <150)
VLDL: 41 mg/dL — ABNORMAL HIGH (ref 0–40)

## 2011-05-14 NOTE — Progress Notes (Signed)
CMP AND FLP DONE TODAY Jodi [name] 

## 2011-05-15 ENCOUNTER — Encounter: Payer: Self-pay | Admitting: Family Medicine

## 2011-06-21 ENCOUNTER — Other Ambulatory Visit: Payer: Self-pay | Admitting: Family Medicine

## 2011-06-21 MED ORDER — HYDROCODONE-ACETAMINOPHEN 5-500 MG PO TABS: 1.0000 | ORAL_TABLET | Freq: Three times a day (TID) | ORAL | Status: DC | PRN

## 2011-06-21 NOTE — Telephone Encounter (Signed)
Called in. Jodi [name], Jodi [name]

## 2011-06-21 NOTE — Telephone Encounter (Signed)
Please call in vicoden thanks

## 2011-07-19 ENCOUNTER — Other Ambulatory Visit: Payer: Self-pay | Admitting: Family Medicine

## 2011-07-19 DIAGNOSIS — I10 Essential (primary) hypertension: Secondary | ICD-10-CM

## 2011-07-24 ENCOUNTER — Other Ambulatory Visit: Payer: Self-pay | Admitting: Family Medicine

## 2011-07-24 NOTE — Telephone Encounter (Signed)
Rx called in. Cameo Schmiesing, Salome Spotted

## 2011-07-24 NOTE — Telephone Encounter (Signed)
Please call in Rx Thanks [facility]

## 2011-09-06 ENCOUNTER — Ambulatory Visit (INDEPENDENT_AMBULATORY_CARE_PROVIDER_SITE_OTHER): Payer: PRIVATE HEALTH INSURANCE | Admitting: Family Medicine

## 2011-09-06 ENCOUNTER — Encounter: Payer: Self-pay | Admitting: Family Medicine

## 2011-09-06 VITALS — BP 156/80 | HR 58 | Temp 97.8°F | Ht 63.5 in | Wt 161.0 lb

## 2011-09-06 DIAGNOSIS — N390 Urinary tract infection, site not specified: Secondary | ICD-10-CM

## 2011-09-06 DIAGNOSIS — R300 Dysuria: Secondary | ICD-10-CM

## 2011-09-06 LAB — POCT URINALYSIS DIPSTICK
Bilirubin, UA: NEGATIVE
Glucose, UA: NEGATIVE
Ketones, UA: NEGATIVE
Nitrite, UA: NEGATIVE
Protein, UA: NEGATIVE
Spec Grav, UA: <=1.005
Urobilinogen, UA: 0.2
pH, UA: 6

## 2011-09-06 LAB — POCT UA - MICROSCOPIC ONLY

## 2011-09-06 MED ORDER — CEPHALEXIN 500 MG PO CAPS: 500.0000 mg | ORAL_CAPSULE | Freq: Two times a day (BID) | ORAL | Status: AC

## 2011-09-06 MED ORDER — PHENAZOPYRIDINE HCL 100 MG PO TABS: 100.0000 mg | ORAL_TABLET | Freq: Three times a day (TID) | ORAL | Status: AC | PRN

## 2011-09-06 NOTE — Patient Instructions (Addendum)
Jodi [name],  You have a UTI.   Antibiotic: Please take keflex 500 mg twice daily for one week.  Pain/shock feeling: pyridium every 8 hrs as needed.  Drink plenty of water. Call and come in if you develop fever, nausea or vomiting.   Dr. Adrian Blackwater

## 2011-09-06 NOTE — Assessment & Plan Note (Signed)
A: suspect UTI based on symptoms and UA. No evidence of pyelonephritis.  P:  Treat with keflex and pyridium Reviewed s/s to prompt return to clinic. see AVS.

## 2011-09-06 NOTE — Progress Notes (Signed)
Subjective:     Patient ID: Jodi [name], female   DOB: 12-Aug-1932, 78 y.o.   MRN: [id number]  HPI 78 yo F presents for same day visit for evaluation of dysuria x 2 days. Her symptoms are associated with frequency. She denies incontinence, fever, nausea and vomiting.   Med history: positive for UTI in the past   Review of Systems As per HPI      Objective:   Physical Exam BP 156/80  Pulse 58  Temp 97.8 F (36.6 C) (Oral)  Ht 5' 3.5" (1.613 m)  Wt 161 lb (73.029 kg)  BMI 28.07 kg/m2 General appearance: alert, cooperative and no distress Back: symmetric, no curvature. ROM normal. No CVA tenderness. Abdomen: soft, non-tender; bowel sounds normal; no masses,  no organomegaly  Urine dipstick shows positive for RBC's and positive for leukocytes.  Micro exam: 30-40 WBC's per HPF and 1+ bacteria.    Assessment and Plan:

## 2011-09-09 LAB — URINE CULTURE: Colony Count: 10000

## 2011-10-01 ENCOUNTER — Other Ambulatory Visit (HOSPITAL_COMMUNITY): Payer: Self-pay | Admitting: Urology

## 2011-10-01 DIAGNOSIS — D1771 Benign lipomatous neoplasm of kidney: Secondary | ICD-10-CM

## 2011-10-17 ENCOUNTER — Ambulatory Visit: Payer: PRIVATE HEALTH INSURANCE | Admitting: Family Medicine

## 2011-10-22 ENCOUNTER — Encounter: Payer: Self-pay | Admitting: Family Medicine

## 2011-10-22 ENCOUNTER — Ambulatory Visit (INDEPENDENT_AMBULATORY_CARE_PROVIDER_SITE_OTHER): Payer: PRIVATE HEALTH INSURANCE | Admitting: Family Medicine

## 2011-10-22 ENCOUNTER — Other Ambulatory Visit: Payer: Self-pay | Admitting: Family Medicine

## 2011-10-22 VITALS — BP 154/77 | HR 58 | Temp 97.7°F | Ht 63.5 in | Wt 158.0 lb

## 2011-10-22 DIAGNOSIS — E7800 Pure hypercholesterolemia, unspecified: Secondary | ICD-10-CM

## 2011-10-22 DIAGNOSIS — Z23 Encounter for immunization: Secondary | ICD-10-CM

## 2011-10-22 DIAGNOSIS — R5383 Other fatigue: Secondary | ICD-10-CM | POA: Insufficient documentation

## 2011-10-22 DIAGNOSIS — R5381 Other malaise: Secondary | ICD-10-CM

## 2011-10-22 DIAGNOSIS — I10 Essential (primary) hypertension: Secondary | ICD-10-CM

## 2011-10-22 LAB — FERRITIN: Ferritin: 74 ng/mL (ref 10–291)

## 2011-10-22 LAB — TSH: TSH: 3.724 u[IU]/mL (ref 0.350–4.500)

## 2011-10-22 LAB — BASIC METABOLIC PANEL
BUN: 27 mg/dL — ABNORMAL HIGH (ref 6–23)
CO2: 27 mEq/L (ref 19–32)
Calcium: 10.7 mg/dL — ABNORMAL HIGH (ref 8.4–10.5)
Chloride: 102 mEq/L (ref 96–112)
Creat: 1.24 mg/dL — ABNORMAL HIGH (ref 0.50–1.10)
Glucose, Bld: 100 mg/dL — ABNORMAL HIGH (ref 70–99)
Potassium: 4.7 mEq/L (ref 3.5–5.3)
Sodium: 139 mEq/L (ref 135–145)

## 2011-10-22 LAB — CBC
HCT: 35.5 % — ABNORMAL LOW (ref 36.0–46.0)
Hemoglobin: 12.4 g/dL (ref 12.0–15.0)
MCH: 31.2 pg (ref 26.0–34.0)
MCHC: 34.9 g/dL (ref 30.0–36.0)
MCV: 89.4 fL (ref 78.0–100.0)
Platelets: 222 10*3/uL (ref 150–400)
RBC: 3.97 MIL/uL (ref 3.87–5.11)
RDW: 13.8 % (ref 11.5–15.5)
WBC: 7.2 10*3/uL (ref 4.0–10.5)

## 2011-10-22 LAB — LDL CHOLESTEROL, DIRECT: Direct LDL: 79 mg/dL

## 2011-10-22 MED ORDER — ZOLPIDEM TARTRATE 5 MG PO TABS: 5.0000 mg | ORAL_TABLET | Freq: Every evening | ORAL | Status: DC | PRN

## 2011-10-22 NOTE — Assessment & Plan Note (Signed)
Well controlled 

## 2011-10-22 NOTE — Progress Notes (Signed)
  Subjective:    Patient ID: Jodi [name], female    DOB: July 26, 1932, 78 y.o.   MRN: [id number]  HPI  Fatigue Feeling less energy and less endurance of the last few months.  Has to stop and rest 4 times when she works her usual mile.  Wants to do things just feels she can't.  No chest pain but does have shortness of breath with exertion.  No nocturnal symptoms or edema.  No change in her usual medications.  No fever or weight loss or nausea or vomiting or bleeding  HYPERTENSION Disease Monitoring Home BP Monitoring not doing Chest pain- no     Dyspnea-  See above  Medications Compliance: taking as prescribed. Lightheadedness-  no  Edema-  no   ROS - See HPI  PMH Lab Review   Potassium  Date Value Range Status  05/14/2011 4.6  3.5 - 5.3 mEq/L Final     Sodium  Date Value Range Status  05/14/2011 140  135 - 145 mEq/L Final        Review of Symptoms - see HPI  PMH - Smoking status noted.     Review of Systems     Objective:   Physical Exam  Alert no acute distress Heart - RRR with Gr 3/6 systolic mrmur Lungs:  Normal respiratory effort, chest expands symmetrically. Lungs are clear to auscultation, no crackles or wheezes. Extremities:  No cyanosis, edema, or deformity noted with good range of motion of all major joints.   Neck:  No deformities, thyromegaly, masses, or tenderness noted.   Supple with full range of motion without pain.       Assessment & Plan:

## 2011-10-22 NOTE — Assessment & Plan Note (Signed)
No evident cause but likely due to cardiac condition.   She does not appear to be fluid overloaded with no edema.  Will check labs for anemia, thyroid etc.

## 2011-10-22 NOTE — Patient Instructions (Addendum)
Make an appointment with Lamont Dowdy our Health Coach   I will call you if your tests are not good.  Otherwise I will send you a letter.  If you do not hear from me with in 2 weeks please call our office.     If your tiredness is getting worse or if you have chest pain then come back.  Otherwise talk with Dr Einar Gip in November

## 2011-11-08 ENCOUNTER — Ambulatory Visit (HOSPITAL_COMMUNITY)
Admission: RE | Admit: 2011-11-08 | Discharge: 2011-11-08 | Disposition: A | Discharge status: Home / Self Care | Payer: PRIVATE HEALTH INSURANCE | Source: Ambulatory Visit | Attending: Urology | Admitting: Urology

## 2011-11-08 DIAGNOSIS — K8020 Calculus of gallbladder without cholecystitis without obstruction: Secondary | ICD-10-CM | POA: Insufficient documentation

## 2011-11-08 DIAGNOSIS — N289 Disorder of kidney and ureter, unspecified: Secondary | ICD-10-CM | POA: Insufficient documentation

## 2011-11-08 DIAGNOSIS — D1771 Benign lipomatous neoplasm of kidney: Secondary | ICD-10-CM

## 2011-11-08 DIAGNOSIS — Q619 Cystic kidney disease, unspecified: Secondary | ICD-10-CM | POA: Insufficient documentation

## 2011-11-08 MED ORDER — GADOBENATE DIMEGLUMINE 529 MG/ML IV SOLN
7.0000 mL | Freq: Once | INTRAVENOUS | Status: AC | PRN
  Administered 2011-11-08: 7 mL via INTRAVENOUS

## 2011-11-13 ENCOUNTER — Other Ambulatory Visit: Payer: Self-pay | Admitting: Urology

## 2011-11-13 DIAGNOSIS — N2889 Other specified disorders of kidney and ureter: Secondary | ICD-10-CM

## 2011-11-21 ENCOUNTER — Encounter: Payer: Self-pay | Admitting: *Deleted

## 2011-11-21 ENCOUNTER — Other Ambulatory Visit: Payer: Self-pay | Admitting: Family Medicine

## 2011-11-21 NOTE — Telephone Encounter (Signed)
This encounter was created in error - please disregard.

## 2011-12-11 ENCOUNTER — Ambulatory Visit
Admission: RE | Admit: 2011-12-11 | Discharge: 2011-12-11 | Disposition: A | Discharge status: Home / Self Care | Payer: PRIVATE HEALTH INSURANCE | Source: Ambulatory Visit | Attending: Urology | Admitting: Urology

## 2011-12-11 DIAGNOSIS — N2889 Other specified disorders of kidney and ureter: Secondary | ICD-10-CM

## 2011-12-11 HISTORY — DX: Cardiac murmur, unspecified: R01.1

## 2011-12-11 HISTORY — DX: Nonrheumatic aortic (valve) stenosis: I35.0

## 2011-12-11 HISTORY — DX: Acute myocardial infarction, unspecified: I21.9

## 2011-12-11 HISTORY — DX: Atherosclerotic heart disease of native coronary artery without angina pectoris: I25.10

## 2011-12-11 HISTORY — DX: Essential (primary) hypertension: I10

## 2011-12-13 ENCOUNTER — Other Ambulatory Visit: Payer: Self-pay | Admitting: Interventional Radiology

## 2011-12-13 DIAGNOSIS — N2889 Other specified disorders of kidney and ureter: Secondary | ICD-10-CM

## 2011-12-13 NOTE — Progress Notes (Signed)
Patient ID: Jodi [name], female   DOB: 05/23/1932, 78 y.o.   MRN: [id number]  NEW PATIENT OFFICE VISIT  December 12, 2011  Rana Snare, MD [facility] [address] [place], [place] [address]  Re: Jodi [name] (DOB: 10-Feb-2032)  Dear Shanon Brow:  Thank you for sending Jodi [name] for consultation regarding possible biopsy and future percutaneous ablation of a left renal mass. 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 This was also present by prior CT imaging in 2012 and 2006 and has been stable with no evidence of significant enlargement or bleeding. During workup, note was made of a partially cystic and enhancing lesion of the upper pole of the left kidney characterized by CT and MRI studies in October and measuring approximately 1.5 x 2.3 cm on MRI. This does not convincingly contain fat by imaging. The patient is asymptomatic. She has no history of prior renal malignancy or other cancer. She has not had any renal surgery.  Past Medical History: 1. History of coronary artery disease with prior CABG x2 in 1994 and 2003. The patient is followed by Dr. [name]. The patient also has a history of chronic heart murmur. 2. History of hypertension. The patient is followed at the [facility]. 3. History of hypercholesterolemia. 4. History of gastroesophageal reflux.  Medications: Aspirin 325 mg daily, carvedilol 25 mg two tablets daily, torsemide 20 mg three times weekly, Spironolactone 25 mg daily, Crestor 20 mg daily, ramipril 10 mg daily.  Allergies: Sulfa drugs.  Social History: The patient is single. She has seven living children and lives in [place]. She denies alcohol or tobacco use.  Family History: History of heart disease and high blood pressure in  multiple family members. Father with history of leukemia.  Page 2 Re: Jodi [name] (DOB: 10-Feb-2032)  Review of Systems: General: 5'3.5" in height. 160 pounds in weight. Ears, nose and throat: Chronic hearing loss in the left ear requiring hearing aid. Eyes: History of prior bilateral cataract surgery. Gastrointestinal: No abdominal pain, nausea, vomiting or diarrhea. Genitourinary: Eight pregnancies and eight deliveries in the past. One daughter deceased after complications of spina bifida. No hematuria, dysuria, abnormal urinary frequency or urinary retention. Heart: No chest pain or palpitations. Musculoskeletal: No significant joint or muscle pain. Neurologic: No significant neurologic symptoms. Respiratory: No dyspnea or cough.  Exam: Vital signs: Blood pressure 172/81, pulse 51, respirations 14, temperature 98.2. Oxygen saturation 98% on room air. Chest: Clear to auscultation bilaterally. Heart: II/VI systolic ejection murmur. Regular rate and rhythm. Abdomen: Soft, nontender and nondistended. No flank pain. Extremities: No edema.  Labs: Sodium 139, potassium 4.7, BUN 27, creatinine 1.24. Creatinine was as high as 1.64 one year ago. WBC 7.2, hemoglobin 12.4, hematocrit 35.5, platelet count 222.  Imaging: Multiple cross-sectional imaging studies were reviewed. I personally made additional measurements to compare lesion size and growth. 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 The left renal lesion is vaguely visible by CT on 02/11/04, measuring approximately 1.8 cm. On a CT scan dated 11/27/10, the lesion measures approximately 1.5 x 2.0 cm. On more recent imaging by CT on 05/25/11 and MRI on 11/28/11, the lesion measures approximately 1.5 x 2.3 cm. I do not see convincing evidence of any growth  in the last 6 months and likely in the last year of the left renal lesion. There  is no evidence of metastatic disease or renal vein involvement. The deep margin of the lesion does approach the upper pole collecting system and the lesion is largely endophytic. There may be a small rim of fat along the periphery of the lesion by CT at its posterior margin. The MRI is not convincing for definite fat content. The lesion is more cystic in signal and intensity compared to the right renal lesion by MRI and does display some degree of internal enhancement.  Assessment: I met with Jodi [name] and reviewed imaging findings with her. The left renal lesion is complex and may be a slow- growing neoplasm. I did tell the patient that this lesion could be benign or malignant. This could represent a lipid poor angiomyolipoma given the presence of an angiomyolipoma in the other kidney or a partially cystic malignancy. I do not feel that the lesion has been growing significantly, especially over the last year. When reviewing laboratory tests, there is evidence of renal insufficiency which is not officially documented as a medical problem. Given her significant medical comorbidities and history of two separate cardiac bypass procedures, as you have discussed with the patient, it may help to initially obtain biopsy in order to try and determine a tissue diagnosis. The lesion is of size and location which may be amenable in the future to percutaneous cryoablation. This would be indicated based on renal insufficiency and multiple medical comorbidities. The patient is fairly convinced that this does not represent malignancy and is agreeable to undergoing biopsy at this time.  Page 3 Re: Jodi [name] (DOB: 30-Oct-2032)  Details of percutaneous core biopsy of the kidney were discussed with the patient which we should be able to accomplish with ultrasound guidance. I also gave the patient some information and briefly discussed the potential role of percutaneous cryoablation in the future  if the lesion is suspicious for malignancy. We will set up an ultrasound guided left renal biopsy as an outpatient.  Thank you again for sending Jodi [name] for consultation and allowing me to participate in her care. I will forward the biopsy results to you.  Sincerely,  Aletta Edouard, MD Doran Durand  C: [name], MD Talbert Cage, MD

## 2011-12-17 ENCOUNTER — Other Ambulatory Visit: Payer: Self-pay | Admitting: Radiology

## 2011-12-19 ENCOUNTER — Encounter (HOSPITAL_COMMUNITY): Payer: Self-pay | Admitting: Pharmacy Technician

## 2011-12-21 ENCOUNTER — Ambulatory Visit (HOSPITAL_COMMUNITY)
Admission: RE | Admit: 2011-12-21 | Discharge: 2011-12-21 | Disposition: A | Discharge status: Home / Self Care | Payer: PRIVATE HEALTH INSURANCE | Source: Ambulatory Visit | Attending: Interventional Radiology | Admitting: Interventional Radiology

## 2011-12-21 ENCOUNTER — Encounter (HOSPITAL_COMMUNITY): Payer: Self-pay

## 2011-12-21 DIAGNOSIS — N289 Disorder of kidney and ureter, unspecified: Secondary | ICD-10-CM | POA: Insufficient documentation

## 2011-12-21 DIAGNOSIS — N2889 Other specified disorders of kidney and ureter: Secondary | ICD-10-CM

## 2011-12-21 LAB — CBC
HCT: 35.8 % — ABNORMAL LOW (ref 36.0–46.0)
Hemoglobin: 12.3 g/dL (ref 12.0–15.0)
MCH: 31.1 pg (ref 26.0–34.0)
MCHC: 34.4 g/dL (ref 30.0–36.0)
MCV: 90.4 fL (ref 78.0–100.0)
Platelets: 204 10*3/uL (ref 150–400)
RBC: 3.96 MIL/uL (ref 3.87–5.11)
RDW: 12.5 % (ref 11.5–15.5)
WBC: 7.8 10*3/uL (ref 4.0–10.5)

## 2011-12-21 LAB — BASIC METABOLIC PANEL
BUN: 28 mg/dL — ABNORMAL HIGH (ref 6–23)
CO2: 28 mEq/L (ref 19–32)
Calcium: 10.7 mg/dL — ABNORMAL HIGH (ref 8.4–10.5)
Chloride: 100 mEq/L (ref 96–112)
Creatinine, Ser: 1.28 mg/dL — ABNORMAL HIGH (ref 0.50–1.10)
GFR calc Af Amer: 46 mL/min — ABNORMAL LOW (ref >90)
GFR calc non Af Amer: 39 mL/min — ABNORMAL LOW (ref >90)
Glucose, Bld: 126 mg/dL — ABNORMAL HIGH (ref 70–99)
Potassium: 4.7 mEq/L (ref 3.5–5.1)
Sodium: 138 mEq/L (ref 135–145)

## 2011-12-21 LAB — PROTIME-INR
INR: 1.05 (ref 0.00–1.49)
Prothrombin Time: 13.6 seconds (ref 11.6–15.2)

## 2011-12-21 LAB — APTT: aPTT: 34 seconds (ref 24–37)

## 2011-12-21 MED ORDER — MIDAZOLAM HCL 2 MG/2ML IJ SOLN
INTRAMUSCULAR | Status: AC
  Filled 2011-12-21: qty 6

## 2011-12-21 MED ORDER — HYDROCODONE-ACETAMINOPHEN 5-325 MG PO TABS
1.0000 | ORAL_TABLET | ORAL | Status: DC | PRN
  Filled 2011-12-21: qty 2

## 2011-12-21 MED ORDER — FENTANYL CITRATE 0.05 MG/ML IJ SOLN
INTRAMUSCULAR | Status: AC | PRN
  Administered 2011-12-21: 25 ug via INTRAVENOUS
  Administered 2011-12-21 (×2): 50 ug via INTRAVENOUS

## 2011-12-21 MED ORDER — SODIUM CHLORIDE 0.9 % IV SOLN
INTRAVENOUS | Status: DC
  Administered 2011-12-21: 12:00:00 via INTRAVENOUS

## 2011-12-21 MED ORDER — FENTANYL CITRATE 0.05 MG/ML IJ SOLN
INTRAMUSCULAR | Status: AC
  Filled 2011-12-21: qty 6

## 2011-12-21 MED ORDER — MIDAZOLAM HCL 2 MG/2ML IJ SOLN
INTRAMUSCULAR | Status: AC | PRN
  Administered 2011-12-21 (×2): 0.5 mg via INTRAVENOUS
  Administered 2011-12-21: 1 mg via INTRAVENOUS

## 2011-12-21 NOTE — H&P (Signed)
Chief Complaint: "I'm here for a kidney biopsy" Referring Physician:Grapey HPI: Jodi [name] is an 78 y.o. female found to have a left sided kidney lesion that appears slow growing. After consultation with Dr. Kathlene Cote, she is set up for biopsy of this lesion, hopefully by [place] but possibly by CT. Please see IR Rad Eval in PACS for Dr. Kathlene Cote full consult details. PMHx and meds reviewed as below.  Past Medical History:  Past Medical History  Diagnosis Date  . Myocardial infarction   . Coronary artery disease   . Hypertension   . Heart murmur   . Chronic kidney disease   . Aortic stenosis     Past Surgical History:  Past Surgical History  Procedure Date  . Radical hysterectomy   . Coronary artery bypass graft 1995 and 2003  . Abdominal hysterectomy   . Eye surgery     Family History:  Family History  Problem Relation Age of Onset  . Coronary artery disease Mother   . Coronary artery disease Brother   . Stroke Mother     Social History:  reports that she has never smoked. She has never used smokeless tobacco. She reports that she does not drink alcohol or use illicit drugs.  Allergies:  Allergies  Allergen Reactions  . Sulfamethoxazole W-Trimethoprim Other (See Comments)    REACTION: shaking /chills    Medications: Ascorbic Acid (VITAMIN C) 500 MG tablet (Taking) Sig - Route: Take 500 mg by mouth daily. - Oral Class: Historical Med Number of times this order has been changed since signing: 1 Order [address] aspirin 325 MG tablet (Taking) Sig - Route: Take 325 mg by mouth daily. - Oral Class: Historical Med Number of times this order has been changed since signing: 1 Order [address] Calcium Carbonate-Vitamin D (CALCARB 600/D) 600-400 MG-UNIT per tablet (Taking) Sig - Route: Take 1 tablet by mouth daily. - Oral Class: Historical Med Number of times this order has been changed since signing: 3 Order [address] HYDROcodone-acetaminophen (VICODIN) 5-500 MG per tablet  (Taking) 30 tablet 2 06/21/2011 Sig - Route: Take 1 tablet by mouth every 8 (eight) hours as needed for pain. - Oral Class: Phone In Number of times this order has been changed since signing: 1 Order [address] Multiple Vitamin (MULTIVITAMIN) capsule (Taking) Sig - Route: Take 1 capsule by mouth daily. - Oral Class: Historical Med Number of times this order has been changed since signing: 1 Order [address] vitamin E 400 UNIT capsule (Taking) Sig - Route: Take 400 Units by mouth daily. - Oral Class: Historical Med Number of times this order has been changed since signing: 1 Order [address] zolpidem (AMBIEN) 5 MG tablet (Taking) 30 tablet 0 10/22/2011 Sig - Route: Take 1 tablet (5 mg total) by mouth at bedtime as needed for sleep. -   Please HPI for pertinent positives, otherwise complete 10 system ROS negative.  Physical Exam: Temp: 98.2   HR: 55    RR: 16   BP: 173:66 O2: 97%   General Appearance:  Alert, cooperative, no distress, appears stated age  Head:  Normocephalic, without obvious abnormality, atraumatic  ENT: Unremarkable  Neck: Supple, symmetrical, trachea midline, no adenopathy, thyroid: not enlarged, symmetric, no tenderness/mass/nodules  Lungs:   Clear to auscultation bilaterally, no w/r/r, respirations unlabored without use of accessory muscles.  Chest Wall:  No tenderness or deformity.  Heart:  Regular rate and rhythm, S1, S2 normal, no murmur, rub or gallop. Carotids 2+ without bruit.  Neurologic: Normal affect, no gross deficits.   No results found for this or any previous visit (from the past 48 hour(s)). No results found.  Assessment/Plan Left renal mass For [place], poss CT, guided biopsy today Discussed procedure and risks, including use of sedation. Labs pending Consent signed in chart   Ascencion Dike PA-C 12/21/2011, 12:10 PM

## 2011-12-21 NOTE — H&P (Signed)
Agree 

## 2011-12-21 NOTE — Procedures (Signed)
Procedure:  Ultrasound guided core biopsy of left renal mass Findings:  Left posterior upper pole renal mass sampled with 18 G core device through 17 G needle.  Two intact core specimens obtained.

## 2012-01-02 ENCOUNTER — Other Ambulatory Visit: Payer: Self-pay | Admitting: *Deleted

## 2012-01-02 MED ORDER — SPIRONOLACTONE 25 MG PO TABS: 25.0000 mg | ORAL_TABLET | Freq: Every day | ORAL | Status: DC

## 2012-01-14 ENCOUNTER — Other Ambulatory Visit: Payer: Self-pay | Admitting: Family Medicine

## 2012-01-15 NOTE — Telephone Encounter (Signed)
Rx is ready at [facility].  [name]

## 2012-01-15 NOTE — Telephone Encounter (Signed)
She should have had an Rx called in that I signed on 12/9 for 30 NR Can you check to make sure it was if not then please call in Thanks  [facility]

## 2012-01-28 ENCOUNTER — Encounter: Payer: Self-pay | Admitting: Family Medicine

## 2012-01-28 ENCOUNTER — Ambulatory Visit (INDEPENDENT_AMBULATORY_CARE_PROVIDER_SITE_OTHER): Payer: PRIVATE HEALTH INSURANCE | Admitting: Family Medicine

## 2012-01-28 VITALS — BP 110/65 | HR 53 | Temp 98.8°F | Ht 63.5 in | Wt 160.3 lb

## 2012-01-28 DIAGNOSIS — H6690 Otitis media, unspecified, unspecified ear: Secondary | ICD-10-CM

## 2012-01-28 DIAGNOSIS — N390 Urinary tract infection, site not specified: Secondary | ICD-10-CM | POA: Insufficient documentation

## 2012-01-28 DIAGNOSIS — R300 Dysuria: Secondary | ICD-10-CM

## 2012-01-28 LAB — POCT URINALYSIS DIPSTICK
Bilirubin, UA: NEGATIVE
Glucose, UA: 100
Ketones, UA: NEGATIVE
Nitrite, UA: POSITIVE
Protein, UA: 100
Spec Grav, UA: 1.01
Urobilinogen, UA: 2
pH, UA: 5

## 2012-01-28 MED ORDER — CEPHALEXIN 500 MG PO CAPS: 500.0000 mg | ORAL_CAPSULE | Freq: Three times a day (TID) | ORAL | Status: DC

## 2012-01-28 MED ORDER — OFLOXACIN 0.3 % OT SOLN: 10.0000 [drp] | Freq: Every day | OTIC | Status: DC

## 2012-01-28 NOTE — Assessment & Plan Note (Signed)
Today with acute on chronic otits media of right ear.  Will rx floxin otic daily x 7 days.  If continues may need BID dosing.

## 2012-01-28 NOTE — Progress Notes (Signed)
  Subjective:    Patient ID: Jodi [name], female    DOB: 11-13-1932, 78 y.o.   MRN: [id number]  HPI Work in for dysuria and ear pain  Dysuria x 3 days.  Has uti 3-4 months ago, feels similar.  Reports urinary frequency, no hesitation.  And dysuria.  No fever, chills, abdominal pain, flank pain, nausea, vomting.  No recent antibiotics.  Has been taking azo.  Right ear pain:  Reports ear drainage and pain x 2 weeks.  Has history of right Tm chronic perforation.  Reports chronic decreased hearing, no recent change.  No fever, chills.  No URI symtpoms  I have reviewed patient's  PMH, FH, and Social history and Medications as related to this visit. No recent abx.   Review of Systems    see HPI Objective:   Physical Exam GEN: Alert & Oriented, No acute distress HEENT: [place]/AT. EOMI, PERRLA, no conjunctival injection or scleral icterus.  Right Tm perforated with some mucous-like drainage.  Mild erythema in canal,  No pain on manipulation of canal. CV:  Regular Rate & Rhythm, no murmur Respiratory:  Normal work of breathing, CTAB Abd:  + BS, soft, no tenderness to palpation, no flank pain         Assessment & Plan:

## 2012-01-28 NOTE — Assessment & Plan Note (Signed)
Symptoms and u/a suggestive of UTI, although results altered by use of AZO.  Will rx keflex, await urine culture.

## 2012-01-28 NOTE — Patient Instructions (Addendum)
Ear drops for ear infection- 10 drops in right ear for 7 days  Urinary Tract Infection A urinary tract infection (UTI) is often caused by a germ (bacteria). A UTI is usually helped with medicine (antibiotics) that kills germs. Take all the medicine until it is gone. Do this even if you are feeling better. You are usually better in 7 to 10 days. HOME CARE    Drink enough water and fluids to keep your pee (urine) clear or pale yellow. Drink:   Cranberry juice.   Water.   Avoid:   Caffeine.   Tea.   Bubbly (carbonated) drinks.   Alcohol.   Only take medicine as told by your doctor.   To prevent further infections:   Pee often.   After pooping (bowel movement), women should wipe from front to back. Use each tissue only once.   Pee before and after having sex (intercourse).  Ask your doctor when your test results will be ready. Make sure you follow up and get your test results.   GET HELP RIGHT AWAY IF:    There is very bad back pain or lower belly (abdominal) pain.   You get the chills.   You have a fever.   Your baby is older than 3 months with a rectal temperature of 102 F (38.9 C) or higher.   Your baby is 61 months old or younger with a rectal temperature of 100.4 F (38 C) or higher.   You feel sick to your stomach (nauseous) or throw up (vomit).   There is continued burning with peeing.   Your problems are not better in 3 days. Return sooner if you are getting worse.  MAKE SURE YOU:    Understand these instructions.   Will watch your condition.   Will get help right away if you are not doing well or get worse.  Document Released: 07/11/2007 Document Revised: 04/16/2011 Document Reviewed: 07/11/2007 [facility] Patient Information 2013 [place].

## 2012-01-29 LAB — URINE CULTURE
Colony Count: NO GROWTH
Organism ID, Bacteria: NO GROWTH

## 2012-02-04 NOTE — Telephone Encounter (Signed)
This encounter was created in error - please disregard.

## 2012-02-04 NOTE — Telephone Encounter (Signed)
Error

## 2012-02-11 ENCOUNTER — Inpatient Hospital Stay (HOSPITAL_COMMUNITY)
Admission: EM | Admit: 2012-02-11 | Discharge: 2012-02-14 | DRG: 292 | Disposition: A | Discharge status: Home / Self Care | Payer: PRIVATE HEALTH INSURANCE | Attending: Family Medicine | Admitting: Family Medicine

## 2012-02-11 ENCOUNTER — Emergency Department (HOSPITAL_COMMUNITY): Payer: PRIVATE HEALTH INSURANCE

## 2012-02-11 ENCOUNTER — Encounter (HOSPITAL_COMMUNITY): Payer: Self-pay | Admitting: Emergency Medicine

## 2012-02-11 DIAGNOSIS — I252 Old myocardial infarction: Secondary | ICD-10-CM

## 2012-02-11 DIAGNOSIS — M159 Polyosteoarthritis, unspecified: Secondary | ICD-10-CM | POA: Diagnosis present

## 2012-02-11 DIAGNOSIS — E782 Mixed hyperlipidemia: Secondary | ICD-10-CM | POA: Diagnosis present

## 2012-02-11 DIAGNOSIS — Z79899 Other long term (current) drug therapy: Secondary | ICD-10-CM

## 2012-02-11 DIAGNOSIS — I359 Nonrheumatic aortic valve disorder, unspecified: Secondary | ICD-10-CM | POA: Diagnosis present

## 2012-02-11 DIAGNOSIS — I498 Other specified cardiac arrhythmias: Secondary | ICD-10-CM | POA: Diagnosis present

## 2012-02-11 DIAGNOSIS — H6690 Otitis media, unspecified, unspecified ear: Secondary | ICD-10-CM | POA: Diagnosis present

## 2012-02-11 DIAGNOSIS — I509 Heart failure, unspecified: Secondary | ICD-10-CM

## 2012-02-11 DIAGNOSIS — I129 Hypertensive chronic kidney disease with stage 1 through stage 4 chronic kidney disease, or unspecified chronic kidney disease: Secondary | ICD-10-CM | POA: Diagnosis present

## 2012-02-11 DIAGNOSIS — Z7982 Long term (current) use of aspirin: Secondary | ICD-10-CM

## 2012-02-11 DIAGNOSIS — E7800 Pure hypercholesterolemia, unspecified: Secondary | ICD-10-CM | POA: Diagnosis present

## 2012-02-11 DIAGNOSIS — E876 Hypokalemia: Secondary | ICD-10-CM | POA: Diagnosis present

## 2012-02-11 DIAGNOSIS — N289 Disorder of kidney and ureter, unspecified: Secondary | ICD-10-CM

## 2012-02-11 DIAGNOSIS — N189 Chronic kidney disease, unspecified: Secondary | ICD-10-CM | POA: Diagnosis present

## 2012-02-11 DIAGNOSIS — I2510 Atherosclerotic heart disease of native coronary artery without angina pectoris: Secondary | ICD-10-CM | POA: Diagnosis present

## 2012-02-11 DIAGNOSIS — I10 Essential (primary) hypertension: Secondary | ICD-10-CM | POA: Diagnosis present

## 2012-02-11 DIAGNOSIS — Z951 Presence of aortocoronary bypass graft: Secondary | ICD-10-CM

## 2012-02-11 DIAGNOSIS — G4700 Insomnia, unspecified: Secondary | ICD-10-CM | POA: Diagnosis present

## 2012-02-11 DIAGNOSIS — R5383 Other fatigue: Secondary | ICD-10-CM | POA: Diagnosis present

## 2012-02-11 DIAGNOSIS — N390 Urinary tract infection, site not specified: Secondary | ICD-10-CM | POA: Diagnosis present

## 2012-02-11 DIAGNOSIS — Z8249 Family history of ischemic heart disease and other diseases of the circulatory system: Secondary | ICD-10-CM

## 2012-02-11 DIAGNOSIS — I5023 Acute on chronic systolic (congestive) heart failure: Principal | ICD-10-CM | POA: Diagnosis present

## 2012-02-11 DIAGNOSIS — Z823 Family history of stroke: Secondary | ICD-10-CM

## 2012-02-11 DIAGNOSIS — N179 Acute kidney failure, unspecified: Secondary | ICD-10-CM | POA: Diagnosis present

## 2012-02-11 HISTORY — DX: Shortness of breath: R06.02

## 2012-02-11 HISTORY — DX: Heart failure, unspecified: I50.9

## 2012-02-11 HISTORY — DX: Cerebral infarction, unspecified: I63.9

## 2012-02-11 LAB — PRO B NATRIURETIC PEPTIDE: Pro B Natriuretic peptide (BNP): 8730 pg/mL — ABNORMAL HIGH (ref 0–450)

## 2012-02-11 LAB — URINALYSIS, ROUTINE W REFLEX MICROSCOPIC
Bilirubin Urine: NEGATIVE
Glucose, UA: NEGATIVE mg/dL
Hgb urine dipstick: NEGATIVE
Ketones, ur: NEGATIVE mg/dL
Nitrite: NEGATIVE
Protein, ur: NEGATIVE mg/dL
Specific Gravity, Urine: 1.01 (ref 1.005–1.030)
Urobilinogen, UA: 0.2 mg/dL (ref 0.0–1.0)
pH: 7.5 (ref 5.0–8.0)

## 2012-02-11 LAB — CBC WITH DIFFERENTIAL/PLATELET
Basophils Absolute: 0.1 10*3/uL (ref 0.0–0.1)
Basophils Relative: 1 % (ref 0–1)
Eosinophils Absolute: 0.3 10*3/uL (ref 0.0–0.7)
Eosinophils Relative: 4 % (ref 0–5)
HCT: 33 % — ABNORMAL LOW (ref 36.0–46.0)
Hemoglobin: 11 g/dL — ABNORMAL LOW (ref 12.0–15.0)
Lymphocytes Relative: 19 % (ref 12–46)
Lymphs Abs: 1.4 10*3/uL (ref 0.7–4.0)
MCH: 30.6 pg (ref 26.0–34.0)
MCHC: 33.3 g/dL (ref 30.0–36.0)
MCV: 91.9 fL (ref 78.0–100.0)
Monocytes Absolute: 0.7 10*3/uL (ref 0.1–1.0)
Monocytes Relative: 10 % (ref 3–12)
Neutro Abs: 5 10*3/uL (ref 1.7–7.7)
Neutrophils Relative %: 67 % (ref 43–77)
Platelets: 191 10*3/uL (ref 150–400)
RBC: 3.59 MIL/uL — ABNORMAL LOW (ref 3.87–5.11)
RDW: 13.3 % (ref 11.5–15.5)
WBC: 7.4 10*3/uL (ref 4.0–10.5)

## 2012-02-11 LAB — BASIC METABOLIC PANEL
BUN: 36 mg/dL — ABNORMAL HIGH (ref 6–23)
CO2: 32 mEq/L (ref 19–32)
Calcium: 11 mg/dL — ABNORMAL HIGH (ref 8.4–10.5)
Chloride: 98 mEq/L (ref 96–112)
Creatinine, Ser: 2.12 mg/dL — ABNORMAL HIGH (ref 0.50–1.10)
GFR calc Af Amer: 25 mL/min — ABNORMAL LOW (ref >90)
GFR calc non Af Amer: 21 mL/min — ABNORMAL LOW (ref >90)
Glucose, Bld: 111 mg/dL — ABNORMAL HIGH (ref 70–99)
Potassium: 3.6 mEq/L (ref 3.5–5.1)
Sodium: 143 mEq/L (ref 135–145)

## 2012-02-11 LAB — URINE MICROSCOPIC-ADD ON

## 2012-02-11 LAB — TROPONIN I: Troponin I: <0.3 ng/mL (ref <0.30)

## 2012-02-11 MED ORDER — POTASSIUM CHLORIDE CRYS ER 20 MEQ PO TBCR
20.0000 meq | EXTENDED_RELEASE_TABLET | Freq: Every day | ORAL | Status: DC
  Administered 2012-02-12: 20 meq via ORAL
  Filled 2012-02-11: qty 1

## 2012-02-11 MED ORDER — SPIRONOLACTONE 25 MG PO TABS
25.0000 mg | ORAL_TABLET | Freq: Every day | ORAL | Status: DC
  Administered 2012-02-12 – 2012-02-14 (×3): 25 mg via ORAL
  Filled 2012-02-11 (×3): qty 1

## 2012-02-11 MED ORDER — HYDRALAZINE HCL 25 MG PO TABS
25.0000 mg | ORAL_TABLET | Freq: Three times a day (TID) | ORAL | Status: DC | PRN
  Filled 2012-02-11: qty 1

## 2012-02-11 MED ORDER — ONDANSETRON HCL 4 MG/2ML IJ SOLN: 4.0000 mg | Freq: Four times a day (QID) | INTRAMUSCULAR | Status: DC | PRN

## 2012-02-11 MED ORDER — HEPARIN SODIUM (PORCINE) 5000 UNIT/ML IJ SOLN
5000.0000 [IU] | Freq: Three times a day (TID) | INTRAMUSCULAR | Status: DC
  Administered 2012-02-12 – 2012-02-14 (×7): 5000 [IU] via SUBCUTANEOUS
  Filled 2012-02-11 (×10): qty 1

## 2012-02-11 MED ORDER — GI COCKTAIL ~~LOC~~
30.0000 mL | Freq: Once | ORAL | Status: DC
  Filled 2012-02-11: qty 30

## 2012-02-11 MED ORDER — ATORVASTATIN CALCIUM 40 MG PO TABS
40.0000 mg | ORAL_TABLET | Freq: Every day | ORAL | Status: DC
  Filled 2012-02-11: qty 1

## 2012-02-11 MED ORDER — SODIUM CHLORIDE 0.9 % IV SOLN: INTRAVENOUS | Status: DC

## 2012-02-11 MED ORDER — ASPIRIN EC 81 MG PO TBEC
81.0000 mg | DELAYED_RELEASE_TABLET | Freq: Every day | ORAL | Status: DC
  Administered 2012-02-12 – 2012-02-14 (×3): 81 mg via ORAL
  Filled 2012-02-11 (×3): qty 1

## 2012-02-11 MED ORDER — ACETAMINOPHEN 325 MG PO TABS
650.0000 mg | ORAL_TABLET | ORAL | Status: DC | PRN
  Administered 2012-02-13: 650 mg via ORAL
  Filled 2012-02-11: qty 2

## 2012-02-11 MED ORDER — SODIUM CHLORIDE 0.9 % IJ SOLN
3.0000 mL | Freq: Two times a day (BID) | INTRAMUSCULAR | Status: DC
  Administered 2012-02-12 – 2012-02-13 (×5): 3 mL via INTRAVENOUS

## 2012-02-11 MED ORDER — FUROSEMIDE 10 MG/ML IJ SOLN
40.0000 mg | Freq: Every day | INTRAMUSCULAR | Status: DC
  Administered 2012-02-12: 40 mg via INTRAVENOUS
  Filled 2012-02-11 (×2): qty 4

## 2012-02-11 MED ORDER — RAMIPRIL 10 MG PO CAPS
10.0000 mg | ORAL_CAPSULE | Freq: Every day | ORAL | Status: DC
  Filled 2012-02-11: qty 1

## 2012-02-11 MED ORDER — FUROSEMIDE 10 MG/ML IJ SOLN
40.0000 mg | Freq: Once | INTRAMUSCULAR | Status: AC
  Administered 2012-02-11: 40 mg via INTRAVENOUS
  Filled 2012-02-11: qty 4

## 2012-02-11 NOTE — ED Notes (Signed)
Called report to unit 4700.

## 2012-02-11 NOTE — H&P (Signed)
[name] History and Physical Service Pager: [phone]  Patient name: Jodi [name] Medical record number: [id number] Date of birth: [birth year] Age: 79 y.o. Gender: female  Primary Care Provider: [name], MD  Chief Complaint: SOB  Assessment and Plan: Jodi [name] is a 79 y.o. year old female with an extensive PMH including CAD s/p CABG x 2, HTN, HLD, and Combined systolic and diastolic CHF with EF of 123456 who presents with worsening SOB that began on 1/5.  Clincial picture consistent with acute CHF exacerbation.   1) Acute exacerbation of Chronic Combined systolic and diastolic CHF - Last EF was 35-40%. Elevated Pro-BNP of 8730 and Chest xray revealed Mild CHF with cardiomegaly, small bilateral pleural effusions and interstitial pulmonary edema.  Based on Medical record patient's dry weight is 158-160 lbs. - Will admit to Telemetry, FPTS, Attending Dr. McDiarmid - Diuresis with IV Lasix 40 mg daily (increase as needed for adequate diuresis, monitor Cr) - Daily Weights, Strict I/O's - Will continue patient's home Ramipril 10 mg and Spironolactone 25 mg daily - Holding Coreg due to Acute CHF and Bradycardia  - Will obtain 2D Echo during admission as last Echo was in 2012 - Also will obtain additional Troponin I given extensive cardiac history.  2) CAD s/p CABG - Aspirin 81 mg daily - Will continue statin - Lipitor 40 mg (substitute for Crestor) Last direct LDL in Sept 2013 was 79  3) HTN - ACEI and Spironolactone as above. - Hydralazine 25 mg Q8PRN for SBP >190 or DBP greater than 110. - Restart beta blocker as tolerated  4) HLD - Lipitor 40 mg daily  FEN/GI: Heart Healthy diet Prophylaxis: Heparin SQ Disposition: Pending clinical improvement Code Status: Will need to address with patient during admission  History of Present Illness:  Jodi [name] is a 79 y.o. year old female with an extensive PMH  including CAD s/p CABG x 2, HTN, HLD, and Combined systolic and diastolic CHF with EF of 123456 who presents with worsening SOB which began yesterday (1/5).   Patient reports that yesterday she developed increasing SOB and difficulty breathing.  This continued to worsen today making it "harder and harder to breath."  Patient also reports that she could not do her normal activities without sitting down to rest and catch her breath.  Patient found it extremely difficult to even make the bed today.  Patient denies any associated chest pain or cough, but did report that she had a cold last week.  She also reports that her Cardiologist recently added 3 blood pressure medications, but she is not aware of the names of these medications. She has scheduled follow-up this week.    [id number] palpitations, claudication symptoms, lightheadness, syncope, headache, rash, easy bruising, orthopnea, fevers, chills.  Does report some abdominal tightness.   Patient Active Problem List  Diagnosis  . HYPERCHOLESTEROLEMIA  . OTITIS MEDIA, CHRONIC  . HYPERTENSION, BENIGN SYSTEMIC  . CORONARY, ARTERIOSCLEROSIS  . CHF - EJECTION FRACTION < 50%  . SEBORRHEA  . OSTEOARTHRITIS, MULTI SITES  . INSOMNIA  . Splenic infarct  . Aortic stenosis  . Renal mass  . Fatigue  . Urinary tract infection   Past Medical History: Past Medical History  Diagnosis Date  . Myocardial infarction   . Coronary artery disease   . Hypertension   . Heart murmur   . Chronic kidney disease   . Aortic stenosis   . CHF (congestive heart failure)   .  Hypercholesteremia    Past Surgical History: Past Surgical History  Procedure Date  . Radical hysterectomy   . Coronary artery bypass graft 1995 and 2003  . Abdominal hysterectomy   . Eye surgery    Social History: History  Substance Use Topics  . Smoking status: Never Smoker   . Smokeless tobacco: Never Used  . Alcohol Use: No   Family History: Family History  Problem  Relation Age of Onset  . Coronary artery disease Mother   . Coronary artery disease Brother   . Stroke Mother    Allergies: Allergies  Allergen Reactions  . Sulfamethoxazole W-Trimethoprim Other (See Comments)    REACTION: shaking /chills   No current facility-administered medications on file prior to encounter.   Current Outpatient Prescriptions on File Prior to Encounter  Medication Sig Dispense Refill  . Ascorbic Acid (VITAMIN C) 500 MG tablet Take 500 mg by mouth daily.        Marland Kitchen aspirin 325 MG tablet Take 325 mg by mouth daily.        . beta carotene w/minerals (OCUVITE) tablet Take 1 tablet by mouth daily.      . Calcium Carbonate-Vitamin D (CALCARB 600/D) 600-400 MG-UNIT per tablet Take 1 tablet by mouth daily.       . carvedilol (COREG) 25 MG tablet Take 25 mg by mouth 2 (two) times daily.      . cholecalciferol (VITAMIN D) 1000 UNITS tablet Take 5,000 Units by mouth daily.      Marland Kitchen HYDROcodone-acetaminophen (VICODIN) 5-500 MG per tablet Take 1 tablet by mouth every 8 (eight) hours as needed for pain.  30 tablet  2  . Lecithin 1200 MG CAPS Take 1,200 mg by mouth daily.      . Magnesium-Zinc 133.33-5 MG TABS Take 1 tablet by mouth daily.      . Multiple Vitamin (MULTIVITAMIN) capsule Take 1 capsule by mouth daily.        . Omega-3 Fatty Acids (FISH OIL) 1200 MG CAPS Take 1,200 mg by mouth daily.      . ramipril (ALTACE) 10 MG capsule Take 10 mg by mouth daily.      . rosuvastatin (CRESTOR) 20 MG tablet Take 20 mg by mouth daily.      Marland Kitchen spironolactone (ALDACTONE) 25 MG tablet Take 1 tablet (25 mg total) by mouth daily.  30 tablet  3  . torsemide (DEMADEX) 20 MG tablet Take 20 mg by mouth every other day.      . vitamin B-12 (CYANOCOBALAMIN) 1000 MCG tablet Take 1,000 mcg by mouth daily.      . vitamin E 400 UNIT capsule Take 400 Units by mouth daily.         Review Of Systems: Per HPI.  Otherwise 12 point review of systems was performed and was unremarkable.  Physical Exam: BP  190/85  Pulse 59  Temp 97.9 F (36.6 C) (Oral)  Resp 16  SpO2 97%  Exam: General: well appearing elderly lady, resting comfortably in bed. NAD.  On  oxygen.Son at bedside HEENT: NCAT. Moist mucous membranes. Cardiovascular: Bradycardia. Regular Rhythm. 3/6 systolic ejection murmur loudest at R 2nd ICS. Respiratory: Normal WOB.  Bibasilar rales noted.  No wheezing or rhonchi noted. Abdomen: obese, soft, nontender, moderately distended. No palpable organomegaly.  Extremities: warm, well perfused. No appreciable LE or sacral edema. Skin: warm, dry, intact. No rash. Neuro: AO x 3. No focal deficits.  Labs and Imaging: CBC BMET   Lab 02/11/12 2020  WBC 7.4  HGB 11.0*  HCT 33.0*  PLT 191    Lab 02/11/12 2022  NA 143  K 3.6  CL 98  CO2 32  BUN 36*  CREATININE 2.12*  GLUCOSE 111*  CALCIUM 11.0*     BNP    Component Value Date/Time   PROBNP 8730.0* 02/11/2012 1946   Dg Chest 2 View 02/11/2012  *RADIOLOGY REPORT*  Clinical Data: History of CABG.  Hypertension.  Short of breath.  CHEST - 2 VIEW  Comparison: 11/28/2010.  Findings: Cardiomegaly.  Median sternotomy.  CABG.  Pulmonary vascular congestion and diffuse interstitial prominence is present. Small bilateral pleural effusions superimposed on chronic blunting of the costophrenic angles.  Interstitial pulmonary edema is present. Emphysema.  IMPRESSION: Mild CHF with cardiomegaly, small bilateral pleural effusions and interstitial pulmonary edema.  EKG: Sinus Bradycardia.  Thersa Salt, DO 02/11/2012, 9:45 PM  PGY-2 Addendum: I have seen and examined patient. I agree with Dr. Jonathon Jordan note above with noted changes. Kentavius Dettore M. Jarelly Rinck, M.D. 02/12/2012 4:44 AM

## 2012-02-11 NOTE — ED Notes (Addendum)
Onset one week ago head cold and one day ago developed shortness of breath on exertion productive cough white sputum continued today. Recently seen Doctor and changed blood pressure medications 1-1.5 weeks ago. Sinus brady first degree block per EMS. Denies pain. Ax4. History of multiple heart bypass and stents.

## 2012-02-11 NOTE — ED Provider Notes (Signed)
History     CSN: [id number]  Arrival date & time 02/11/12  [id number]   First MD Initiated Contact with Patient 02/11/12 1911      Chief Complaint  Patient presents with  . Shortness of Breath     HPI Pt was seen at 1940.   Per pt, c/o gradual onset and worsening of persistent SOB and cough for the past 1 week, worse since yesterday.  States cough is productive of "white" sputum.  SOB worsens on exertion and she has been unable to walk as far as her usual distances due to increasing SOB.  States yesterday she became "so SOB I couldn't finish making the bed."  Denies CP/palpitations, no fevers, no back pain, no abd pain, no N/V/D.     Past Medical History  Diagnosis Date  . Myocardial infarction   . Coronary artery disease   . Hypertension   . Heart murmur   . Chronic kidney disease   . Aortic stenosis   . CHF (congestive heart failure)   . Hypercholesteremia     Past Surgical History  Procedure Date  . Radical hysterectomy   . Coronary artery bypass graft 1995 and 2003  . Abdominal hysterectomy   . Eye surgery     Family History  Problem Relation Age of Onset  . Coronary artery disease Mother   . Coronary artery disease Brother   . Stroke Mother     History  Substance Use Topics  . Smoking status: Never Smoker   . Smokeless tobacco: Never Used  . Alcohol Use: No    Review of Systems ROS: Statement: All systems negative except as marked or noted in the HPI; Constitutional: Negative for fever and chills. ; ; Eyes: Negative for eye pain, redness and discharge. ; ; ENMT: Negative for ear pain, hoarseness, nasal congestion, sinus pressure and sore throat. ; ; Cardiovascular: Negative for chest pain, palpitations, diaphoresis, and peripheral edema. ; ; Respiratory: +cough, SOB. Negative for wheezing and stridor. ; ; Gastrointestinal: Negative for nausea, vomiting, diarrhea, abdominal pain, blood in stool, hematemesis, jaundice and rectal bleeding. . ; ; Genitourinary:  Negative for dysuria, flank pain and hematuria. ; ; Musculoskeletal: Negative for back pain and neck pain. Negative for swelling and trauma.; ; Skin: Negative for pruritus, rash, abrasions, blisters, bruising and skin lesion.; ; Neuro: Negative for headache, lightheadedness and neck stiffness. Negative for weakness, altered level of consciousness , altered mental status, extremity weakness, paresthesias, involuntary movement, seizure and syncope.     Allergies  Sulfamethoxazole w-trimethoprim  Home Medications   Current Outpatient Rx  Name  Route  Sig  Dispense  Refill  . VITAMIN C 500 MG PO TABS   Oral   Take 500 mg by mouth daily.           . ASPIRIN 325 MG PO TABS   Oral   Take 325 mg by mouth daily.           . OCUVITE PO TABS   Oral   Take 1 tablet by mouth daily.         Marland Kitchen CALCIUM CARBONATE-VITAMIN D 600-400 MG-UNIT PO TABS   Oral   Take 1 tablet by mouth daily.          Marland Kitchen CARVEDILOL 25 MG PO TABS   Oral   Take 25 mg by mouth 2 (two) times daily.         Marland Kitchen VITAMIN D 1000 UNITS PO TABS   Oral  Take 5,000 Units by mouth daily.         Marland Kitchen HYDROCODONE-ACETAMINOPHEN 5-500 MG PO TABS   Oral   Take 1 tablet by mouth every 8 (eight) hours as needed for pain.   30 tablet   2   . LECITHIN 1200 MG PO CAPS   Oral   Take 1,200 mg by mouth daily.         Marland Kitchen MAGNESIUM-ZINC 133.33-5 MG PO TABS   Oral   Take 1 tablet by mouth daily.         . MULTIVITAMINS PO CAPS   Oral   Take 1 capsule by mouth daily.           Marland Kitchen FISH OIL 1200 MG PO CAPS   Oral   Take 1,200 mg by mouth daily.         Marland Kitchen RAMIPRIL 10 MG PO CAPS   Oral   Take 10 mg by mouth daily.         Marland Kitchen ROSUVASTATIN CALCIUM 20 MG PO TABS   Oral   Take 20 mg by mouth daily.         Marland Kitchen SPIRONOLACTONE 25 MG PO TABS   Oral   Take 1 tablet (25 mg total) by mouth daily.   30 tablet   3   . TORSEMIDE 20 MG PO TABS   Oral   Take 20 mg by mouth every other day.         Marland Kitchen VITAMIN B-12  1000 MCG PO TABS   Oral   Take 1,000 mcg by mouth daily.         Marland Kitchen VITAMIN E 400 UNITS PO CAPS   Oral   Take 400 Units by mouth daily.           Marland Kitchen ZOLPIDEM TARTRATE 5 MG PO TABS   Oral   Take 5 mg by mouth at bedtime as needed. For sleep           BP 156/87  Pulse 58  SpO2 97%  Physical Exam 1945: Physical examination:  Nursing notes reviewed; Vital signs and O2 SAT reviewed;  Constitutional: Well developed, Well nourished, Well hydrated, In no acute distress; Head:  Normocephalic, atraumatic; Eyes: EOMI, PERRL, No scleral icterus; ENMT: Mouth and pharynx normal, Mucous membranes moist; Neck: Supple, Full range of motion, No lymphadenopathy; Cardiovascular: Regular rate and rhythm, No gallop; Respiratory: Breath sounds coarse & equal bilaterally, with bibasilar crackles. No wheezing. Speaking full sentences with ease, Normal respiratory effort/excursion; Chest: Nontender, Movement normal; Abdomen: Soft, Nontender, Nondistended, Normal bowel sounds; Genitourinary: No CVA tenderness; Extremities: Pulses normal, No tenderness, No edema, No calf edema or asymmetry.; Neuro: AA&Ox3, Major CN grossly intact.  Speech clear. No gross focal motor or sensory deficits in extremities.; Skin: Color normal, Warm, Dry.   ED Course  Procedures    MDM  MDM Reviewed: previous chart, nursing note and vitals Reviewed previous: ECG and labs Interpretation: labs, ECG and x-ray      Date: 02/11/2012  Rate: 57  Rhythm: normal sinus rhythm  QRS Axis: normal  Intervals: normal  ST/T Wave abnormalities: nonspecific ST/T changes  Conduction Disutrbances:nonspecific intraventricular conduction delay, TWI leads I and aVL  Narrative Interpretation:   Old EKG Reviewed: unchanged; no significant changes from previous EKG dated 11/27/2010.   Results for orders placed during the hospital encounter of 02/11/12  CBC WITH DIFFERENTIAL      Component Value Range   WBC 7.4  4.0 - 10.5  K/uL   RBC 3.59  (*) 3.87 - 5.11 MIL/uL   Hemoglobin 11.0 (*) 12.0 - 15.0 g/dL   HCT 33.0 (*) 36.0 - 46.0 %   MCV 91.9  78.0 - 100.0 fL   MCH 30.6  26.0 - 34.0 pg   MCHC 33.3  30.0 - 36.0 g/dL   RDW 13.3  11.5 - 15.5 %   Platelets 191  150 - 400 K/uL   Neutrophils Relative 67  43 - 77 %   Neutro Abs 5.0  1.7 - 7.7 K/uL   Lymphocytes Relative 19  12 - 46 %   Lymphs Abs 1.4  0.7 - 4.0 K/uL   Monocytes Relative 10  3 - 12 %   Monocytes Absolute 0.7  0.1 - 1.0 K/uL   Eosinophils Relative 4  0 - 5 %   Eosinophils Absolute 0.3  0.0 - 0.7 K/uL   Basophils Relative 1  0 - 1 %   Basophils Absolute 0.1  0.0 - 0.1 K/uL  TROPONIN I      Component Value Range   Troponin I <0.30  <0.30 ng/mL  PRO B NATRIURETIC PEPTIDE      Component Value Range   Pro B Natriuretic peptide (BNP) 8730.0 (*) 0 - 450 pg/mL  BASIC METABOLIC PANEL      Component Value Range   Sodium 143  135 - 145 mEq/L   Potassium 3.6  3.5 - 5.1 mEq/L   Chloride 98  96 - 112 mEq/L   CO2 32  19 - 32 mEq/L   Glucose, Bld 111 (*) 70 - 99 mg/dL   BUN 36 (*) 6 - 23 mg/dL   Creatinine, Ser 2.12 (*) 0.50 - 1.10 mg/dL   Calcium 11.0 (*) 8.4 - 10.5 mg/dL   GFR calc non Af Amer 21 (*) >90 mL/min   GFR calc Af Amer 25 (*) >90 mL/min   Dg Chest 2 View 02/11/2012  *RADIOLOGY REPORT*  Clinical Data: History of CABG.  Hypertension.  Short of breath.  CHEST - 2 VIEW  Comparison: 11/28/2010.  Findings: Cardiomegaly.  Median sternotomy.  CABG.  Pulmonary vascular congestion and diffuse interstitial prominence is present. Small bilateral pleural effusions superimposed on chronic blunting of the costophrenic angles.  Interstitial pulmonary edema is present. Emphysema.  IMPRESSION: Mild CHF with cardiomegaly, small bilateral pleural effusions and interstitial pulmonary edema.   Original Report Authenticated By: Dereck Ligas, M.D.     Results for MERIYAH, RENALDO (MRN [id number]) as of 02/11/2012 21:45  Ref. Range 11/28/2010 05:55 11/29/2010 03:34 05/14/2011 09:16  10/22/2011 14:48 12/21/2011 11:40 02/11/2012 20:22  BUN Latest Range: 6-23 mg/dL 24 (H) 24 (H) 31 (H) 27 (H) 28 (H) 36 (H)  Creatinine Latest Range: 0.50-1.10 mg/dL 1.18 (H) 1.23 (H) 1.37 (H) 1.24 (H) 1.28 (H) 2.12 (H)    Results for KYLEY, KOVACEVICH (MRN [id number]) as of 02/11/2012 21:45  Ref. Range 11/28/2010 05:55 11/29/2010 03:34 10/22/2011 14:48 12/21/2011 11:40 02/11/2012 20:20  Hemoglobin Latest Range: 12.0-15.0 g/dL 11.0 (L) 10.5 (L) 12.4 12.3 11.0 (L)  HCT Latest Range: 36.0-46.0 % 32.2 (L) 31.7 (L) 35.5 (L) 35.8 (L) 33.0 (L)    2140:  BNP elevated with +CHF on CXR; will dose IV lasix.  Pt ambulated to bathroom with Sats decreasing to 83% R/A and pt c/o increasing SOB.  Dx and testing d/w pt and family.  Questions answered.  Verb understanding, agreeable to admit.  T/C to [facility] Resident, case discussed, including:  HPI, pertinent PM/SHx,  VS/PE, dx testing, ED course and treatment:  Agreeable to admit, requests they will come to ED for eval.     Alfonzo Feller, DO 02/13/12 1036

## 2012-02-12 ENCOUNTER — Encounter (HOSPITAL_COMMUNITY): Payer: Self-pay | Admitting: *Deleted

## 2012-02-12 DIAGNOSIS — I5040 Unspecified combined systolic (congestive) and diastolic (congestive) heart failure: Secondary | ICD-10-CM

## 2012-02-12 LAB — BASIC METABOLIC PANEL
BUN: 34 mg/dL — ABNORMAL HIGH (ref 6–23)
CO2: 33 mEq/L — ABNORMAL HIGH (ref 19–32)
Calcium: 9.9 mg/dL (ref 8.4–10.5)
Chloride: 95 mEq/L — ABNORMAL LOW (ref 96–112)
Creatinine, Ser: 1.96 mg/dL — ABNORMAL HIGH (ref 0.50–1.10)
GFR calc Af Amer: 27 mL/min — ABNORMAL LOW (ref >90)
GFR calc non Af Amer: 23 mL/min — ABNORMAL LOW (ref >90)
Glucose, Bld: 94 mg/dL (ref 70–99)
Potassium: 3.2 mEq/L — ABNORMAL LOW (ref 3.5–5.1)
Sodium: 141 mEq/L (ref 135–145)

## 2012-02-12 LAB — TROPONIN I: Troponin I: <0.3 ng/mL (ref <0.30)

## 2012-02-12 LAB — CREATININE, URINE, RANDOM: Creatinine, Urine: 49.75 mg/dL

## 2012-02-12 MED ORDER — HYDRALAZINE HCL 25 MG PO TABS
25.0000 mg | ORAL_TABLET | Freq: Three times a day (TID) | ORAL | Status: DC | PRN
  Filled 2012-02-12: qty 1

## 2012-02-12 MED ORDER — CARVEDILOL 6.25 MG PO TABS
6.2500 mg | ORAL_TABLET | Freq: Two times a day (BID) | ORAL | Status: DC
  Administered 2012-02-13 – 2012-02-14 (×3): 6.25 mg via ORAL
  Filled 2012-02-12 (×5): qty 1

## 2012-02-12 MED ORDER — ZOLPIDEM TARTRATE 5 MG PO TABS
5.0000 mg | ORAL_TABLET | Freq: Every evening | ORAL | Status: DC | PRN
  Administered 2012-02-12: 5 mg via ORAL
  Filled 2012-02-12: qty 1

## 2012-02-12 MED ORDER — ROSUVASTATIN CALCIUM 20 MG PO TABS
20.0000 mg | ORAL_TABLET | Freq: Every day | ORAL | Status: DC
  Administered 2012-02-12 – 2012-02-13 (×2): 20 mg via ORAL
  Filled 2012-02-12 (×3): qty 1

## 2012-02-12 MED ORDER — POTASSIUM CHLORIDE CRYS ER 20 MEQ PO TBCR
40.0000 meq | EXTENDED_RELEASE_TABLET | Freq: Two times a day (BID) | ORAL | Status: AC
  Administered 2012-02-12 – 2012-02-13 (×2): 40 meq via ORAL
  Filled 2012-02-12 (×2): qty 2

## 2012-02-12 MED ORDER — AMLODIPINE BESYLATE 5 MG PO TABS
5.0000 mg | ORAL_TABLET | Freq: Every day | ORAL | Status: DC
  Administered 2012-02-13 – 2012-02-14 (×2): 5 mg via ORAL
  Filled 2012-02-12 (×2): qty 1

## 2012-02-12 NOTE — Consult Note (Signed)
CARDIOLOGY CONSULT NOTE  Patient ID: Jodi [name] MRN: [id number] DOB/AGE: 1933-01-08 79 y.o.  Admit date: 02/11/2012 Referring Physician  Sherren Mocha McDiarmit, MD Primary Physician:  [name], MD Reason for Consultation  CHF and aortic stenosis  HPI: Patient is a very active 79 year old female who presents to me for followup of shortness of breath and dyspnea on exertion and aortic stenosis. She recently undergone an echocardiogram which was done recently for evaluation of dyspnea and reveals moderately severe aortic stenosis. She had been doing well and saw me in the office on12/5/13 and had reduced the dose of coreg from 25 mg BID to 12.5 mg BID due to bradycardia and added Amlodipine for her hypertension. She has note started amlodipine yet, but did reduce the dose of Coreg. Had been doing well until 6 days ago over the past weekend, developed worsening dyspnea. Presented to the hospital with mild to moderate decompensated heart failure. Presently feels well and doing well and states she is almost back to baseline. Orthopnea has improved.   Past Medical History  Diagnosis Date  . Myocardial infarction   . Coronary artery disease   . Hypertension   . Heart murmur   . Chronic kidney disease   . Aortic stenosis   . CHF (congestive heart failure)   . Hypercholesteremia   . Shortness of breath   . Stroke      Past Surgical History  Procedure Date  . Radical hysterectomy   . Coronary artery bypass graft 1995 and 2003  . Abdominal hysterectomy   . Eye surgery   . Cardiac catheterization      Family History  Problem Relation Age of Onset  . Coronary artery disease Mother   . Coronary artery disease Brother   . Stroke Mother      Social History: History   Social History  . Marital Status: Widowed    Spouse Name: N/A    Number of Children: N/A  . Years of Education: N/A   Occupational History  . retired    Social History Main Topics  . Smoking status: Never  Smoker   . Smokeless tobacco: Never Used  . Alcohol Use: No  . Drug Use: No  . Sexually Active: Not Currently   Other Topics Concern  . Not on file   Social History Narrative   Lives alone.  Has son     Prescriptions prior to admission  Medication Sig Dispense Refill  . Ascorbic Acid (VITAMIN C) 500 MG tablet Take 500 mg by mouth daily.        Marland Kitchen aspirin 325 MG tablet Take 325 mg by mouth daily.        . beta carotene w/minerals (OCUVITE) tablet Take 1 tablet by mouth daily.      . Calcium Carbonate-Vitamin D (CALCARB 600/D) 600-400 MG-UNIT per tablet Take 1 tablet by mouth daily.       . carvedilol (COREG) 25 MG tablet Take 25 mg by mouth 2 (two) times daily.      . cholecalciferol (VITAMIN D) 1000 UNITS tablet Take 5,000 Units by mouth daily.      Marland Kitchen HYDROcodone-acetaminophen (VICODIN) 5-500 MG per tablet Take 1 tablet by mouth every 8 (eight) hours as needed for pain.  30 tablet  2  . Lecithin 1200 MG CAPS Take 1,200 mg by mouth daily.      . Magnesium-Zinc 133.33-5 MG TABS Take 1 tablet by mouth daily.      . Multiple Vitamin (MULTIVITAMIN) capsule  Take 1 capsule by mouth daily.        . Omega-3 Fatty Acids (FISH OIL) 1200 MG CAPS Take 1,200 mg by mouth daily.      . ramipril (ALTACE) 10 MG capsule Take 10 mg by mouth daily.      . rosuvastatin (CRESTOR) 20 MG tablet Take 20 mg by mouth daily.      Marland Kitchen spironolactone (ALDACTONE) 25 MG tablet Take 1 tablet (25 mg total) by mouth daily.  30 tablet  3  . torsemide (DEMADEX) 20 MG tablet Take 20 mg by mouth every other day.      . vitamin B-12 (CYANOCOBALAMIN) 1000 MCG tablet Take 1,000 mcg by mouth daily.      . vitamin E 400 UNIT capsule Take 400 Units by mouth daily.        Marland Kitchen zolpidem (AMBIEN) 5 MG tablet Take 5 mg by mouth at bedtime as needed. For sleep        Scheduled Meds:   . aspirin EC  81 mg Oral Daily  . furosemide  40 mg Intravenous Daily  . heparin  5,000 Units Subcutaneous Q8H  . potassium chloride  40 mEq Oral BID    . rosuvastatin  20 mg Oral q1800  . sodium chloride  3 mL Intravenous Q12H  . spironolactone  25 mg Oral Daily   Continuous Infusions:  PRN Meds:.acetaminophen, hydrALAZINE, ondansetron (ZOFRAN) IV, zolpidem  ROS: General: no fevers/chills/night sweats Eyes: no blurry vision, diplopia, or amaurosis ENT: no sore throat or hearing loss CV: no edema or palpitations GI: no abdominal pain, nausea, vomiting, diarrhea, or constipation GU: no dysuria, frequency, or hematuria Skin: no rash Neuro: no headache, numbness, tingling, or weakness of extremities Musculoskeletal: no joint pain or swelling Heme: no bleeding, DVT, or easy bruising Endo: no polydipsia or polyuria    Physical Exam: Blood pressure 122/58, pulse 52, temperature 97.6 F (36.4 C), temperature source Oral, resp. rate 18, height 5\' 3"  (1.6 m), weight 73.1 kg (161 lb 2.5 oz), SpO2 97.00%.   GENERAL: Moderately built and normal body habitus, who is in no acute distress. Appears much younger than stated age. Alert and oriented x 3. Appears stated age. Very pleasant.  HEENT: normal limits. No JVD.   CARDIAC EXAM: S1 normal,  S2 muffled no gallop, II/VI SEM noted in apex and right sternal border conducted to the clavicle.   CHEST EXAM: No tenderness of chest wall.   LUNGS: Clear to percuss and auscultate. No rales or ronchi.   MUSCULOSKELETAL EXAM: Intact with full range of motion in all 4 extremities.   NEUROLOGIC EXAM: Grossly intact without any focal deficits.   EXTREMITY: No edema.   VASCULAR EXAM: No skin breakdown. Carotids bilateral bruit left louder than the right. Normal amplitude. Extremities: Femoral pulse normal. Popliteal pulse normal ; Pedal pulse normal. Otherwise no bruit. No prominent pulse felt in the abdomen. Superficial varicose veins noted without edema or tenderness of the calf or legs.  Labs:  BNP  Basename 02/11/12 1946  PROBNP 8730.0*      Lab  Results  Component Value Date   WBC 7.4 02/11/2012   HGB 11.0* 02/11/2012   HCT 33.0* 02/11/2012   MCV 91.9 02/11/2012   PLT 191 02/11/2012    Lab 02/12/12 0625  NA 141  K 3.2*  CL 95*  CO2 33*  BUN 34*  CREATININE 1.96*  CALCIUM 9.9  PROT --  BILITOT --  ALKPHOS --  ALT --  AST --  GLUCOSE 94   Lab Results  Component Value Date   TROPONINI <0.30 02/12/2012    Lipid Panel     Component Value Date/Time   CHOL 206* 05/14/2011 0916   TRIG 206* 05/14/2011 0916   HDL 50 05/14/2011 0916   CHOLHDL 4.1 05/14/2011 0916   VLDL 41* 05/14/2011 0916   LDLCALC 115* 05/14/2011 0916    EKG:NSR @ 57/min. Normal intervals. Normal axis. LVH. No ischemia.   Radiology: Dg Chest 2 View  02/11/2012  *RADIOLOGY REPORT*  Clinical Data: History of CABG.  Hypertension.  Short of breath.  CHEST - 2 VIEW  Comparison: 11/28/2010.  Findings: Cardiomegaly.  Median sternotomy.  CABG.  Pulmonary vascular congestion and diffuse interstitial prominence is present. Small bilateral pleural effusions superimposed on chronic blunting of the costophrenic angles.  Interstitial pulmonary edema is present. Emphysema.  IMPRESSION: Mild CHF with cardiomegaly, small bilateral pleural effusions and interstitial pulmonary edema.   Original Report Authenticated By: Dereck Ligas, M.D.    Scheduled Meds:   . aspirin EC  81 mg Oral Daily  . furosemide  40 mg Intravenous Daily  . heparin  5,000 Units Subcutaneous Q8H  . potassium chloride  40 mEq Oral BID  . rosuvastatin  20 mg Oral q1800  . sodium chloride  3 mL Intravenous Q12H  . spironolactone  25 mg Oral Daily   Continuous Infusions:  PRN Meds:.acetaminophen, hydrALAZINE, ondansetron (ZOFRAN) IV, zolpidem   ASSESSMENT AND PLAN:  1. Acute on Chronic systolic and diastolic heart failure. Echo in our office 12/31/2011: Left ventricular cavity is normal in size. Moderate concentric hypertrophy. Moderately decreased systolic global function. Calculated EF 44%. Left atrial cavity is  moderately dilated. Moderate to severe aortic stenosis. Peak aortic gradient is 54.4, mean gradient 27.9 mm of Hg. Moderate aortic valve leaflet calcification. Aortic valve leaflet mobility is moderately restricted. No change since echo 06/21/10. 2. Moderate to severe aortic stenosis. 3. CAD S/P CABG: CABG in 1994. Stents in 2002. H/O Redo CABG 06/05/01 consisting of a patent LIMA to the LAD, with free RIMA to RI, SVG to OM1, SVG to PL A, SVG to ramus intermediate branch Dr. [name]. 4. Hypertension. 5. Acute renal insufficiency 6. Hyperlipidemia- mixed  Rec:  I discussed with the patient that she will need TEE and also L+R heart cath and possible AV replacement. However, patient does not want any surgical interventions at this time. Acute decompensation in a patient who was stable suggests that the AS is the culprit and underlying progression of CAD could also precipitate this.  I have discussed with her regarding TAVR (percutaneous AV replacement) and she seems interested.  Restart Coreg for now and hold off on ACEi (altace) until renal failure resolved. She is on Spironolactone, but S. Cr decreasing, hence continue this for now. BP is fluctuating and may need amlodipine and avoid hydralazine due to severe AS. If stable can be discharged and I will set up TEE as outpatient and also a stress test, unless she decides on surgery, then will proceed with L+R heart cath when S. Cr stable.     Laverda Page, MD 02/12/2012, 9:35 PM [facility] [place] Pager: [phone] Office: [phone] If no answer Cell [phone]

## 2012-02-12 NOTE — Progress Notes (Signed)
[facility] Attending Note Patient's case reviewed with resident team, I agree with Dr Thea Gist assess/plan as per his progress note for today.  Dalbert Mayotte, MD

## 2012-02-12 NOTE — Progress Notes (Signed)
PCP Physician She is feeling better today although quite bradycardic Appreciate the care by our in patient team. She had a recent echo and medication adjustments at Dr Irven Shelling office. Will await his input

## 2012-02-12 NOTE — H&P (Signed)
I have seen and examined this patient. I have discussed with Dr (s) Lacinda Axon and  Hayden Lake.  I agree with their findings and plans as documented in their admission notes.  Acute Issues 1. Acute Decompensated Heart Failure - History of heart failure with reduced ejection fraction. Echo 11/29/10 showed LVH with estimated EF% 35-40%. - History of moderate to severe Aortic Stenosis Echo 11/29/10 showed Transvalve gradient 24 mmHG and Aortic Valve area ~0.9 cm^2.  2. Acute Renal Injury on CKD - Creatinine 2.12 on admit. 1.3 on 12/21/11) - Differential includes :prerenal (poor cardiac output/venous congestion of splanchnic systmen or hypovolemia), UTI, medication (ramipril), or obstruction.  Plan - Pt symptomatically improving with aggressive diuresis. - consult Dr Einar Gip for further diagnosis and treatment recommendations.  - Check fractional excretion of urea, urinalysis, and post void residual - Hold ramipril for now. - Monitor Cr during aggressive diuresis therapy. - Avoid overdiuresis in patient with Aortic stenosis as they are sensitive to excess decline in pre-load.

## 2012-02-12 NOTE — Evaluation (Signed)
Physical Therapy Evaluation Patient Details Name: Jodi [name] MRN: [id number] DOB: [birth year] Today's Date: 02/12/2012 Time: [id number] PT Time Calculation (min): 22 min  PT Assessment / Plan / Recommendation Clinical Impression  Admitted for SOB in the setting of CHF. Presents to PT today with decreased activity tolerance limiting independence. Will benefit physical therapy in the acute setting to maximize activity tolerance for safe d/c home.     PT Assessment  Patient needs continued PT services    Follow Up Recommendations  Home health PT    Does the patient have the potential to tolerate intense rehabilitation      Barriers to Discharge        Equipment Recommendations  None recommended by PT    Recommendations for Other Services     Frequency Min 3X/week    Precautions / Restrictions Precautions Precautions: None Restrictions Weight Bearing Restrictions: No   Pertinent Vitals/Pain 2 liters [place], SaO2 97% before and after ambulation      Mobility  Bed Mobility Bed Mobility: Supine to Sit;Sit to Supine Supine to Sit: 6: Modified independent (Device/Increase time) Sit to Supine: 5: Supervision;HOB elevated (30 degrees) Details for Bed Mobility Assistance: verbal cues for efficiency of movement Transfers Transfers: Sit to Stand;Stand to Sit Sit to Stand: 5: Supervision;With upper extremity assist;From bed Stand to Sit: 5: Supervision;With upper extremity assist;To bed Details for Transfer Assistance: supervision for safety Ambulation/Gait Ambulation/Gait Assistance: 5: Supervision;4: Min guard Ambulation Distance (Feet): 300 Feet Assistive device: None Ambulation/Gait Assistance Details: gaurding for safety as pt with slight instability with increased lateral sway, able to self correct Gait Pattern: Decreased stride length Gait velocity: decreased Stairs: No              PT Diagnosis: Abnormality of gait  PT Problem List: Decreased activity  tolerance PT Treatment Interventions: Gait training;Functional mobility training;Therapeutic activities;Therapeutic exercise;Balance training;Patient/family education   PT Goals Acute Rehab PT Goals PT Goal Formulation: With patient Time For Goal Achievement: 02/19/12 Potential to Achieve Goals: Good Pt will go Sit to Supine/Side: Independently;with HOB 0 degrees PT Goal: Sit to Supine/Side - Progress: Goal set today Pt will go Sit to Stand: Independently PT Goal: Sit to Stand - Progress: Goal set today Pt will go Stand to Sit: Independently PT Goal: Stand to Sit - Progress: Goal set today Pt will Transfer Bed to Chair/Chair to Bed: Independently PT Transfer Goal: Bed to Chair/Chair to Bed - Progress: Goal set today Pt will Ambulate: >150 feet;Independently PT Goal: Ambulate - Progress: Goal set today Pt will Perform Home Exercise Program: Independently PT Goal: Perform Home Exercise Program - Progress: Goal set today  Visit Information  Last PT Received On: 02/12/12 Assistance Needed: +1    Subjective Data  Subjective: I get short of breath when I try to get adjusted in the bed.    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 for tasks assessed Right Lower Extremity Assessment RLE ROM/Strength/Tone: [facility] for tasks assessed Left Lower Extremity Assessment LLE ROM/Strength/Tone: [facility] for tasks assessed   Balance    End of Session PT - End of Session Equipment Utilized During Treatment: Gait belt Activity Tolerance: Patient tolerated treatment well Patient left: in bed;with call bell/phone within reach Nurse Communication: Mobility status  GP     [place] 02/12/2012, 1:45 PM

## 2012-02-12 NOTE — Progress Notes (Signed)
Daily Progress Note  Family Medicine Resident Pager [phone]  Patient name: Jodi [name] Medical record number: [id number] Date of birth: 11/16/1932 Age: 79 y.o. Gender: female  Overview: 79 year old F with CHF (EF 35-40% in 2012) and aortic stenosis who presents with dyspnea on exertion worsening over several months duration, who was given mild diuresis and now appears euvolemic.   Subjective: No acute events overnight.  Pt reports that her breathing is improved, however she is still experiencing mild dyspnea with exertion such as walking to the bathroom. No symptoms at rest.   Objective: Vital signs in last 24 hours: Temp:  [97.5 F (36.4 C)-97.9 F (36.6 C)] 97.9 F (36.6 C) (01/07 0530) Pulse Rate:  [44-86] 48  (01/07 0747) Resp:  [13-25] 16  (01/07 0530) BP: (114-190)/(57-156) 164/64 mmHg (01/07 0747) SpO2:  [96 %-99 %] 99 % (01/07 0530) FiO2 (%):  [2 %-28 %] 28 % (01/06 2216) Weight:  [161 lb 2.5 oz (73.1 kg)] 161 lb 2.5 oz (73.1 kg) (01/06 2357) Weight change:  Last BM Date: 02/11/12   Intake/Output Summary (Last 24 hours) at 02/12/12 1518 Last data filed at 02/12/12 1322  Gross per 24 hour  Intake    606 ml  Output   1500 ml  Net   -894 ml     Gen: elderly white female, non ill appearing, pleasant and conversant HEENT: NCAT, PERRLA, nasal cannula in place, no JVD CV: 4/6 systolic ejection murmur, no HJR Pulm: CTA_B, NO RALES Abd: soft, NDNT, no anasarca Extremities: no edema   Lab Results:  Basename 02/11/12 2020  WBC 7.4  HGB 11.0*  HCT 33.0*  PLT 191   BMET  Basename 02/12/12 0625 02/11/12 2022  NA 141 143  K 3.2* 3.6  CL 95* 98  CO2 33* 32  GLUCOSE 94 111*  BUN 34* 36*  CREATININE 1.96* 2.12*  CALCIUM 9.9 11.0*   Cardiac Enzymes:  Lab 02/12/12 0625 02/11/12 1946  CKTOTAL -- --  CKMB -- --  CKMBINDEX -- --  TROPONINI <0.30 <0.30     Studies/Results: Dg Chest 2 View  02/11/2012  *RADIOLOGY REPORT*  Clinical Data: History of  CABG.  Hypertension.  Short of breath.  CHEST - 2 VIEW  Comparison: 11/28/2010.  Findings: Cardiomegaly.  Median sternotomy.  CABG.  Pulmonary vascular congestion and diffuse interstitial prominence is present. Small bilateral pleural effusions superimposed on chronic blunting of the costophrenic angles.  Interstitial pulmonary edema is present. Emphysema.  IMPRESSION: Mild CHF with cardiomegaly, small bilateral pleural effusions and interstitial pulmonary edema.   Original Report Authenticated By: Dereck Ligas, M.D.     Medications:  Scheduled:   . aspirin EC  81 mg Oral Daily  . atorvastatin  40 mg Oral q1800  . furosemide  40 mg Intravenous Daily  . heparin  5,000 Units Subcutaneous Q8H  . potassium chloride  40 mEq Oral BID  . sodium chloride  3 mL Intravenous Q12H  . spironolactone  25 mg Oral Daily   Continuous:  [id number], hydrALAZINE, ondansetron (ZOFRAN) IV, zolpidem  Assessment/Plan: MICHALE LAWWILL is a 79 y.o. year old female with an extensive PMH including CAD s/p CABG x 2, HTN, HLD, and Combined systolic and diastolic CHF with EF of [id number] who presents with subacute shortness of breath that has been worsening over several months. Euvolemic today and still has SOB, so I'm concerned this is due to aortic stenosis.   # Acute exacerbation of Chronic Combined systolic and diastolic CHF -  Last EF was 35-40%. Elevated Pro-BNP of 8730 and Chest xray revealed Mild CHF with cardiomegaly, small bilateral pleural effusions and interstitial pulmonary edema.Dry weight is 158-160 lbs.; Trop neg x 2; dr. Einar Gip is cardiologist  - Does not appears to have any extra fluid today, so keep Lasix at 40 - replete K+  - No IVF - Spironolactone 25 mg daily - Daily Weights, Strict I/O's  - Holding Coreg due Bradycardia, and ACE-I due to AKI - Consult Dr. Einar Gip  # AKI - Creat 2.12 at admission, 1.3 baseline; BUN/Cr < 20, so possible intrinsic renal from med or progression or pre-renal  disease - minimal improvement  - hold home ramipril 2/2 AKI - Check FeUrea  # CAD s/p CABG  - Aspirin 81 mg daily  - Will continue statin - Lipitor 40 mg  # HTN  - Spironolactone as above.  - Hydralazine 25 mg Q8PRN for SBP >190 or DBP greater than 110.  - Restart beta blocker as tolerated   4) HLD  - Lipitor 40 mg daily   FEN/GI: Heart Healthy diet  Prophylaxis: Heparin SQ  Disposition: Pending clinical improvement Code Status: Will need to address with patient during admission     LOS: 1 day   Marena Chancy. Maricela Bo, MD, [place] 02/12/2012, 3:27 PM Family Medicine Resident, PGY-2 [phone] pager

## 2012-02-12 NOTE — Progress Notes (Addendum)
Patient's heart rate decreased to the 30s. Patient was asymptomatic at the time. Patient was sleeping. No complaints and no signs or symptoms of distress or discomfort. BP is 164/64 and HR is now 48. MD notified. No new orders given at this time. Will continue to monitor patient for further changes in condition.

## 2012-02-13 ENCOUNTER — Inpatient Hospital Stay (HOSPITAL_COMMUNITY): Payer: PRIVATE HEALTH INSURANCE

## 2012-02-13 LAB — URINE CULTURE: Colony Count: 25000

## 2012-02-13 LAB — BASIC METABOLIC PANEL
BUN: 38 mg/dL — ABNORMAL HIGH (ref 6–23)
CO2: 31 mEq/L (ref 19–32)
Calcium: 9.5 mg/dL (ref 8.4–10.5)
Chloride: 95 mEq/L — ABNORMAL LOW (ref 96–112)
Creatinine, Ser: 2 mg/dL — ABNORMAL HIGH (ref 0.50–1.10)
GFR calc Af Amer: 27 mL/min — ABNORMAL LOW (ref >90)
GFR calc non Af Amer: 23 mL/min — ABNORMAL LOW (ref >90)
Glucose, Bld: 93 mg/dL (ref 70–99)
Potassium: 3.5 mEq/L (ref 3.5–5.1)
Sodium: 137 mEq/L (ref 135–145)

## 2012-02-13 LAB — UREA NITROGEN, URINE: Urea Nitrogen, Ur: 361 mg/dL

## 2012-02-13 MED ORDER — AMLODIPINE BESYLATE 5 MG PO TABS: 5.0000 mg | ORAL_TABLET | Freq: Every day | ORAL | Status: DC

## 2012-02-13 MED ORDER — CARVEDILOL 6.25 MG PO TABS: 6.2500 mg | ORAL_TABLET | Freq: Two times a day (BID) | ORAL | Status: DC

## 2012-02-13 MED ORDER — FUROSEMIDE 40 MG PO TABS
40.0000 mg | ORAL_TABLET | Freq: Every day | ORAL | Status: DC
  Administered 2012-02-13: 40 mg via ORAL
  Filled 2012-02-13: qty 1

## 2012-02-13 MED ORDER — POTASSIUM CHLORIDE CRYS ER 20 MEQ PO TBCR
20.0000 meq | EXTENDED_RELEASE_TABLET | Freq: Two times a day (BID) | ORAL | Status: DC
  Administered 2012-02-13 – 2012-02-14 (×2): 20 meq via ORAL
  Filled 2012-02-13 (×3): qty 1

## 2012-02-13 NOTE — Progress Notes (Addendum)
Blood pressure 161/89, pulse 54. Dr. Einar Gip notified of BP &  bradycardia okay to give scheduled coreg per MD. Darreld Mclean [facility]

## 2012-02-13 NOTE — Progress Notes (Signed)
Patient evaluated for long-term disease management services with [facility]. Patient will receive a post discharge transition of care call and monthly home visits for assessments and for education. Spoke with Mrs Machida at bedside to explain and discuss [facility] services and how these services can benefit her. Consents signed at bedside. Explained how [facility] will not take the place of home health services or any services arranged by inpatient case manager or social work. [facility] will provide additional layer of support to help keep patient healthy at home. Mrs Bodor was appreciative of visit. Made inpatient case manager aware that [facility] will follow.  Marthenia Rolling, MSN-Ed, RN,BSN  [facility] Liaison [phone]

## 2012-02-13 NOTE — Progress Notes (Addendum)
Patient ID: Jodi [name], female   DOB: [birth year], 79 y.o.   MRN: [id number] Jodi [name] is a 79 y.o. female with significant PMH including CAD with CABG x 2, systolic and diastolic CHF, HTN and renal insufficiency who was admitted on 02/11/12 for worsening dyspnea on exertion and concern for acute on chronic CHF exacerbation.   Principal Problem:  *Acute on chronic systolic CHF (congestive heart failure) Active Problems:  HYPERTENSION, BENIGN SYSTEMIC  CORONARY, ARTERIOSCLEROSIS  Fatigue   Subjective No acute events overnight.  Pt reports stable dyspnea with exertion, unchanged from baseline over the summer.  She has not had increased trouble breathing on room air since yesterday afternoon.  Feels that she is okay to go home and follow up with Dr. Einar Gip as outpatient.  Objective Vitals: BP 161/89, P 54, T 98.2, RR 18, Wt 158 lb 15.2 oz, SpO2 94.00%. I&O:   Physical Exam  General:  Well-appearing, sitting up in bed  Neck: no JVD present  CV: Normal S1, S2; III/VI systolic murmur at L sternal border  Resp: Lungs clear bilaterally, no crackles   Abd: soft, non distended  LE:  No edema bilaterally  Labs: - Creatinine 2.00 increased from 1.96 on 02/12/12  Meds: . acetaminophen (TYLENOL) tablet 650 mg  650 mg Oral Q4H PRN  . amLODipine (NORVASC) tablet 5 mg  5 mg Oral Daily      . aspirin EC tablet 81 mg  81 mg Oral Daily  . carvedilol (COREG) tablet 6.25 mg  6.25 mg Oral BID  . furosemide (LASIX) injection 40 mg  40 mg oral Daily  . heparin injection 5,000 Units  5,000 Units Subcutaneous Q8H  . hydrALAZINE (APRESOLINE) tablet 25 mg  25 mg Oral Q8H PRN     . ondansetron (ZOFRAN) injection 4 mg  4 mg Intravenous Q6H PRN    . potassium chloride SA (K-DUR,KLOR-CON) CR tablet 40 mEq  40 mEq Oral BID  . rosuvastatin (CRESTOR) tablet 20 mg  20 mg Oral q1800  . sodium chloride 0.9 % injection 3 mL  3 mL Intravenous Q12H  . spironolactone (ALDACTONE) tablet 25 mg  25  mg Oral Daily  . zolpidem (AMBIEN) tablet 5 mg  5 mg Oral QHS PRN   Assessment & Plan  Pt is a 79 year old woman with PMH including systolic and diastolic CHF, HTN and AS admitted on 1/ 6/14 with worsening dyspnea on exertion.    CV # Dyspnea on exertion. CHF exacerbation versus decompensated aortic stenosis.  She appears euvolemic and stable from a cardiac standpoint at this time. She appears to be at her baseline.  - We appreciate Dr. Irven Shelling consultation. He will follow-up with him as an outpatient to discuss management of aortic stenosis (TEE, consideration of valve replacement) - She received PO Lasix 40 today. We will resume her home torsemide 20 mg qod tomorrow.  - Continue Coreg 6.25 mg BID, spironolactone 25 mg and amlodipine 5 mg daily per Dr. Irven Shelling recommendations # HTN  - Restart Coreg 6.25 mg BID per Dr. Irven Shelling recs  - Restart amlodipine 5 mg daily  - Continue spironolactone 25 mg daily  RENAL # AKI.  FeUrea 38% indicating possible intrinsic renal etiology.  Her GFR has remained stable during her hospitalization 20-30s, however, this is markedly elevated from her last recorded creatinine in 12/2011 of 1.2.  Her renal history is significant for history of right renal angiomyolipoma and left renal mass. Right was though to be benign and  retained, but a biopsy was taken of the left (we are not sure of results of biopsy) and excised 12/2011 by urologist Dr. [name]. - Will speak with Dr. Justin Mend for suggestions for further evaluation>>>We spoke with him briefly on the phone. We will check renal ultrasound. If okay, patient may follow-up with him in 1 month. If abnormal, we may consult nephrology during hospitalization.   - Continue holding ACE for now  FEN # Hypokalemia. Improved with supplements.  - We will give additional supplementation today since anticipate may drop with continued loop diuretic use.   GI # Diet: regular  PPx # DVT PPx: heparin SQ  DISPOSITION: pending  renal ultrasound results, anticipate discharge today or tomorrow if normal.    Sofie Rower MS-IV 02/13/2012  Karen Kays PGY-3 02/13/2012  FMTS Attending Note Patient seen and examined by me today, discussed with resident team and I agree with Dr Marvene Staff plan for today.  She appears quite comfortable and is ambulating independently to the bathroom without difficulty.   Patient with acute-on-chronic kidney injury, thought to be intrinsic although excluding post-renal causes.  Patient is to go for renal US today.  If normal, then patient is to follow up with nephrology and cardiology both as outpatient. Dalbert Mayotte, MD

## 2012-02-13 NOTE — Progress Notes (Signed)
Subjective:  Patient states that she's feeling much better but not back to baseline. No distress with complaints.  Objective:  Vital Signs in the last 24 hours: Temp:  [97.6 F (36.4 C)-98.2 F (36.8 C)] 98.2 F (36.8 C) (01/08 0557) Pulse Rate:  [45-83] 54  (01/08 0603) Resp:  [18] 18  (01/08 0557) BP: (110-161)/(41-89) 161/89 mmHg (01/08 0557) SpO2:  [94 %-97 %] 94 % (01/08 0557) Weight:  [72.1 kg (158 lb 15.2 oz)] 72.1 kg (158 lb 15.2 oz) (01/08 0557)  Intake/Output from previous day: 01/07 0701 - 01/08 0700 In: 843 [P.O.:840; I.V.:3] Out: 1725 [Urine:1725]  Physical Exam:  GENERAL: Moderately built and normal body habitus, who is in no acute distress. Appears much younger than stated age. Alert and oriented x 3. Appears stated age. Very pleasant.  HEENT: normal limits. No JVD.  CARDIAC EXAM: S1 normal, S2 normal  no gallop, II/VI SEM noted in apex and right sternal border conducted to the clavicle.  CHEST EXAM: No tenderness of chest wall.  LUNGS: Clear to percuss and auscultate. No rales or ronchi.  MUSCULOSKELETAL EXAM: Intact with full range of motion in all 4 extremities.  NEUROLOGIC EXAM: Grossly intact without any focal deficits.  EXTREMITY: No edema.  VASCULAR EXAM: No skin breakdown. Carotids bilateral bruit left louder than the right. Normal amplitude. Extremities: Femoral pulse normal. Popliteal pulse normal ; Pedal pulse normal. Otherwise no bruit. No prominent pulse felt in the abdomen. Superficial varicose veins noted without edema or tenderness of the calf or legs.   Lab Results:  [facility] 02/11/12 2020  WBC 7.4  HGB 11.0*  PLT 191    Basename 02/13/12 0430 02/12/12 0625  NA 137 141  K 3.5 3.2*  CL 95* 95*  CO2 31 33*  GLUCOSE 93 94  BUN 38* 34*  CREATININE 2.00* 1.96*    Basename 02/12/12 0625 02/11/12 1946  TROPONINI <0.30 <0.30   Hepatic Function Panel No results found for this basename: PROT,ALBUMIN,AST,ALT,ALKPHOS,BILITOT,BILIDIR,IBILI in  the last 72 hours No results found for this basename: CHOL in the last 72 hours No results found for this basename: PROTIME in the last 72 hours Lipid Panel     Component Value Date/Time   CHOL 206* 05/14/2011 0916   TRIG 206* 05/14/2011 0916   HDL 50 05/14/2011 0916   CHOLHDL 4.1 05/14/2011 0916   VLDL 41* 05/14/2011 0916   LDLCALC 115* 05/14/2011 0916    Assessment/Plan:  1. Acute on chronic diastolic heart failure, with ejection fraction of 40-45%. 2. Acute renal insufficiency.  3. CAD S/P CABG: CABG in 1994. Stents in 2002. H/O Redo CABG 06/05/01 consisting of a patent LIMA to the LAD, with free RIMA to RI, SVG to OM1, SVG to PL A, SVG to ramus intermediate branch Dr. [name].  4. Hypertension. 5. Mixed hyperlipidemia 6. Moderately severe aortic stenosis: Echo in our office 12/31/2011: Left ventricular cavity is normal in size. Moderate  concentric hypertrophy. Moderately decreased systolic global function. Calculated EF 44%. Left atrial cavity is moderately dilated. Moderate to severe aortic stenosis. Peak aortic  gradient is 54.4, mean gradient 27.9 mm of Hg. Moderate aortic valve leaflet calcification. Aortic valve leaflet mobility is moderately restricted. No change since echo 06/21/10.  Recommendation: After evaluation of her presentation, I'm beginning to suspect the etiology for her dyspnea and mild congestive heart failure is probably related to renal issues and cardiac issues. Her cardiac output is not.reduced to give her a acute renal insufficiency due to reduced cardiac  output. Her echocardiogram essentially reveals result left systolic function at least at a mild to moderate level with ejection fraction of around 40-45%. And this has remained stable since echocardiogram a year ago. Her renal function appears to have a plateau and does not been an increase or decrease in spite of her presentation as I would see in an acute renal failure patient and hence I suspect there is a chronic renal  issue that is precipitating her presentation. In spite of patient being off ACE inhibitors, continued spironolactone, there is no change in serum creatinine levels or the EGFR. Hence would recommend evaluation for renal insufficiency including obstructive uropathy, other etiologies. From congestive heart failure standpoint, the congestive heart failure is very mild and I have started her back on low-dose of Coreg which was previously held in the outpatient basis do to marked bradycardia. Patient has underlying bradycardia for many years. I also added amlodipine at 5 mg by mouth daily for now to control her blood pressure. I've discussed the clinical presentation and my thought process with the resident on call. I be happy to continue to follow her, but at this point I suspect she'll need further investigation into the renal etiology for her presentation. Previously she has been diagnosed with a small angiomyoma, but they should not cause any obstructive uropathy. I will also set her up for an outpatient stress testing at some point to exclude progression of coronary artery disease. She'll eventually need further workup of her aortic stenosis including probable TEE. Please see my consultation note from yesterday.   Laverda Page, M.D. 02/13/2012, 9:00 AM [facility] Pager: [phone] Office: [phone] If no answer: [phone]

## 2012-02-14 LAB — BASIC METABOLIC PANEL
BUN: 31 mg/dL — ABNORMAL HIGH (ref 6–23)
CO2: 28 mEq/L (ref 19–32)
Calcium: 9.6 mg/dL (ref 8.4–10.5)
Chloride: 99 mEq/L (ref 96–112)
Creatinine, Ser: 1.71 mg/dL — ABNORMAL HIGH (ref 0.50–1.10)
GFR calc Af Amer: 32 mL/min — ABNORMAL LOW (ref >90)
GFR calc non Af Amer: 28 mL/min — ABNORMAL LOW (ref >90)
Glucose, Bld: 87 mg/dL (ref 70–99)
Potassium: 4.3 mEq/L (ref 3.5–5.1)
Sodium: 139 mEq/L (ref 135–145)

## 2012-02-14 MED ORDER — FUROSEMIDE 20 MG PO TABS: 20.0000 mg | ORAL_TABLET | Freq: Every day | ORAL | Status: DC

## 2012-02-14 NOTE — Discharge Summary (Signed)
Physician Discharge Summary  Patient ID: Jodi [name] MRN: [id number] DOB/AGE: 1932/06/28 79 y.o.  Admit date: 02/11/2012 Discharge date: 02/14/2012  Admission Diagnoses: Shortness-of-breath, concern for heart failure exacerbation, decompensation of aortic stenosis  Discharge Diagnoses:  Principal Problem:  *Acute on chronic systolic CHF (congestive heart failure) Active Problems:  HYPERTENSION, BENIGN SYSTEMIC  CORONARY, ARTERIOSCLEROSIS  Fatigue  Acute renal insufficiency  Discharged Condition: stable  Hospital Course: [name] with a history of CAD s/p CABG x 2, systolic and diastolic heart failure, and moderate aortic stenosis who presented on 02/11/2012 with worsening dyspnea-on-exertion.   Her most recent EF was 35-40%. Her pro-BNP on admission was 8730 and CXR revealed small bilateral pleural effusions/interstitial pulmonary edema. Her dry weight is 158-160 lbs, and she was 161 lbs on admission.  On admission, she was started on Lasix IV 40 mg daily (she was on torsemide 20 mg qod at home). Dr. Nadyne Coombes her cardiologist was consulted the next day. He was concerned about AS decompensation with underlying progression of her CAD. Troponins were negative x 2. She showed symptomatic improvement with increased diruretic; at the time of discharge, she had lost 5 lbs and her weight was 156 lbs; she had a net negative I/O of 2.6 L during her 3.5 day hosptialization  He also started Norvasc to help with afterload reduction and decreased her Coreg due to her persistent bradycardia (a known problem with her). He discussed surgical intervention regarding her aortic stenosis, and they will continue this discussion as an outpatient.  She was discharged to home on the reduced dose of Coreg, her home spironolactone, the new Norvasc, and diuretic was changed from torsemide 20 mg qod on admission to Lasix 40 mg po daily. Her ACEi was held due to her acute renal insufficiency and held at the time  of discharge.   She also presented with acute renal insufficiency. Her last documented creatinine in 12/2011 was 1.2 and has been in that range prior. However, at this admission, her Cr was 2.12 and remained in this range for hospital days 2 and 3. She has had surgery with Dr. [name] a urologist a few months to have a mass removed on her left kidney; her right kidney also has a mass that was not removed, a suspected benign angiomyolipoma. We were concerned a potential obstructive process. However, her renal ultrasound did not show obstruction or hydronephrosis. We spoke with the nephrologist on-call in the hospital Dr. Justin Mend a day prior to her discharge. Since her renal ultrasound was not concerning and her creatinine was trending down to 1.70 on hospital day 4, he recommended follow-up with him in 4 weeks. An appointment was made prior to discharge.   Follow-up issues/significant changes: -Please check creatinine and potassium at PCP follow-up 01/15 -Her ACEi is being held due to her renal insufficiency  -Her diuretic was changed from torsemide 20 mg qod to furosemide 40 daily per Dr. Nadyne Coombes -Please make sure has has follow-up with Dr. Nadyne Coombes. She should have an appointment 1-2 weeks following her hospital discharge  Consults: primary cardiologist Dr. Nadyne Coombes  Disposition: 01-Home or Self Care  Discharge Orders    Future Appointments: Provider: Department: Dept Phone: Center:   02/20/2012 10:45 AM [name], MD [place] [phone] [facility]       Medication List     As of 02/14/2012  8:09 AM    TAKE these medications         amLODipine 5 MG  tablet   Commonly known as: NORVASC   Take 1 tablet (5 mg total) by mouth daily.      aspirin 325 MG tablet   Take 325 mg by mouth daily.      beta carotene w/minerals tablet   Take 1 tablet by mouth daily.      CALCARB 600/D 600-400 MG-UNIT per tablet   Generic drug: Calcium Carbonate-Vitamin D   Take 1 tablet by  mouth daily.      carvedilol 6.25 MG tablet   Commonly known as: COREG   Take 1 tablet (6.25 mg total) by mouth 2 (two) times daily with a meal.      cholecalciferol 1000 UNITS tablet   Commonly known as: VITAMIN D   Take 5,000 Units by mouth daily.      Fish Oil 1200 MG Caps   Take 1,200 mg by mouth daily.      HYDROcodone-acetaminophen 5-500 MG per tablet   Commonly known as: VICODIN   Take 1 tablet by mouth every 8 (eight) hours as needed for pain.      Lecithin 1200 MG Caps   Take 1,200 mg by mouth daily.      Magnesium-Zinc 133.33-5 MG Tabs   Take 1 tablet by mouth daily.      multivitamin capsule   Take 1 capsule by mouth daily.      ramipril 10 MG capsule   Commonly known as: ALTACE   Take 10 mg by mouth daily.      rosuvastatin 20 MG tablet   Commonly known as: CRESTOR   Take 20 mg by mouth daily.      spironolactone 25 MG tablet   Commonly known as: ALDACTONE   Take 1 tablet (25 mg total) by mouth daily.      torsemide 20 MG tablet   Commonly known as: DEMADEX   Take 20 mg by mouth every other day.      vitamin B-12 1000 MCG tablet   Commonly known as: CYANOCOBALAMIN   Take 1,000 mcg by mouth daily.      vitamin C 500 MG tablet   Commonly known as: ASCORBIC ACID   Take 500 mg by mouth daily.      vitamin E 400 UNIT capsule   Take 400 Units by mouth daily.      zolpidem 5 MG tablet   Commonly known as: AMBIEN   Take 5 mg by mouth at bedtime as needed. For sleep           Follow-up Information    Follow up with Sherril Croon, MD. On 03/17/2012. (@ 10:45 am. To see a kidney doctor. )    Contact information:   [place] [address] [phone]       Follow up with [name], MD. On 02/20/2012. (@ 10:45 am. )    Contact information:   [place] [address] [phone]       Follow up with Laverda Page, MD. (His office will call you with an appointment. If you do not hear from them in 1  week, please call them and ask about your appointment.)    Contact information:   [address] [address] [phone]          Signed: Verdie Drown, Remedy Corporan PGY-3 02/14/2012, 8:09 AM

## 2012-02-14 NOTE — Discharge Summary (Signed)
FMTS Attending Note Patient's case discussed with resident team and I agree with resident plan for outpatient follow up with nephrology and cardiology.   Dalbert Mayotte, MD

## 2012-02-14 NOTE — Progress Notes (Signed)
FMTS progress note Jodi [name] is a 79 y.o. female with significant PMH including CAD with CABG x 2, systolic and diastolic CHF, HTN and renal insufficiency who was admitted on 02/11/12 for worsening dyspnea on exertion and concern for acute on chronic CHF exacerbation. Currently stable from cardiac standpoint, but found to have elevated Creatinine on admission and underwent renal U/S per cards recommendations.    Principal Problem:  *Acute on chronic systolic CHF (congestive heart failure)  Active Problems:  HYPERTENSION, BENIGN SYSTEMIC  CORONARY, ARTERIOSCLEROSIS  Fatigue   Subjective  No acute events overnight. Pt reports she continues to breathe comfortably on room air, not worse from admission.  She feels comfortable following up with Dr. Einar Gip (cards) and Dr. Justin Mend (renal) as an outpatient.  Objective  Vitals: I&O:  Physical Exam  General: Well-appearing, sitting up in bed  Neck: no JVD present  CV: Normal S1, S2; III/VI systolic murmur at L sternal border  Resp: Lungs clear bilaterally, no crackles  Abd: soft, non distended  LE: No edema bilaterally   Labs:  - Creatinine 1.71 decreased from 2.00 yesterday - K+ increased to 4.3 from 3.5 yesterday  Meds:  . acetaminophen (TYLENOL) tablet 650 mg 650 mg Oral Q4H PRN  . amLODipine (NORVASC) tablet 5 mg 5 mg Oral Daily  . aspirin EC tablet 81 mg 81 mg Oral Daily  . carvedilol (COREG) tablet 6.25 mg 6.25 mg Oral BID  . furosemide (LASIX) injection 40 mg 40 mg oral Daily  . heparin injection 5,000 Units 5,000 Units Subcutaneous Q8H  . hydrALAZINE (APRESOLINE) tablet 25 mg 25 mg Oral Q8H PRN  . ondansetron (ZOFRAN) injection 4 mg 4 mg Intravenous Q6H PRN  . potassium chloride SA (K-DUR,KLOR-CON) CR tablet 40 mEq 40 mEq Oral BID  . rosuvastatin (CRESTOR) tablet 20 mg 20 mg Oral q1800  . sodium chloride 0.9 % injection 3 mL 3 mL Intravenous Q12H  . spironolactone (ALDACTONE) tablet 25 mg 25 mg Oral Daily  . zolpidem (AMBIEN)  tablet 5 mg 5 mg Oral QHS PRN   Assessment & Plan  Pt is a 79 year old woman with PMH including systolic and diastolic CHF, HTN and AS admitted on 1/ 6/14 with worsening dyspnea on exertion. Evaluated for possible renal obstruction in setting of elevated creatinine - u/s not concerning for obstruction.  Pt stable to discharge home this morning.  CV  # Dyspnea on exertion. CHF exacerbation versus decompensated aortic stenosis.  She appears euvolemic and stable from a cardiac standpoint at this time. She appears to be at her baseline.  - We appreciate Dr. Irven Shelling consultation. He will follow-up with him as an outpatient to discuss management of aortic stenosis (TEE, consideration of valve replacement)  - She received PO Lasix 40 today and will be discharged on 40 mg p.o. Lasix to maintain adequate diuresis. Discussed with Dr. Nadyne Coombes.  - Continue Coreg 6.25 mg BID, spironolactone 25 mg and amlodipine 5 mg daily per Dr. Irven Shelling recommendations  - Dr. Irven Shelling office will contact patient to schedule follow up in 1-2 weeks.  # HTN  # Chronic bradycardia - Coreg 6.25 mg BID per Dr. Irven Shelling recs  - Restart amlodipine 5 mg daily  - Continue spironolactone 25 mg daily   RENAL  # AKI. FeUrea 38% indicating possible intrinsic renal etiology.  Her GFR has remained stable during her hospitalization 20-30s, however, this is markedly elevated from her last recorded creatinine in 12/2011 of 1.2.  Her renal history is significant  for history of right renal angiomyolipoma and left renal mass. Right was though to be benign and retained, but a biopsy was taken of the left (we are not sure of results of biopsy) and excised 12/2011 by urologist Dr. [name].  - Renal u/s signficant only for multiple cysts.  No evidence of obstruction or hydronephrosis.  Will have pt follow up with Dr. Justin Mend in 1 months time.  - Creatinine trending down at 1.7 today.  Will recheck BMP in FM clinic at hospital follow up in 1 week. -  Continue holding ACE for now   FEN  # Hypokalemia. Improved with supplements.  - K+ improved today from 3.9 to 4.3 with decreased dose of diuretic.   - Will recheck K+ in one week. We will not discharge home with K supplementation.  GI  # Diet: regular   PPx  # DVT PPx: heparin SQ   DISPOSITION: discharge to home this morning   Sofie Rower MS-IV Karen Kays PGY-3

## 2012-02-14 NOTE — Progress Notes (Signed)
DC IV, DC Telle, DC Home . Discharge instructions and home medications discussed with patient. Patient denied any questions or concerns at this time. Patient leaving unit via wheelchair and appears in no acute distress.

## 2012-02-14 NOTE — Progress Notes (Signed)
FMTS Attending Note Patient case discussed with resident team, and I agree with assessment and plan as per Dr Marvene Staff progress note, discharge summary for today.  Dalbert Mayotte, MD

## 2012-02-20 ENCOUNTER — Ambulatory Visit (INDEPENDENT_AMBULATORY_CARE_PROVIDER_SITE_OTHER): Payer: PRIVATE HEALTH INSURANCE | Admitting: Family Medicine

## 2012-02-20 ENCOUNTER — Encounter: Payer: Self-pay | Admitting: Family Medicine

## 2012-02-20 VITALS — BP 132/69 | HR 53 | Temp 97.7°F | Ht 63.0 in | Wt 157.0 lb

## 2012-02-20 DIAGNOSIS — I10 Essential (primary) hypertension: Secondary | ICD-10-CM

## 2012-02-20 DIAGNOSIS — I5022 Chronic systolic (congestive) heart failure: Secondary | ICD-10-CM

## 2012-02-20 DIAGNOSIS — N2889 Other specified disorders of kidney and ureter: Secondary | ICD-10-CM

## 2012-02-20 DIAGNOSIS — N183 Chronic kidney disease, stage 3 unspecified: Secondary | ICD-10-CM | POA: Insufficient documentation

## 2012-02-20 DIAGNOSIS — N289 Disorder of kidney and ureter, unspecified: Secondary | ICD-10-CM

## 2012-02-20 LAB — BASIC METABOLIC PANEL
BUN: 38 mg/dL — ABNORMAL HIGH (ref 6–23)
CO2: 27 mEq/L (ref 19–32)
Calcium: 10.5 mg/dL (ref 8.4–10.5)
Chloride: 100 mEq/L (ref 96–112)
Creat: 1.73 mg/dL — ABNORMAL HIGH (ref 0.50–1.10)
Glucose, Bld: 103 mg/dL — ABNORMAL HIGH (ref 70–99)
Potassium: 4.7 mEq/L (ref 3.5–5.3)
Sodium: 138 mEq/L (ref 135–145)

## 2012-02-20 NOTE — Assessment & Plan Note (Addendum)
Noted at hospitilization for CHF.  ACEi held.  No signs of overload.  Will check bmet.  She will follow up with Dr Justin Mend

## 2012-02-20 NOTE — Assessment & Plan Note (Signed)
Seems to be under good control given her ability to walk today.  Continue current medications will monitor blood pressure She is follow up with Dr Einar Gip

## 2012-02-20 NOTE — Progress Notes (Signed)
  Subjective:    Patient ID: Jodi [name], female    DOB: 1932-09-16, 79 y.o.   MRN: [id number]  HPI  Hosp Follow up  Renal Failure no edema or shortness of breath Taking lasix daily.  Not taking ramipril  HYPERTENSION/CHF Disease Monitoring Home BP Monitoring has monitor but is not checking Chest pain- no    Dyspnea- no Medications Compliance-  Has list of all meds. Lightheadedness-  One episode when shopping none since  Edema- no ROS - See HPI  PMH Lab Review   Potassium  Date Value Range Status  02/14/2012 4.3  3.5 - 5.1 mEq/L Final     Sodium  Date Value Range Status  02/14/2012 139  135 - 145 mEq/L Final     Creat  Date Value Range Status  10/22/2011 1.24* 0.50 - 1.10 mg/dL Final     Creatinine, Ser  Date Value Range Status  02/14/2012 1.71* 0.50 - 1.10 mg/dL Final           Review of Systems     Objective:   Physical Exam  Able to walk 75b yds at usual pace without chest pain or shortness of breath or lightheadness pox remains > 94% Heart - Regular rate and rhythm. Gr 3/6 syst m Lungs:  Normal respiratory effort, chest expands symmetrically. Lungs are clear to auscultation, no crackles or wheezes. Extremities:  No cyanosis, edema, or deformity noted with good range of motion of all major joints.        Assessment & Plan:

## 2012-02-20 NOTE — Assessment & Plan Note (Signed)
Controlled on current medications without ramipril which is being held for renal insuff

## 2012-02-20 NOTE — Patient Instructions (Addendum)
Everything looks good  I will call you if your tests are not good.  Otherwise I will send you a letter.  If you do not hear from me with in 2 weeks please call our office.     See Dr Einar Gip as scheduled  See me back in 2-3 months or sooner if you are feeling badly  Monitor your blood pressure and show readings to Dr Justin Mend and Einar Gip

## 2012-02-21 ENCOUNTER — Encounter: Payer: Self-pay | Admitting: Family Medicine

## 2012-02-25 NOTE — Progress Notes (Signed)
Utilization Review Completed.   Brandolyn Shortridge, RN, BSN Nurse Case Manager  [phone]  

## 2012-03-09 ENCOUNTER — Other Ambulatory Visit (HOSPITAL_COMMUNITY): Payer: Self-pay | Admitting: Family Medicine

## 2012-03-23 ENCOUNTER — Other Ambulatory Visit: Payer: Self-pay | Admitting: Family Medicine

## 2012-04-07 ENCOUNTER — Other Ambulatory Visit (HOSPITAL_COMMUNITY): Payer: Self-pay | Admitting: Family Medicine

## 2012-04-08 ENCOUNTER — Other Ambulatory Visit: Payer: Self-pay | Admitting: Family Medicine

## 2012-04-10 ENCOUNTER — Ambulatory Visit (INDEPENDENT_AMBULATORY_CARE_PROVIDER_SITE_OTHER): Payer: PRIVATE HEALTH INSURANCE | Admitting: Family Medicine

## 2012-04-10 ENCOUNTER — Telehealth: Payer: Self-pay | Admitting: Family Medicine

## 2012-04-10 VITALS — BP 167/78 | HR 70 | Temp 97.8°F | Ht 64.0 in

## 2012-04-10 DIAGNOSIS — R300 Dysuria: Secondary | ICD-10-CM

## 2012-04-10 DIAGNOSIS — N390 Urinary tract infection, site not specified: Secondary | ICD-10-CM

## 2012-04-10 LAB — POCT URINALYSIS DIPSTICK
Glucose, UA: NEGATIVE
Spec Grav, UA: 1.015
pH, UA: 6

## 2012-04-10 LAB — POCT UA - MICROSCOPIC ONLY: Epithelial cells, urine per micros: <5

## 2012-04-10 MED ORDER — CEPHALEXIN 250 MG PO CAPS: 250.0000 mg | ORAL_CAPSULE | Freq: Four times a day (QID) | ORAL | Status: DC

## 2012-04-10 NOTE — Progress Notes (Signed)
  Subjective:    Patient ID: Jodi [name], female    DOB: [birth year], 79 y.o.   MRN: [id number]  HPI   UTI Started her usual UTI symptoms of dysuria and frequency yesterday.  Tried azo which helped some.  No nausea or vomiting or back pain or fever  No significant recent medication changes Review of Systems     Objective:   Physical Exam Alert no acute distress No CVAT Abdomen: soft and non-tender without masses, organomegaly or hernias noted.  No guarding or rebound        Assessment & Plan:

## 2012-04-10 NOTE — Patient Instructions (Addendum)
If you are not better in 2-3 days or if you have a high fever or back pain or nausea or vomiting then call us

## 2012-04-10 NOTE — Telephone Encounter (Signed)
Patient states burning and frequency of urination started this AM.  Appointment scheduled for this afternoon.

## 2012-04-10 NOTE — Assessment & Plan Note (Signed)
Consistent with UTI without systemic symptoms.  Will treat with keflex.  May be a candidate for over the phone treatment vs suppression since has these frequently

## 2012-04-10 NOTE — Telephone Encounter (Signed)
Patient is having some UTI sx. Would like to see a doctor this afternoon. Pls call patient.

## 2012-04-12 LAB — URINE CULTURE
Colony Count: NO GROWTH
Organism ID, Bacteria: NO GROWTH

## 2012-04-15 ENCOUNTER — Other Ambulatory Visit (HOSPITAL_COMMUNITY): Payer: Self-pay | Admitting: Interventional Radiology

## 2012-04-15 DIAGNOSIS — N2889 Other specified disorders of kidney and ureter: Secondary | ICD-10-CM

## 2012-04-28 ENCOUNTER — Other Ambulatory Visit: Payer: Self-pay | Admitting: Family Medicine

## 2012-04-29 NOTE — Telephone Encounter (Signed)
Please call in these 2 Rx  Thanks [place]

## 2012-04-30 ENCOUNTER — Other Ambulatory Visit: Payer: Self-pay | Admitting: Family Medicine

## 2012-04-30 NOTE — Telephone Encounter (Signed)
Rx called in and patient notified. Karoline Caldwell, Loralyn Freshwater

## 2012-05-26 ENCOUNTER — Other Ambulatory Visit: Payer: Self-pay | Admitting: Family Medicine

## 2012-05-28 ENCOUNTER — Ambulatory Visit (HOSPITAL_COMMUNITY): Admission: RE | Admit: 2012-05-28 | Payer: PRIVATE HEALTH INSURANCE | Source: Ambulatory Visit

## 2012-05-28 ENCOUNTER — Other Ambulatory Visit: Payer: PRIVATE HEALTH INSURANCE

## 2012-05-28 NOTE — Telephone Encounter (Signed)
Patient is calling because her pharmacy hasn't heard back on her refill yet.

## 2012-07-03 ENCOUNTER — Ambulatory Visit (HOSPITAL_COMMUNITY)
Admission: RE | Admit: 2012-07-03 | Discharge: 2012-07-03 | Disposition: A | Discharge status: Home / Self Care | Payer: PRIVATE HEALTH INSURANCE | Source: Ambulatory Visit | Attending: Interventional Radiology | Admitting: Interventional Radiology

## 2012-07-03 ENCOUNTER — Other Ambulatory Visit: Payer: PRIVATE HEALTH INSURANCE

## 2012-07-10 ENCOUNTER — Other Ambulatory Visit (HOSPITAL_COMMUNITY): Payer: Self-pay | Admitting: Family Medicine

## 2012-07-16 ENCOUNTER — Other Ambulatory Visit: Payer: Self-pay | Admitting: *Deleted

## 2012-07-16 MED ORDER — FUROSEMIDE 20 MG PO TABS: 20.0000 mg | ORAL_TABLET | Freq: Every day | ORAL | Status: DC

## 2012-07-16 MED ORDER — CARVEDILOL 6.25 MG PO TABS: ORAL_TABLET | ORAL | Status: DC

## 2012-07-16 NOTE — Telephone Encounter (Signed)
Requested Prescriptions   Pending Prescriptions Disp Refills  . furosemide (LASIX) 20 MG tablet 30 tablet 0  . carvedilol (COREG) 6.25 MG tablet 60 tablet 0    Tildon Husky, RN-BSN

## 2012-07-17 ENCOUNTER — Ambulatory Visit (HOSPITAL_COMMUNITY)
Admission: RE | Admit: 2012-07-17 | Discharge: 2012-07-17 | Disposition: A | Discharge status: Home / Self Care | Payer: PRIVATE HEALTH INSURANCE | Source: Ambulatory Visit | Attending: Interventional Radiology | Admitting: Interventional Radiology

## 2012-07-17 DIAGNOSIS — K449 Diaphragmatic hernia without obstruction or gangrene: Secondary | ICD-10-CM | POA: Insufficient documentation

## 2012-07-17 DIAGNOSIS — K7689 Other specified diseases of liver: Secondary | ICD-10-CM | POA: Insufficient documentation

## 2012-07-17 DIAGNOSIS — K8020 Calculus of gallbladder without cholecystitis without obstruction: Secondary | ICD-10-CM | POA: Insufficient documentation

## 2012-07-17 DIAGNOSIS — D3000 Benign neoplasm of unspecified kidney: Secondary | ICD-10-CM | POA: Insufficient documentation

## 2012-07-17 DIAGNOSIS — N289 Disorder of kidney and ureter, unspecified: Secondary | ICD-10-CM | POA: Insufficient documentation

## 2012-07-17 DIAGNOSIS — N2889 Other specified disorders of kidney and ureter: Secondary | ICD-10-CM

## 2012-07-17 MED ORDER — GADOBENATE DIMEGLUMINE 529 MG/ML IV SOLN
7.0000 mL | Freq: Once | INTRAVENOUS | Status: AC | PRN
  Administered 2012-07-17: 7 mL via INTRAVENOUS

## 2012-08-11 ENCOUNTER — Ambulatory Visit: Payer: Self-pay | Admitting: Nurse Practitioner

## 2012-08-11 ENCOUNTER — Ambulatory Visit (INDEPENDENT_AMBULATORY_CARE_PROVIDER_SITE_OTHER): Payer: PRIVATE HEALTH INSURANCE | Admitting: Family Medicine

## 2012-08-11 ENCOUNTER — Encounter: Payer: Self-pay | Admitting: Family Medicine

## 2012-08-11 VITALS — BP 159/68 | HR 59 | Temp 97.8°F | Ht 64.0 in | Wt 163.0 lb

## 2012-08-11 DIAGNOSIS — M1990 Unspecified osteoarthritis, unspecified site: Secondary | ICD-10-CM

## 2012-08-11 DIAGNOSIS — I5022 Chronic systolic (congestive) heart failure: Secondary | ICD-10-CM

## 2012-08-11 DIAGNOSIS — I10 Essential (primary) hypertension: Secondary | ICD-10-CM

## 2012-08-11 MED ORDER — HYDROCODONE-ACETAMINOPHEN 5-500 MG PO TABS: ORAL_TABLET | ORAL | Status: DC

## 2012-08-11 MED ORDER — FUROSEMIDE 20 MG PO TABS: 20.0000 mg | ORAL_TABLET | Freq: Every day | ORAL | Status: DC

## 2012-08-11 MED ORDER — CARVEDILOL 6.25 MG PO TABS: ORAL_TABLET | ORAL | Status: DC

## 2012-08-11 NOTE — Assessment & Plan Note (Signed)
Stable - continue as needed use of hydrocodone Refill given

## 2012-08-11 NOTE — Assessment & Plan Note (Signed)
Well controlled continue current medications  

## 2012-08-11 NOTE — Progress Notes (Signed)
  Subjective:    Patient ID: Jodi [name], female    DOB: 1932-11-07, 79 y.o.   MRN: [id number]  HPI  DJD Still intermittent pain in back and hands.  Uses hydrocodone rarely but does help.  No red swollen joints.   CHF No leg edema at all.  No shortness of breath with exertion - tries to walk a mile each day.  No lightheadness with standing   HYPERTENSION Disease Monitoring Home BP Monitoring - about half of her readings show systolic > 0000000  Chest pain- no    Dyspnea- no Medications Compliance-  Daily as ordered. Lightheadedness-  no  Edema- no ROS - See HPI  PMH Lab Review   Potassium  Date Value Range Status  02/20/2012 4.7  3.5 - 5.3 mEq/L Final     Sodium  Date Value Range Status  02/20/2012 138  135 - 145 mEq/L Final     Creat  Date Value Range Status  02/20/2012 1.73* 0.50 - 1.10 mg/dL Final     Creatinine, Ser  Date Value Range Status  02/14/2012 1.71* 0.50 - 1.10 mg/dL Final           Review of Systems     Objective:   Physical Exam  Lungs:  Normal respiratory effort, chest expands symmetrically. Lungs are clear to auscultation, no crackles or wheezes. Heart - Regular rate and rhythm.  AS murmur, No gallops or rubs.    Extremities:  No cyanosis, edema, or deformity noted with good range of motion of all major joints.          Assessment & Plan:

## 2012-08-11 NOTE — Patient Instructions (Addendum)
Take amlodipine 10 mg (2 5 mg tab) every day  Check your blood pressure aim is to have less than 150/90 all the time  If you feel lightheadness or have leg swelling then call me  Come back in 1 month with your blood pressure readings  I will call you if your lab tests are not normal.  Otherwise we will discuss them at your next visit.

## 2012-08-11 NOTE — Assessment & Plan Note (Signed)
Not at goal.  Will increse amlodipine and have her monitor her blood pressure and edema

## 2012-08-12 ENCOUNTER — Encounter: Payer: Self-pay | Admitting: Family Medicine

## 2012-08-12 ENCOUNTER — Ambulatory Visit
Admission: RE | Admit: 2012-08-12 | Discharge: 2012-08-12 | Disposition: A | Discharge status: Home / Self Care | Payer: PRIVATE HEALTH INSURANCE | Source: Ambulatory Visit | Attending: Interventional Radiology | Admitting: Interventional Radiology

## 2012-08-12 DIAGNOSIS — N2889 Other specified disorders of kidney and ureter: Secondary | ICD-10-CM

## 2012-08-12 LAB — COMPREHENSIVE METABOLIC PANEL
ALT: 23 U/L (ref 0–35)
AST: 24 U/L (ref 0–37)
Albumin: 4.9 g/dL (ref 3.5–5.2)
Alkaline Phosphatase: 64 U/L (ref 39–117)
BUN: 21 mg/dL (ref 6–23)
CO2: 29 mEq/L (ref 19–32)
Calcium: 9.9 mg/dL (ref 8.4–10.5)
Chloride: 102 mEq/L (ref 96–112)
Creat: 1.41 mg/dL — ABNORMAL HIGH (ref 0.50–1.10)
Glucose, Bld: 124 mg/dL — ABNORMAL HIGH (ref 70–99)
Potassium: 4.2 mEq/L (ref 3.5–5.3)
Sodium: 137 mEq/L (ref 135–145)
Total Bilirubin: 0.9 mg/dL (ref 0.3–1.2)
Total Protein: 7.8 g/dL (ref 6.0–8.3)

## 2012-08-12 NOTE — Progress Notes (Signed)
Patient ID: Jodi [name], female   DOB: 18-Mar-1932, 79 y.o.   MRN: [id number]  ESTABLISHED PATIENT OFFICE VISIT  Chief Complaint: Follow-up of enhancing upper pole left renal tumor.  History: [name] returns for follow-up. Biopsy of the left upper renal lesion on 12/21/2011 did not show evidence of malignancy. It was felt that the lesion was able to be visualized at that time under ultrasound guidance. The patient has a known fat containing right upper pole renal lesion consistent with angiomyolipoma. She has been asymptomatic.  Review of Systems: No fever or chills. No hematuria, dysuria or abnormal urinary frequency. No abdominal or flank pain. No nausea or vomiting.  Exam: Vital signs: Blood pressure 122/69, pulse 58, respirations 16, temperature 98.1, oxygen saturation 97% on room air. General: No acute distress. Abdomen: Soft and nontender. No flank tenderness.  Labs: BUN 21, creatinine 1.41 on 08/11/2012.  Imaging: Follow-up MRI was performed on 07/18/2012. This shows stable size and appearance of the left upper pole renal lesion and stable size of the right upper pole angiomyolipoma.  Assessment and Plan: Stable left renal lesion with benign tissue by prior core biopsy. I have recommended continued surveillance of this lesion as well as the probable angiomyolipoma of the right kidney. I recommended another MRI in 1 year.

## 2012-08-12 NOTE — Progress Notes (Signed)
Denies hematuria or any other problems with urination.  States that she is doing well overall.  Tristen Luce Riki Rusk, RN 08/12/2012 11:16 AM

## 2012-09-08 ENCOUNTER — Telehealth: Payer: Self-pay | Admitting: Family Medicine

## 2012-09-08 ENCOUNTER — Telehealth: Payer: Self-pay | Admitting: *Deleted

## 2012-09-08 MED ORDER — AMLODIPINE BESYLATE 10 MG PO TABS: 10.0000 mg | ORAL_TABLET | Freq: Every day | ORAL | Status: DC

## 2012-09-08 NOTE — Telephone Encounter (Signed)
Pharmacy received Norvasc Rx today with two sets of directions on it.  Take one a day.  Take two a day.  Will fwd to MD to clarify.  Maxmillian Carsey, Loralyn Freshwater, [place]

## 2012-09-08 NOTE — Telephone Encounter (Signed)
Pt is requesting refill for amlodipine 10 mg be sent to her pharmacy. She was taking 5 mg , but Dr. Erin Hearing increased her dosage to 10 mg and so was taking 2 of the 5 mg a day. JW

## 2012-09-08 NOTE — Telephone Encounter (Signed)
Rx was written on 08/11/12 for 5mg  norvasc take 2 tabs daily but only dispense 30 tablets.  Please advise.

## 2012-09-08 NOTE — Telephone Encounter (Signed)
Will forward to MD. Jazmin Hartsell,CMA  

## 2012-09-09 ENCOUNTER — Other Ambulatory Visit: Payer: Self-pay | Admitting: Family Medicine

## 2012-09-09 MED ORDER — AMLODIPINE BESYLATE 10 MG PO TABS: 10.0000 mg | ORAL_TABLET | Freq: Every day | ORAL | Status: DC

## 2012-09-09 NOTE — Telephone Encounter (Signed)
I sent in new eRx thanks

## 2012-09-15 ENCOUNTER — Other Ambulatory Visit: Payer: Self-pay | Admitting: Family Medicine

## 2012-09-23 ENCOUNTER — Encounter: Payer: Self-pay | Admitting: Family Medicine

## 2012-09-23 DIAGNOSIS — C649 Malignant neoplasm of unspecified kidney, except renal pelvis: Secondary | ICD-10-CM

## 2012-09-23 HISTORY — DX: Malignant neoplasm of unspecified kidney, except renal pelvis: C64.9

## 2012-09-29 ENCOUNTER — Other Ambulatory Visit: Payer: Self-pay | Admitting: Family Medicine

## 2012-11-10 ENCOUNTER — Ambulatory Visit (INDEPENDENT_AMBULATORY_CARE_PROVIDER_SITE_OTHER): Payer: PRIVATE HEALTH INSURANCE | Admitting: Family Medicine

## 2012-11-10 ENCOUNTER — Other Ambulatory Visit: Payer: Self-pay | Admitting: Nurse Practitioner

## 2012-11-10 ENCOUNTER — Encounter: Payer: Self-pay | Admitting: Family Medicine

## 2012-11-10 VITALS — BP 158/83 | HR 62 | Ht 63.0 in | Wt 162.0 lb

## 2012-11-10 DIAGNOSIS — Z23 Encounter for immunization: Secondary | ICD-10-CM

## 2012-11-10 DIAGNOSIS — I5022 Chronic systolic (congestive) heart failure: Secondary | ICD-10-CM

## 2012-11-10 DIAGNOSIS — G4700 Insomnia, unspecified: Secondary | ICD-10-CM

## 2012-11-10 DIAGNOSIS — E7800 Pure hypercholesterolemia, unspecified: Secondary | ICD-10-CM

## 2012-11-10 DIAGNOSIS — I10 Essential (primary) hypertension: Secondary | ICD-10-CM

## 2012-11-10 LAB — LIPID PANEL
Cholesterol: 181 mg/dL (ref 0–200)
HDL: 50 mg/dL (ref >39)
LDL Cholesterol: 82 mg/dL (ref 0–99)
Total CHOL/HDL Ratio: 3.6 Ratio
Triglycerides: 247 mg/dL — ABNORMAL HIGH (ref <150)
VLDL: 49 mg/dL — ABNORMAL HIGH (ref 0–40)

## 2012-11-10 LAB — CBC
HCT: 41.2 % (ref 36.0–46.0)
Hemoglobin: 14.4 g/dL (ref 12.0–15.0)
MCH: 30.8 pg (ref 26.0–34.0)
MCHC: 35 g/dL (ref 30.0–36.0)
MCV: 88.2 fL (ref 78.0–100.0)
Platelets: 260 10*3/uL (ref 150–400)
RBC: 4.67 MIL/uL (ref 3.87–5.11)
RDW: 13.7 % (ref 11.5–15.5)
WBC: 8.7 10*3/uL (ref 4.0–10.5)

## 2012-11-10 MED ORDER — ZOLPIDEM TARTRATE 5 MG PO TABS: 5.0000 mg | ORAL_TABLET | Freq: Every evening | ORAL | Status: DC | PRN

## 2012-11-10 NOTE — Assessment & Plan Note (Signed)
Seems reasonably controlled on 4 meds.  Continue her current medications

## 2012-11-10 NOTE — Patient Instructions (Addendum)
Good to see you today!  Thanks for coming in.  Keep doing what you are doing Mention to Dr Einar Gip about the shortness of breath when walking  I will call you if your tests are not good.  Otherwise I will send you a letter.  If you do not hear from me with in 2 weeks please call our office.     Come back in 6 mo

## 2012-11-10 NOTE — Assessment & Plan Note (Signed)
Stable uses ambien appropriately

## 2012-11-10 NOTE — Progress Notes (Signed)
  Subjective:    Patient ID: Jodi [name], female    DOB: 03-05-1932, 79 y.o.   MRN: [id number]  HPI  CHF No leg edema at all.  Some shortness of breath when she walks - has to stop twice during her mile walk when in past (6 mo or so ago) only once .  No lightheadness with standing  No chest pain  HYPERTENSION Disease Monitoring Home BP Monitoring - did not bring in any readings Chest pain- no    Dyspnea- see above Medications Compliance-  Daily as ordered. Lightheadedness-  no  Edema- no ROS - See HPI  Insomnia Continues as usual.  No depressive symptoms.  Uses ambien once week or less.  No daytime sleepiness  Review of Symptoms - see HPI  PMH - Smoking status noted.     Review of Systems     Objective:   Physical Exam  Alert no acute distress Able to get on exam table easily Lungs:  Normal respiratory effort, chest expands symmetrically. Lungs are clear to auscultation, no crackles or wheezes. Heart - Regular rate and rhythm.  [id number] systolic murmur unchanged No JVD Extrem - no edema      Assessment & Plan:

## 2012-11-10 NOTE — Assessment & Plan Note (Signed)
Seems somewhat more symptomatic with increased DOE.  Looks well controlled volume wise.  Will check labs.  Suggested she mention this to Dr Einar Gip at her upcoming visit

## 2012-11-11 ENCOUNTER — Encounter: Payer: Self-pay | Admitting: Family Medicine

## 2012-11-19 ENCOUNTER — Telehealth: Payer: Self-pay | Admitting: Family Medicine

## 2012-11-19 NOTE — Telephone Encounter (Signed)
Pt called and would like a refill of her hydrocodone left up front for pickup. JW

## 2012-11-19 NOTE — Telephone Encounter (Signed)
Will forward to MD. Jazmin Hartsell,CMA  

## 2012-11-20 MED ORDER — HYDROCODONE-ACETAMINOPHEN 5-325 MG PO TABS: 1.0000 | ORAL_TABLET | Freq: Three times a day (TID) | ORAL | Status: DC | PRN

## 2012-11-20 NOTE — Telephone Encounter (Signed)
Left on your desk Thanks [facility]

## 2012-11-20 NOTE — Telephone Encounter (Signed)
Pt is aware that they are left upfront.  Jodi [name]

## 2012-12-15 ENCOUNTER — Other Ambulatory Visit: Payer: Self-pay | Admitting: Family Medicine

## 2013-01-13 ENCOUNTER — Other Ambulatory Visit: Payer: Self-pay | Admitting: Family Medicine

## 2013-01-13 DIAGNOSIS — I10 Essential (primary) hypertension: Secondary | ICD-10-CM

## 2013-02-02 ENCOUNTER — Ambulatory Visit (INDEPENDENT_AMBULATORY_CARE_PROVIDER_SITE_OTHER): Payer: PRIVATE HEALTH INSURANCE | Admitting: Family Medicine

## 2013-02-02 ENCOUNTER — Encounter: Payer: Self-pay | Admitting: Family Medicine

## 2013-02-02 VITALS — BP 142/79 | HR 74 | Temp 97.5°F | Wt 163.0 lb

## 2013-02-02 DIAGNOSIS — J189 Pneumonia, unspecified organism: Secondary | ICD-10-CM | POA: Insufficient documentation

## 2013-02-02 MED ORDER — BENZONATATE 100 MG PO CAPS: 100.0000 mg | ORAL_CAPSULE | Freq: Two times a day (BID) | ORAL | Status: DC | PRN

## 2013-02-02 MED ORDER — AZITHROMYCIN 250 MG PO TABS: ORAL_TABLET | ORAL | Status: DC

## 2013-02-02 NOTE — Patient Instructions (Addendum)
Based on your history and exam, we will treat you for a possible pneumonia. At this time I do not want to order an x-ray. The treatment will be Azithromycin (Z-pak) 500mg  (2 tablets) on day 1, and then 250mg  (1 tablet) for 4 days. It is still possible that your cough is caused by a virus, in which case the antibiotics will not help. If symptoms do not improve or worsen please call the clinic or go to the ED.   For your cough: Tessalon perle, take 1 capsule as needed every 8 hours as needed If this doesn't work, you can try other cough medicines such as Robitussin DM.

## 2013-02-02 NOTE — Progress Notes (Signed)
   Subjective:    Patient ID: Jodi [name], female    DOB: 24-Sep-1932, 79 y.o.   MRN: [id number]  HPI  CC: cough  # Cough - started 3 days ago - Had associated runny nose Saturday/Sunday - Productive sputum on Sat/Sun, but not today. Green/clear phlegm. - not short of breath today, but breathing was worse yesterday - Has not noticed any fevers or chills. - no sick contacts - throat is sore from coughing - has tried coricidinHBP, cough syrup, throat lozenges  Review of Systems No ear pain. CP when coughing (rib pain all around chest, not associated with exertion). Some SOB. Diarrhea on Saturday now resolved. No dysuria.    Objective:   Physical Exam BP 142/79  Pulse 74  Temp(Src) 97.5 F (36.4 C) (Oral)  Wt 163 lb (73.936 kg)  General: NAD HEENT: PERRL, EOMI. Left TM with small central perforation, right side of TM green discoloration. Right TM with erythema and left sided perforation. Oropharynx mild erythema. CV: RRR, normal heart sounds Resp: LUL and LLL rhonchi and end expiratory wheezing. Right side clear to auscultation. Cough present during exam. Ext: trace edema, no cyanosis. 2+ radial pulses bilaterally      Assessment & Plan:  See Problem List documentation

## 2013-02-02 NOTE — Assessment & Plan Note (Signed)
Asymmetric left sided rhonchi/wheezes. No fevers. No red flags or respiratory distress. Hold off on CXR for now, will treat for presumptive pneumonia with z-pak. Tessalon perles or OTC cough suppressant if that does not work. Return precautions given and told to call/go to ED if symptoms worsen.

## 2013-02-06 ENCOUNTER — Telehealth: Payer: Self-pay | Admitting: Family Medicine

## 2013-02-06 ENCOUNTER — Ambulatory Visit (INDEPENDENT_AMBULATORY_CARE_PROVIDER_SITE_OTHER): Payer: 59 | Admitting: Emergency Medicine

## 2013-02-06 ENCOUNTER — Ambulatory Visit: Payer: 59

## 2013-02-06 VITALS — BP 138/72 | HR 65 | Temp 98.1°F | Resp 16 | Ht 64.0 in | Wt 161.0 lb

## 2013-02-06 DIAGNOSIS — J189 Pneumonia, unspecified organism: Secondary | ICD-10-CM

## 2013-02-06 DIAGNOSIS — J209 Acute bronchitis, unspecified: Secondary | ICD-10-CM

## 2013-02-06 MED ORDER — LEVOFLOXACIN 500 MG PO TABS: 500.0000 mg | ORAL_TABLET | Freq: Every day | ORAL | Status: AC

## 2013-02-06 MED ORDER — PROMETHAZINE-CODEINE 6.25-10 MG/5ML PO SYRP: 5.0000 mL | ORAL_SOLUTION | Freq: Four times a day (QID) | ORAL | Status: DC | PRN

## 2013-02-06 NOTE — Telephone Encounter (Signed)
Pt called after hours line with complaints of deep cough, fatigue and pain with cough. No fevers to report of, but admits to occasional chills. Not eating well. Was seen in the clinic by Dr. Joya Gaskins on Monday and was given azithromycin for 5 days. Patient states she has completed abx and has had no improvement, with increase in fatigue and cough. Patient is actively coughing, with difficulty in completing full sentences during phone conversation.  P: Advised patient to seek care at either the ED or urgent care. She needs to be re-evaluated and may need CXR to r/o pneumonia. Patient voiced understanding and reports she will go to an urgent care near her home.  Kuneff, Renee DO

## 2013-02-06 NOTE — Progress Notes (Signed)
[facility] [address], [place] [address] [phone]  Date:  02/06/2013   Name:  Jodi [name]   DOB:  04-01-1932   MRN:  [id number]  PCP:  [name], MD    Chief Complaint: Follow-up   History of Present Illness:  Jodi [name] is a 80 y.o. very pleasant female patient who presents with the following:  Seen in family practice clinic on 12/27 and treated with zithromax for a presumed CAP.  Today finished the antibiotic and is concerned that she is still coughing.  No fever or chills.  Continues to have a productive cough with a mucopurulent sputum.  Fatigued.  Cough is worse at night.  Some wheezing with exertional shortness of breath.  No nausea or vomiting.  No rash or stool change.  No improvement with over the counter medications or other home remedies. Denies other complaint or health concern today.   Patient Active Problem List   Diagnosis Date Noted  . CAP (community acquired pneumonia) 02/02/2013  . Renal cell carcinoma 09/23/2012  . Renal insufficiency 02/20/2012  . Aortic stenosis 12/06/2010  . SEBORRHEA 11/10/2007  . INSOMNIA 06/09/2007  . OTITIS MEDIA, CHRONIC 06/04/2006  . HYPERCHOLESTEROLEMIA 04/04/2006  . HYPERTENSION, BENIGN SYSTEMIC 04/04/2006  . CORONARY, ARTERIOSCLEROSIS 04/04/2006  . CHF - EJECTION FRACTION < 50% 04/04/2006  . OSTEOARTHRITIS, MULTI SITES 04/04/2006    Past Medical History  Diagnosis Date  . Myocardial infarction   . Coronary artery disease   . Hypertension   . Heart murmur   . Chronic kidney disease   . Aortic stenosis   . CHF (congestive heart failure)   . Hypercholesteremia   . Shortness of breath   . Stroke   . Renal mass 12/06/2010    CT Abdomen 11-27-10 upper pole region of the right kidney which is suspicious for solid lesion, measuring 1.9 x 1.3 cm. This lesion is concerning for renal cell carcinoma given the solid appearance.  Following with Dr [name].  Renal bx was benign     . Splenic infarct 12/06/2010  . Renal cell carcinoma 09/23/2012    Past Surgical History  Procedure Laterality Date  . Radical hysterectomy    . Coronary artery bypass graft  1995 and 2003  . Abdominal hysterectomy    . Eye surgery    . Cardiac catheterization      History  Substance Use Topics  . Smoking status: Never Smoker   . Smokeless tobacco: Never Used  . Alcohol Use: No    Family History  Problem Relation Age of Onset  . Coronary artery disease Mother   . Coronary artery disease Brother   . Stroke Mother     Allergies  Allergen Reactions  . Sulfamethoxazole-Trimethoprim Other (See Comments)    REACTION: shaking /chills    Medication list has been reviewed and updated.  Current Outpatient Prescriptions on File Prior to Visit  Medication Sig Dispense Refill  . amLODipine (NORVASC) 10 MG tablet TAKE 1 TABLET BY MOUTH EVERY DAY  30 tablet  1  . Ascorbic Acid (VITAMIN C) 500 MG tablet Take 500 mg by mouth daily.        . beta carotene w/minerals (OCUVITE) tablet Take 1 tablet by mouth daily.      . Calcium Carbonate-Vitamin D (CALCARB 600/D) 600-400 MG-UNIT per tablet Take 1 tablet by mouth daily.       . carvedilol (COREG) 6.25 MG tablet TAKE 1 TABLET BY MOUTH TWICE  DAILY WITH A MEAL  60 tablet  11  . cholecalciferol (VITAMIN D) 1000 UNITS tablet Take 5,000 Units by mouth daily.      . clopidogrel (PLAVIX) 75 MG tablet TAKE 1 BY MOUTH DAILY  30 tablet  3  . CRESTOR 20 MG tablet TAKE 1 TABLET BY MOUTH DAILY  30 tablet  11  . furosemide (LASIX) 20 MG tablet Take 1 tablet (20 mg total) by mouth daily.  30 tablet  11  . Lecithin 1200 MG CAPS Take 1,200 mg by mouth daily.      . Magnesium-Zinc 133.33-5 MG TABS Take 1 tablet by mouth daily.      . Multiple Vitamin (MULTIVITAMIN) capsule Take 1 capsule by mouth daily.        . Omega-3 Fatty Acids (FISH OIL) 1200 MG CAPS Take 1,200 mg by mouth daily.      Marland Kitchen spironolactone (ALDACTONE) 25 MG tablet TAKE 1 TABLET BY MOUTH  EVERY DAY  30 tablet  6  . triamcinolone cream (KENALOG) 0.1 % APPLY TOPICALLY TWICE DAILY  30 g  1  . vitamin B-12 (CYANOCOBALAMIN) 1000 MCG tablet Take 1,000 mcg by mouth daily.      . vitamin E 400 UNIT capsule Take 400 Units by mouth daily.        Marland Kitchen zolpidem (AMBIEN) 5 MG tablet Take 1 tablet (5 mg total) by mouth at bedtime as needed for sleep.  30 tablet  1  . benzonatate (TESSALON) 100 MG capsule Take 1 capsule (100 mg total) by mouth 2 (two) times daily as needed for cough.  20 capsule  0  . HYDROcodone-acetaminophen (NORCO/VICODIN) 5-325 MG per tablet Take 1 tablet by mouth every 8 (eight) hours as needed for pain.  30 tablet  0   No current facility-administered medications on file prior to visit.    Review of Systems:  As per HPI, otherwise negative.    Physical Examination: Filed Vitals:   02/06/13 1700  BP: 138/72  Pulse: 65  Temp: 98.1 F (36.7 C)  Resp: 16   Filed Vitals:   02/06/13 1700  Height: 5\' 4"  (1.626 m)  Weight: 161 lb (73.029 kg)   Body mass index is 27.62 kg/(m^2). Ideal Body Weight: Weight in (lb) to have BMI = 25: 145.3  GEN: WDWN, NAD, Non-toxic, A & O x 3  Persistent cough HEENT: Atraumatic, Normocephalic. Neck supple. No masses, No LAD. Ears and Nose: No external deformity. CV: RRR, No M/G/R. No JVD. No thrill. No extra heart sounds. PULM: CTA B, no wheezes, crackles, rhonchi. No retractions. No resp. distress. No accessory muscle use. ABD: S, NT, ND, +BS. No rebound. No HSM. EXTR: No c/c/e NEURO Normal gait.  PSYCH: Normally interactive. Conversant. Not depressed or anxious appearing.  Calm demeanor.    Assessment and Plan: Bronchitis levaquin Phen c cod   Signed,  Ellison Carwin, MD   [facility] reading (PRIMARY) by  Dr. Ouida Sills.  Negative chest .

## 2013-02-06 NOTE — Patient Instructions (Signed)
Acute Bronchitis Bronchitis is inflammation of the airways that extend from the windpipe into the lungs (bronchi). The inflammation often causes mucus to develop. This leads to a cough, which is the most common symptom of bronchitis.  In acute bronchitis, the condition usually develops suddenly and goes away over time, usually in a couple weeks. Smoking, allergies, and asthma can make bronchitis worse. Repeated episodes of bronchitis may cause further lung problems.  CAUSES Acute bronchitis is most often caused by the same virus that causes a cold. The virus can spread from person to person (contagious).  SIGNS AND SYMPTOMS   Cough.   Fever.   Coughing up mucus.   Body aches.   Chest congestion.   Chills.   Shortness of breath.   Sore throat.  DIAGNOSIS  Acute bronchitis is usually diagnosed through a physical exam. Tests, such as chest X-rays, are sometimes done to rule out other conditions.  TREATMENT  Acute bronchitis usually goes away in a couple weeks. Often times, no medical treatment is necessary. Medicines are sometimes given for relief of fever or cough. Antibiotics are usually not needed but may be prescribed in certain situations. In some cases, an inhaler may be recommended to help reduce shortness of breath and control the cough. A cool mist vaporizer may also be used to help thin bronchial secretions and make it easier to clear the chest.  HOME CARE INSTRUCTIONS  Get plenty of rest.   Drink enough fluids to keep your urine clear or pale yellow (unless you have a medical condition that requires fluid restriction). Increasing fluids may help thin your secretions and will prevent dehydration.   Only take over-the-counter or prescription medicines as directed by your health care provider.   Avoid smoking and secondhand smoke. Exposure to cigarette smoke or irritating chemicals will make bronchitis worse. If you are a smoker, consider using nicotine gum or skin  patches to help control withdrawal symptoms. Quitting smoking will help your lungs heal faster.   Reduce the chances of another bout of acute bronchitis by washing your hands frequently, avoiding people with cold symptoms, and trying not to touch your hands to your mouth, nose, or eyes.   Follow up with your health care provider as directed.  SEEK MEDICAL CARE IF: Your symptoms do not improve after 1 week of treatment.  SEEK IMMEDIATE MEDICAL CARE IF:  You develop an increased fever or chills.   You have chest pain.   You have severe shortness of breath.  You have bloody sputum.   You develop dehydration.  You develop fainting.  You develop repeated vomiting.  You develop a severe headache. MAKE SURE YOU:   Understand these instructions.  Will watch your condition.  Will get help right away if you are not doing well or get worse. Document Released: 03/01/2004 Document Revised: 09/24/2012 Document Reviewed: 07/15/2012 Exit[employer] Patient Information 2014 [employer].  

## 2013-03-11 ENCOUNTER — Telehealth: Payer: Self-pay | Admitting: Family Medicine

## 2013-03-11 NOTE — Telephone Encounter (Signed)
Patient is requesting a RX on Ambien. She states nothing OTC is working & Ambien is the only thing that helps.

## 2013-03-12 MED ORDER — ZOLPIDEM TARTRATE 5 MG PO TABS: 5.0000 mg | ORAL_TABLET | Freq: Every evening | ORAL | Status: DC | PRN

## 2013-03-12 NOTE — Telephone Encounter (Signed)
I called rx in to pharmacy and when I called to inform pt that it was ready she stated that she already picked it up yesterday.  So I called to speak with pharmacy again and she was just picking up her refill from 11/10/12.  Cancelled current rx due to pt having enough for 1 month.  She is aware that she needs to come in for an appt. Islam Eichinger,CMA

## 2013-03-12 NOTE — Telephone Encounter (Signed)
Please call in Rx for ambien  Let her know to make an appointment with me before  I can give more refills   Thanks  [place]

## 2013-03-16 ENCOUNTER — Other Ambulatory Visit: Payer: Self-pay | Admitting: Neurology

## 2013-03-16 ENCOUNTER — Other Ambulatory Visit: Payer: Self-pay | Admitting: Family Medicine

## 2013-04-10 ENCOUNTER — Telehealth: Payer: Self-pay | Admitting: *Deleted

## 2013-04-10 NOTE — Telephone Encounter (Signed)
NP from [facility] care called to inform Dr. Erin Hearing that pts BP was 171/95 and on recheck was 170/102 and that she also had 2+ protein in her urine.  Pt endorses taking all medications.  Spoke with pt and advised that Dr. Erin Hearing would like to see her in an appt, which was made for Monday.  Pt checks BPs at home but does not record them, she will do so this weekend and bring them to her appt.  Also advised pt of red flags to go to the ED or call 911. [place], Salome Spotted

## 2013-04-13 ENCOUNTER — Encounter: Payer: Self-pay | Admitting: Family Medicine

## 2013-04-13 ENCOUNTER — Ambulatory Visit (INDEPENDENT_AMBULATORY_CARE_PROVIDER_SITE_OTHER): Payer: PRIVATE HEALTH INSURANCE | Admitting: Family Medicine

## 2013-04-13 VITALS — BP 170/90 | HR 60 | Temp 98.1°F | Ht 64.0 in | Wt 165.0 lb

## 2013-04-13 DIAGNOSIS — B079 Viral wart, unspecified: Secondary | ICD-10-CM

## 2013-04-13 DIAGNOSIS — G4700 Insomnia, unspecified: Secondary | ICD-10-CM

## 2013-04-13 DIAGNOSIS — I10 Essential (primary) hypertension: Secondary | ICD-10-CM

## 2013-04-13 LAB — COMPREHENSIVE METABOLIC PANEL
ALT: 26 U/L (ref 0–35)
AST: 26 U/L (ref 0–37)
Albumin: 4.6 g/dL (ref 3.5–5.2)
Alkaline Phosphatase: 70 U/L (ref 39–117)
BUN: 22 mg/dL (ref 6–23)
CO2: 30 mEq/L (ref 19–32)
Calcium: 10 mg/dL (ref 8.4–10.5)
Chloride: 103 mEq/L (ref 96–112)
Creat: 1.07 mg/dL (ref 0.50–1.10)
Glucose, Bld: 104 mg/dL — ABNORMAL HIGH (ref 70–99)
Potassium: 4.5 mEq/L (ref 3.5–5.3)
Sodium: 141 mEq/L (ref 135–145)
Total Bilirubin: 0.9 mg/dL (ref 0.2–1.2)
Total Protein: 7.5 g/dL (ref 6.0–8.3)

## 2013-04-13 NOTE — Assessment & Plan Note (Signed)
Worsened.  She is very unhappy that her insurance will not pay for ambien more than 90 per year.  We discussed reasons for this and no other options .

## 2013-04-13 NOTE — Patient Instructions (Signed)
Good to see you today!  Thanks for coming in.  I will call you if your lab tests are not normal.  Otherwise we will discuss them at your next visit.  Keep checking your blood pressure twice daily and write down  Get your tetanus shot  Use cream or vaseline in your hair

## 2013-04-13 NOTE — Assessment & Plan Note (Signed)
Not well controlled despite CCB, BB. Spironolactone and furosemide.  Had been on ramipril in past stopped due to elevated crt.  Will check labs.  If crt is stable may try low dose ace.

## 2013-04-13 NOTE — Progress Notes (Signed)
   Subjective:    Patient ID: Jodi [name], female    DOB: [birth year], 80 y.o.   MRN: [id number]  HPI HYPERTENSION Disease Monitoring Home BP Monitoring Home nurse measured high blood pressure see notes.  Her own readings are in mid 90s and 140-60s Chest pain- no    Dyspnea- no Medications Compliance-  Taking all medications are ordered she brings in her bottles. Lightheadedness-  no  Edema- no Diet- no recent change no increased salt ROS - See HPI  Insominia Some nights cant sleep at all.  Ambien does help and tries not to take very often.  Does not nap.  Scalp lesion Rough scaly spot on back on head.  Has been there for not sure how long.  Using vaseline or steroid cream intermittently.  Not growing or bleeding   PMH Lab Review   Potassium  Date Value Ref Range Status  08/11/2012 4.2  3.5 - 5.3 mEq/L Final     Sodium  Date Value Ref Range Status  08/11/2012 137  135 - 145 mEq/L Final     Creat  Date Value Ref Range Status  08/11/2012 1.41* 0.50 - 1.10 mg/dL Final     Creatinine, Ser  Date Value Ref Range Status  02/14/2012 1.71* 0.50 - 1.10 mg/dL Final        Review of Symptoms - see HPI  PMH - Smoking status noted.       Review of Systems     Objective:   Physical Exam Alert no acute distress Lungs:  Normal respiratory effort, chest expands symmetrically. Lungs are clear to auscultation, no crackles or wheezes. Heart - Regular rate and rhythm.  Gr 2/6 systolic m, gallops or rubs.    Extremities:  No cyanosis, edema, or deformity noted with good range of motion of all major joints.   Scalp 1x1 cm oval rough warty lesion on occiput        Assessment & Plan:

## 2013-04-13 NOTE — Assessment & Plan Note (Signed)
Most consistent with common wart.  No signs of cancer.  Treat symptomatically she would prefer just to use vaseline unless bothering her more

## 2013-04-14 ENCOUNTER — Telehealth: Payer: Self-pay | Admitting: Family Medicine

## 2013-04-14 MED ORDER — RAMIPRIL 2.5 MG PO CAPS: 2.5000 mg | ORAL_CAPSULE | Freq: Every day | ORAL | Status: DC

## 2013-04-14 NOTE — Telephone Encounter (Signed)
Please make her an appointment to see me on  3-16  Thanks  [place]

## 2013-04-14 NOTE — Telephone Encounter (Signed)
Done Earley Grobe, Salome Spotted

## 2013-04-20 ENCOUNTER — Other Ambulatory Visit: Payer: Self-pay | Admitting: Neurology

## 2013-04-20 ENCOUNTER — Encounter: Payer: Self-pay | Admitting: Family Medicine

## 2013-04-20 ENCOUNTER — Ambulatory Visit (INDEPENDENT_AMBULATORY_CARE_PROVIDER_SITE_OTHER): Payer: PRIVATE HEALTH INSURANCE | Admitting: Family Medicine

## 2013-04-20 VITALS — BP 141/81 | HR 73 | Temp 98.0°F | Ht 64.0 in | Wt 164.2 lb

## 2013-04-20 DIAGNOSIS — I5022 Chronic systolic (congestive) heart failure: Secondary | ICD-10-CM

## 2013-04-20 DIAGNOSIS — I10 Essential (primary) hypertension: Secondary | ICD-10-CM

## 2013-04-20 LAB — BASIC METABOLIC PANEL
BUN: 23 mg/dL (ref 6–23)
CO2: 30 mEq/L (ref 19–32)
Calcium: 9.3 mg/dL (ref 8.4–10.5)
Chloride: 100 mEq/L (ref 96–112)
Creat: 1.27 mg/dL — ABNORMAL HIGH (ref 0.50–1.10)
Glucose, Bld: 126 mg/dL — ABNORMAL HIGH (ref 70–99)
Potassium: 4.3 mEq/L (ref 3.5–5.3)
Sodium: 141 mEq/L (ref 135–145)

## 2013-04-20 NOTE — Assessment & Plan Note (Signed)
Improved control back on low dose Acei.  Will check K and follow very closely

## 2013-04-20 NOTE — Patient Instructions (Signed)
  Good to see you today!  Thanks for coming in.  Your blood pressure is great.    I will call you if your tests are not good.  Otherwise I will send you a letter.  If you do not hear from me with in 2 weeks please call our office.     Try to go to bed a little later or get up earlier  Exercise every day  See Vinnie Level for your wellness visit  See me back in August

## 2013-04-20 NOTE — Progress Notes (Signed)
   Subjective:    Patient ID: Jodi [name], female    DOB: 05-Aug-1932, 80 y.o.   MRN: [id number]  HPI  . HYPERTENSION Disease Monitoring Home BP Monitoring in 130-140/60-70 since on ramipril Chest pain- no    Dyspnea- no Medications Compliance-  Restarted ramipril without problems. Lightheadedness-  no  Edema- no ROS - See HPI  PMH Lab Review   Potassium  Date Value Ref Range Status  04/13/2013 4.5  3.5 - 5.3 mEq/L Final     Sodium  Date Value Ref Range Status  04/13/2013 141  135 - 145 mEq/L Final     Creat  Date Value Ref Range Status  04/13/2013 1.07  0.50 - 1.10 mg/dL Final     Creatinine, Ser  Date Value Ref Range Status  02/14/2012 1.71* 0.50 - 1.10 mg/dL Final        Review of Symptoms - see HPI  PMH - Smoking status noted.       Review of Systems     Objective:   Physical Exam  Alert no acute distress Heart - Regular rate and rhythm.  No murmurs, gallops or rubs.    Lungs:  Normal respiratory effort, chest expands symmetrically. Lungs are clear to auscultation, no crackles or wheezes. Extremities:  No cyanosis, edema, or deformity noted with good range of motion of all major joints.         Assessment & Plan:

## 2013-04-21 ENCOUNTER — Encounter: Payer: Self-pay | Admitting: Family Medicine

## 2013-04-21 LAB — ANEMIA PANEL
%SAT: 25 % (ref 20–55)
ABS Retic: 47.6 K/uL (ref 19.0–186.0)
Ferritin: 115 ng/mL (ref 10–291)
Folate: >20 ng/mL
Iron: 73 ug/dL (ref 42–145)
RBC.: 4.33 MIL/uL (ref 3.87–5.11)
Retic Ct Pct: 1.1 % (ref 0.4–2.3)
TIBC: 291 ug/dL (ref 250–470)
UIBC: 218 ug/dL (ref 125–400)
Vitamin B-12: 1388 pg/mL — ABNORMAL HIGH (ref 211–911)

## 2013-04-24 ENCOUNTER — Ambulatory Visit (INDEPENDENT_AMBULATORY_CARE_PROVIDER_SITE_OTHER): Payer: PRIVATE HEALTH INSURANCE | Admitting: Home Health Services

## 2013-04-24 ENCOUNTER — Encounter: Payer: Self-pay | Admitting: Home Health Services

## 2013-04-24 VITALS — BP 143/83 | HR 83 | Temp 98.4°F | Ht 64.0 in | Wt 164.0 lb

## 2013-04-24 DIAGNOSIS — Z0000 Encounter for general adult medical examination without abnormal findings: Secondary | ICD-10-CM

## 2013-04-24 NOTE — Progress Notes (Signed)
Patient here for annual wellness visit, patient reports: Risk Factors/Conditions needing evaluation or treatment: Pt does not have any new risk factors that need evaluation. Home Safety: Pt lives by self on 1st floor apartment [place].  Pt reports having smoke detectors and does not have adaptive equipment in bathroom. Other Information: Corrective lens: Pt wears corrective lens for reading.  Has regular eye exams. Dentures: Pt has upper and lower partials.  Has regular dental exams. Memory: Pt denies any memory problems. Patient's Mini Mental Score (recorded in doc. flowsheet): 30 BMI/Exercise:  We discussed BMI and strategies for weight loss.  Pt reports daily walking as exercise. Med Adherence:  We discussed importance of taking all prescribed medications daily for HTN and cholesterol.  Pt reports 0 missed days in the past week. ADL/IADL: Pt reports independence in all functions. Bladder:  Pt denies any bladder problems. Takes medications daily. Hearing:  Pt current has a hearing aid in left ear and is being medically supervised by Dr. Rod Can. Balance/Gait: Pt reports no falls in the past year. We discussed strategies for home safety and fall prevention.  No abnormalities were found in gait/balance assessment. Balance Abnormal Patient value  Sitting balance    Sit to stand    Attempts to arise    Immediate standing balance    Standing balance    Nudge    Eyes closed- Romberg    Tandem stance    Back lean    Neck Rotation    360 degree turn    Sitting down     Gait Abnormal Patient value  Initiation of gait    Step length-left    Step length-right    Step height-left    Step height-right    Step symmetry    Step continuity    Path deviation    Trunk movement    Walking stance        Annual Wellness Visit Requirements Recorded Today In  Medical, family, social history Past Medical, Family, Social History Section  Current providers Care team  Current  medications Medications  Wt, BP, Ht, BMI Vital signs  Hearing assessment (welcome visit) Progress Note  Tobacco, alcohol, illicit drug use History  ADL Nurse Assessment  Depression Screening Nurse Assessment  Cognitive impairment Nurse Assessment  Mini Mental Status Document Flowsheet  Fall Risk Fall/Depression  Home Safety Progress Note  End of Life Planning (welcome visit) Social Documentation  Medicare preventative services Progress Note  Risk factors/conditions needing evaluation/treatment Progress Note  Personalized health advice Patient Instructions, goals, letter  Diet & Exercise Social Documentation  Emergency Contact Social Documentation  Seat Belts Social Documentation  Sun exposure/protection Social Documentation    I have reviewed this visit and discussed with Jodi [name] and agree with her documentation CHAMBLISS,MARSHALL L

## 2013-05-19 ENCOUNTER — Telehealth: Payer: Self-pay | Admitting: Family Medicine

## 2013-05-19 MED ORDER — HYDROCODONE-ACETAMINOPHEN 5-325 MG PO TABS: 1.0000 | ORAL_TABLET | Freq: Three times a day (TID) | ORAL | Status: DC | PRN

## 2013-05-19 NOTE — Telephone Encounter (Signed)
LM for patient that rx is ready for pick up. Venetia Prewitt,CMA  

## 2013-05-19 NOTE — Telephone Encounter (Signed)
Will forward to Dr. McDiarmid since he is covering for Dr. Erin Hearing. Donzel Romack,CMA

## 2013-05-19 NOTE — Telephone Encounter (Signed)
Please let patient know her prescription for hydrocodone-APAP is available for pick up from the [facility] front desk.

## 2013-05-19 NOTE — Telephone Encounter (Signed)
Refill request for hydrocodone.

## 2013-05-20 ENCOUNTER — Other Ambulatory Visit: Payer: Self-pay | Admitting: Family Medicine

## 2013-05-20 ENCOUNTER — Other Ambulatory Visit: Payer: Self-pay | Admitting: Neurology

## 2013-06-18 ENCOUNTER — Other Ambulatory Visit: Payer: Self-pay | Admitting: Family Medicine

## 2013-06-19 ENCOUNTER — Telehealth: Payer: Self-pay | Admitting: Family Medicine

## 2013-06-19 NOTE — Telephone Encounter (Signed)
Pt called and needs refills on her Carvedilol and Spironolactone called in. jw

## 2013-06-19 NOTE — Telephone Encounter (Signed)
Patient informed that rx's had been sent in.

## 2013-07-17 ENCOUNTER — Other Ambulatory Visit: Payer: Self-pay | Admitting: Family Medicine

## 2013-07-29 ENCOUNTER — Other Ambulatory Visit (HOSPITAL_COMMUNITY): Payer: Self-pay | Admitting: Interventional Radiology

## 2013-07-29 DIAGNOSIS — N2889 Other specified disorders of kidney and ureter: Secondary | ICD-10-CM

## 2013-07-30 ENCOUNTER — Other Ambulatory Visit: Payer: Self-pay | Admitting: Radiology

## 2013-07-30 DIAGNOSIS — N2889 Other specified disorders of kidney and ureter: Secondary | ICD-10-CM

## 2013-07-31 ENCOUNTER — Telehealth: Payer: Self-pay | Admitting: Family Medicine

## 2013-07-31 MED ORDER — CLOPIDOGREL BISULFATE 75 MG PO TABS: 75.0000 mg | ORAL_TABLET | Freq: Every day | ORAL | Status: DC

## 2013-07-31 NOTE — Telephone Encounter (Signed)
Pt called and would like a refill on her Plavix called in. jw

## 2013-07-31 NOTE — Telephone Encounter (Signed)
Sent in

## 2013-08-06 LAB — CREATININE WITH EST GFR
Creat: 1.27 mg/dL — ABNORMAL HIGH (ref 0.50–1.10)
GFR, Est African American: 47 mL/min — ABNORMAL LOW
GFR, Est Non African American: 41 mL/min — ABNORMAL LOW

## 2013-08-06 LAB — BUN: BUN: 20 mg/dL (ref 6–23)

## 2013-08-17 ENCOUNTER — Other Ambulatory Visit: Payer: Self-pay | Admitting: Family Medicine

## 2013-08-20 ENCOUNTER — Encounter (HOSPITAL_COMMUNITY): Payer: Self-pay | Admitting: Emergency Medicine

## 2013-08-20 ENCOUNTER — Observation Stay (HOSPITAL_COMMUNITY)
Admission: EM | Admit: 2013-08-20 | Discharge: 2013-08-21 | Disposition: A | Discharge status: Home / Self Care | Payer: PRIVATE HEALTH INSURANCE | Attending: Cardiology | Admitting: Cardiology

## 2013-08-20 ENCOUNTER — Emergency Department (HOSPITAL_COMMUNITY): Payer: PRIVATE HEALTH INSURANCE

## 2013-08-20 DIAGNOSIS — I509 Heart failure, unspecified: Secondary | ICD-10-CM | POA: Diagnosis not present

## 2013-08-20 DIAGNOSIS — Z7902 Long term (current) use of antithrombotics/antiplatelets: Secondary | ICD-10-CM | POA: Insufficient documentation

## 2013-08-20 DIAGNOSIS — I4892 Unspecified atrial flutter: Secondary | ICD-10-CM | POA: Diagnosis not present

## 2013-08-20 DIAGNOSIS — R0602 Shortness of breath: Secondary | ICD-10-CM | POA: Insufficient documentation

## 2013-08-20 DIAGNOSIS — I2510 Atherosclerotic heart disease of native coronary artery without angina pectoris: Secondary | ICD-10-CM | POA: Insufficient documentation

## 2013-08-20 DIAGNOSIS — Z9889 Other specified postprocedural states: Secondary | ICD-10-CM | POA: Insufficient documentation

## 2013-08-20 DIAGNOSIS — I252 Old myocardial infarction: Secondary | ICD-10-CM | POA: Diagnosis not present

## 2013-08-20 DIAGNOSIS — R51 Headache: Secondary | ICD-10-CM | POA: Diagnosis not present

## 2013-08-20 DIAGNOSIS — N189 Chronic kidney disease, unspecified: Secondary | ICD-10-CM | POA: Insufficient documentation

## 2013-08-20 DIAGNOSIS — I480 Paroxysmal atrial fibrillation: Secondary | ICD-10-CM | POA: Diagnosis present

## 2013-08-20 DIAGNOSIS — R55 Syncope and collapse: Secondary | ICD-10-CM | POA: Diagnosis present

## 2013-08-20 DIAGNOSIS — I129 Hypertensive chronic kidney disease with stage 1 through stage 4 chronic kidney disease, or unspecified chronic kidney disease: Secondary | ICD-10-CM | POA: Insufficient documentation

## 2013-08-20 DIAGNOSIS — I4891 Unspecified atrial fibrillation: Secondary | ICD-10-CM | POA: Diagnosis not present

## 2013-08-20 DIAGNOSIS — Z85528 Personal history of other malignant neoplasm of kidney: Secondary | ICD-10-CM | POA: Diagnosis not present

## 2013-08-20 DIAGNOSIS — Z888 Allergy status to other drugs, medicaments and biological substances status: Secondary | ICD-10-CM | POA: Diagnosis not present

## 2013-08-20 DIAGNOSIS — Z79899 Other long term (current) drug therapy: Secondary | ICD-10-CM | POA: Diagnosis not present

## 2013-08-20 DIAGNOSIS — R011 Cardiac murmur, unspecified: Secondary | ICD-10-CM | POA: Insufficient documentation

## 2013-08-20 DIAGNOSIS — R42 Dizziness and giddiness: Secondary | ICD-10-CM | POA: Diagnosis not present

## 2013-08-20 DIAGNOSIS — Z8673 Personal history of transient ischemic attack (TIA), and cerebral infarction without residual deficits: Secondary | ICD-10-CM | POA: Diagnosis not present

## 2013-08-20 DIAGNOSIS — E7800 Pure hypercholesterolemia, unspecified: Secondary | ICD-10-CM | POA: Diagnosis not present

## 2013-08-20 DIAGNOSIS — R000 Tachycardia, unspecified: Secondary | ICD-10-CM | POA: Insufficient documentation

## 2013-08-20 DIAGNOSIS — Z951 Presence of aortocoronary bypass graft: Secondary | ICD-10-CM | POA: Insufficient documentation

## 2013-08-20 LAB — BASIC METABOLIC PANEL
Anion gap: 19 — ABNORMAL HIGH (ref 5–15)
BUN: 20 mg/dL (ref 6–23)
CO2: 21 mEq/L (ref 19–32)
Calcium: 10.2 mg/dL (ref 8.4–10.5)
Chloride: 97 mEq/L (ref 96–112)
Creatinine, Ser: 1.12 mg/dL — ABNORMAL HIGH (ref 0.50–1.10)
GFR calc Af Amer: 53 mL/min — ABNORMAL LOW (ref >90)
GFR calc non Af Amer: 46 mL/min — ABNORMAL LOW (ref >90)
Glucose, Bld: 116 mg/dL — ABNORMAL HIGH (ref 70–99)
Potassium: 5.1 mEq/L (ref 3.7–5.3)
Sodium: 137 mEq/L (ref 137–147)

## 2013-08-20 LAB — CBC WITH DIFFERENTIAL/PLATELET
Basophils Absolute: 0.1 10*3/uL (ref 0.0–0.1)
Basophils Relative: 1 % (ref 0–1)
Eosinophils Absolute: 0.3 10*3/uL (ref 0.0–0.7)
Eosinophils Relative: 4 % (ref 0–5)
HCT: 40.5 % (ref 36.0–46.0)
Hemoglobin: 14.1 g/dL (ref 12.0–15.0)
Lymphocytes Relative: 37 % (ref 12–46)
Lymphs Abs: 2.9 10*3/uL (ref 0.7–4.0)
MCH: 31.3 pg (ref 26.0–34.0)
MCHC: 34.8 g/dL (ref 30.0–36.0)
MCV: 90 fL (ref 78.0–100.0)
Monocytes Absolute: 1 10*3/uL (ref 0.1–1.0)
Monocytes Relative: 13 % — ABNORMAL HIGH (ref 3–12)
Neutro Abs: 3.5 10*3/uL (ref 1.7–7.7)
Neutrophils Relative %: 45 % (ref 43–77)
Platelets: 221 10*3/uL (ref 150–400)
RBC: 4.5 MIL/uL (ref 3.87–5.11)
RDW: 12.8 % (ref 11.5–15.5)
WBC: 7.8 10*3/uL (ref 4.0–10.5)

## 2013-08-20 LAB — URINALYSIS, ROUTINE W REFLEX MICROSCOPIC
Bilirubin Urine: NEGATIVE
Glucose, UA: NEGATIVE mg/dL
Hgb urine dipstick: NEGATIVE
Ketones, ur: NEGATIVE mg/dL
Nitrite: NEGATIVE
Protein, ur: 100 mg/dL — AB
Specific Gravity, Urine: 1.009 (ref 1.005–1.030)
Urobilinogen, UA: 0.2 mg/dL (ref 0.0–1.0)
pH: 7.5 (ref 5.0–8.0)

## 2013-08-20 LAB — TROPONIN I
Troponin I: <0.3 ng/mL (ref <0.30)
Troponin I: <0.3 ng/mL (ref <0.30)

## 2013-08-20 LAB — URINE MICROSCOPIC-ADD ON

## 2013-08-20 LAB — CBG MONITORING, ED: Glucose-Capillary: 116 mg/dL — ABNORMAL HIGH (ref 70–99)

## 2013-08-20 LAB — TSH: TSH: 1.07 u[IU]/mL (ref 0.350–4.500)

## 2013-08-20 MED ORDER — NITROGLYCERIN 0.4 MG SL SUBL
SUBLINGUAL_TABLET | SUBLINGUAL | Status: AC
  Filled 2013-08-20: qty 1

## 2013-08-20 MED ORDER — SPIRONOLACTONE 25 MG PO TABS
25.0000 mg | ORAL_TABLET | Freq: Every day | ORAL | Status: DC
  Administered 2013-08-21: 25 mg via ORAL
  Filled 2013-08-20: qty 1

## 2013-08-20 MED ORDER — ATORVASTATIN CALCIUM 20 MG PO TABS
20.0000 mg | ORAL_TABLET | Freq: Every day | ORAL | Status: DC
  Filled 2013-08-20: qty 1

## 2013-08-20 MED ORDER — CARVEDILOL 6.25 MG PO TABS
6.2500 mg | ORAL_TABLET | Freq: Two times a day (BID) | ORAL | Status: DC
  Administered 2013-08-21: 6.25 mg via ORAL
  Filled 2013-08-20 (×3): qty 1

## 2013-08-20 MED ORDER — RAMIPRIL 2.5 MG PO CAPS
2.5000 mg | ORAL_CAPSULE | Freq: Every day | ORAL | Status: DC
  Administered 2013-08-21: 2.5 mg via ORAL
  Filled 2013-08-20: qty 1

## 2013-08-20 MED ORDER — ASPIRIN 81 MG PO CHEW
CHEWABLE_TABLET | ORAL | Status: AC
  Administered 2013-08-20: 324 mg
  Filled 2013-08-20: qty 4

## 2013-08-20 MED ORDER — APIXABAN 5 MG PO TABS
5.0000 mg | ORAL_TABLET | Freq: Two times a day (BID) | ORAL | Status: DC
  Administered 2013-08-20 – 2013-08-21 (×2): 5 mg via ORAL
  Filled 2013-08-20 (×3): qty 1

## 2013-08-20 MED ORDER — ONDANSETRON HCL 4 MG/2ML IJ SOLN
INTRAMUSCULAR | Status: AC
  Filled 2013-08-20: qty 2

## 2013-08-20 MED ORDER — LORAZEPAM 2 MG/ML IJ SOLN
0.5000 mg | Freq: Once | INTRAMUSCULAR | Status: AC
  Administered 2013-08-20: 0.5 mg via INTRAVENOUS
  Filled 2013-08-20: qty 1

## 2013-08-20 MED ORDER — HEPARIN SODIUM (PORCINE) 5000 UNIT/ML IJ SOLN
INTRAMUSCULAR | Status: AC
  Filled 2013-08-20: qty 1

## 2013-08-20 MED ORDER — AMLODIPINE BESYLATE 10 MG PO TABS
10.0000 mg | ORAL_TABLET | Freq: Every day | ORAL | Status: DC
  Administered 2013-08-21: 10 mg via ORAL
  Filled 2013-08-20: qty 1

## 2013-08-20 MED ORDER — MORPHINE SULFATE 4 MG/ML IJ SOLN
INTRAMUSCULAR | Status: AC
  Filled 2013-08-20: qty 1

## 2013-08-20 MED ORDER — FENTANYL CITRATE 0.05 MG/ML IJ SOLN
50.0000 ug | Freq: Once | INTRAMUSCULAR | Status: AC
  Administered 2013-08-20: 50 ug via INTRAVENOUS
  Filled 2013-08-20: qty 2

## 2013-08-20 NOTE — ED Notes (Signed)
No changes, pt alert, NAD< calm, speaking with family x2 at [facility]. No dyspnea. Denies CP. Skin W&D. resps e/u. VSS. Pending blood and urine results, pending pCXR.

## 2013-08-20 NOTE — ED Notes (Signed)
Here by [facility], called a STEMI on arrival, denies CP, called out for near-syncope, reported diaphoresis, HA & sob. Card contacted and STEMI cancelled. Progressing with new onset afib. Pt alert, NAD, calm, interactive, (denies: CP, sob, nausea or dizziness), endorses HA at this time. Dr. Dina Rich present on arrival. No dyspnea noted, skin W&D. Pt attempted BP on arrival d/t "have to void".

## 2013-08-20 NOTE — ED Notes (Signed)
CXR completed. Pt up to b/r recently, family at [facility]. Pt returned to monitor. HA continues.

## 2013-08-20 NOTE — ED Provider Notes (Signed)
CSN: [id number]     Arrival date & time 08/20/13  1949 History   First MD Initiated Contact with Patient 08/20/13 1951     Chief Complaint  Patient presents with  . Near Syncope  . Atrial Fibrillation  . Shortness of Breath     (Consider location/radiation/quality/duration/timing/severity/associated sxs/prior Treatment) HPI  This is a 80 year old female with a history of coronary artery disease, hypertension, chronic kidney disease, congestive heart failure who presents with near-syncope. Per EMS, they were called for near-syncope. Patient was diaphoretic and complaining of headache and shortness of breath. EKG showed ST elevations in the anterior leads and atrial fibrillation with RVR. Patient denies any chest pain. Code STEMI was activated from EMS.   On my evaluation, patient has no complaints. She states that she's been having waxing and waning symptoms of feeling like like she was going to pass out and lightheaded. She denies any recent fevers or other symptoms. Past Medical History  Diagnosis Date  . Myocardial infarction   . Coronary artery disease   . Hypertension   . Heart murmur   . Chronic kidney disease   . Aortic stenosis   . CHF (congestive heart failure)   . Hypercholesteremia   . Shortness of breath   . Stroke   . Renal mass 12/06/2010    CT Abdomen 11-27-10 upper pole region of the right kidney which is suspicious for solid lesion, measuring 1.9 x 1.3 cm. This lesion is concerning for renal cell carcinoma given the solid appearance.  Following with Dr [name].  Renal bx was benign    . Splenic infarct 12/06/2010  . Renal cell carcinoma 09/23/2012   Past Surgical History  Procedure Laterality Date  . Radical hysterectomy    . Coronary artery bypass graft  1995 and 2003  . Abdominal hysterectomy    . Eye surgery    . Cardiac catheterization     Family History  Problem Relation Age of Onset  . Coronary artery disease Mother   . Coronary artery disease Brother    . Stroke Mother    History  Substance Use Topics  . Smoking status: Never Smoker   . Smokeless tobacco: Never Used  . Alcohol Use: No   OB History   Grav Para Term Preterm Abortions TAB SAB Ect Mult Living                 Review of Systems  Constitutional: Negative for fever.  Respiratory: Negative for cough, chest tightness and shortness of breath.   Cardiovascular: Negative for chest pain and leg swelling.  Gastrointestinal: Negative for nausea, vomiting and abdominal pain.  Genitourinary: Negative for dysuria.  Musculoskeletal: Negative for back pain.  Neurological: Positive for light-headedness and headaches.       Near syncope  Psychiatric/Behavioral: Negative for confusion.  All other systems reviewed and are negative.     Allergies  Sulfamethoxazole-trimethoprim  Home Medications   Prior to Admission medications   Medication Sig Start Date End Date Taking? Authorizing Provider  amLODipine (NORVASC) 10 MG tablet Take 10 mg by mouth daily.   Yes Historical Provider, MD  beta carotene w/minerals (OCUVITE) tablet Take 1 tablet by mouth daily.   Yes Historical Provider, MD  BIOTIN PO Take 1 tablet by mouth daily.   Yes Historical Provider, MD  carvedilol (COREG) 6.25 MG tablet Take 6.25 mg by mouth 2 (two) times daily with a meal.   Yes Historical Provider, MD  cholecalciferol (VITAMIN D) 1000 UNITS tablet Take  2,000 Units by mouth daily.    Yes Historical Provider, MD  clopidogrel (PLAVIX) 75 MG tablet Take 1 tablet (75 mg total) by mouth daily. 07/31/13  Yes [name], MD  CRANBERRY PO Take 1 tablet by mouth daily.   Yes Historical Provider, MD  Multiple Vitamin (MULTIVITAMIN) capsule Take 1 capsule by mouth daily.     Yes Historical Provider, MD  ramipril (ALTACE) 2.5 MG capsule Take 2.5 mg by mouth daily.   Yes Historical Provider, MD  rosuvastatin (CRESTOR) 20 MG tablet Take 20 mg by mouth daily.   Yes Historical Provider, MD  spironolactone (ALDACTONE)  25 MG tablet Take 25 mg by mouth daily.   Yes Historical Provider, MD   BP 155/75  Pulse 58  Resp 19  SpO2 93% Physical Exam  Nursing note and vitals reviewed. Constitutional: She is oriented to person, place, and time. No distress.  ELderly  HENT:  Head: Normocephalic and atraumatic.  Eyes: Pupils are equal, round, and reactive to light.  Neck: Neck supple.  Cardiovascular: Normal heart sounds.   Irregularly irregular rhythm, tachycardia  Pulmonary/Chest: Effort normal and breath sounds normal. No respiratory distress. She has no wheezes.  Abdominal: Soft. Bowel sounds are normal. There is no tenderness. There is no rebound.  Musculoskeletal: She exhibits no edema.  Neurological: She is alert and oriented to person, place, and time.  Skin: Skin is warm and dry.  Psychiatric: She has a normal mood and affect.    ED Course  Procedures (including critical care time) Labs Review Labs Reviewed  CBC WITH DIFFERENTIAL - Abnormal; Notable for the following:    Monocytes Relative 13 (*)    All other components within normal limits  BASIC METABOLIC PANEL - Abnormal; Notable for the following:    Glucose, Bld 116 (*)    Creatinine, Ser 1.12 (*)    GFR calc non Af Amer 46 (*)    GFR calc Af Amer 53 (*)    Anion gap 19 (*)    All other components within normal limits  URINALYSIS, ROUTINE W REFLEX MICROSCOPIC - Abnormal; Notable for the following:    APPearance CLOUDY (*)    Protein, ur 100 (*)    Leukocytes, UA SMALL (*)    All other components within normal limits  CBG MONITORING, ED - Abnormal; Notable for the following:    Glucose-Capillary 116 (*)    All other components within normal limits  TROPONIN I  URINE MICROSCOPIC-ADD ON  TROPONIN I  TROPONIN I  TROPONIN I  TSH    Imaging Review Dg Chest Portable 1 View  08/20/2013   CLINICAL DATA:  Near syncope  EXAM: PORTABLE CHEST - 1 VIEW  COMPARISON:  08/20/2013  FINDINGS: Chronic cardiomegaly. Status post CABG with  stenting, likely of a venous graft. Negative upper mediastinal contours. There is no edema, consolidation, effusion, or pneumothorax.  IMPRESSION: No active disease.   Electronically Signed   By: Jorje Guild M.D.   On: 08/20/2013 20:59     EKG Interpretation   Date/Time:  Thursday August 20 2013 19:53:05 EDT Ventricular Rate:  114 PR Interval:    QRS Duration: 103 QT Interval:  334 QTC Calculation: 460 R Axis:   49 Text Interpretation:  Atrial flutter Abnormal T, consider ischemia,  lateral leads New onset atrial flutter/fib Confirmed by Rosanne Wohlfarth  MD,  Loma Sousa ([id number]) on 08/20/2013 10:39:36 PM      MDM   Final diagnoses:  Atrial fibrillation and flutter  Near  syncope    Patient presents with near syncope. Code STEMI called based on EMS EKG prior to my arrival. I reviewed EMS EKG and it shows atrial flutter with RVR and isolated ST elevations in the anterior leads without reciprocal changes. Repeat EKG is reassuring and without ST elevations. Discuss with Dr. Einar Gip, who agrees the patient does not meet STEMI criteria. She is currently having no complaints of chest pain or shortness of breath. Initial troponin is negative.  Chest x-ray and other workup is reassuring. She has been in a rate-controlled atrial fibrillation/flutter while in the ER with rates from 90-110. She will be admitted to Dr. Irven Shelling service.     Merryl Hacker, MD 08/20/13 2241

## 2013-08-20 NOTE — ED Notes (Signed)
Pt resting, arousable to voice, intermitently interactive with family at [facility], updated, pending bed assignment. No changes. Now NSR/SB on monitor.

## 2013-08-20 NOTE — ED Notes (Signed)
Pt denies changes. Dr. Dina Rich speaking with pt & family at [facility].

## 2013-08-21 ENCOUNTER — Other Ambulatory Visit: Payer: Self-pay

## 2013-08-21 DIAGNOSIS — R55 Syncope and collapse: Secondary | ICD-10-CM | POA: Diagnosis not present

## 2013-08-21 LAB — TROPONIN I
Troponin I: <0.3 ng/mL (ref <0.30)
Troponin I: <0.3 ng/mL (ref <0.30)

## 2013-08-21 MED ORDER — APIXABAN 5 MG PO TABS: 5.0000 mg | ORAL_TABLET | Freq: Two times a day (BID) | ORAL | Status: DC

## 2013-08-21 MED ORDER — ACETAMINOPHEN 325 MG PO TABS
650.0000 mg | ORAL_TABLET | ORAL | Status: DC | PRN
  Administered 2013-08-21: 650 mg via ORAL
  Filled 2013-08-21: qty 2

## 2013-08-21 MED ORDER — FENTANYL CITRATE 0.05 MG/ML IJ SOLN
50.0000 ug | Freq: Once | INTRAMUSCULAR | Status: AC
  Administered 2013-08-21: 50 ug via INTRAVENOUS
  Filled 2013-08-21: qty 2

## 2013-08-21 NOTE — Discharge Summary (Signed)
Physician Discharge Summary  Patient ID: Jodi [name] MRN: [id number] DOB/AGE: 78-05-1932 78 y.o.  Admit date: 08/20/2013 Discharge date: 08/21/2013  Primary Discharge Diagnosis 1. Near syncope, suspect this may be related to either atrial fibrillation or sick sinus syndrome. 2. Paroxysmal atrial fibrillation, patient back in sinus rhythm this morning.  CHA2DS2-VASCScore: Risk Score 5, Yearly risk of stroke 6.7%/year. Recommendation: ASA No/Anticoagulation Yes  [place] Has Bled: Score 2. Estimated bleeding risk: 2%  (1.8-3.2% per year).  Secondary Discharge Diagnosis 3. CAD in native artery.  CAD/ASHD: CABG in 1994. Stents in 2002. H/O Redo CABG 06/05/01 consisting of a patent LIMA to the LAD, with free RIMA to RI, SVG to OM1, SVG to PL A, SVG to ramus intermediate branch Dr. [name].  Lexiscan stress 12/15/10: Inferior, septal and distal anterior and apical scar. No ischemia. EF 47%.  4. Aortic stenosis  Echo- 01/22/13  1. Left ventricular cavity is normal in size. Mild to moderate concentric hypertrophy. Mild global hypokinesis. Mildly decreased systolic global function. Calculated EF 37%. Visually estimated EF is approx. 45 %.  2. Left atrial cavity is moderately dilated.  3. Moderate aortic stenosis. Mild aortic valve leaflet calcification. Aortic valve leaflet mobility is moderately restricted. Compared to echo. of 12/30/12, no significant change in severity of AS. Peak & mean gradients are less, which appears to be due to technical reasons, there is no significant change in valve area.  4. Moderate calcification of the mitral annulus. Mild to moderate mitral regurgitation. Mitral valve inflow A > E ratio.  5. Tricuspid valve structurally normal. Mild tricuspid regurgitation. Mild pulmonary hypertension with approx. PA syst. pressure of 40 mm of Hg.  6. c.f. echo. of 12/31/11, mild Pul. HTN is new, no other diagnostic change.  5. Hyperlipidemia, mixed.  6. Chronic renal insufficiency,  stage III chronic kidney disease, eGFR 53 mL.  7. Left-sided headache, no visual disturbances, no neurologic deficits, no history of fall. Started with intraocular injections for macular degeneration. I Cannot suspect intracranial abnormality.  8. Bilateral carotid artery bruit  Carotid duplex 02/28/12: No evidence of hemodynamically significant stenosis in the bilateral carotid bifurcation vessels. There is evidence of homogeneous plaque in the bilateral carotid artery. Left ICA demonstrates mild tortuosity.  Hospital Course: Patient admitted through emergency room with near-syncope, rule out for myocardial infarction, patient spontaneously converted to sinus rhythm. Felt stable for discharge the following morning. Please see my history and physical for complete details.  Recommendations on discharge: Outpatient event monitoring for 30 days, patient started on 5 mg of Eliquis, outpatient echocardiogram will be set up along with outpatient followup.  Discharge Exam: Blood pressure 165/62, pulse 58, temperature 97.6 F (36.4 C), temperature source Oral, resp. rate 20, height 5' 3"  (1.6 m), weight 72.848 kg (160 lb 9.6 oz), SpO2 98.00%.   General appearance: alert, cooperative, appears stated age and no distress  Eyes: negative findings: lids and lashes normal  Neck: no adenopathy, no JVD, supple, symmetrical, trachea midline and thyroid not enlarged, symmetric, no tenderness/mass/nodules  Neck: JVP - normal, carotids 2+= without bruits  Resp: clear to auscultation bilaterally  Chest wall: no tenderness  Cardio: S1, S2 normal, no S3 or S4, systolic murmur: early systolic 3/6, crescendo and decrescendo at 2nd right intercostal space, at apex and no rub  GI: soft, non-tender; bowel sounds normal; no masses, no organomegaly  Extremities: extremities normal, atraumatic, no cyanosis or edema   Labs:   Lab Results  Component Value Date   WBC 7.8 08/20/2013  HGB 14.1 08/20/2013   HCT 40.5  08/20/2013   MCV 90.0 08/20/2013   PLT 221 08/20/2013    Recent Labs Lab 08/20/13 2000  NA 137  K 5.1  CL 97  CO2 21  BUN 20  CREATININE 1.12*  CALCIUM 10.2  GLUCOSE 116*   Lab Results  Component Value Date   TROPONINI <0.30 08/21/2013    Lipid Panel     Component Value Date/Time   CHOL 181 11/10/2012 1345   TRIG 247* 11/10/2012 1345   HDL 50 11/10/2012 1345   CHOLHDL 3.6 11/10/2012 1345   VLDL 49* 11/10/2012 1345   LDLCALC 82 11/10/2012 1345   EKG: EKG 716/2014: Atrial fibrillation with rapid ventricular response, T. inversion in 1 and aVL, borderline voltage criteria for LVH. Compared to 02/11/2012, atrial fibrillation is new. T. inversion unchanged.  EKG 08/21/2012: Normal sinus rhythm, LVH, borderline criteria. T. inversion in lateral leads, 1 and aVL suggest LVH with repolarization abnormality versus ischemia, unchanged from prior EKG.    Radiology: Dg Chest Portable 1 View  08/20/2013   CLINICAL DATA:  Near syncope  EXAM: PORTABLE CHEST - 1 VIEW  COMPARISON:  08/20/2013  FINDINGS: Chronic cardiomegaly. Status post CABG with stenting, likely of a venous graft. Negative upper mediastinal contours. There is no edema, consolidation, effusion, or pneumothorax.  IMPRESSION: No active disease.   Electronically Signed   By: Jorje Guild M.D.   On: 08/20/2013 20:59      FOLLOW UP PLANS AND APPOINTMENTS    Medication List    STOP taking these medications       clopidogrel 75 MG tablet  Commonly known as:  PLAVIX      TAKE these medications       amLODipine 10 MG tablet  Commonly known as:  NORVASC  Take 10 mg by mouth daily.     apixaban 5 MG Tabs tablet  Commonly known as:  ELIQUIS  Take 1 tablet (5 mg total) by mouth 2 (two) times daily.     beta carotene w/minerals tablet  Take 1 tablet by mouth daily.     BIOTIN PO  Take 1 tablet by mouth daily.     carvedilol 6.25 MG tablet  Commonly known as:  COREG  Take 6.25 mg by mouth 2 (two) times daily with a meal.      cholecalciferol 1000 UNITS tablet  Commonly known as:  VITAMIN D  Take 2,000 Units by mouth daily.     CRANBERRY PO  Take 1 tablet by mouth daily.     multivitamin capsule  Take 1 capsule by mouth daily.     ramipril 2.5 MG capsule  Commonly known as:  ALTACE  Take 2.5 mg by mouth daily.     rosuvastatin 20 MG tablet  Commonly known as:  CRESTOR  Take 20 mg by mouth daily.     spironolactone 25 MG tablet  Commonly known as:  ALDACTONE  Take 25 mg by mouth daily.           Follow-up Information   Follow up with Laverda Page, MD. (Go to our office today for Event monitor for 30 days then f/u with me. Echo will also be ordered.)    Specialty:  Cardiology   Contact information:   [address] [place] [address] [phone]        Laverda Page, MD 08/21/2013, 12:14 PM  Pager: [phone] Office: [phone] If no answer: [phone]

## 2013-08-21 NOTE — Progress Notes (Signed)
Utilization review completed.  

## 2013-08-21 NOTE — Progress Notes (Signed)
Patient discharged to home.  Patient alert, oriented, verbally responsive, breathing regular and non-labored throughout, no s/s of distress noted throughout, no c/o pain throughout.  Discharge instructions thoroughly verbalized to patient and daughter at bedside.  Both verbalized understanding throughout.  Patient stating that she no longer wants to wait for case manager to see her to receive her 30-day voucher for Eliquis.  Patient informed of importance to stay to receive Eliquis voucher and the unknown expense of medication.  Patient and daughter refusing to stay and want to leave hospital now.  Patient has appointment with Dr. Einar Gip immediately following discharge.  Patient told to inform Dr. Einar Gip about lack of voucher during appointment to see if he is able to provide.  Patient left unit per wheelchair accompanied by nurse Cloretta Ned and son.    Dirk Dress 08/21/2013

## 2013-08-21 NOTE — H&P (Addendum)
Jodi [name] is an 80 y.o. female.   Chief Complaint: Near syncope HPI: Patient is a fairly active 80 year old Caucasian female with history of known coronary artery disease and CABG, moderate aortic stenosis, history of acute renal insufficiency about a year ago, etiology not clearly understood, which is resolved, had been doing well until about 6-8 weeks ago she started to notice sudden onset of near syncopal spells. Described "everything turned fuzzy and black" and last a few seconds. She was sitting and talking with a neighbor last evening, suddenly felt episode coming and 10 minutes later she had a second episode each lasting a few seconds. She also felt mildly clammy, but denied any chest pain or shortness of breath. She denied any palpitations. Otherwise patient states that he's been doing well and has been carrying along her usual activities without any limitations. No leg edema, no urinary or bowel disturbances. No history of frank syncope.  She has macular degeneration and undergoing experimental intraocular injections on a weekly basis, since then patient states that she's been having episodes, also states that she's been having left-sided headaches occasionally. She continues to have headache this morning. No visual disturbances. No nausea or vomiting. No neurologic deficits.   Past Medical History  Diagnosis Date  . Myocardial infarction   . Coronary artery disease   . Hypertension   . Heart murmur   . Chronic kidney disease   . Aortic stenosis   . CHF (congestive heart failure)   . Hypercholesteremia   . Shortness of breath   . Stroke   . Renal mass 12/06/2010    CT Abdomen 11-27-10 upper pole region of the right kidney which is suspicious for solid lesion, measuring 1.9 x 1.3 cm. This lesion is concerning for renal cell carcinoma given the solid appearance.  Following with Dr [name].  Renal bx was benign    . Splenic infarct 12/06/2010  . Renal cell carcinoma 09/23/2012     Past Surgical History  Procedure Laterality Date  . Radical hysterectomy    . Coronary artery bypass graft  1995 and 2003  . Abdominal hysterectomy    . Eye surgery    . Cardiac catheterization      Family History  Problem Relation Age of Onset  . Coronary artery disease Mother   . Coronary artery disease Brother   . Stroke Mother    Social History:  reports that she has never smoked. She has never used smokeless tobacco. She reports that she does not drink alcohol or use illicit drugs.  Allergies:  Allergies  Allergen Reactions  . Sulfamethoxazole-Trimethoprim Other (See Comments)    REACTION: shaking /chills    Medications Prior to Admission  Medication Sig Dispense Refill  . amLODipine (NORVASC) 10 MG tablet Take 10 mg by mouth daily.      . beta carotene w/minerals (OCUVITE) tablet Take 1 tablet by mouth daily.      Marland Kitchen BIOTIN PO Take 1 tablet by mouth daily.      . carvedilol (COREG) 6.25 MG tablet Take 6.25 mg by mouth 2 (two) times daily with a meal.      . cholecalciferol (VITAMIN D) 1000 UNITS tablet Take 2,000 Units by mouth daily.       . clopidogrel (PLAVIX) 75 MG tablet Take 1 tablet (75 mg total) by mouth daily.  30 tablet  6  . CRANBERRY PO Take 1 tablet by mouth daily.      . Multiple Vitamin (MULTIVITAMIN) capsule Take 1  capsule by mouth daily.        . ramipril (ALTACE) 2.5 MG capsule Take 2.5 mg by mouth daily.      . rosuvastatin (CRESTOR) 20 MG tablet Take 20 mg by mouth daily.      Marland Kitchen spironolactone (ALDACTONE) 25 MG tablet Take 25 mg by mouth daily.       Review of Systems - denies any bowel or bladder disturbances, denies any recent weight changes, no leg edema of painful swelling of the lower extremities, no focal neurologic deficits, no new visual disturbances, no symptoms to suggest claudication, other systems negative.   Blood pressure 165/62, pulse 58, temperature 97.6 F (36.4 C), temperature source Oral, resp. rate 20, height 5' 3"  (1.6 m),  weight 72.848 kg (160 lb 9.6 oz), SpO2 98.00%. General appearance: alert, cooperative, appears stated age and no distress Eyes: negative findings: lids and lashes normal Neck: no adenopathy, no JVD, supple, symmetrical, trachea midline and thyroid not enlarged, symmetric, no tenderness/mass/nodules Neck: JVP - normal, carotids 2+= without bruits Resp: clear to auscultation bilaterally Chest wall: no tenderness Cardio: S1, S2 normal, no S3 or S4, systolic murmur: early systolic 3/6, crescendo and decrescendo at 2nd right intercostal space, at apex and no rub GI: soft, non-tender; bowel sounds normal; no masses,  no organomegaly Extremities: extremities normal, atraumatic, no cyanosis or edema Pulses: 2+ and symmetric, faint bilateral carotid bruit present, conducted sounds from the aortic valve. Skin: Skin color, texture, turgor normal. No rashes or lesions Neurologic: Alert and oriented X 3, normal strength and tone. Normal symmetric reflexes. Normal coordination and gait  Results for orders placed during the hospital encounter of 08/20/13 (from the past 48 hour(s))  CBG MONITORING, ED     Status: Abnormal   Collection Time    08/20/13  7:59 PM      Result Value Ref Range   Glucose-Capillary 116 (*) 70 - 99 mg/dL  CBC WITH DIFFERENTIAL     Status: Abnormal   Collection Time    08/20/13  8:00 PM      Result Value Ref Range   WBC 7.8  4.0 - 10.5 K/uL   RBC 4.50  3.87 - 5.11 MIL/uL   Hemoglobin 14.1  12.0 - 15.0 g/dL   HCT 40.5  36.0 - 46.0 %   MCV 90.0  78.0 - 100.0 fL   MCH 31.3  26.0 - 34.0 pg   MCHC 34.8  30.0 - 36.0 g/dL   RDW 12.8  11.5 - 15.5 %   Platelets 221  150 - 400 K/uL   Neutrophils Relative % 45  43 - 77 %   Neutro Abs 3.5  1.7 - 7.7 K/uL   Lymphocytes Relative 37  12 - 46 %   Lymphs Abs 2.9  0.7 - 4.0 K/uL   Monocytes Relative 13 (*) 3 - 12 %   Monocytes Absolute 1.0  0.1 - 1.0 K/uL   Eosinophils Relative 4  0 - 5 %   Eosinophils Absolute 0.3  0.0 - 0.7 K/uL    Basophils Relative 1  0 - 1 %   Basophils Absolute 0.1  0.0 - 0.1 K/uL  BASIC METABOLIC PANEL     Status: Abnormal   Collection Time    08/20/13  8:00 PM      Result Value Ref Range   Sodium 137  137 - 147 mEq/L   Potassium 5.1  3.7 - 5.3 mEq/L   Chloride 97  96 - 112 mEq/L  CO2 21  19 - 32 mEq/L   Glucose, Bld 116 (*) 70 - 99 mg/dL   BUN 20  6 - 23 mg/dL   Creatinine, Ser 1.12 (*) 0.50 - 1.10 mg/dL   Calcium 10.2  8.4 - 10.5 mg/dL   GFR calc non Af Amer 46 (*) >90 mL/min   GFR calc Af Amer 53 (*) >90 mL/min   Comment: (NOTE)     The eGFR has been calculated using the CKD EPI equation.     This calculation has not been validated in all clinical situations.     eGFR's persistently <90 mL/min signify possible Chronic Kidney     Disease.   Anion gap 19 (*) 5 - 15  TROPONIN I     Status: None   Collection Time    08/20/13  8:00 PM      Result Value Ref Range   Troponin I <0.30  <0.30 ng/mL   Comment:            Due to the release kinetics of cTnI,     a negative result within the first hours     of the onset of symptoms does not rule out     myocardial infarction with certainty.     If myocardial infarction is still suspected,     repeat the test at appropriate intervals.  URINALYSIS, ROUTINE W REFLEX MICROSCOPIC     Status: Abnormal   Collection Time    08/20/13  8:00 PM      Result Value Ref Range   Color, Urine YELLOW  YELLOW   APPearance CLOUDY (*) CLEAR   Specific Gravity, Urine 1.009  1.005 - 1.030   pH 7.5  5.0 - 8.0   Glucose, UA NEGATIVE  NEGATIVE mg/dL   Hgb urine dipstick NEGATIVE  NEGATIVE   Bilirubin Urine NEGATIVE  NEGATIVE   Ketones, ur NEGATIVE  NEGATIVE mg/dL   Protein, ur 100 (*) NEGATIVE mg/dL   Urobilinogen, UA 0.2  0.0 - 1.0 mg/dL   Nitrite NEGATIVE  NEGATIVE   Leukocytes, UA SMALL (*) NEGATIVE  URINE MICROSCOPIC-ADD ON     Status: None   Collection Time    08/20/13  8:00 PM      Result Value Ref Range   Squamous Epithelial / LPF RARE  RARE    WBC, UA 0-2  <3 WBC/hpf   RBC / HPF 0-2  <3 RBC/hpf   Bacteria, UA RARE  RARE  TSH     Status: None   Collection Time    08/20/13  8:00 PM      Result Value Ref Range   TSH 1.070  0.350 - 4.500 uIU/mL  TROPONIN I     Status: None   Collection Time    08/20/13 10:08 PM      Result Value Ref Range   Troponin I <0.30  <0.30 ng/mL   Comment:            Due to the release kinetics of cTnI,     a negative result within the first hours     of the onset of symptoms does not rule out     myocardial infarction with certainty.     If myocardial infarction is still suspected,     repeat the test at appropriate intervals.  TROPONIN I     Status: None   Collection Time    08/21/13  3:10 AM      Result Value Ref Range  Troponin I <0.30  <0.30 ng/mL   Comment:            Due to the release kinetics of cTnI,     a negative result within the first hours     of the onset of symptoms does not rule out     myocardial infarction with certainty.     If myocardial infarction is still suspected,     repeat the test at appropriate intervals.  TROPONIN I     Status: None   Collection Time    08/21/13  9:35 AM      Result Value Ref Range   Troponin I <0.30  <0.30 ng/mL   Comment:            Due to the release kinetics of cTnI,     a negative result within the first hours     of the onset of symptoms does not rule out     myocardial infarction with certainty.     If myocardial infarction is still suspected,     repeat the test at appropriate intervals.   Dg Chest Portable 1 View  08/20/2013   CLINICAL DATA:  Near syncope  EXAM: PORTABLE CHEST - 1 VIEW  COMPARISON:  08/20/2013  FINDINGS: Chronic cardiomegaly. Status post CABG with stenting, likely of a venous graft. Negative upper mediastinal contours. There is no edema, consolidation, effusion, or pneumothorax.  IMPRESSION: No active disease.   Electronically Signed   By: Jorje Guild M.D.   On: 08/20/2013 20:59    Labs:   Lab Results   Component Value Date   WBC 7.8 08/20/2013   HGB 14.1 08/20/2013   HCT 40.5 08/20/2013   MCV 90.0 08/20/2013   PLT 221 08/20/2013    Recent Labs Lab 08/20/13 2000  NA 137  K 5.1  CL 97  CO2 21  BUN 20  CREATININE 1.12*  CALCIUM 10.2  GLUCOSE 116*   Lab Results  Component Value Date   TROPONINI <0.30 08/21/2013    Lipid Panel     Component Value Date/Time   CHOL 181 11/10/2012 1345   TRIG 247* 11/10/2012 1345   HDL 50 11/10/2012 1345   CHOLHDL 3.6 11/10/2012 1345   VLDL 49* 11/10/2012 1345   LDLCALC 82 11/10/2012 1345    EKG: EKG 716/2014: Atrial fibrillation with rapid ventricular response, T. inversion in 1 and aVL, borderline voltage criteria for LVH. Compared to 02/11/2012, atrial fibrillation is new. T. inversion unchanged. EKG 08/21/2012: Normal sinus rhythm, LVH, borderline criteria. T. inversion in lateral leads, 1 and aVL suggest LVH with repolarization abnormality versus ischemia, unchanged from prior EKG.    Assessment/Plan 1. Near syncope, suspect this may be related to either atrial fibrillation or sick sinus syndrome. 2. Paroxysmal atrial fibrillation, patient back in sinus rhythm this morning. CHA2DS2-VASCScore: Risk Score  5,  Yearly risk of stroke  6.7%/year. Recommendation: ASA No/Anticoagulation Yes [place] Has Bled: Score 2. Estimated bleeding risk: 1.8-3.2% per year.  3. CAD in native artery.  CAD/ASHD: CABG in 1994. Stents in 2002. H/O Redo CABG 06/05/01 consisting of a patent LIMA to the LAD, with free RIMA to RI, SVG to OM1, SVG to PL A, SVG to ramus intermediate branch Dr. [name]. Lexiscan stress 12/15/10: Inferior, septal and distal anterior and apical scar. No ischemia. EF 47%. 4. Aortic stenosis  Echo- 01/22/13 1. Left ventricular cavity is normal in size. Mild to moderate concentric hypertrophy. Mild global hypokinesis. Mildly decreased systolic global function.  Calculated EF 37%. Visually estimated EF is approx. 45 %. 2. Left atrial cavity is  moderately dilated. 3. Moderate aortic stenosis. Mild aortic valve leaflet calcification. Aortic valve leaflet mobility is moderately restricted. Compared to echo. of 12/30/12, no significant change in severity of AS. Peak & mean gradients are less, which appears to be due to technical reasons, there is no significant change in valve area. 4. Moderate calcification of the mitral annulus. Mild to moderate mitral regurgitation. Mitral valve inflow A > E ratio. 5. Tricuspid valve structurally normal. Mild tricuspid regurgitation. Mild pulmonary hypertension with approx. PA syst. pressure of 40 mm of Hg. 6. c.f. echo. of 12/31/11, mild Pul. HTN is new, no other diagnostic change.  5. Hyperlipidemia, mixed. 6. Chronic renal insufficiency, stage III chronic kidney disease, eGFR 53 mL. 7. Left-sided headache, no visual disturbances, no neurologic deficits, no history of fall. Started with intraocular injections for macular degeneration. I Cannot suspect intracranial abnormality. 8. Bilateral carotid artery bruit Carotid duplex 02/28/12: No evidence of hemodynamically significant stenosis in the bilateral carotid bifurcation vessels. There is evidence of homogeneous plaque in the bilateral carotid artery. Left ICA demonstrates mild tortuosity.  Recommendation: Patient has new onset of atrial fibrillation, patient has significant risk factor for recurrent atrial fibrillation due to underlying comorbidities and also is at risk for sick sinus syndrome with syncope and potentially could have bradycardia-induced syncope. She needs to be on long-term anticoagulants, I will discontinue clopidogrel. Start the patient on a liquid 5 mg by mouth twice a day. Interestingly patient has had splenic infarct in 2012. Although she is moderate to moderately severe aortic stenosis, I suspect lupus would probably be a better choice to achieve sustained anticoagulation.  She'll need event monitor for 30 days, can  potentially be discharged home today with outpatient followup. Her headache does not appear to be related to intracranial abnormality, no focal neurologic deficits, no visual disturbances, started with intraocular injection for macular degeneration.   I have discussed with her daughter regarding the bleeding risks and benefits of being on long-term anticoagulants. I have also recommended that patient will need further evaluation for sick sinus syndrome. She will also need outpatient echocardiogram.  Laverda Page, MD 08/21/2013, 11:05 AM [facility] [place] Pager: [phone] Office: [phone] If no answer: Cell:  [phone]

## 2013-08-21 NOTE — Discharge Instructions (Addendum)
Atrial Fibrillation Atrial fibrillation is a condition that causes your heart to beat irregularly. It may also cause your heart to beat faster than normal. Atrial fibrillation can prevent your heart from pumping blood normally. It increases your risk of stroke and heart problems. HOME CARE  Take medications as told by your doctor.  Only take medications that your doctor says are safe. Some medications can make the condition worse or happen again.  If blood thinners were prescribed by your doctor, take them exactly as told. Too much can cause bleeding. Too little and you will not have the needed protection against stroke and other problems.  Perform blood tests at home if told by your doctor.  Perform blood tests exactly as told by your doctor.  Do not drink alcohol.  Do not drink beverages with caffeine such as coffee, soda, and some teas.  Maintain a healthy weight.  Do not use diet pills unless your doctor says they are safe. They may make heart problems worse.  Follow diet instructions as told by your doctor.  Exercise regularly as told by your doctor.  Keep all follow-up appointments. GET HELP RIGHT AWAY IF:   You have chest or belly (abdominal) pain.  You feel sick to your stomach (nauseous)  You suddenly have swollen feet and ankles.  You feel dizzy.  You face, arms, or legs feel numb or weak.  There is a change in your vision or speech.  You notice a change in the speed, rhythm, or strength of your heartbeat.  You suddenly begin peeing (urinating) more often.  You get tired more easily when moving or exercising. MAKE SURE YOU:   Understand these instructions.  Will watch your condition.  Will get help right away if you are not doing well or get worse. Document Released: 11/01/2007 Document Revised: 05/19/2012 Document Reviewed: 03/04/2012 [facility] Patient Information 2015 [place], [place]. This information is not intended to replace advice given to you by  your health care provider. Make sure you discuss any questions you have with your health care provider.  Information on my medicine - ELIQUIS (apixaban)  This medication education was reviewed with me or my healthcare representative as part of my discharge preparation. Why was Eliquis prescribed for you? Eliquis was prescribed for you to reduce the risk of a blood clot forming that can cause a stroke if you have a medical condition called atrial fibrillation (a type of irregular heartbeat).  What do You need to know about Eliquis ? Take your Eliquis TWICE DAILY - one tablet in the morning and one tablet in the evening with or without food. If you have difficulty swallowing the tablet whole please discuss with your pharmacist how to take the medication safely.  Take Eliquis exactly as prescribed by your doctor and DO NOT stop taking Eliquis without talking to the doctor who prescribed the medication.  Stopping may increase your risk of developing a stroke.  Refill your prescription before you run out.  After discharge, you should have regular check-up appointments with your healthcare provider that is prescribing your Eliquis.  In the future your dose may need to be changed if your kidney function or weight changes by a significant amount or as you get older.  What do you do if you miss a dose? If you miss a dose, take it as soon as you remember on the same day and resume taking twice daily.  Do not take more than one dose of ELIQUIS at the same time  to make up a missed dose.  Important Safety Information A possible side effect of Eliquis is bleeding. You should call your healthcare provider right away if you experience any of the following:   Bleeding from an injury or your nose that does not stop.   Unusual colored urine (red or dark brown) or unusual colored stools (red or black).   Unusual bruising for unknown reasons.   A serious fall or if you hit your head (even if there is no  bleeding).  Some medicines may interact with Eliquis and might increase your risk of bleeding or clotting while on Eliquis. To help avoid this, consult your healthcare provider or pharmacist prior to using any new prescription or non-prescription medications, including herbals, vitamins, non-steroidal anti-inflammatory drugs (NSAIDs) and supplements.  This website has more information on Eliquis (apixaban): [email or web address].no.

## 2013-08-21 NOTE — Progress Notes (Signed)
Received from ED, alert and oriented x 4, no complaints of chest pains and shortness of breath.  Patient transported via stretcher accompanied by nurse tech.  VS taken, hooked to telemetry monitor.  Patient oriented to hospital routines and schedule.  Kept call light near pt, instructed to call for concerns.  Report received from Jenny Reichmann, [place]. Will monitor and evaluate at intervals.

## 2013-08-25 ENCOUNTER — Other Ambulatory Visit: Payer: PRIVATE HEALTH INSURANCE

## 2013-08-25 ENCOUNTER — Inpatient Hospital Stay (HOSPITAL_COMMUNITY): Payer: PRIVATE HEALTH INSURANCE

## 2013-09-14 ENCOUNTER — Telehealth: Payer: Self-pay | Admitting: Family Medicine

## 2013-09-14 NOTE — Telephone Encounter (Signed)
Appt made for patient to have urinalysis to ensure we treat sx's properly. Jodi [name],CMA

## 2013-09-14 NOTE — Telephone Encounter (Signed)
Pt called and would like the doctor to call her in some medication for as UTI. jw

## 2013-09-15 ENCOUNTER — Ambulatory Visit (INDEPENDENT_AMBULATORY_CARE_PROVIDER_SITE_OTHER): Payer: PRIVATE HEALTH INSURANCE | Admitting: Family Medicine

## 2013-09-15 VITALS — BP 154/54 | HR 57 | Temp 98.6°F | Ht 63.0 in | Wt 163.0 lb

## 2013-09-15 DIAGNOSIS — N3001 Acute cystitis with hematuria: Secondary | ICD-10-CM

## 2013-09-15 DIAGNOSIS — N390 Urinary tract infection, site not specified: Secondary | ICD-10-CM

## 2013-09-15 DIAGNOSIS — R300 Dysuria: Secondary | ICD-10-CM

## 2013-09-15 DIAGNOSIS — N3000 Acute cystitis without hematuria: Secondary | ICD-10-CM

## 2013-09-15 LAB — POCT URINALYSIS DIPSTICK
Bilirubin, UA: NEGATIVE
Glucose, UA: NEGATIVE
Ketones, UA: NEGATIVE
Nitrite, UA: NEGATIVE
Protein, UA: NEGATIVE
Spec Grav, UA: 1.01
Urobilinogen, UA: 0.2
pH, UA: 7

## 2013-09-15 LAB — POCT UA - MICROSCOPIC ONLY

## 2013-09-15 MED ORDER — CEPHALEXIN 500 MG PO CAPS: 500.0000 mg | ORAL_CAPSULE | Freq: Three times a day (TID) | ORAL | Status: DC

## 2013-09-15 NOTE — Patient Instructions (Signed)
Great to meet you!  Urinary Tract Infection Urinary tract infections (UTIs) can develop anywhere along your urinary tract. Your urinary tract is your body's drainage system for removing wastes and extra water. Your urinary tract includes two kidneys, two ureters, a bladder, and a urethra. Your kidneys are a pair of bean-shaped organs. Each kidney is about the size of your fist. They are located below your ribs, one on each side of your spine. CAUSES Infections are caused by microbes, which are microscopic organisms, including fungi, viruses, and bacteria. These organisms are so small that they can only be seen through a microscope. Bacteria are the microbes that most commonly cause UTIs. SYMPTOMS  Symptoms of UTIs may vary by age and gender of the patient and by the location of the infection. Symptoms in young women typically include a frequent and intense urge to urinate and a painful, burning feeling in the bladder or urethra during urination. Older women and men are more likely to be tired, shaky, and weak and have muscle aches and abdominal pain. A fever may mean the infection is in your kidneys. Other symptoms of a kidney infection include pain in your back or sides below the ribs, nausea, and vomiting. DIAGNOSIS To diagnose a UTI, your caregiver will ask you about your symptoms. Your caregiver also will ask to provide a urine sample. The urine sample will be tested for bacteria and white blood cells. White blood cells are made by your body to help fight infection. TREATMENT  Typically, UTIs can be treated with medication. Because most UTIs are caused by a bacterial infection, they usually can be treated with the use of antibiotics. The choice of antibiotic and length of treatment depend on your symptoms and the type of bacteria causing your infection. HOME CARE INSTRUCTIONS  If you were prescribed antibiotics, take them exactly as your caregiver instructs you. Finish the medication even if you  feel better after you have only taken some of the medication.  Drink enough water and fluids to keep your urine clear or pale yellow.  Avoid caffeine, tea, and carbonated beverages. They tend to irritate your bladder.  Empty your bladder often. Avoid holding urine for long periods of time.  Empty your bladder before and after sexual intercourse.  After a bowel movement, women should cleanse from front to back. Use each tissue only once. SEEK MEDICAL CARE IF:   You have back pain.  You develop a fever.  Your symptoms do not begin to resolve within 3 days. SEEK IMMEDIATE MEDICAL CARE IF:   You have severe back pain or lower abdominal pain.  You develop chills.  You have nausea or vomiting.  You have continued burning or discomfort with urination. MAKE SURE YOU:   Understand these instructions.  Will watch your condition.  Will get help right away if you are not doing well or get worse. Document Released: 11/01/2004 Document Revised: 07/24/2011 Document Reviewed: 03/02/2011 [facility] Patient Information 2015 [place], [place]. This information is not intended to replace advice given to you by your health care provider. Make sure you discuss any questions you have with your health care provider.

## 2013-09-15 NOTE — Assessment & Plan Note (Addendum)
Patient with dysuria, however atypical description of this UA with large leukocytes, micro with many WBCs, 5-10 RBCs per high-power field Previous UAs without significant blood No concern for pyelonephritis, exam is benign and feeling well otherwise Treat with Keflex 3 times a day x7 days Followup with PCP as previously planned

## 2013-09-15 NOTE — Progress Notes (Signed)
Patient ID: Jodi [name], female   DOB: 10-27-1932, 80 y.o.   MRN: [id number]  Kenn File, MD Phone: [phone]  Subjective:  Chief complaint-noted  Pt Here for concern for UTI  Patient states that starting Sunday, 2 days ago, she began having dysuria. When asked to describe the dysuria she states it is "electric-like pain allover her body every time she pees".  She denies fever, chills, sweats She denies abdominal and back pain She denies decreased by mouth intake She takes cranberry every day  ROS-  Per history of present illness  Past Medical History Patient Active Problem List   Diagnosis Date Noted  . UTI (urinary tract infection) 09/15/2013  . Atrial fibrillation 08/20/2013  . Wart of scalp 04/13/2013  . Renal cell carcinoma 09/23/2012  . Renal insufficiency 02/20/2012  . Aortic stenosis 12/06/2010  . SEBORRHEA 11/10/2007  . INSOMNIA 06/09/2007  . OTITIS MEDIA, CHRONIC 06/04/2006  . HYPERCHOLESTEROLEMIA 04/04/2006  . HYPERTENSION, BENIGN SYSTEMIC 04/04/2006  . CORONARY, ARTERIOSCLEROSIS 04/04/2006  . CHF - EJECTION FRACTION < 50% 04/04/2006  . OSTEOARTHRITIS, MULTI SITES 04/04/2006    Medications- reviewed and updated Current Outpatient Prescriptions  Medication Sig Dispense Refill  . amLODipine (NORVASC) 10 MG tablet Take 10 mg by mouth daily.      Marland Kitchen apixaban (ELIQUIS) 5 MG TABS tablet Take 1 tablet (5 mg total) by mouth 2 (two) times daily.  60 tablet  1  . beta carotene w/minerals (OCUVITE) tablet Take 1 tablet by mouth daily.      Marland Kitchen BIOTIN PO Take 1 tablet by mouth daily.      . carvedilol (COREG) 6.25 MG tablet Take 6.25 mg by mouth 2 (two) times daily with a meal.      . cephALEXin (KEFLEX) 500 MG capsule Take 1 capsule (500 mg total) by mouth 3 (three) times daily.  21 capsule  0  . cholecalciferol (VITAMIN D) 1000 UNITS tablet Take 2,000 Units by mouth daily.       Marland Kitchen CRANBERRY PO Take 1 tablet by mouth daily.      . Multiple Vitamin  (MULTIVITAMIN) capsule Take 1 capsule by mouth daily.        . ramipril (ALTACE) 2.5 MG capsule Take 2.5 mg by mouth daily.      . rosuvastatin (CRESTOR) 20 MG tablet Take 20 mg by mouth daily.      Marland Kitchen spironolactone (ALDACTONE) 25 MG tablet Take 25 mg by mouth daily.       No current facility-administered medications for this visit.    Objective: BP 154/54  Pulse 57  Temp(Src) 98.6 F (37 C) (Oral)  Ht 5\' 3"  (1.6 m)  Wt 163 lb (73.936 kg)  BMI 28.88 kg/m2 Gen: NAD, alert, cooperative with exam HEENT: NCAT CV: RRR, good S1/S2, no murmur Resp: CTABL, no wheezes, non-labored Abd: SNTND, BS present, no guarding or organomegaly, no suprapubic tenderness, no CVA tenderness Neuro: Alert and oriented, No gross deficits   Assessment/Plan:  UTI (urinary tract infection) Patient with dysuria, however atypical description of this UA with large leukocytes, micro with many WBCs, 5-10 RBCs per high-power field Previous UAs without significant blood No concern for pyelonephritis, exam is benign and feeling well otherwise Treat with Keflex 3 times a day x7 days Followup with PCP as previously planned     Orders Placed This Encounter  Procedures  . Urine culture  . POCT urinalysis dipstick  . POCT UA - Microscopic Only    Meds ordered this encounter  Medications  . cephALEXin (KEFLEX) 500 MG capsule    Sig: Take 1 capsule (500 mg total) by mouth 3 (three) times daily.    Dispense:  21 capsule    Refill:  0

## 2013-09-21 ENCOUNTER — Other Ambulatory Visit: Payer: Self-pay | Admitting: Family Medicine

## 2013-09-21 ENCOUNTER — Telehealth: Payer: Self-pay | Admitting: Family Medicine

## 2013-09-21 NOTE — Telephone Encounter (Signed)
Pt states that insurance company requires a PA for [place]. They will only allow her 58 tablets/ yearly. Pls advise.

## 2013-09-21 NOTE — Telephone Encounter (Signed)
Pt called back and also needs a prescription for hydrocodone left up front. jw

## 2013-09-21 NOTE — Telephone Encounter (Signed)
Will forward message to PCP.  I have not received any prior authorization request for Ambien.  Derl Barrow, RN

## 2013-09-22 NOTE — Telephone Encounter (Signed)
She has not had rx for hydrocodone for >6 mo would need an office visit  We have to do what her insurance covers unless she wants to pay out of pocket for [employer]

## 2013-09-22 NOTE — Telephone Encounter (Signed)
LMOVM for pt to return call .Myna Freimark Dawn  

## 2013-09-25 ENCOUNTER — Ambulatory Visit (INDEPENDENT_AMBULATORY_CARE_PROVIDER_SITE_OTHER): Payer: PRIVATE HEALTH INSURANCE | Admitting: Family Medicine

## 2013-09-25 ENCOUNTER — Other Ambulatory Visit: Payer: Self-pay | Admitting: *Deleted

## 2013-09-25 ENCOUNTER — Encounter: Payer: Self-pay | Admitting: Family Medicine

## 2013-09-25 VITALS — BP 147/76 | HR 56 | Temp 98.1°F | Wt 161.0 lb

## 2013-09-25 DIAGNOSIS — R51 Headache: Secondary | ICD-10-CM

## 2013-09-25 DIAGNOSIS — R519 Headache, unspecified: Secondary | ICD-10-CM | POA: Insufficient documentation

## 2013-09-25 DIAGNOSIS — G4700 Insomnia, unspecified: Secondary | ICD-10-CM

## 2013-09-25 MED ORDER — ZOLPIDEM TARTRATE 5 MG PO TABS: 5.0000 mg | ORAL_TABLET | Freq: Every evening | ORAL | Status: DC | PRN

## 2013-09-25 MED ORDER — ESTAZOLAM 1 MG PO TABS: 1.0000 mg | ORAL_TABLET | Freq: Every day | ORAL | Status: DC

## 2013-09-25 NOTE — Assessment & Plan Note (Addendum)
Currently asymptomatic. No signs of meningeal irritation, no neurologic deficit. HA may be related to vision problem vs treatment. I recommended f/u with ophthalmologist for reassessment. She has follow up appointment with Dr Herbert Deaner Sept 24th, I recommended calling for an earlier appointment and she agreed with plan. Tylenol prn HA recommended. Return precaution given. Patient verbalized understanding and agreed with plan.

## 2013-09-25 NOTE — Progress Notes (Addendum)
Subjective:     Patient ID: Jodi [name], female   DOB: 10/02/1932, 80 y.o.   MRN: [id number]  HPI Headache: C/O HA around her left eye since she started getting shots in left eye for macular degeneration by Dr Zadie Rhine,  she quite getting shots 5-6wks ago but HA had improved only a little since then. HA continued daily for the last 2 wks , mostly unilateral around left eye, no N/V,no vision loss. No radiation of her HA.HA is described as pounding. HA does not wake her up at night. No limb weakness. Uses Ibuprofen prn pain. Currently asymptomatic but whenever she has the HA it is about 8-10/10 in severity. HA improved with use of Tylenol or Ibuprofen. Denies any head injury, no gait abnormality. Insomnia:C/O sleep difficulty for about a year for which she was doing well on Ambien, she ran out of medication about a year ago and was informed insurance will only cover few times per year. She has been using Tylenol PM with some improvement. She will like to get refill for her Ambien to see if insurance will cover it this time.  Current Outpatient Prescriptions on File Prior to Visit  Medication Sig Dispense Refill  . amLODipine (NORVASC) 10 MG tablet Take 10 mg by mouth daily.      Marland Kitchen apixaban (ELIQUIS) 5 MG TABS tablet Take 1 tablet (5 mg total) by mouth 2 (two) times daily.  60 tablet  1  . beta carotene w/minerals (OCUVITE) tablet Take 1 tablet by mouth daily.      Marland Kitchen BIOTIN PO Take 1 tablet by mouth daily.      . carvedilol (COREG) 6.25 MG tablet Take 6.25 mg by mouth 2 (two) times daily with a meal.      . cholecalciferol (VITAMIN D) 1000 UNITS tablet Take 2,000 Units by mouth daily.       Marland Kitchen CRANBERRY PO Take 1 tablet by mouth daily.      . Multiple Vitamin (MULTIVITAMIN) capsule Take 1 capsule by mouth daily.        . ramipril (ALTACE) 2.5 MG capsule Take 2.5 mg by mouth daily.      . rosuvastatin (CRESTOR) 20 MG tablet Take 20 mg by mouth daily.      Marland Kitchen spironolactone (ALDACTONE) 25 MG tablet  Take 25 mg by mouth daily.       No current facility-administered medications on file prior to visit.   Past Medical History  Diagnosis Date  . Myocardial infarction   . Coronary artery disease   . Hypertension   . Heart murmur   . Chronic kidney disease   . Aortic stenosis   . CHF (congestive heart failure)   . Hypercholesteremia   . Shortness of breath   . Stroke   . Renal mass 12/06/2010    CT Abdomen 11-27-10 upper pole region of the right kidney which is suspicious for solid lesion, measuring 1.9 x 1.3 cm. This lesion is concerning for renal cell carcinoma given the solid appearance.  Following with Dr [name].  Renal bx was benign    . Splenic infarct 12/06/2010  . Renal cell carcinoma 09/23/2012      Review of Systems  Eyes: Negative for visual disturbance.  Respiratory: Negative.   Cardiovascular: Negative.   Gastrointestinal: Negative.   Neurological: Positive for headaches. Negative for dizziness, tremors, seizures, syncope, facial asymmetry, speech difficulty, weakness, light-headedness and numbness.  Psychiatric/Behavioral: Positive for sleep disturbance.  All other systems reviewed and are negative.  Filed Vitals:   09/25/13 0839  BP: 147/76  Pulse: 56  Temp: 98.1 F (36.7 C)  TempSrc: Oral  Weight: 161 lb (73.029 kg)       Objective:   Physical Exam  Nursing note and vitals reviewed. Constitutional: She is oriented to person, place, and time. She appears well-developed. No distress.  Eyes: Conjunctivae and EOM are normal. Pupils are equal, round, and reactive to light.  No facial tenderness.  Cardiovascular: Normal rate, regular rhythm and intact distal pulses.   Murmur heard. Pulmonary/Chest: Effort normal and breath sounds normal. No respiratory distress. She has no wheezes.  Abdominal: Soft. Bowel sounds are normal. She exhibits no distension and no mass. There is no tenderness.  Musculoskeletal: Normal range of motion.  Neurological: She is alert  and oriented to person, place, and time. She has normal strength and normal reflexes. No cranial nerve deficit or sensory deficit. She displays a negative Romberg sign. Coordination normal. She displays no Babinski's sign on the right side. She displays no Babinski's sign on the left side.  No neurologic deficit or signs of meningeal irritation.       Assessment:     Headache Insomnia     Plan:     Check problem list.

## 2013-09-25 NOTE — Patient Instructions (Signed)
It was nice seeing you today, I am sorry you don't feel well. I will recommend follow up with your ophthalmologist to reassess your vision which could be a cause of your headache. If HA continues please follow up with [place] for CT head, I don't feel the need for that now. I refilled your Ambien for sleep,please don't take medication with driving.

## 2013-09-25 NOTE — Assessment & Plan Note (Signed)
She had done well on Ambien. I refilled her medication. Will PA if needed or switch to another regimen covered by her insurance.

## 2013-09-28 NOTE — Telephone Encounter (Signed)
Please call in rx for ambien thanks

## 2013-10-07 ENCOUNTER — Encounter (HOSPITAL_COMMUNITY): Payer: Self-pay | Admitting: Pharmacy Technician

## 2013-10-20 ENCOUNTER — Encounter (HOSPITAL_COMMUNITY): Payer: PRIVATE HEALTH INSURANCE | Admitting: Anesthesiology

## 2013-10-20 ENCOUNTER — Ambulatory Visit (HOSPITAL_COMMUNITY)
Admission: RE | Admit: 2013-10-20 | Discharge: 2013-10-20 | Disposition: A | Discharge status: Home / Self Care | Payer: PRIVATE HEALTH INSURANCE | Source: Ambulatory Visit | Attending: Cardiology | Admitting: Cardiology

## 2013-10-20 ENCOUNTER — Encounter (HOSPITAL_COMMUNITY)
Admission: RE | Disposition: A | Discharge status: Home / Self Care | Payer: Self-pay | Source: Ambulatory Visit | Attending: Cardiology

## 2013-10-20 ENCOUNTER — Ambulatory Visit (HOSPITAL_COMMUNITY): Payer: PRIVATE HEALTH INSURANCE | Admitting: Anesthesiology

## 2013-10-20 ENCOUNTER — Encounter (HOSPITAL_COMMUNITY): Payer: Self-pay | Admitting: Gastroenterology

## 2013-10-20 DIAGNOSIS — Z951 Presence of aortocoronary bypass graft: Secondary | ICD-10-CM | POA: Insufficient documentation

## 2013-10-20 DIAGNOSIS — I5032 Chronic diastolic (congestive) heart failure: Secondary | ICD-10-CM | POA: Diagnosis not present

## 2013-10-20 DIAGNOSIS — I359 Nonrheumatic aortic valve disorder, unspecified: Secondary | ICD-10-CM | POA: Insufficient documentation

## 2013-10-20 DIAGNOSIS — I509 Heart failure, unspecified: Secondary | ICD-10-CM | POA: Insufficient documentation

## 2013-10-20 DIAGNOSIS — I2510 Atherosclerotic heart disease of native coronary artery without angina pectoris: Secondary | ICD-10-CM | POA: Insufficient documentation

## 2013-10-20 DIAGNOSIS — Z90710 Acquired absence of both cervix and uterus: Secondary | ICD-10-CM | POA: Insufficient documentation

## 2013-10-20 DIAGNOSIS — I4891 Unspecified atrial fibrillation: Secondary | ICD-10-CM | POA: Diagnosis not present

## 2013-10-20 DIAGNOSIS — N183 Chronic kidney disease, stage 3 unspecified: Secondary | ICD-10-CM | POA: Diagnosis not present

## 2013-10-20 DIAGNOSIS — I129 Hypertensive chronic kidney disease with stage 1 through stage 4 chronic kidney disease, or unspecified chronic kidney disease: Secondary | ICD-10-CM | POA: Insufficient documentation

## 2013-10-20 HISTORY — PX: CARDIOVERSION: SHX1299

## 2013-10-20 LAB — POCT I-STAT, CHEM 8
BUN: 30 mg/dL — ABNORMAL HIGH (ref 6–23)
Calcium, Ion: 1.19 mmol/L (ref 1.13–1.30)
Chloride: 105 mEq/L (ref 96–112)
Creatinine, Ser: 1.5 mg/dL — ABNORMAL HIGH (ref 0.50–1.10)
Glucose, Bld: 115 mg/dL — ABNORMAL HIGH (ref 70–99)
HCT: 40 % (ref 36.0–46.0)
Hemoglobin: 13.6 g/dL (ref 12.0–15.0)
Potassium: 4.6 mEq/L (ref 3.7–5.3)
Sodium: 140 mEq/L (ref 137–147)
TCO2: 22 mmol/L (ref 0–100)

## 2013-10-20 SURGERY — CARDIOVERSION
Anesthesia: Monitor Anesthesia Care

## 2013-10-20 MED ORDER — LIDOCAINE HCL (CARDIAC) 20 MG/ML IV SOLN
INTRAVENOUS | Status: DC | PRN
  Administered 2013-10-20: 50 mg via INTRAVENOUS

## 2013-10-20 MED ORDER — SODIUM CHLORIDE 0.9 % IV SOLN
INTRAVENOUS | Status: DC
  Administered 2013-10-20: 10:00:00 via INTRAVENOUS
  Administered 2013-10-20: 500 mL via INTRAVENOUS

## 2013-10-20 MED ORDER — PROPOFOL 10 MG/ML IV BOLUS
INTRAVENOUS | Status: DC | PRN
  Administered 2013-10-20: 70 mg via INTRAVENOUS

## 2013-10-20 NOTE — CV Procedure (Signed)
Direct current cardioversion:  Indication symptomatic A. Fibrillation.  Procedure: Using 70 mg of IV Propofol and 50 IV Lidocaine (for reducing venous pain) for achieving deep sedation, synchronized direct current cardioversion performed. Patient was delivered with 100 Joules of electricity X 1 with success to NSR. Patient tolerated the procedure well. No immediate complication noted.

## 2013-10-20 NOTE — Anesthesia Procedure Notes (Signed)
Procedure Name: MAC Date/Time: 10/13/2013 10:13 AM Performed by: Eligha Bridegroom Pre-anesthesia Checklist: Emergency Drugs available, Timeout performed, Patient identified, Suction available and Patient being monitored Patient Re-evaluated:Patient Re-evaluated prior to inductionOxygen Delivery Method: Ambu bag Preoxygenation: Pre-oxygenation with 100% oxygen Intubation Type: IV induction Ventilation: Mask ventilation without difficulty

## 2013-10-20 NOTE — H&P (Signed)
  Please see office visit notes for complete details of HPI.  

## 2013-10-20 NOTE — Interval H&P Note (Signed)
History and Physical Interval Note:  10/20/2013 9:57 AM  Jodi [name]  has presented today for surgery, with the diagnosis of AFIB  The various methods of treatment have been discussed with the patient and family. After consideration of risks, benefits and other options for treatment, the patient has consented to  Procedure(s) with comments: CARDIOVERSION (N/A) - H&P in file as a surgical intervention .  The patient's history has been reviewed, patient examined, no change in status, stable for surgery.  I have reviewed the patient's chart and labs.  Questions were answered to the patient's satisfaction.     Laverda Page

## 2013-10-20 NOTE — Transfer of Care (Signed)
Immediate Anesthesia Transfer of Care Note  Patient: Jodi [name]  Procedure(s) Performed: Procedure(s) with comments: CARDIOVERSION (N/A) - H&P in file  Patient Location: PACU and Endoscopy Unit  Anesthesia Type:MAC  Level of Consciousness: awake and alert   Airway & Oxygen Therapy: Patient Spontanous Breathing and Patient connected to nasal cannula oxygen  Post-op Assessment: Report given to PACU RN and Post -op Vital signs reviewed and stable  Post vital signs: Reviewed and stable  Complications: No apparent anesthesia complications

## 2013-10-20 NOTE — Anesthesia Preprocedure Evaluation (Addendum)
Anesthesia Evaluation    Airway       Dental   Pulmonary          Cardiovascular hypertension,     Neuro/Psych    GI/Hepatic   Endo/Other    Renal/GU      Musculoskeletal   Abdominal   Peds  Hematology   Anesthesia Other Findings   Reproductive/Obstetrics                          Anesthesia Physical Anesthesia Plan  ASA: III  Anesthesia Plan:    Post-op Pain Management:    Induction:   Airway Management Planned:   Additional Equipment:   Intra-op Plan:   Post-operative Plan:   Informed Consent:   Plan Discussed with:   Anesthesia Plan Comments:         Anesthesia Quick Evaluation

## 2013-10-21 ENCOUNTER — Encounter (HOSPITAL_COMMUNITY): Payer: Self-pay | Admitting: Cardiology

## 2013-10-21 ENCOUNTER — Ambulatory Visit (INDEPENDENT_AMBULATORY_CARE_PROVIDER_SITE_OTHER): Payer: PRIVATE HEALTH INSURANCE | Admitting: Cardiovascular Disease

## 2013-10-21 VITALS — BP 150/78 | HR 65 | Ht 63.0 in | Wt 163.0 lb

## 2013-10-21 DIAGNOSIS — I5022 Chronic systolic (congestive) heart failure: Secondary | ICD-10-CM

## 2013-10-21 DIAGNOSIS — I359 Nonrheumatic aortic valve disorder, unspecified: Secondary | ICD-10-CM

## 2013-10-22 ENCOUNTER — Encounter: Payer: Self-pay | Admitting: Cardiovascular Disease

## 2013-10-22 NOTE — Progress Notes (Signed)
HPI:   80 year-old woman referred by Dr Einar Gip for consideration of TAVR. The patient has a complex medical and cardiac history, including 2 previous coronary bypass surgeries. Her most recent CABG surgery in [id number] was complicated by post-operative bleeding, re-exploration, tamponade, and sternal reopening. After a prolonged hospitalization, she recovered well and has lived independently now for many years. She was followed by Dr Melvern Banker previously and now has been cared for by Dr Einar Gip for many years.  The patient has done remarkably well over the past several years until the last 3-4 months when she has developed progressive problems with exertional dyspnea, fatigue, and generalized weakness. She was previously able to walk one mile on a regular basis without stopping to rest. More recently, she is unable to complete her household chores and cannot make her bed without resting several times. She denies substernal chest pain or pressure. She denies orthopnea, PND, or leg swelling. She does admit to frequent lightheadedness. She was diagnosed with atrial fibrillation with RVR in July after presenting with near-syncope. She has been rate-controlled and anticoagulated since that time, and she underwent DCCV yesterday. She is already feeling a little better since cardioversion and describes an improved energy level.   The patient's most recent echo study dated 09/03/2013 shows moderate concentric LVH with LVEF 40-45%, severe LAE, severe AS with mean and peak gradients of 23 and 39 mmHg but severe leaflet thickening and restriction, and moderate-severe MR, mild-moderate TR.  The patient has been widowed for many years. She is here with her daughter today. She lives independently and has been relatively active until the past few months. She denies any bleeding problems since starting apixaban 2 months ago. She was hospitalized in 2014 with acute on chronic heart failure and acute kidney injury. She is not limited  by arthritis or other noncardiac issues.   Outpatient Encounter Prescriptions as of 10/21/2013  Medication Sig  . amLODipine (NORVASC) 10 MG tablet Take 10 mg by mouth daily.  Marland Kitchen apixaban (ELIQUIS) 5 MG TABS tablet Take 1 tablet (5 mg total) by mouth 2 (two) times daily.  . beta carotene w/minerals (OCUVITE) tablet Take 1 tablet by mouth daily.  . Biotin 5000 MCG TABS Take 5,000 mcg by mouth 2 (two) times daily.  . carvedilol (COREG) 6.25 MG tablet Take 6.25 mg by mouth 2 (two) times daily with a meal.  . cholecalciferol (VITAMIN D) 1000 UNITS tablet Take 2,000 Units by mouth daily.   . Cranberry 12600 MG CAPS Take 12,600 mg by mouth daily.  Marland Kitchen ibuprofen (ADVIL,MOTRIN) 200 MG tablet Take 600 mg by mouth every 6 (six) hours as needed for moderate pain.  . Liniments (SALONPAS EX) Place 1 patch onto the skin daily as needed (for pain).  . Multiple Vitamin (MULTIVITAMIN) capsule Take 1 capsule by mouth daily.    . ramipril (ALTACE) 2.5 MG capsule Take 2.5 mg by mouth daily.  . rosuvastatin (CRESTOR) 20 MG tablet Take 20 mg by mouth daily.  Marland Kitchen spironolactone (ALDACTONE) 25 MG tablet Take 25 mg by mouth daily.  Marland Kitchen zolpidem (AMBIEN) 5 MG tablet Take 1 tablet (5 mg total) by mouth at bedtime as needed for sleep.    Sulfamethoxazole-trimethoprim  Past Medical History  Diagnosis Date  . Myocardial infarction   . Coronary artery disease   . Hypertension   . Heart murmur   . Chronic kidney disease   . Aortic stenosis   . CHF (congestive heart failure)   . Hypercholesteremia   .  Shortness of breath   . Stroke   . Renal mass 12/06/2010    CT Abdomen 11-27-10 upper pole region of the right kidney which is suspicious for solid lesion, measuring 1.9 x 1.3 cm. This lesion is concerning for renal cell carcinoma given the solid appearance.  Following with Dr [name].  Renal bx was benign    . Splenic infarct 12/06/2010  . Renal cell carcinoma 09/23/2012    Past Surgical History  Procedure Laterality  Date  . Radical hysterectomy    . Coronary artery bypass graft  1995 and 2003  . Abdominal hysterectomy    . Eye surgery    . Cardiac catheterization    . Cardioversion N/A 10/20/2013    Procedure: CARDIOVERSION;  Surgeon: Laverda Page, MD;  Location: [facility];  Service: Cardiovascular;  Laterality: N/A;  H&P in file    History   Social History  . Marital Status: Widowed    Spouse Name: N/A    Number of Children: 8  . Years of Education: 10   Occupational History  . retired-mill worker    Social History Main Topics  . Smoking status: Never Smoker   . Smokeless tobacco: Never Used  . Alcohol Use: No  . Drug Use: No  . Sexual Activity: Not Currently   Other Topics Concern  . Not on file   Social History Narrative   Health Care POA:    Emergency Contact: son Legrand Como   End of Life Plan: reports done, encourage to bring [place] a copy   Who lives with you: self- retirement community   Any pets: none   Diet: Pt has varied diet of protein, starch, vegetables   Exercise: Pt reports walking almost every day   Seatbelts: Pt reports wearing seatbelt when in vehicles.    Sun Exposure/Protection: sunglasses   Hobbies: church, walking, visiting friends                Family History  Problem Relation Age of Onset  . Coronary artery disease Mother   . Coronary artery disease Brother   . Stroke Mother   . Heart attack Brother   . Heart attack Son   . Heart attack Daughter   . Heart attack Sister     ROS:  General: no fevers/chills/night sweats, positive for generalized weakness Eyes: positive for macular degeneration, vision loss ENT: no sore throat or hearing loss Resp: no cough, wheezing, or hemoptysis CV: no edema or palpitations, otherwise see HPI GI: no abdominal pain, nausea, vomiting, diarrhea, or constipation GU: no dysuria, frequency, or hematuria Skin: no rash Neuro: positive for headaches, negative for numbness, tingling, or weakness of  extremities Musculoskeletal: no joint pain or swelling Heme: no bleeding, DVT, or easy bruising Endo: no polydipsia or polyuria  BP 150/78  Pulse 65  Ht 5\' 3"  (1.6 m)  Wt 163 lb (73.936 kg)  BMI 28.88 kg/m2  SpO2 97%  PHYSICAL EXAM: Pt is alert and oriented, WD, WN, pleasant elderly woman in no distress. HEENT: normal Neck: JVP normal. Carotid upstrokes normal with bilateral bruits. No thyromegaly. Lungs: equal expansion, clear bilaterally CV: Apex is discrete and nondisplaced, RRR with grade 3/6 harsh mid-peaking systolic murmur at the RUSB, preserved A2 closure component Abd: soft, NT, +BS, no bruit, no hepatosplenomegaly Back: no CVA tenderness Ext: no C/C/E      DP/PT pulses intact and = Skin: warm and dry without rash Neuro: CNII-XII intact  Strength intact = bilaterally  RISK SCORES About the STS Risk Calculator  Procedure: AV Replacement  Risk of Mortality: 5.838%  Morbidity or Mortality: 35.357%  Long Length of Stay: 15.523%  Short Length of Stay: 13.031%  Permanent Stroke: 4.011%  Prolonged Ventilation: 29.024%  DSW Infection: 0.198%  Renal Failure: 7.916%  Reoperation: 9.773%  ASSESSMENT AND PLAN: 80 year-old woman with NYHA Class 3 CHF and aortic stenosis. I have personally reviewed her echo images with Dr Einar Gip. While her valve gradients are lower than expected for severe aortic stenosis, the aortic valve is severely thickened and restricted. Her progressive and now functional class 3 symptoms are certainly concerning for severe AS. In addition, she was hospitalized last year with decompensated heart failure on one occasion. On my review of her echo, I do not think she has more than moderate MR or TR. Therefore if her aortic stenosis is confirmed to be in the severe range, she would likely be a candidate for treatment.   Natural history, prognosis, and treatment options for severe aortic stenosis were reviewed with the patient and her  daughter today at length. Palliative medical therapy, conventional surgical AVR, and TAVR were compared in the context of the patient's specific medical problems. While her STS score places her in a moderate-risk category, I think her actual risk of conventional surgery would be much higher than predicted considering her 2 previous cardiac surgeries and extremely complicated postoperative course in the past.   We discussed next steps in her evaluation today. Right and left heart cath will be important to clearly document the severity of aortic stenosis and provide further information on her mitral valve disease, cardiac hemodynamics, and graft anatomy. Since she was just cardioverted yesterday, it may be prudent to wait 30 days to perform her cath so that anticoagulation does not have to be interrupted. Pending the results of her cath, further evaluation might include CT studies and formal cardiac surgery evaluation by Dr Roxy Manns. Will await cath results before determining further follow-up. Case discussed at length with Dr Einar Gip who will arrange the patient's heart catheterization next month.  Sherren Mocha 10/22/2013 11:59 AM

## 2013-10-22 NOTE — Addendum Note (Signed)
Addendum created 10/22/13 1239 by Finis Bud, MD   Modules edited: Clinical Notes   Clinical Notes:  File: [id number]

## 2013-10-22 NOTE — Anesthesia Postprocedure Evaluation (Signed)
  Anesthesia Post-op Note  Patient: Jodi [name]  Procedure(s) Performed: Procedure(s) with comments: CARDIOVERSION (N/A) - H&P in file  Patient Location: PACU and Endoscopy Unit  Anesthesia Type:MAC  Level of Consciousness: awake  Airway and Oxygen Therapy: Patient Spontanous Breathing  Post-op Pain: mild  Post-op Assessment: Post-op Vital signs reviewed  Post-op Vital Signs: Reviewed  Last Vitals:  Filed Vitals:   10/20/13 1050  BP: 150/90  Pulse: 64  Temp:   Resp: 17    Complications: No apparent anesthesia complications

## 2013-10-30 ENCOUNTER — Telehealth: Payer: Self-pay | Admitting: Family Medicine

## 2013-10-30 ENCOUNTER — Telehealth: Payer: Self-pay | Admitting: *Deleted

## 2013-10-30 NOTE — Telephone Encounter (Signed)
Pt might need to come in and be evaluated.  Will forward to MD. Jodi [name]

## 2013-10-30 NOTE — Telephone Encounter (Signed)
Pt called stating she called this morning thinking she might have a UTI and she didn't want to come into clinic just wanted antibiotics called in to her pharmacy.  Pt stated now she wants to be seen because when she wiped after using the batthroom; she saw blood.  Pt informed that there was no appts at [facility] this afternoon and she would need to go urgent care for evaluation.  Precepted with Dr. Ree Kida; agree that patient will need to be seen at urgent care if no appt are available at [facility].  Derl Barrow, RN

## 2013-10-30 NOTE — Telephone Encounter (Signed)
Yes she needs to come in - Will need to get Urine Culture before antibiotics   Thanks  Lc

## 2013-10-30 NOTE — Telephone Encounter (Signed)
Informed patient that she would need an appt but she insisted on only see PCP.  Next available appt is 11-23-13.  Advised patient that we can get her in to see someone on Monday for this acute problem.  Pt states that she will just try OTC AZO and see if this helps.  Will call and let us know if she needs to see someone.  Kharter Brew,CMA

## 2013-10-30 NOTE — Telephone Encounter (Signed)
Pt called and would like Dr. Erin Hearing to call in something for a UTI. Please call to [facility] on [address] jw

## 2013-11-02 ENCOUNTER — Ambulatory Visit (INDEPENDENT_AMBULATORY_CARE_PROVIDER_SITE_OTHER): Payer: PRIVATE HEALTH INSURANCE | Admitting: Family Medicine

## 2013-11-02 ENCOUNTER — Encounter: Payer: Self-pay | Admitting: Family Medicine

## 2013-11-02 ENCOUNTER — Other Ambulatory Visit: Payer: Self-pay | Admitting: Family Medicine

## 2013-11-02 VITALS — BP 153/57 | HR 63 | Temp 98.1°F | Ht 63.5 in | Wt 161.3 lb

## 2013-11-02 DIAGNOSIS — R300 Dysuria: Secondary | ICD-10-CM

## 2013-11-02 DIAGNOSIS — N3000 Acute cystitis without hematuria: Secondary | ICD-10-CM

## 2013-11-02 LAB — POCT UA - MICROSCOPIC ONLY

## 2013-11-02 LAB — POCT URINALYSIS DIPSTICK
Bilirubin, UA: NEGATIVE
Glucose, UA: NEGATIVE
Ketones, UA: NEGATIVE
Nitrite, UA: NEGATIVE
Protein, UA: NEGATIVE
Spec Grav, UA: <=1.005
Urobilinogen, UA: 0.2
pH, UA: 6.5

## 2013-11-02 MED ORDER — HYDROCODONE-ACETAMINOPHEN 10-325 MG PO TABS: 1.0000 | ORAL_TABLET | Freq: Three times a day (TID) | ORAL | Status: DC | PRN

## 2013-11-02 MED ORDER — CEPHALEXIN 500 MG PO CAPS: 500.0000 mg | ORAL_CAPSULE | Freq: Three times a day (TID) | ORAL | Status: DC

## 2013-11-02 NOTE — Patient Instructions (Signed)
Cephalexin tablets or capsules What is this medicine? CEPHALEXIN (sef a LEX in) is a cephalosporin antibiotic. It is used to treat certain kinds of bacterial infections It will not work for colds, flu, or other viral infections. This medicine may be used for other purposes; ask your health care provider or pharmacist if you have questions. COMMON BRAND NAME(S): Biocef, Keflex, Keftab What should I tell my health care provider before I take this medicine? They need to know if you have any of these conditions: -kidney disease -stomach or intestine problems, especially colitis -an unusual or allergic reaction to cephalexin, other cephalosporins, penicillins, other antibiotics, medicines, foods, dyes or preservatives -pregnant or trying to get pregnant -breast-feeding How should I use this medicine? Take this medicine by mouth with a full glass of water. Follow the directions on the prescription label. This medicine can be taken with or without food. Take your medicine at regular intervals. Do not take your medicine more often than directed. Take all of your medicine as directed even if you think you are better. Do not skip doses or stop your medicine early. Talk to your pediatrician regarding the use of this medicine in children. While this drug may be prescribed for selected conditions, precautions do apply. Overdosage: If you think you have taken too much of this medicine contact a poison control center or emergency room at once. NOTE: This medicine is only for you. Do not share this medicine with others. What if I miss a dose? If you miss a dose, take it as soon as you can. If it is almost time for your next dose, take only that dose. Do not take double or extra doses. There should be at least 4 to 6 hours between doses. What may interact with this medicine? -probenecid -some other antibiotics This list may not describe all possible interactions. Give your health care provider a list of all  the medicines, herbs, non-prescription drugs, or dietary supplements you use. Also tell them if you smoke, drink alcohol, or use illegal drugs. Some items may interact with your medicine. What should I watch for while using this medicine? Tell your doctor or health care professional if your symptoms do not begin to improve in a few days. Do not treat diarrhea with over the counter products. Contact your doctor if you have diarrhea that lasts more than 2 days or if it is severe and watery. If you have diabetes, you may get a false-positive result for sugar in your urine. Check with your doctor or health care professional. What side effects may I notice from receiving this medicine? Side effects that you should report to your doctor or health care professional as soon as possible: -allergic reactions like skin rash, itching or hives, swelling of the face, lips, or tongue -breathing problems -pain or trouble passing urine -redness, blistering, peeling or loosening of the skin, including inside the mouth -severe or watery diarrhea -unusually weak or tired -yellowing of the eyes, skin Side effects that usually do not require medical attention (report to your doctor or health care professional if they continue or are bothersome): -gas or heartburn -genital or anal irritation -headache -joint or muscle pain -nausea, vomiting This list may not describe all possible side effects. Call your doctor for medical advice about side effects. You may report side effects to FDA at 1-800-FDA-1088. Where should I keep my medicine? Keep out of the reach of children. Store at room temperature between 59 and 86 degrees F (15 and 30  degrees C). Throw away any unused medicine after the expiration date. NOTE: This sheet is a summary. It may not cover all possible information. If you have questions about this medicine, talk to your doctor, pharmacist, or health care provider.  2015, Elsevier/Gold Standard.  (2007-04-28 17:09:13)

## 2013-11-02 NOTE — Addendum Note (Signed)
Addended by: [place], Tsion Inghram on: 11/02/2013 02:50 PM   Modules accepted: Orders

## 2013-11-02 NOTE — Progress Notes (Signed)
  Subjective:    Jodi [name] is a 80 y.o. female who complains of burning with urination, dysuria and frequency. She has had symptoms for 2 days. Patient also complains of None. Patient denies back pain, fever, stomach ache and chills. Patient does not have a history of recurrent UTI. Patient does not have a history of pyelonephritis.   The following portions of the patient's history were reviewed and updated as appropriate: allergies, current medications, past family history, past medical history, past social history, past surgical history and problem list.  Review of Systems Pertinent items are noted in HPI.    Objective:    BP 153/57  Pulse 63  Temp(Src) 98.1 F (36.7 C) (Oral)  Ht 5' 3.5" (1.613 m)  Wt 161 lb 4.8 oz (73.165 kg)  BMI 28.12 kg/m2  General Appearance:    Alert, cooperative, no distress, appears stated age  Back:     Symmetric, no curvature, ROM normal, no CVA tenderness  Lungs:     Clear to auscultation bilaterally, respirations unlabored  Chest Wall:    No tenderness or deformity   Heart:    Regular rate and rhythm, S1 and S2 normal, +2/6 SEM crescendo/decrescendo  RUSB  Abdomen:     Soft, non-tender, bowel sounds active all four quadrants,    no masses, no organomegaly    Laboratory:  Urine dipstick: trace for hemoglobin and 1+ for leukocyte esterase.   Micro exam: pending.    Assessment:   Dysuria   Plan:    If + for LE/microscopic, will tx with Keflex x 7 days

## 2013-11-05 LAB — URINE CULTURE: Colony Count: >=100000

## 2013-11-06 ENCOUNTER — Other Ambulatory Visit: Payer: Self-pay | Admitting: Family Medicine

## 2013-11-06 ENCOUNTER — Telehealth: Payer: Self-pay | Admitting: Family Medicine

## 2013-11-06 MED ORDER — TRAZODONE HCL 50 MG PO TABS: 25.0000 mg | ORAL_TABLET | Freq: Every evening | ORAL | Status: DC | PRN

## 2013-11-06 NOTE — Telephone Encounter (Signed)
Daughter called and would like to know if her mother can try Trazodone instead of the Ambien. Trazodone is on her mother's medication list as a preferred drug and she said it is not addictive. She really would like one month and she is having her mother stop taking the Ambien that month also to see if this is a better fit for her mother. Please call [name] at [phone]. Blima Rich

## 2013-11-06 NOTE — Telephone Encounter (Signed)
Listened to daughter.  Told her had to talk with patient.  Daughter understands risk benefits of hypnotic and feels her mother wuld function much better with it.  Spoke w patient she would like to try Trazadone and feels she is miserable without better sleep and that would be worth the risk of falls or fractures  Will call in

## 2013-11-13 ENCOUNTER — Encounter (HOSPITAL_COMMUNITY): Payer: Self-pay | Admitting: Pharmacy Technician

## 2013-11-17 ENCOUNTER — Encounter: Payer: Self-pay | Admitting: *Deleted

## 2013-11-18 ENCOUNTER — Ambulatory Visit (INDEPENDENT_AMBULATORY_CARE_PROVIDER_SITE_OTHER): Payer: Medicare Other | Admitting: Neurology

## 2013-11-18 ENCOUNTER — Other Ambulatory Visit: Payer: Self-pay | Admitting: Neurology

## 2013-11-18 ENCOUNTER — Encounter: Payer: Self-pay | Admitting: Neurology

## 2013-11-18 VITALS — BP 140/85 | HR 76 | Ht 67.0 in | Wt 164.0 lb

## 2013-11-18 DIAGNOSIS — G44219 Episodic tension-type headache, not intractable: Secondary | ICD-10-CM

## 2013-11-18 MED ORDER — TRAMADOL HCL 50 MG PO TABS: 50.0000 mg | ORAL_TABLET | Freq: Two times a day (BID) | ORAL | Status: DC | PRN

## 2013-11-18 NOTE — Progress Notes (Signed)
PATIENT: Jodi [name] DOB: Jun 26, 1932  HISTORICAL  Jodi [name] is a 80 years old right-handed Caucasian female, referred by her cardiologist Dr. Einar Gip. for evaluation of new onset headaches  She had past medical history of coronary artery disease, status post CABG, atrial fibrillation, on chronic Eliquis treatment, she also had a history of severe aortic valve stenosis, moderate to severe mitral valve regurgitation, per patient, she is a candidate for cardiac catheterization, potential intervention,  She also had a history of left eye macular degeneration, denies a previous history of headaches, about 6 months ago, since April 2015, she began to receive intraocular injection for her left macular degeneration by local ophthalmologist, most recent injection was about 3 months ago, in July 2015, few weeks later, around August 2015, she began to have almost daily left retro-orbital, left temporal region pressure headaches, mild to moderate, sometimes would exacerbated to a more severe pounding headaches, in November 10 2013, she had a prolonged severe left-sided headaches, with associated light noise sensitivity, tired easily, she contact her cardiologist Dr. Irven Shelling office, who had referred her here for further evaluation   She denied a previous history of headaches she denies chewing difficulty, no body tenderness  REVIEW OF SYSTEMS: Full 14 system review of systems performed and notable only for  shortness of breath with exertion, murmur, hearing loss, not enough sleep  ALLERGIES: Allergies  Allergen Reactions  . Sulfamethoxazole-Trimethoprim Other (See Comments)    REACTION: shaking /chills    HOME MEDICATIONS: Current Outpatient Prescriptions on File Prior to Visit  Medication Sig Dispense Refill  . amLODipine (NORVASC) 10 MG tablet       . apixaban (ELIQUIS) 5 MG TABS tablet Take 1 tablet (5 mg total) by mouth 2 (two) times daily.  60 tablet  1  . beta carotene w/minerals  (OCUVITE) tablet Take 1 tablet by mouth daily.      . Biotin 5000 MCG TABS Take 5,000 mcg by mouth 2 (two) times daily.      . carvedilol (COREG) 6.25 MG tablet Take 6.25 mg by mouth 2 (two) times daily with a meal.      . cholecalciferol (VITAMIN D) 1000 UNITS tablet Take 2,000 Units by mouth daily.       . Cholecalciferol (VITAMIN D3) 2000 UNITS TABS Take 2,000 Units by mouth daily.      . Cranberry 12600 MG CAPS Take 12,600 mg by mouth daily.      Marland Kitchen HYDROcodone-acetaminophen (NORCO) 10-325 MG per tablet Take 1 tablet by mouth every 8 (eight) hours as needed.  30 tablet  0  . ibuprofen (ADVIL,MOTRIN) 200 MG tablet Take 200 mg by mouth every 6 (six) hours as needed.      . Liniments (SALONPAS EX) Place 1 patch onto the skin daily as needed (for pain).      . Multiple Vitamin (MULTIVITAMIN) capsule Take 1 capsule by mouth daily.        . Probiotic Product (PROBIOTIC DAILY PO) Take by mouth.      . ramipril (ALTACE) 2.5 MG capsule Take 2.5 mg by mouth daily.      . rosuvastatin (CRESTOR) 20 MG tablet Take 20 mg by mouth daily.      Marland Kitchen spironolactone (ALDACTONE) 25 MG tablet Take 25 mg by mouth daily.      . traZODone (DESYREL) 50 MG tablet Take 0.5 tablets (25 mg total) by mouth at bedtime as needed for sleep.  15 tablet  3  . zolpidem (  AMBIEN) 5 MG tablet        No current facility-administered medications on file prior to visit.    PAST MEDICAL HISTORY: Past Medical History  Diagnosis Date  . Myocardial infarction   . Coronary artery disease   . Hypertension   . Heart murmur   . Chronic kidney disease   . Aortic stenosis   . CHF (congestive heart failure)   . Hypercholesteremia   . Shortness of breath   . Stroke   . Renal mass 12/06/2010    CT Abdomen 11-27-10 upper pole region of the right kidney which is suspicious for solid lesion, measuring 1.9 x 1.3 cm. This lesion is concerning for renal cell carcinoma given the solid appearance.  Following with Dr [name].  Renal bx was benign     . Splenic infarct 12/06/2010  . Renal cell carcinoma 09/23/2012  . Hyperlipidemia     PAST SURGICAL HISTORY: Past Surgical History  Procedure Laterality Date  . Radical hysterectomy    . Coronary artery bypass graft  1995 and 2003  . Abdominal hysterectomy    . Eye surgery    . Cardiac catheterization    . Cardioversion N/A 10/20/2013    Procedure: CARDIOVERSION;  Surgeon: Laverda Page, MD;  Location: [facility] END[facility]COPY;  Service: Cardiovascular;  Laterality: N/A;  H&P in file  . Percutaneous needle biopsy of renal lesion      FAMILY HISTORY: Family History  Problem Relation Age of Onset  . Coronary artery disease Mother   . Coronary artery disease Brother   . Stroke Mother   . Heart attack Brother   . Heart attack Son   . Heart attack Daughter   . Heart attack Sister     SOCIAL HISTORY:  History   Social History  . Marital Status: Widowed    Spouse Name: N/A    Number of Children: 8  . Years of Education: 10   Occupational History  . retired-mill worker    Social History Main Topics  . Smoking status: Never Smoker   . Smokeless tobacco: Never Used  . Alcohol Use: No  . Drug Use: No  . Sexual Activity: Not Currently   Other Topics Concern  . Not on file   Social History Narrative   Health Care POA:    Emergency Contact: son Jodi [name]   End of Life Plan: reports done, encourage to bring [place] a copy   Who lives with you: self- retirement community   Any pets: none   Diet: Pt has varied diet of protein, starch, vegetables   Exercise: Pt reports walking almost every day   Seatbelts: Pt reports wearing seatbelt when in vehicles.    Sun Exposure/Protection: sunglasses   Hobbies: church, walking, visiting friends                 PHYSICAL EXAM   Filed Vitals:   11/18/13 1012  Height: 5' 7"  (1.702 m)  Weight: 164 lb (74.39 kg)    Not recorded    Body mass index is 25.68 kg/(m^2).   Generalized: In no acute distress  Neck: Supple, no carotid  bruits   Cardiac: Regular rate rhythm  Pulmonary: Clear to auscultation bilaterally  Musculoskeletal: No deformity  Neurological examination  Mentation: Alert oriented to time, place, history taking, and causual conversation  Cranial nerve II-XII: Pupils were equal round reactive to light. Extraocular movements were full.  Visual field were full on confrontational test. Bilateral fundi were sharp.  Facial sensation  and strength were normal. Hearing was intact to finger rubbing bilaterally. Uvula tongue midline.  Head turning and shoulder shrug and were normal and symmetric.Tongue protrusion into cheek strength was normal.  Motor: Normal tone, bulk and strength.  Sensory: Intact to fine touch, pinprick, preserved vibratory sensation, and proprioception at toes.  Coordination: Normal finger to nose, heel-to-shin bilaterally there was no truncal ataxia  Gait: Rising up from seated position without assistance, normal stance, without trunk ataxia, moderate stride, good arm swing, smooth turning, able to perform tiptoe, and heel walking without difficulty.   Romberg signs: Negative  Deep tendon reflexes: Brachioradialis 2/2, biceps 2/2, triceps 2/2, patellar 2/2, Achilles trace, plantar responses were flexor bilaterally.   DIAGNOSTIC DATA (LABS, IMAGING, TESTING) - I reviewed patient records, labs, notes, testing and imaging myself where available.  Lab Results  Component Value Date   WBC 7.8 08/20/2013   HGB 13.6 10/20/2013   HCT 40.0 10/20/2013   MCV 90.0 08/20/2013   PLT 221 08/20/2013      Component Value Date/Time   NA 140 10/20/2013 1009   K 4.6 10/20/2013 1009   CL 105 10/20/2013 1009   CO2 21 08/20/2013 2000   GLUCOSE 115* 10/20/2013 1009   BUN 30* 10/20/2013 1009   CREATININE 1.50* 10/20/2013 1009   CREATININE 1.27* 08/06/2013 1413   CALCIUM 10.2 08/20/2013 2000   PROT 7.5 04/13/2013 1504   ALBUMIN 4.6 04/13/2013 1504   AST 26 04/13/2013 1504   ALT 26 04/13/2013 1504   ALKPHOS 70  04/13/2013 1504   BILITOT 0.9 04/13/2013 1504   GFRNONAA 46* 08/20/2013 2000   GFRNONAA 41* 08/06/2013 1413   GFRAA 53* 08/20/2013 2000   GFRAA 47* 08/06/2013 1413   Lab Results  Component Value Date   CHOL 181 11/10/2012   HDL 50 11/10/2012   LDLCALC 82 11/10/2012   LDLDIRECT 79 10/22/2011   TRIG 247* 11/10/2012   CHOLHDL 3.6 11/10/2012   Lab Results  Component Value Date   HGBA1C 6.4* 11/29/2010   Lab Results  Component Value Date   VITAMINB12 1388* 04/20/2013   Lab Results  Component Value Date   TSH 1.070 08/20/2013      ASSESSMENT AND PLAN  Jodi [name] is a 80 y.o. female complains of frequent headaches, since August 2015, this is following her intralobular injection for her left macular degeneration,     1, new-onset headache in elderly, her headaches are consistent with tension headaches, some migraine features, need to rule out structural lesions, she has multiple vascular risk factors, planning on to have cardiac catheterization, potential intervention for aortic stenosis, mitral valve regurgitation, 2, ESR C. reactive protein to rule out temporal arteritis, this will be done at the outside laboratory, she will fax results 3. Return to clinic in one month, tramadol prn  [name], M.D. Ph.D.  [facility] [address] [place],  [id number] [phone]

## 2013-11-19 LAB — TSH: TSH: 2.01 u[IU]/mL (ref 0.450–4.500)

## 2013-11-19 LAB — SEDIMENTATION RATE: Sed Rate: 13 mm/hr (ref 0–40)

## 2013-11-19 LAB — C-REACTIVE PROTEIN: CRP: 2.1 mg/L (ref 0.0–4.9)

## 2013-11-23 ENCOUNTER — Encounter: Payer: Self-pay | Admitting: Family Medicine

## 2013-11-23 ENCOUNTER — Ambulatory Visit (INDEPENDENT_AMBULATORY_CARE_PROVIDER_SITE_OTHER): Payer: PRIVATE HEALTH INSURANCE | Admitting: Family Medicine

## 2013-11-23 VITALS — BP 172/86 | HR 69 | Temp 98.2°F

## 2013-11-23 DIAGNOSIS — L219 Seborrheic dermatitis, unspecified: Secondary | ICD-10-CM

## 2013-11-23 DIAGNOSIS — B850 Pediculosis due to Pediculus humanus capitis: Secondary | ICD-10-CM

## 2013-11-23 DIAGNOSIS — G4489 Other headache syndrome: Secondary | ICD-10-CM

## 2013-11-23 DIAGNOSIS — I10 Essential (primary) hypertension: Secondary | ICD-10-CM

## 2013-11-23 MED ORDER — TRIAMCINOLONE ACETONIDE 0.1 % EX CREA: 1.0000 "application " | TOPICAL_CREAM | Freq: Two times a day (BID) | CUTANEOUS | Status: DC

## 2013-11-23 NOTE — Progress Notes (Addendum)
   Subjective:    Patient ID: Jodi [name], female    DOB: 12/25/1932, 80 y.o.   MRN: [id number]  HPI  Head Lice Found by family to have head lice 2 weeks ago.  Thinks got from church.  Treated once with Rid OTC but did not retreat and came back.  Treated again and has not seen any.  No itching elsewhere or rash,  Head Eczema Is having a flare of this and ran out of steroid cream.  Similar to what had before.  No bleeding or pain.    HYPERTENSION Disease Monitoring Home BP Monitoring Not checking Chest pain- no    Dyspnea- no Medications Compliance-  Dr Einar Gip told to stop norvasc and has stopped spironolactone for cath tomorrow. Lightheadedness-  no  Edema- no ROS - See HPI  PMH Lab Review   Potassium  Date Value Ref Range Status  10/20/2013 4.6  3.7 - 5.3 mEq/L Final     Sodium  Date Value Ref Range Status  10/20/2013 140  137 - 147 mEq/L Final     Creat  Date Value Ref Range Status  08/06/2013 1.27* 0.50 - 1.10 mg/dL Final     Creatinine, Ser  Date Value Ref Range Status  10/20/2013 1.50* 0.50 - 1.10 mg/dL Final        Chief Complaint noted Review of Symptoms - see HPI PMH - Smoking status noted.   Vital Signs reviewed    Review of Systems     Objective:   Physical Exam Alert no acute distress  Scalp - a few dead lice and nits seen.  Scattered areas of mild skin irritation no discrete lesions Blood pressure noted No edema Heart - irregular with m no change Lungs:  Normal respiratory effort, chest expands symmetrically. Lungs are clear to auscultation, no crackles or wheezes.        Assessment & Plan:    Head Lice - continue retreatment with permethrin as scheduled

## 2013-11-23 NOTE — Patient Instructions (Addendum)
Good to see you today!  Thanks for coming in.  I would retreat the lice with Rid or Nix which is permethrin.  i would not use malathalion unless it doesn't work  Use the cream on the sore on your scalp Not more than 2 weeks straight  Good luck with the catheraztion

## 2013-11-23 NOTE — Assessment & Plan Note (Signed)
None now.  ESR was normal

## 2013-11-23 NOTE — Assessment & Plan Note (Signed)
Not at goal today likely because off some medications for impending cath.  Continue current regimen once this is done

## 2013-11-23 NOTE — Assessment & Plan Note (Signed)
Flare.  Continue steroid treatment

## 2013-11-24 ENCOUNTER — Encounter (HOSPITAL_COMMUNITY)
Admission: RE | Disposition: A | Discharge status: Home / Self Care | Payer: Self-pay | Source: Ambulatory Visit | Attending: Cardiology

## 2013-11-24 ENCOUNTER — Ambulatory Visit (HOSPITAL_COMMUNITY)
Admission: RE | Admit: 2013-11-24 | Discharge: 2013-11-24 | Disposition: A | Discharge status: Home / Self Care | Payer: PRIVATE HEALTH INSURANCE | Source: Ambulatory Visit | Attending: Cardiology | Admitting: Cardiology

## 2013-11-24 DIAGNOSIS — I129 Hypertensive chronic kidney disease with stage 1 through stage 4 chronic kidney disease, or unspecified chronic kidney disease: Secondary | ICD-10-CM | POA: Diagnosis not present

## 2013-11-24 DIAGNOSIS — Z951 Presence of aortocoronary bypass graft: Secondary | ICD-10-CM | POA: Insufficient documentation

## 2013-11-24 DIAGNOSIS — Z6827 Body mass index (BMI) 27.0-27.9, adult: Secondary | ICD-10-CM | POA: Diagnosis not present

## 2013-11-24 DIAGNOSIS — I2510 Atherosclerotic heart disease of native coronary artery without angina pectoris: Secondary | ICD-10-CM | POA: Diagnosis not present

## 2013-11-24 DIAGNOSIS — I272 Other secondary pulmonary hypertension: Secondary | ICD-10-CM | POA: Insufficient documentation

## 2013-11-24 DIAGNOSIS — I080 Rheumatic disorders of both mitral and aortic valves: Secondary | ICD-10-CM | POA: Diagnosis not present

## 2013-11-24 DIAGNOSIS — I350 Nonrheumatic aortic (valve) stenosis: Secondary | ICD-10-CM | POA: Diagnosis present

## 2013-11-24 DIAGNOSIS — I5032 Chronic diastolic (congestive) heart failure: Secondary | ICD-10-CM | POA: Insufficient documentation

## 2013-11-24 DIAGNOSIS — E669 Obesity, unspecified: Secondary | ICD-10-CM | POA: Insufficient documentation

## 2013-11-24 DIAGNOSIS — E785 Hyperlipidemia, unspecified: Secondary | ICD-10-CM | POA: Diagnosis not present

## 2013-11-24 DIAGNOSIS — N183 Chronic kidney disease, stage 3 (moderate): Secondary | ICD-10-CM | POA: Insufficient documentation

## 2013-11-24 DIAGNOSIS — Z7901 Long term (current) use of anticoagulants: Secondary | ICD-10-CM | POA: Insufficient documentation

## 2013-11-24 DIAGNOSIS — I25810 Atherosclerosis of coronary artery bypass graft(s) without angina pectoris: Secondary | ICD-10-CM | POA: Insufficient documentation

## 2013-11-24 HISTORY — PX: LEFT AND RIGHT HEART CATHETERIZATION WITH CORONARY ANGIOGRAM: SHX5449

## 2013-11-24 LAB — POCT I-STAT 3, VENOUS BLOOD GAS (G3P V)
Acid-base deficit: 1 mmol/L (ref 0.0–2.0)
Acid-base deficit: 1 mmol/L (ref 0.0–2.0)
Bicarbonate: 25.2 mEq/L — ABNORMAL HIGH (ref 20.0–24.0)
Bicarbonate: 25.4 mEq/L — ABNORMAL HIGH (ref 20.0–24.0)
O2 Saturation: 65 %
O2 Saturation: 71 %
TCO2: 27 mmol/L (ref 0–100)
TCO2: 27 mmol/L (ref 0–100)
pCO2, Ven: 47.4 mmHg (ref 45.0–50.0)
pCO2, Ven: 48.4 mmHg (ref 45.0–50.0)
pH, Ven: 7.325 — ABNORMAL HIGH (ref 7.250–7.300)
pH, Ven: 7.337 — ABNORMAL HIGH (ref 7.250–7.300)
pO2, Ven: 36 mmHg (ref 30.0–45.0)
pO2, Ven: 40 mmHg (ref 30.0–45.0)

## 2013-11-24 LAB — POCT I-STAT 3, ART BLOOD GAS (G3+)
Acid-base deficit: 1 mmol/L (ref 0.0–2.0)
Bicarbonate: 23.8 mEq/L (ref 20.0–24.0)
O2 Saturation: 99 %
TCO2: 25 mmol/L (ref 0–100)
pCO2 arterial: 39.2 mmHg (ref 35.0–45.0)
pH, Arterial: 7.391 (ref 7.350–7.450)
pO2, Arterial: 129 mmHg — ABNORMAL HIGH (ref 80.0–100.0)

## 2013-11-24 SURGERY — LEFT AND RIGHT HEART CATHETERIZATION WITH CORONARY ANGIOGRAM
Anesthesia: LOCAL

## 2013-11-24 MED ORDER — MIDAZOLAM HCL 2 MG/2ML IJ SOLN
INTRAMUSCULAR | Status: AC
  Filled 2013-11-24: qty 2

## 2013-11-24 MED ORDER — FENTANYL CITRATE 0.05 MG/ML IJ SOLN
50.0000 ug | Freq: Once | INTRAMUSCULAR | Status: AC
  Administered 2013-11-24: 50 ug via INTRAVENOUS

## 2013-11-24 MED ORDER — SODIUM CHLORIDE 0.9 % IV SOLN
INTRAVENOUS | Status: DC
Start: 2013-11-25 — End: 2013-11-24

## 2013-11-24 MED ORDER — SODIUM CHLORIDE 0.9 % IV SOLN: 250.0000 mL | INTRAVENOUS | Status: DC | PRN

## 2013-11-24 MED ORDER — ASPIRIN 81 MG PO CHEW: 81.0000 mg | CHEWABLE_TABLET | ORAL | Status: DC

## 2013-11-24 MED ORDER — SODIUM CHLORIDE 0.9 % IV SOLN: 1.0000 mL/kg/h | INTRAVENOUS | Status: DC

## 2013-11-24 MED ORDER — LIDOCAINE HCL (PF) 1 % IJ SOLN
INTRAMUSCULAR | Status: AC
  Filled 2013-11-24: qty 30

## 2013-11-24 MED ORDER — FENTANYL CITRATE 0.05 MG/ML IJ SOLN
INTRAMUSCULAR | Status: AC
  Filled 2013-11-24: qty 2

## 2013-11-24 MED ORDER — SODIUM CHLORIDE 0.9 % IJ SOLN: 3.0000 mL | Freq: Two times a day (BID) | INTRAMUSCULAR | Status: DC

## 2013-11-24 MED ORDER — NITROGLYCERIN 1 MG/10 ML FOR IR/CATH LAB
INTRA_ARTERIAL | Status: AC
  Filled 2013-11-24: qty 10

## 2013-11-24 MED ORDER — HEPARIN (PORCINE) IN NACL 2-0.9 UNIT/ML-% IJ SOLN
INTRAMUSCULAR | Status: AC
  Filled 2013-11-24: qty 1000

## 2013-11-24 MED ORDER — SODIUM CHLORIDE 0.9 % IJ SOLN
3.0000 mL | INTRAMUSCULAR | Status: DC | PRN
Start: 2013-11-24 — End: 2013-11-24

## 2013-11-24 MED ORDER — SODIUM CHLORIDE 0.9 % IV BOLUS (SEPSIS): 500.0000 mL | Freq: Once | INTRAVENOUS | Status: DC

## 2013-11-24 NOTE — CV Procedure (Signed)
Procedures performed: Right and left heart catheterization and occlusion of cardiac output and cardiac index by Fick and thermodilutaion. Right femoral arterial access and right femoral vein access was utilized for performing the procedure.   Indication: Patient is a 80 year-old Caucasian female with hyperlipidemia, hypertension,   obesity, stage III chronic kidney disease, known coronary artery disease with redo CABG, who was found to have severe aortic stenosis by outpatient echocardiogram. Due to dyspnea and recent onset of atrial fibrillation, elevation of aortic stenosis like to possible consideration of endovascular repair/ EVAR, hence prior to proceeding with this, she is brought to the coronary angiography suite for left and right heart catheterization to evaluate coronary anatomy and also evaluate for pulmonary hypertension and cardiac output.  Procedural data: Right heart catheterization: RA pressure 9/10  Mean 7 mm mercury. RA saturation 72 %.  RV pressure 44/2 and Right ventricular EDP 7 mm Hg. PA pressure 46/18 with a mean of 31  mm mercury. PA saturation 63 %.  Pulmonary capillary wedge 30/29 with a mean of 24 mm Hg. Aortic saturation 99 %.  Cardiac output was 3.96 with cardiac index of 2.2 to  by Fick.  Cardiac output was 3.78 with cardiac index of 2.12 by thermodilution. Normal Qp/Qs ratio.  Left catheterization: LV pressure 0000000 with end diastolic pressure 17 mm mercury. Aortic pressure 161/75 with a mean of 109 mm mercury. There was a peak to peak gradient of 11 mm mercury, mean gradient of 14 mm mercury, calculated aortic valve area of 1.02 cm with a valve index of 0.57 consistent with moderate aortic stenosis.  Angiographic data  Left ventricle: Left systolic shows normal ejection fraction of 40-45% with mild global hypokinesis, basal to mid inferior akinesis and mild infra-apical hypokinesis. There was 3+ mitral regurgitation. Left atrium was markedly enlarged.    Right  coronary artery: It is dominant, occluded in the midsegment. Distal RCA supplied by saphenous graft.  SVG to RCA: SVG to RCA/PDA is widely patent. However the ostium of the saphenous vein graft has a high-grade 90-95% stenosis.  Left main coronary artery: Normal. No stenosis. Mild calcification is evident.   LAD: Moderate sized, smooth and occluded in the proximal segment. Distal LAD is supplied by LIMA to LAD.  LIMA to LAD: Widely patent.  Circumflex coronary artery: Smooth normal. Non-dominant. The OM1 is occluded in the proximal segment. OM1 is supplied by saphenous vein graft.  SVG to OM1: Occluded.  Ramus intermediate: Occluded in the proximal segment. Free RIMA to ramus intermediate supplies this vessel.   RIMA to ramus intermediate: It is widely patent, the previously placed SVG to ramus which is occluded on prior cardiac catheterization has been reimplanted into the proximal RIMA graft and supplies a small branch of what appears to be either distal circumflex coronary artery or branch of the ramus intermediate and is patent.   Impression: The aortic stenosis does not appear to be critical, appears to be moderate at most, mitral regurgitation is to be 3+ with markedly enlarged left atrium. LVEF 40-45% with basal inferior mid inferior akinesis and mild apical hypokinesis and mild global hypokinesis.  High-grade stenosis of SVG to RCA in the ostium. LIMA to LAD, SVG to small OM1 and free RIMA to ramus intermediate are widely patent. Moderate pulmonary hypertension with elevated wedge pressure. A total of 75 cc of contrast was utilized for diagnostic angiography.  Recommendation: At this point I would recommend medical therapy for valvular heart disease. Patient has multiple medical comorbidities, and  would not be a candidate for mitral valve repair along with aortic valve replacement. I will also review the angiograms and hemodynamic data with Dr. Sherren Mocha to see his thoughts. With  regard to high-grade stenosis of SVG to RCA, she'll need planned intervention to preserve the vein graft. To conserve contrast no intervention was performed at this setting.  Technique of the procedure:  A 7 French  sheath introduced into the right femoral vein access. A 5 French right femoral arterial access was obtained. A 7 French Swan-Ganz catheter was advanced with balloon inflated on the sheath under fluoroscopic guidance via right femoral venous access sheath into first the right atrium followed by the right ventricle and into the pulmonary artery to pulmonary artery wedge position. Hemodynamics were obtained in all locations. I had to use a 0.025 inch J-wire for support and advanced to the PA and coronary Reweighed position.After hemodynamics were completed, samples were taken for SaO2% measurement to be used in [facility] /Index catheterization. Thermodilution was also performed for evaluation of cardiac output. The catheter was then pulled back the balloon down and then completely out of the body.   Left Heart Catheterization   First a 5 [place] multipurpose B2 catheter was advanced over standard J-wire into the ascending aorta, I was able to cross the aortic valve in place the cath into the left ventricle. I then exchanged the B2 catheter over a long 190 cm J-wire to a 5 French pigtail catheter. Left epidurography was performed in the RAO projection, the catheter was then gently pulled into the ascending aorta and pressure gradients were monitored. The catheter was then exchanged to the 5 [place] B-2 catheter and vein graft to the right coronary artery and free RIMA to the ramus intermediate was cannulated and angiography was performed. The cath exchanged to a Judkins left 4 diagnostic catheter and left main coronary artery selective engaged and angiography was performed. The left internal mammary artery was cannulated with a 5 French LIMA catheter. Catheter exchange this was done over J-wire.

## 2013-11-24 NOTE — Progress Notes (Signed)
Lateral growth/raised area noted at RFA/V site at 0929. Pressure reapplied for a total of over 60 min. Site faintly bruised and non raised,soft to palpation post.New bedrest start at 1015.Site area:  Site Prior to Removal:  Level  Pressure Applied For: Manual:    Patient Status During Pull: stable  Post Pull Site:  Level 0-1 Post Pull Instructions Given:   Post Pull Pulses Present:  Dressing Applied:  clear Bedrest begins @ 1015 Comments:stable

## 2013-11-24 NOTE — Discharge Instructions (Signed)
Angiogram, Care After °Refer to this sheet in the next few weeks. These instructions provide you with information on caring for yourself after your procedure. Your health care provider may also give you more specific instructions. Your treatment has been planned according to current medical practices, but problems sometimes occur. Call your health care provider if you have any problems or questions after your procedure.  °WHAT TO EXPECT AFTER THE PROCEDURE °After your procedure, it is typical to have the following sensations: °· Minor discomfort or tenderness and a small bump at the catheter insertion site. The bump should usually decrease in size and tenderness within 1 to 2 weeks. °· Any bruising will usually fade within 2 to 4 weeks. °HOME CARE INSTRUCTIONS  °· You may need to keep taking blood thinners if they were prescribed for you. Take medicines only as directed by your health care provider. °· Do not apply powder or lotion to the site. °· Do not take baths, swim, or use a hot tub until your health care provider approves. °· You may shower 24 hours after the procedure. Remove the bandage (dressing) and gently wash the site with plain soap and water. Gently pat the site dry. °· Inspect the site at least twice daily. °· Limit your activity for the first 48 hours. Do not bend, squat, or lift anything over 20 lb (9 kg) or as directed by your health care provider. °· Plan to have someone take you home after the procedure. Follow instructions about when you can drive or return to work. °SEEK MEDICAL CARE IF: °· You get light-headed when standing up. °· You have drainage (other than a small amount of blood on the dressing). °· You have chills. °· You have a fever. °· You have redness, warmth, swelling, or pain at the insertion site. °SEEK IMMEDIATE MEDICAL CARE IF:  °· You develop chest pain or shortness of breath, feel faint, or pass out. °· You have bleeding, swelling larger than a walnut, or drainage from the  catheter insertion site. °· You develop pain, discoloration, coldness, or severe bruising in the leg or arm that held the catheter. °· You have heavy bleeding from the site. If this happens, hold pressure on the site and call 911. °MAKE SURE YOU: °· Understand these instructions. °· Will watch your condition. °· Will get help right away if you are not doing well or get worse. °Document Released: 08/10/2004 Document Revised: 06/08/2013 Document Reviewed: 06/16/2012 °Exit[employer]® Patient Information ©2015 [employer]. This information is not intended to replace advice given to you by your health care provider. Make sure you discuss any questions you have with your health care provider. ° °

## 2013-11-24 NOTE — Progress Notes (Signed)
Site area: RFA/RFV Site Prior to Removal:  Level  0 Pressure Applied For:20 min Manual:    Patient Status During Pull: stable  Post Pull Site:  Level 0 Post Pull Instructions Given:  done Post Pull Pulses Present:  palpable Dressing Applied:  clear Bedrest begins @ [id number] Comments:no complications

## 2013-11-24 NOTE — H&P (Signed)
  Please see office visit notes for complete details of HPI.  

## 2013-11-24 NOTE — Progress Notes (Signed)
Quick Note:  Called Patient normal labs. ______

## 2013-11-24 NOTE — Interval H&P Note (Signed)
History and Physical Interval Note:  11/24/2013 7:47 AM  Jodi [name]  has presented today for surgery, with the diagnosis of arotic stenosis/afib  The various methods of treatment have been discussed with the patient and family. After consideration of risks, benefits and other options for treatment, the patient has consented to  Procedure(s): LEFT AND RIGHT HEART CATHETERIZATION WITH CORONARY ANGIOGRAM (N/A) as a surgical intervention to evaluate coronary anatomy prior to possible endovascular therapy for aortic stenosis .  The patient's history has been reviewed, patient examined, no change in status, stable for surgery.  I have reviewed the patient's chart and labs.  Questions were answered to the patient's satisfaction.     Laverda Page

## 2013-12-01 ENCOUNTER — Ambulatory Visit (HOSPITAL_COMMUNITY)
Admission: RE | Admit: 2013-12-01 | Payer: PRIVATE HEALTH INSURANCE | Source: Ambulatory Visit | Admitting: Cardiovascular Disease

## 2013-12-01 ENCOUNTER — Ambulatory Visit
Admission: RE | Admit: 2013-12-01 | Discharge: 2013-12-01 | Disposition: A | Discharge status: Home / Self Care | Payer: PRIVATE HEALTH INSURANCE | Source: Ambulatory Visit | Attending: Neurology | Admitting: Neurology

## 2013-12-01 ENCOUNTER — Encounter (HOSPITAL_COMMUNITY): Admission: RE | Payer: Self-pay | Source: Ambulatory Visit

## 2013-12-01 DIAGNOSIS — G44219 Episodic tension-type headache, not intractable: Secondary | ICD-10-CM

## 2013-12-01 SURGERY — LEFT AND RIGHT HEART CATHETERIZATION WITH CORONARY ANGIOGRAM
Anesthesia: LOCAL

## 2013-12-03 ENCOUNTER — Telehealth: Payer: Self-pay | Admitting: Neurology

## 2013-12-03 NOTE — Telephone Encounter (Signed)
I have called her, MRI brain showed chronic changes.  MRI brain (without) demonstrating:  1. Moderate periventricular and subcortical and juxtacortical chronic small vessel ischemic disease. Associated mild ventriculomegaly on ex vacuo basis.  2. Mild bilateral cerebellar atrophy vs chronic ischemic changes.  3. No acute findings.

## 2013-12-21 ENCOUNTER — Ambulatory Visit: Payer: Medicare Other | Admitting: Neurology

## 2013-12-21 ENCOUNTER — Telehealth: Payer: Self-pay | Admitting: *Deleted

## 2013-12-21 ENCOUNTER — Other Ambulatory Visit: Payer: Self-pay | Admitting: Family Medicine

## 2013-12-21 MED ORDER — TRAZODONE HCL 100 MG PO TABS: 100.0000 mg | ORAL_TABLET | Freq: Every evening | ORAL | Status: DC | PRN

## 2013-12-21 NOTE — Telephone Encounter (Signed)
Patient daughter calling stating that her mother is currently on 50 mg of trazadone but is having to take 100mg  in order to fall asleep. Patient due pick up a refill on this medication tomm but would like to know if MD could increase it to 100mg  at bedtime instead of 50mg  (they will hold off on getting refill until they hear from MD). Daughter states that you can call either her or her mother back directly with response.

## 2013-12-21 NOTE — Telephone Encounter (Signed)
Daughter is aware of new rx change. Henry Utsey,CMA

## 2013-12-21 NOTE — Telephone Encounter (Signed)
I sent in an rx for 100 mg tablets Please let them know do not take with ambien Thanks [place]

## 2013-12-21 NOTE — Telephone Encounter (Signed)
Will forward to MD to see if he will increase medication dose. Fatisha Rabalais,CMA

## 2013-12-23 ENCOUNTER — Other Ambulatory Visit: Payer: Self-pay | Admitting: Family Medicine

## 2014-01-04 ENCOUNTER — Encounter: Payer: Self-pay | Admitting: *Deleted

## 2014-01-04 NOTE — Progress Notes (Unsigned)
Provider received a prior authorization form from pt for Zolpidem 5 mg tab.  PA form was completed and faxed to [facility] for review.  Derl Barrow, RN

## 2014-01-05 NOTE — Progress Notes (Unsigned)
PA for Zolpidem was denied per [facility].  Reference number:  [id number].  Denial placed in provider box for review.  Derl Barrow, RN

## 2014-01-14 ENCOUNTER — Encounter (HOSPITAL_COMMUNITY): Payer: Self-pay | Admitting: Cardiology

## 2014-01-19 ENCOUNTER — Other Ambulatory Visit: Payer: Self-pay | Admitting: Family Medicine

## 2014-02-08 ENCOUNTER — Other Ambulatory Visit: Payer: Self-pay | Admitting: Family Medicine

## 2014-02-17 ENCOUNTER — Encounter: Payer: Self-pay | Admitting: Family Medicine

## 2014-02-17 ENCOUNTER — Telehealth: Payer: Self-pay | Admitting: Family Medicine

## 2014-02-17 ENCOUNTER — Ambulatory Visit (INDEPENDENT_AMBULATORY_CARE_PROVIDER_SITE_OTHER): Payer: 59 | Admitting: Family Medicine

## 2014-02-17 VITALS — BP 151/80 | HR 93 | Temp 97.5°F | Wt 158.0 lb

## 2014-02-17 DIAGNOSIS — R5383 Other fatigue: Secondary | ICD-10-CM | POA: Diagnosis present

## 2014-02-17 LAB — CBC
HCT: 41.2 % (ref 36.0–46.0)
Hemoglobin: 13.4 g/dL (ref 12.0–15.0)
MCH: 29.5 pg (ref 26.0–34.0)
MCHC: 32.5 g/dL (ref 30.0–36.0)
MCV: 90.5 fL (ref 78.0–100.0)
MPV: 10.2 fL (ref 8.6–12.4)
Platelets: 223 10*3/uL (ref 150–400)
RBC: 4.55 MIL/uL (ref 3.87–5.11)
RDW: 15.1 % (ref 11.5–15.5)
WBC: 7.7 10*3/uL (ref 4.0–10.5)

## 2014-02-17 LAB — COMPREHENSIVE METABOLIC PANEL
ALT: 21 U/L (ref 0–35)
AST: 25 U/L (ref 0–37)
Albumin: 4 g/dL (ref 3.5–5.2)
Alkaline Phosphatase: 74 U/L (ref 39–117)
BUN: 28 mg/dL — ABNORMAL HIGH (ref 6–23)
CO2: 26 mEq/L (ref 19–32)
Calcium: 9.8 mg/dL (ref 8.4–10.5)
Chloride: 102 mEq/L (ref 96–112)
Creat: 1.25 mg/dL — ABNORMAL HIGH (ref 0.50–1.10)
Glucose, Bld: 105 mg/dL — ABNORMAL HIGH (ref 70–99)
Potassium: 5 mEq/L (ref 3.5–5.3)
Sodium: 140 mEq/L (ref 135–145)
Total Bilirubin: 1.4 mg/dL — ABNORMAL HIGH (ref 0.2–1.2)
Total Protein: 7.1 g/dL (ref 6.0–8.3)

## 2014-02-17 LAB — TSH: TSH: 3.342 u[IU]/mL (ref 0.350–4.500)

## 2014-02-17 NOTE — Progress Notes (Signed)
   Subjective:    Patient ID: Jodi [name], female    DOB: [birth year], 81 y.o.   MRN: [id number]  HPI  FATIGUE Patient complains of fatigue for the last month or so - last felt well around Thanksgiving. The Tiredness is like she gives out while walking or cleaning.  Feels shortness of breath and poor energy.  Symptoms Fever: no Sweating at night: no Weight Loss: no Shortness of Breath: yes.  This seems better when she takes Lasix which she does  Coughing up Blood: no Muscle Pain or Weakness: no Black or bloody Stools: no Severe Snoring or Daytime Sleepiness: Cant sleep at night - a long standing problem.  Takes Ambien when she can afford which really helps.  Takes tylenol PM which makes her groggy Feeling Down: no Not enjoying things: no Rash: no Leg or Joint Swelling: no  ROS - Please see HPI Smoking Status Noted   CHF Taking medications as prescribed. Uses lasix as needed usually 2-3 times a week.  No significant leg swelling.  Sleeps flat at night on one pillow   Chief Complaint noted Review of Symptoms - see HPI PMH - Smoking status noted.   Vital Signs reviewed    Review of Systems     Objective:   Physical Exam Alert mildly tired appearing.   Knows medications and medical history  Neck:  No deformities, thyromegaly, masses, or tenderness noted.   Supple with full range of motion without pain. Lungs:  Normal respiratory effort, chest expands symmetrically. Lungs are clear to auscultation, no crackles or wheezes. Heart - irregular irregular with loud murmur - unchanged from prior Extremities:  No cyanosis, trace edema Skin:  Intact without suspicious lesions or rashes Abdomen: soft and non-tender without masses, organomegaly or hernias noted.  No guarding or rebound        Assessment & Plan:

## 2014-02-17 NOTE — Telephone Encounter (Signed)
Pt called because she forgot to get Dr. Erin Hearing to fill out her handicap form. She would like him to fill this out and then mail it to her hoe. Address in Epic is correct. jw

## 2014-02-17 NOTE — Assessment & Plan Note (Addendum)
New problem for her.  She is usually very active given her chronic cardiac condition.   May be progression of this.  However many other possibilities so will check labs.  No signs of chronic infections or specific cancer.   If lab work up is not revealing will likely refer back to Dr Einar Gip.

## 2014-02-17 NOTE — Patient Instructions (Signed)
Good to see you today!  Thanks for coming in.  I will call you if your tests are not good.  Otherwise I will send you a letter.  If you do not hear from me with in 2 weeks please call our office.     Check to see if you are taking - norvasc - amlodipine

## 2014-02-18 ENCOUNTER — Telehealth: Payer: Self-pay | Admitting: Family Medicine

## 2014-02-18 NOTE — Telephone Encounter (Signed)
Let her labs do not explain her fatigue and suggested making an appointment with Dr Einar Gip to dicuss her cardica condition

## 2014-02-19 NOTE — Telephone Encounter (Signed)
Placed in mail

## 2014-02-22 ENCOUNTER — Telehealth: Payer: Self-pay | Admitting: Family Medicine

## 2014-02-22 ENCOUNTER — Other Ambulatory Visit: Payer: Self-pay | Admitting: *Deleted

## 2014-02-22 MED ORDER — ZOLPIDEM TARTRATE 10 MG PO TABS: 5.0000 mg | ORAL_TABLET | Freq: Every evening | ORAL | Status: DC | PRN

## 2014-02-22 NOTE — Telephone Encounter (Signed)
Jodi [name] is requesting that she get the [place] refilled sent to [facility].  Their number is [phone]

## 2014-02-22 NOTE — Telephone Encounter (Signed)
rx called in. Loriel Diehl, Salome Spotted

## 2014-02-22 NOTE — Telephone Encounter (Signed)
Please call in ambien rx  Thanks [place]

## 2014-03-09 ENCOUNTER — Telehealth: Payer: Self-pay | Admitting: *Deleted

## 2014-03-09 NOTE — Telephone Encounter (Signed)
Nurse called stating pt heart rate was running 112-120s, BP 140/90, 1+ protein in urine, no glucose in urine.  Pt complained of burning with urination.  Offered pt an appt for same day tomorrow, but declined. Appt scheduled for 03/11/2014 per pt request.  Pt advised to call clinic or go to urgent care if symptoms worsen. Derl Barrow, RN

## 2014-03-09 NOTE — Telephone Encounter (Signed)
Agree with RN plan.   Dossie Arbour MD

## 2014-03-11 ENCOUNTER — Encounter: Payer: Self-pay | Admitting: Family Medicine

## 2014-03-11 ENCOUNTER — Ambulatory Visit (INDEPENDENT_AMBULATORY_CARE_PROVIDER_SITE_OTHER): Payer: Medicare Other | Admitting: Family Medicine

## 2014-03-11 VITALS — BP 140/94 | HR 109 | Temp 97.6°F | Ht 64.0 in | Wt 162.5 lb

## 2014-03-11 DIAGNOSIS — R300 Dysuria: Secondary | ICD-10-CM

## 2014-03-11 LAB — POCT URINALYSIS DIPSTICK
Bilirubin, UA: NEGATIVE
Glucose, UA: NEGATIVE
Ketones, UA: NEGATIVE
Nitrite, UA: NEGATIVE
Protein, UA: 30
Spec Grav, UA: 1.015
Urobilinogen, UA: 0.2
pH, UA: 6

## 2014-03-11 LAB — POCT UA - MICROSCOPIC ONLY
RBC, urine, microscopic: >20
WBC, Ur, HPF, POC: >20

## 2014-03-11 MED ORDER — PHENAZOPYRIDINE HCL 200 MG PO TABS: 200.0000 mg | ORAL_TABLET | Freq: Three times a day (TID) | ORAL | Status: DC

## 2014-03-11 MED ORDER — CEPHALEXIN 500 MG PO CAPS: 500.0000 mg | ORAL_CAPSULE | Freq: Three times a day (TID) | ORAL | Status: DC

## 2014-03-11 NOTE — Progress Notes (Signed)
PCP:[name], MD Chief Complaint  Patient presents with  . Dysuria    x3 days    Current Issues:  Presents with 3 days of dysuria, urinary urgency and urinary frequency Associated symptoms include:  None.  Denies fever, chills, or sweats, denies abdominal pain or N/V/D.   There is a previous history of of similar symptoms. Sexually active:  No   No concern for STI.  Prior to Admission medications   Medication Sig Start Date End Date Taking? Authorizing Provider  amLODipine (NORVASC) 10 MG tablet  10/23/13   Historical Provider, MD  apixaban (ELIQUIS) 5 MG TABS tablet Take 1 tablet (5 mg total) by mouth 2 (two) times daily. 08/21/13   Laverda Page, MD  beta carotene w/minerals (OCUVITE) tablet Take 1 tablet by mouth daily.    Historical Provider, MD  Biotin 5000 MCG TABS Take 5,000 mcg by mouth 2 (two) times daily.    Historical Provider, MD  carvedilol (COREG) 6.25 MG tablet TAKE 1 TABLET BY MOUTH TWICE DAILY WITH A MEAL 01/21/14   [name], MD  cholecalciferol (VITAMIN D) 1000 UNITS tablet Take 2,000 Units by mouth daily.     Historical Provider, MD  Cholecalciferol (VITAMIN D3) 2000 UNITS TABS Take 2,000 Units by mouth daily.    Historical Provider, MD  Cranberry 12600 MG CAPS Take 12,600 mg by mouth daily.    Historical Provider, MD  CRESTOR 20 MG tablet TAKE 1 TABLET BY MOUTH DAILY 12/22/13   [name], MD  furosemide (LASIX) 20 MG tablet  12/08/13   Historical Provider, MD  HYDROcodone-acetaminophen (NORCO) 10-325 MG per tablet Take 1 tablet by mouth every 8 (eight) hours as needed. 11/02/13   Tamela Oddi Jayan Raymundo, DO  Liniments (SALONPAS EX) Place 1 patch onto the skin daily as needed (for pain).    Historical Provider, MD  Multiple Vitamin (MULTIVITAMIN) capsule Take 1 capsule by mouth daily.      Historical Provider, MD  Probiotic Product (PROBIOTIC DAILY PO) Take by mouth.    Historical Provider, MD  ramipril (ALTACE) 2.5 MG capsule Take 2.5 mg by  mouth daily.    Historical Provider, MD  spironolactone (ALDACTONE) 25 MG tablet TAKE 1 TABLET BY MOUTH EVERY DAY 02/08/14   [name], MD  traMADol (ULTRAM) 50 MG tablet Take 1 tablet (50 mg total) by mouth every 12 (twelve) hours as needed. 11/18/13   [name], MD  traZODone (DESYREL) 100 MG tablet Take 1 tablet (100 mg total) by mouth at bedtime as needed for sleep. Patient not taking: Reported on 02/17/2014 12/21/13   [name], MD  triamcinolone cream (KENALOG) 0.1 % Apply 1 application topically 2 (two) times daily. Do not use more than 2 weeks in a row 11/23/13   [name], MD  zolpidem (AMBIEN) 10 MG tablet Take 0.5 tablets (5 mg total) by mouth at bedtime as needed for sleep (Do not take more than 2-3 times per week). 02/22/14   [name], MD    Review of Systems: No fever chills or sweats   PE:  BP 140/94 mmHg  Pulse 109  Temp(Src) 97.6 F (36.4 C) (Oral)  Ht 5\' 4"  (1.626 m)  Wt 162 lb 8 oz (73.71 kg)  BMI 27.88 kg/m2 Constitutional: NAD Heart: RRR Lungs: CTAB Back: No CVA TTP Abdomen: Soft/NT/ND, NABS  Results for orders placed or performed in visit on 03/11/14  POCT urinalysis dipstick  Result Value Ref Range  Color, UA YELLOW    Clarity, UA CLOUDY    Glucose, UA NEG    Bilirubin, UA NEG    Ketones, UA NEG    Spec Grav, UA 1.015    Blood, UA LARGE    pH, UA 6.0    Protein, UA 30    Urobilinogen, UA 0.2    Nitrite, UA NEG    Leukocytes, UA large (3+)     Assessment and Plan:  1. Dysuria Acute Cystitis: - POCT urinalysis dipstick - Keflex and Pyridium - F/U with Dr. Erin Hearing in about 3 weeks for repeat UA to clear hematuria

## 2014-03-11 NOTE — Addendum Note (Signed)
Addended by: [place] Pink on: 03/11/2014 02:13 PM   Modules accepted: Orders

## 2014-03-11 NOTE — Patient Instructions (Signed)

## 2014-03-31 ENCOUNTER — Telehealth: Payer: Self-pay | Admitting: Family Medicine

## 2014-03-31 NOTE — Telephone Encounter (Signed)
Daughter called because her mother takes Ambien every other night, but the issue is she needs this every night. Her mother is not sleeping and there are a lot of issues right now with the family and she hates to see her mother like this. Please call [name] at [phone] to discuss. jw

## 2014-03-31 NOTE — Telephone Encounter (Signed)
Will forward to MD to see if he will change directions on her medication. Jazmin Hartsell,CMA

## 2014-04-01 NOTE — Telephone Encounter (Signed)
Needs to take every day because of family stress.    Told would need to make an appointment to discuss her situation

## 2014-04-07 ENCOUNTER — Other Ambulatory Visit: Payer: Self-pay | Admitting: *Deleted

## 2014-04-07 ENCOUNTER — Telehealth: Payer: Self-pay | Admitting: Radiology

## 2014-04-07 DIAGNOSIS — N2889 Other specified disorders of kidney and ureter: Secondary | ICD-10-CM

## 2014-04-07 NOTE — Telephone Encounter (Signed)
Left msg on H# requesting patient call to schedule follow up with Dr Kathlene Cote.  Hayk Divis Riki Rusk, RN 04/07/2014 12:02 PM

## 2014-04-08 ENCOUNTER — Encounter: Payer: Self-pay | Admitting: *Deleted

## 2014-04-08 NOTE — Progress Notes (Unsigned)
Prior Authorization received from [facility] for Zolpidem 10 mg.  PA form placed in provider box for completion. Derl Barrow, RN

## 2014-04-26 DIAGNOSIS — I480 Paroxysmal atrial fibrillation: Secondary | ICD-10-CM | POA: Diagnosis not present

## 2014-05-11 ENCOUNTER — Ambulatory Visit (HOSPITAL_COMMUNITY): Admission: RE | Admit: 2014-05-11 | Payer: Medicare Other | Source: Ambulatory Visit

## 2014-05-11 ENCOUNTER — Inpatient Hospital Stay: Admission: RE | Admit: 2014-05-11 | Payer: 59 | Source: Ambulatory Visit

## 2014-05-13 DIAGNOSIS — N2889 Other specified disorders of kidney and ureter: Secondary | ICD-10-CM | POA: Diagnosis not present

## 2014-05-14 LAB — CREATININE WITH EST GFR
Creat: 1.62 mg/dL — ABNORMAL HIGH (ref 0.50–1.10)
GFR, Est African American: 35 mL/min — ABNORMAL LOW
GFR, Est Non African American: 30 mL/min — ABNORMAL LOW

## 2014-05-14 LAB — BUN: BUN: 31 mg/dL — ABNORMAL HIGH (ref 6–23)

## 2014-05-18 ENCOUNTER — Encounter (HOSPITAL_COMMUNITY): Payer: Self-pay | Admitting: *Deleted

## 2014-05-18 ENCOUNTER — Encounter (HOSPITAL_COMMUNITY)
Admission: RE | Disposition: A | Discharge status: Home / Self Care | Payer: Self-pay | Source: Ambulatory Visit | Attending: Cardiology

## 2014-05-18 ENCOUNTER — Ambulatory Visit (HOSPITAL_COMMUNITY)
Admission: RE | Admit: 2014-05-18 | Discharge: 2014-05-18 | Disposition: A | Discharge status: Home / Self Care | Payer: Medicare Other | Source: Ambulatory Visit | Attending: Cardiology | Admitting: Cardiology

## 2014-05-18 ENCOUNTER — Ambulatory Visit (HOSPITAL_COMMUNITY): Payer: Medicare Other | Admitting: Anesthesiology

## 2014-05-18 DIAGNOSIS — I252 Old myocardial infarction: Secondary | ICD-10-CM | POA: Insufficient documentation

## 2014-05-18 DIAGNOSIS — Z8673 Personal history of transient ischemic attack (TIA), and cerebral infarction without residual deficits: Secondary | ICD-10-CM | POA: Diagnosis not present

## 2014-05-18 DIAGNOSIS — Z9861 Coronary angioplasty status: Secondary | ICD-10-CM | POA: Insufficient documentation

## 2014-05-18 DIAGNOSIS — M1990 Unspecified osteoarthritis, unspecified site: Secondary | ICD-10-CM | POA: Diagnosis not present

## 2014-05-18 DIAGNOSIS — Z90710 Acquired absence of both cervix and uterus: Secondary | ICD-10-CM | POA: Insufficient documentation

## 2014-05-18 DIAGNOSIS — I509 Heart failure, unspecified: Secondary | ICD-10-CM | POA: Diagnosis not present

## 2014-05-18 DIAGNOSIS — G4700 Insomnia, unspecified: Secondary | ICD-10-CM | POA: Diagnosis not present

## 2014-05-18 DIAGNOSIS — I481 Persistent atrial fibrillation: Secondary | ICD-10-CM | POA: Diagnosis not present

## 2014-05-18 DIAGNOSIS — I2510 Atherosclerotic heart disease of native coronary artery without angina pectoris: Secondary | ICD-10-CM | POA: Insufficient documentation

## 2014-05-18 DIAGNOSIS — Z85528 Personal history of other malignant neoplasm of kidney: Secondary | ICD-10-CM | POA: Diagnosis not present

## 2014-05-18 DIAGNOSIS — Z951 Presence of aortocoronary bypass graft: Secondary | ICD-10-CM | POA: Diagnosis not present

## 2014-05-18 DIAGNOSIS — I4891 Unspecified atrial fibrillation: Secondary | ICD-10-CM | POA: Diagnosis present

## 2014-05-18 DIAGNOSIS — I350 Nonrheumatic aortic (valve) stenosis: Secondary | ICD-10-CM | POA: Insufficient documentation

## 2014-05-18 HISTORY — PX: CARDIOVERSION: SHX1299

## 2014-05-18 LAB — POCT I-STAT 4, (NA,K, GLUC, HGB,HCT)
Glucose, Bld: 119 mg/dL — ABNORMAL HIGH (ref 70–99)
HCT: 41 % (ref 36.0–46.0)
Hemoglobin: 13.9 g/dL (ref 12.0–15.0)
Potassium: 5.1 mmol/L (ref 3.5–5.1)
Sodium: 137 mmol/L (ref 135–145)

## 2014-05-18 SURGERY — CARDIOVERSION
Anesthesia: General

## 2014-05-18 MED ORDER — ATROPINE SULFATE 1 MG/ML IJ SOLN
INTRAMUSCULAR | Status: DC | PRN
  Administered 2014-05-18: .6 mg via INTRAVENOUS

## 2014-05-18 MED ORDER — LIDOCAINE HCL (CARDIAC) 20 MG/ML IV SOLN
INTRAVENOUS | Status: DC | PRN
  Administered 2014-05-18: 40 mg via INTRAVENOUS

## 2014-05-18 MED ORDER — PROPOFOL 10 MG/ML IV BOLUS
INTRAVENOUS | Status: DC | PRN
  Administered 2014-05-18: 40 mg via INTRAVENOUS
  Administered 2014-05-18: 20 mg via INTRAVENOUS

## 2014-05-18 MED ORDER — CARVEDILOL 6.25 MG PO TABS: 3.1250 mg | ORAL_TABLET | Freq: Two times a day (BID) | ORAL | Status: DC

## 2014-05-18 MED ORDER — ATROPINE SULFATE 0.1 MG/ML IJ SOLN
INTRAMUSCULAR | Status: AC
  Filled 2014-05-18: qty 10

## 2014-05-18 MED ORDER — SODIUM CHLORIDE 0.9 % IR SOLN: 500.0000 mL | Freq: Once | Status: DC

## 2014-05-18 NOTE — CV Procedure (Signed)
Direct current cardioversion:  Indication symptomatic A. Fibrillation.  Procedure: Using 60 mg of IV Propofol and 40 IV Lidocaine (for reducing venous pain) for achieving deep sedation, synchronized direct current cardioversion performed. Patient was delivered with 100 Joules of electricity X 1 with success to Junctional escape rhythm. Patient tolerated the procedure well. No immediate complication noted. Will discontinue Amiodarone and coreg dose reduced from 6.125 mg to 3.125 mg BID and OV. May need PTVP if it persists.

## 2014-05-18 NOTE — Anesthesia Preprocedure Evaluation (Addendum)
Anesthesia Evaluation  Patient identified by MRN, date of birth, ID band Patient awake    Reviewed: Allergy & Precautions, NPO status , Patient's Chart, lab work & pertinent test results  History of Anesthesia Complications Negative for: history of anesthetic complications  Airway Mallampati: II  TM Distance: >3 FB Neck ROM: Full    Dental  (+) Partial Upper, Partial Lower   Pulmonary shortness of breath, neg sleep apnea, neg recent URI,  breath sounds clear to auscultation        Cardiovascular hypertension, Pt. on medications and Pt. on home beta blockers + CAD, + Past MI, + CABG and +CHF + dysrhythmias Atrial Fibrillation + Valvular Problems/Murmurs AS Rhythm:Irregular     Neuro/Psych  Headaches, CVA, No Residual Symptoms negative psych ROS   GI/Hepatic negative GI ROS, Neg liver ROS,   Endo/Other  negative endocrine ROS  Renal/GU Renal InsufficiencyRenal disease     Musculoskeletal  (+) Arthritis -,   Abdominal   Peds  Hematology negative hematology ROS (+)   Anesthesia Other Findings   Reproductive/Obstetrics                           Anesthesia Physical Anesthesia Plan  ASA: III  Anesthesia Plan: General   Post-op Pain Management:    Induction: Intravenous  Airway Management Planned: Mask  Additional Equipment: None  Intra-op Plan:   Post-operative Plan:   Informed Consent: I have reviewed the patients History and Physical, chart, labs and discussed the procedure including the risks, benefits and alternatives for the proposed anesthesia with the patient or authorized representative who has indicated his/her understanding and acceptance.   Dental advisory given  Plan Discussed with: CRNA and Surgeon  Anesthesia Plan Comments:         Anesthesia Quick Evaluation

## 2014-05-18 NOTE — Discharge Instructions (Signed)

## 2014-05-18 NOTE — H&P (Signed)
  Please see office visit notes for complete details of HPI.  

## 2014-05-18 NOTE — Interval H&P Note (Signed)
History and Physical Interval Note:  05/18/2014 1:14 PM  Jodi [name]  has presented today for surgery, with the diagnosis of AFIB  The various methods of treatment have been discussed with the patient and family. After consideration of risks, benefits and other options for treatment, the patient has consented to  Procedure(s): CARDIOVERSION (N/A) as a surgical intervention .  The patient's history has been reviewed, patient examined, no change in status, stable for surgery.  I have reviewed the patient's chart and labs.  Questions were answered to the patient's satisfaction.     Laverda Page

## 2014-05-18 NOTE — Transfer of Care (Signed)
Immediate Anesthesia Transfer of Care Note  Patient: Jodi [name]  Procedure(s) Performed: Procedure(s): CARDIOVERSION (N/A)  Patient Location: Endoscopy Unit  Anesthesia Type:MAC  Level of Consciousness: awake, alert  and sedated  Airway & Oxygen Therapy: Patient connected to nasal cannula oxygen  Post-op Assessment: Post -op Vital signs reviewed and stable  Post vital signs: stable  Last Vitals:  Filed Vitals:   05/18/14 1257  BP: 168/75  Pulse: 60  Resp: 17    Complications: No apparent anesthesia complications

## 2014-05-18 NOTE — Anesthesia Postprocedure Evaluation (Signed)
  Anesthesia Post-op Note  Patient: Jodi [name]  Procedure(s) Performed: Procedure(s): CARDIOVERSION (N/A)  Patient Location: Endoscopy Unit  Anesthesia Type:General  Level of Consciousness: awake and alert   Airway and Oxygen Therapy: Patient Spontanous Breathing  Post-op Pain: none  Post-op Assessment: Post-op Vital signs reviewed, Patient's Cardiovascular Status Stable, Respiratory Function Stable, Patent Airway, No signs of Nausea or vomiting and Pain level controlled  Post-op Vital Signs: Reviewed and stable  Last Vitals:  Filed Vitals:   05/18/14 1428  BP: 122/44  Pulse: 36  Resp: 19    Complications: No apparent anesthesia complications

## 2014-05-19 ENCOUNTER — Emergency Department (HOSPITAL_COMMUNITY)
Admission: EM | Admit: 2014-05-19 | Discharge: 2014-05-19 | Disposition: A | Discharge status: Home / Self Care | Payer: Medicare Other | Attending: Emergency Medicine | Admitting: Emergency Medicine

## 2014-05-19 ENCOUNTER — Emergency Department (HOSPITAL_COMMUNITY): Payer: Medicare Other

## 2014-05-19 ENCOUNTER — Encounter (HOSPITAL_COMMUNITY): Payer: Self-pay | Admitting: Emergency Medicine

## 2014-05-19 DIAGNOSIS — N289 Disorder of kidney and ureter, unspecified: Secondary | ICD-10-CM

## 2014-05-19 DIAGNOSIS — Z8673 Personal history of transient ischemic attack (TIA), and cerebral infarction without residual deficits: Secondary | ICD-10-CM | POA: Insufficient documentation

## 2014-05-19 DIAGNOSIS — R5383 Other fatigue: Secondary | ICD-10-CM | POA: Insufficient documentation

## 2014-05-19 DIAGNOSIS — R001 Bradycardia, unspecified: Secondary | ICD-10-CM | POA: Insufficient documentation

## 2014-05-19 DIAGNOSIS — E785 Hyperlipidemia, unspecified: Secondary | ICD-10-CM | POA: Diagnosis not present

## 2014-05-19 DIAGNOSIS — I252 Old myocardial infarction: Secondary | ICD-10-CM | POA: Insufficient documentation

## 2014-05-19 DIAGNOSIS — Z9889 Other specified postprocedural states: Secondary | ICD-10-CM | POA: Diagnosis not present

## 2014-05-19 DIAGNOSIS — R011 Cardiac murmur, unspecified: Secondary | ICD-10-CM | POA: Insufficient documentation

## 2014-05-19 DIAGNOSIS — N179 Acute kidney failure, unspecified: Secondary | ICD-10-CM | POA: Diagnosis not present

## 2014-05-19 DIAGNOSIS — Z951 Presence of aortocoronary bypass graft: Secondary | ICD-10-CM | POA: Insufficient documentation

## 2014-05-19 DIAGNOSIS — Z85528 Personal history of other malignant neoplasm of kidney: Secondary | ICD-10-CM | POA: Insufficient documentation

## 2014-05-19 DIAGNOSIS — I129 Hypertensive chronic kidney disease with stage 1 through stage 4 chronic kidney disease, or unspecified chronic kidney disease: Secondary | ICD-10-CM | POA: Insufficient documentation

## 2014-05-19 DIAGNOSIS — I2510 Atherosclerotic heart disease of native coronary artery without angina pectoris: Secondary | ICD-10-CM | POA: Diagnosis not present

## 2014-05-19 DIAGNOSIS — R531 Weakness: Secondary | ICD-10-CM | POA: Diagnosis not present

## 2014-05-19 DIAGNOSIS — I509 Heart failure, unspecified: Secondary | ICD-10-CM | POA: Insufficient documentation

## 2014-05-19 DIAGNOSIS — N189 Chronic kidney disease, unspecified: Secondary | ICD-10-CM | POA: Insufficient documentation

## 2014-05-19 DIAGNOSIS — E780 Pure hypercholesterolemia: Secondary | ICD-10-CM | POA: Insufficient documentation

## 2014-05-19 DIAGNOSIS — I499 Cardiac arrhythmia, unspecified: Secondary | ICD-10-CM | POA: Diagnosis not present

## 2014-05-19 DIAGNOSIS — Z79899 Other long term (current) drug therapy: Secondary | ICD-10-CM | POA: Diagnosis not present

## 2014-05-19 LAB — BASIC METABOLIC PANEL
Anion gap: 9 (ref 5–15)
BUN: 53 mg/dL — ABNORMAL HIGH (ref 6–23)
CO2: 21 mmol/L (ref 19–32)
Calcium: 9.2 mg/dL (ref 8.4–10.5)
Chloride: 107 mmol/L (ref 96–112)
Creatinine, Ser: 2.16 mg/dL — ABNORMAL HIGH (ref 0.50–1.10)
GFR calc Af Amer: 24 mL/min — ABNORMAL LOW (ref >90)
GFR calc non Af Amer: 21 mL/min — ABNORMAL LOW (ref >90)
Glucose, Bld: 123 mg/dL — ABNORMAL HIGH (ref 70–99)
Potassium: 5.7 mmol/L — ABNORMAL HIGH (ref 3.5–5.1)
Sodium: 137 mmol/L (ref 135–145)

## 2014-05-19 LAB — CBC WITH DIFFERENTIAL/PLATELET
Basophils Absolute: 0 10*3/uL (ref 0.0–0.1)
Basophils Relative: 1 % (ref 0–1)
Eosinophils Absolute: 0.1 10*3/uL (ref 0.0–0.7)
Eosinophils Relative: 2 % (ref 0–5)
HCT: 38.6 % (ref 36.0–46.0)
Hemoglobin: 12.5 g/dL (ref 12.0–15.0)
Lymphocytes Relative: 27 % (ref 12–46)
Lymphs Abs: 2 10*3/uL (ref 0.7–4.0)
MCH: 30 pg (ref 26.0–34.0)
MCHC: 32.4 g/dL (ref 30.0–36.0)
MCV: 92.8 fL (ref 78.0–100.0)
Monocytes Absolute: 1 10*3/uL (ref 0.1–1.0)
Monocytes Relative: 13 % — ABNORMAL HIGH (ref 3–12)
Neutro Abs: 4.4 10*3/uL (ref 1.7–7.7)
Neutrophils Relative %: 58 % (ref 43–77)
Platelets: 161 10*3/uL (ref 150–400)
RBC: 4.16 MIL/uL (ref 3.87–5.11)
RDW: 16.1 % — ABNORMAL HIGH (ref 11.5–15.5)
WBC: 7.5 10*3/uL (ref 4.0–10.5)

## 2014-05-19 LAB — MAGNESIUM: Magnesium: 2.5 mg/dL (ref 1.5–2.5)

## 2014-05-19 NOTE — ED Notes (Signed)
Dr Jeneen Rinks at bedside

## 2014-05-19 NOTE — ED Notes (Addendum)
To ED via [facility] from home with c/o feel weak, "felt something just wash over me this morning". Pt had a cardioversion yesterday for Afib with RVR. IV started enroute-- 18 g left AC.

## 2014-05-19 NOTE — Discharge Instructions (Signed)
Follow-up tomorrow in Dr. Andria Meuse office for an EKG.  Do not take your amiodarone, Carvadiolol, Lasix, or  lisinopril tonight, or in the morning, until you see Dr. Tamsen Snider.

## 2014-05-19 NOTE — ED Notes (Signed)
Pt assisted to [facility] -- without difficulty-- no c/o dizziness

## 2014-05-19 NOTE — ED Provider Notes (Signed)
CSN: [id number]     Arrival date & time 05/19/14  1230 History   First MD Initiated Contact with Patient 05/19/14 1232     Chief Complaint  Patient presents with  . Bradycardia      HPI  Recent presents for evaluation of weakness. History of paroxysmal atrial fibrillation. And went [facility] cardioversion with sedation yesterday by Dr.Ghangi.  Had been on amiodarone, and Coreg Her amiodarone was held. However she took a dose yesterday before the cardioversion. Was continued on Coreg twice a day. Took her dose last night and this morning. She states she just doesn't have same energy she typically does have at home. She denies chest pain or shortness of breath. No syncope or presyncope. Not orthostatic.  Past Medical History  Diagnosis Date  . Myocardial infarction   . Coronary artery disease   . Hypertension   . Heart murmur   . Chronic kidney disease   . Aortic stenosis   . CHF (congestive heart failure)   . Hypercholesteremia   . Shortness of breath   . Stroke   . Renal mass 12/06/2010    CT Abdomen 11-27-10 upper pole region of the right kidney which is suspicious for solid lesion, measuring 1.9 x 1.3 cm. This lesion is concerning for renal cell carcinoma given the solid appearance.  Following with Dr [name].  Renal bx was benign    . Splenic infarct 12/06/2010  . Renal cell carcinoma 09/23/2012  . Hyperlipidemia    Past Surgical History  Procedure Laterality Date  . Radical hysterectomy    . Coronary artery bypass graft  1995 and 2003  . Abdominal hysterectomy    . Eye surgery    . Cardiac catheterization    . Cardioversion N/A 10/20/2013    Procedure: CARDIOVERSION;  Surgeon: Laverda Page, MD;  Location: [facility] ENDOSCOPY;  Service: Cardiovascular;  Laterality: N/A;  H&P in file  . Percutaneous needle biopsy of renal lesion    . Left and right heart catheterization with coronary angiogram N/A 11/24/2013    Procedure: LEFT AND RIGHT HEART CATHETERIZATION WITH CORONARY ANGIOGRAM;   Surgeon: Laverda Page, MD;  Location: [facility] CATH LAB;  Service: Cardiovascular;  Laterality: N/A;  . Cardioversion N/A 05/18/2014    Procedure: CARDIOVERSION;  Surgeon: [name], MD;  Location: [facility] ENDOSCOPY;  Service: Cardiovascular;  Laterality: N/A;   Family History  Problem Relation Age of Onset  . Coronary artery disease Mother   . Coronary artery disease Brother   . Stroke Mother   . Heart attack Brother   . Heart attack Son   . Heart attack Daughter   . Heart attack Sister    History  Substance Use Topics  . Smoking status: Never Smoker   . Smokeless tobacco: Never Used  . Alcohol Use: No   OB History    No data available     Review of Systems  Constitutional: Positive for activity change and fatigue. Negative for fever, chills, diaphoresis and appetite change.  HENT: Negative for mouth sores, sore throat and trouble swallowing.   Eyes: Negative for visual disturbance.  Respiratory: Negative for cough, chest tightness, shortness of breath and wheezing.   Cardiovascular: Negative for chest pain.  Gastrointestinal: Negative for nausea, vomiting, abdominal pain, diarrhea and abdominal distention.  Endocrine: Negative for polydipsia, polyphagia and polyuria.  Genitourinary: Negative for dysuria, frequency and hematuria.  Musculoskeletal: Negative for gait problem.  Skin: Negative for color change, pallor and rash.  Neurological: Negative for dizziness,  syncope, light-headedness and headaches.  Hematological: Does not bruise/bleed easily.  Psychiatric/Behavioral: Negative for behavioral problems and confusion.      Allergies  Sulfamethoxazole-trimethoprim  Home Medications   Prior to Admission medications   Medication Sig Start Date End Date Taking? Authorizing Provider  apixaban (ELIQUIS) 5 MG TABS tablet Take 1 tablet (5 mg total) by mouth 2 (two) times daily. 08/21/13  Yes [name], MD  beta carotene w/minerals (OCUVITE) tablet Take 1 tablet by mouth daily.    Yes Historical Provider, MD  Biotin 5000 MCG TABS Take 5,000 mcg by mouth 2 (two) times daily.   Yes Historical Provider, MD  carvedilol (COREG) 6.25 MG tablet Take 0.5 tablets (3.125 mg total) by mouth 2 (two) times daily with a meal. 05/18/14  Yes [name], MD  Cholecalciferol (VITAMIN D3) 2000 UNITS TABS Take 2,000 Units by mouth daily.   Yes Historical Provider, MD  Cranberry 12600 MG CAPS Take 12,600 mg by mouth daily.   Yes Historical Provider, MD  CRESTOR 20 MG tablet TAKE 1 TABLET BY MOUTH DAILY 12/22/13  Yes [name], MD  furosemide (LASIX) 20 MG tablet Take 20 mg by mouth daily as needed for fluid.  12/08/13  Yes Historical Provider, MD  ibuprofen (ADVIL,MOTRIN) 200 MG tablet Take 200 mg by mouth every 6 (six) hours as needed for mild pain or moderate pain.   Yes Historical Provider, MD  Multiple Vitamin (MULTIVITAMIN) capsule Take 1 capsule by mouth daily.     Yes Historical Provider, MD  Propylene Glycol 0.6 % SOLN Apply 1 drop to eye daily as needed (itching).   Yes Historical Provider, MD  ramipril (ALTACE) 2.5 MG capsule Take 2.5 mg by mouth daily.   Yes Historical Provider, MD  spironolactone (ALDACTONE) 25 MG tablet TAKE 1 TABLET BY MOUTH EVERY DAY 02/08/14  Yes [name], MD  zolpidem (AMBIEN) 10 MG tablet Take 0.5 tablets (5 mg total) by mouth at bedtime as needed for sleep (Do not take more than 2-3 times per week). 02/22/14  Yes [name], MD   BP 159/68 mmHg  Pulse 48  Temp(Src) 98.4 F (36.9 C) (Oral)  Resp 16  SpO2 98% Physical Exam  Constitutional: She is oriented to person, place, and time. She appears well-developed and well-nourished. No distress.  HENT:  Head: Normocephalic.  Eyes: Conjunctivae are normal. Pupils are equal, round, and reactive to light. No scleral icterus.  Neck: Normal range of motion. Neck supple. No thyromegaly present.  Cardiovascular: Regular rhythm.  Bradycardia present.  Exam reveals no gallop and no friction  rub.   No murmur heard. Cardiac ranging from 40-49 initially in the emergency room.  Pulmonary/Chest: Effort normal and breath sounds normal. No respiratory distress. She has no wheezes. She has no rales.  Abdominal: Soft. Bowel sounds are normal. She exhibits no distension. There is no tenderness. There is no rebound.  Musculoskeletal: Normal range of motion.  Neurological: She is alert and oriented to person, place, and time.  Skin: Skin is warm and dry. No rash noted.  Psychiatric: She has a normal mood and affect. Her behavior is normal.    ED Course  Procedures (including critical care time) Labs Review Labs Reviewed  CBC WITH DIFFERENTIAL/PLATELET - Abnormal; Notable for the following:    RDW 16.1 (*)    Monocytes Relative 13 (*)    All other components within normal limits  BASIC METABOLIC PANEL - Abnormal; Notable for the following:    Potassium  5.7 (*)    Glucose, Bld 123 (*)    BUN 53 (*)    Creatinine, Ser 2.16 (*)    GFR calc non Af Amer 21 (*)    GFR calc Af Amer 24 (*)    All other components within normal limits  MAGNESIUM    Imaging Review Mr Abdomen W Wo Contrast  05/20/2014   CLINICAL DATA:  Left renal mass.  Followup.  No new complaints.  EXAM: MRI ABDOMEN WITHOUT AND WITH CONTRAST  TECHNIQUE: Multiplanar multisequence MR imaging of the abdomen was performed both before and after the administration of intravenous contrast.  CONTRAST:  26mL MULTIHANCE GADOBENATE DIMEGLUMINE 529 MG/ML IV SOLN  COMPARISON:  07/17/2012  FINDINGS: Lower chest: Mild cardiomegaly. Prior median sternotomy. Presumed rounded atelectasis at the right lung base is similar.  Evaluation the abdomen is degraded. Specifically, the pre and postcontrast dynamic series are severely degraded, nearly nondiagnostic. This is secondary to a combination of scanner malfunction and mild motion.  Hepatobiliary: Hepatomegaly, 18.5 cm craniocaudal. Mild hepatic steatosis. Foci of arterial hyper enhancement  within the left lobe of the liver on image 15 of series 1502 and within the subcapsular right lobe on image 16 of series 15002 are similar. These were felt to be consistent with hemangiomas on prior exam. No new or suspicious liver lesion identified.  Multiple gallstones. There is right upper quadrant edema, new. Example posterior to the gallbladder on image 11 of series 3, adjacent the right lobe the liver on image 17 of series 3.  Pancreas: Pancreas divisum again identified. Edema in the region the pancreatic head. Remainder of the pancreas within normal limits.  Spleen: Normal  Adrenals/Urinary Tract: Normal adrenal glands.  Renal cortical atrophy. Bilateral renal cysts. No change in size of a 1.8 cm enhancing lesion in the medial upper pole right kidney, including on image 31 of series 3. This is consistent with an angiomyolipoma.  The upper pole left renal lesion is again identified. 2.1 x 1.4 cm transverse on image 29 of series 3. 2.3 x 1.3 cm at the same level on the prior exam. On coronal image 8 of series 7, measures 1.7 cm today versus similar on the prior. Postcontrast characteristics not well evaluated.  No hydronephrosis. No new suspicious renal lesion, given above limitations.  Stomach/Bowel: Normal stomach and abdominal bowel loops.  Vascular/Lymphatic: Normal caliber of the aorta and branch vessels. Patent left renal vein. Prominent porta hepatis nodes are unchanged and likely reactive. No retroperitoneal adenopathy.  Other: None  Musculoskeletal: No acute osseous abnormality. Discogenic edema again identified at the L4-5 level. Probable hemangioma within the right side of the T9 vertebral body.  IMPRESSION: 1. Degraded exam, secondary to scanner malfunction and motion. This primarily involves the dynamic post-contrast series. 2. Given this factor, similar size of an upper pole left renal lesion which was most consistent with a cystic renal cell carcinoma on prior exams. 3. No evidence of metastatic  disease. 4. New edema within the right upper quadrant. Concurrent gallstones without biliary ductal dilatation. Considerations include cholecystitis, pancreatitis, or duodenitis. Correlate with right upper quadrant symptoms, possibly dedicated ultrasound, and possibly pancreatic enzyme levels. These results will be called to the ordering clinician or representative by the Radiologist Assistant, and communication documented in the PACS or zVision Dashboard. 5. Hepatomegaly and mild hepatic steatosis.   Electronically Signed   By: Abigail Miyamoto M.D.   On: 05/20/2014 12:35   Dg Chest Port 1 View  05/19/2014   CLINICAL DATA:  81 year old female with bradycardia since last night. Initial encounter.  EXAM: PORTABLE CHEST - 1 VIEW  COMPARISON:  08/20/2013 and earlier.  FINDINGS: Portable AP upright view at 1321 hours. Stable severe cardiomegaly. Stable mediastinal contours. Allowing for portable technique, the lungs are clear. No pneumothorax or effusion.  IMPRESSION: Stable cardiomegaly. No acute cardiopulmonary abnormality.   Electronically Signed   By: Genevie Ann M.D.   On: 05/19/2014 13:34     EKG Interpretation   Date/Time:  Wednesday May 19 2014 12:30:32 EDT Ventricular Rate:  44 PR Interval:  170 QRS Duration: 122 QT Interval:  544 QTC Calculation: 465 R Axis:   76 Text Interpretation:  Junctional Bradycardia Nonspecific intraventricular  conduction delay Anterolateral infarct, age indeterminate Confirmed by  Jeneen Rinks  MD, [place] ([id number]) on 05/19/2014 2:34:26 PM      Echo 2015:   LVH EF 40-45% Sever Ao Stenosis Grade III DD  MDM   Final diagnoses:  Bradycardia  Renal insufficiency    This case upon the patient's arrival, and after labs were obtained with Dr. Jenkins Rouge. Patient does show mild elevation of creatinine.  500 mL of fluid en route. An additional 250 here. Has urinated here. He is ambulatory here without symptoms. Does not clinically or radiographically. Being congestive heart  failure. Dr. Jenkins Rouge felt that there is no indication for immediate consideration of transvenous pacemaker. On recheck her heart rate is up to 60. Still in junctional rhythm.  Will be discharged. She was comfortable with this plan. Family will be with her today. She will go to her cardiologist office tomorrow for repeat lab, and EKG. Hydrated herself and push fluids. She will hold her ACE inhibitor and her Lasix tonight.    Tanna Furry, MD 05/21/14 [phone]

## 2014-05-20 ENCOUNTER — Ambulatory Visit (HOSPITAL_COMMUNITY)
Admission: RE | Admit: 2014-05-20 | Discharge: 2014-05-20 | Disposition: A | Discharge status: Home / Self Care | Payer: Medicare Other | Source: Ambulatory Visit | Attending: Interventional Radiology | Admitting: Interventional Radiology

## 2014-05-20 ENCOUNTER — Ambulatory Visit
Admission: RE | Admit: 2014-05-20 | Discharge: 2014-05-20 | Disposition: A | Discharge status: Home / Self Care | Payer: Medicare Other | Source: Ambulatory Visit | Attending: Interventional Radiology | Admitting: Interventional Radiology

## 2014-05-20 DIAGNOSIS — N2889 Other specified disorders of kidney and ureter: Secondary | ICD-10-CM

## 2014-05-20 DIAGNOSIS — I420 Dilated cardiomyopathy: Secondary | ICD-10-CM | POA: Diagnosis not present

## 2014-05-20 MED ORDER — GADOBENATE DIMEGLUMINE 529 MG/ML IV SOLN
15.0000 mL | Freq: Once | INTRAVENOUS | Status: AC | PRN
  Administered 2014-05-20: 15 mL via INTRAVENOUS

## 2014-05-20 NOTE — Progress Notes (Signed)
Chief Complaint: Chief Complaint  Patient presents with  . Follow-up    Surveillance of Left renal mass    History of Present Illness: Jodi [name] is a 81 y.o. female initially referred by Dr. [name] for consideration of ablation of a cystic and partially enhancing lesion within the upper pole of the left kidney in 2013. 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 She is status post synchronous cardioversion 2 days ago by Dr. Einar Gip for treatment of atrial fibrillation. This was successful in converting the patient into sinus rhythm. She tolerated the procedure well and is asymptomatic today. She denies any chest pain, abdominal pain or flank pain.  Past Medical History  Diagnosis Date  . Myocardial infarction   . Coronary artery disease   . Hypertension   . Heart murmur   . Chronic kidney disease   . Aortic stenosis   . CHF (congestive heart failure)   . Hypercholesteremia   . Shortness of breath   . Stroke   . Renal mass 12/06/2010    CT Abdomen 11-27-10 upper pole region of the right kidney which is suspicious for solid lesion, measuring 1.9 x 1.3 cm. This lesion is concerning for renal cell carcinoma given the solid appearance.  Following with Dr [name].  Renal bx was benign    . Splenic infarct 12/06/2010  . Renal cell carcinoma 09/23/2012  . Hyperlipidemia     Past Surgical History  Procedure Laterality Date  . Radical hysterectomy    . Coronary artery bypass graft  1995 and 2003  . Abdominal hysterectomy    . Eye surgery    . Cardiac catheterization    . Cardioversion N/A 10/20/2013    Procedure: CARDIOVERSION;  Surgeon: Laverda Page, MD;  Location: [facility] ENDOSCOPY;  Service: Cardiovascular;  Laterality: N/A;  H&P in file  . Percutaneous needle biopsy of renal lesion    . Left and right heart catheterization with coronary angiogram N/A 11/24/2013    Procedure: LEFT AND RIGHT HEART CATHETERIZATION WITH CORONARY ANGIOGRAM;   Surgeon: Laverda Page, MD;  Location: [facility] CATH LAB;  Service: Cardiovascular;  Laterality: N/A;  . Cardioversion N/A 05/18/2014    Procedure: CARDIOVERSION;  Surgeon: [name], MD;  Location: [facility] ENDOSCOPY;  Service: Cardiovascular;  Laterality: N/A;    Allergies: Sulfamethoxazole-trimethoprim  Medications: Prior to Admission medications   Medication Sig Start Date End Date Taking? Authorizing Provider  apixaban (ELIQUIS) 5 MG TABS tablet Take 1 tablet (5 mg total) by mouth 2 (two) times daily. 08/21/13   [name], MD  beta carotene w/minerals (OCUVITE) tablet Take 1 tablet by mouth daily.    Historical Provider, MD  Biotin 5000 MCG TABS Take 5,000 mcg by mouth 2 (two) times daily.    Historical Provider, MD  carvedilol (COREG) 6.25 MG tablet Take 0.5 tablets (3.125 mg total) by mouth 2 (two) times daily with a meal. 05/18/14   [name], MD  Cholecalciferol (VITAMIN D3) 2000 UNITS TABS Take 2,000 Units by mouth daily.    Historical Provider, MD  Cranberry 12600 MG CAPS Take 12,600 mg by mouth daily.    Historical Provider, MD  CRESTOR 20 MG tablet TAKE 1 TABLET BY MOUTH DAILY 12/22/13   [name], MD  furosemide (LASIX) 20 MG tablet Take 20 mg by mouth daily as needed for fluid.  12/08/13   Historical Provider, MD  ibuprofen (ADVIL,MOTRIN) 200 MG tablet Take 200 mg by mouth every  6 (six) hours as needed for mild pain or moderate pain.    Historical Provider, MD  Multiple Vitamin (MULTIVITAMIN) capsule Take 1 capsule by mouth daily.      Historical Provider, MD  Propylene Glycol 0.6 % SOLN Apply 1 drop to eye daily as needed (itching).    Historical Provider, MD  ramipril (ALTACE) 2.5 MG capsule Take 2.5 mg by mouth daily.    Historical Provider, MD  spironolactone (ALDACTONE) 25 MG tablet TAKE 1 TABLET BY MOUTH EVERY DAY 02/08/14   [name], MD  zolpidem (AMBIEN) 10 MG tablet Take 0.5 tablets (5 mg total) by mouth at bedtime as needed for sleep (Do not take more than  2-3 times per week). 02/22/14   [name], MD    Family History  Problem Relation Age of Onset  . Coronary artery disease Mother   . Coronary artery disease Brother   . Stroke Mother   . Heart attack Brother   . Heart attack Son   . Heart attack Daughter   . Heart attack Sister     History   Social History  . Marital Status: Widowed    Spouse Name: N/A  . Number of Children: 8  . Years of Education: 10   Occupational History  . retired-mill worker    Social History Main Topics  . Smoking status: Never Smoker   . Smokeless tobacco: Never Used  . Alcohol Use: No  . Drug Use: No  . Sexual Activity: Not Currently   Other Topics Concern  . Not on file   Social History Narrative   Health Care POA:    Emergency Contact: son Legrand Como   End of Life Plan: reports done, encourage to bring [place] a copy   Who lives with you: self- retirement community   Any pets: none   Diet: Pt has varied diet of protein, starch, vegetables   Exercise: Pt reports walking almost every day   Seatbelts: Pt reports wearing seatbelt when in vehicles.    Sun Exposure/Protection: sunglasses   Hobbies: church, walking, visiting friends                 Review of Systems: A 12 point ROS discussed and pertinent positives are indicated in the HPI above.  All other systems are negative.  Review of Systems  Constitutional: Negative.   Respiratory: Negative.   Cardiovascular: Negative.   Gastrointestinal: Negative.   Genitourinary: Negative.   Neurological: Negative.      Vital Signs: BP 147/79 mmHg  Pulse 62  Temp(Src) 97.7 F (36.5 C) (Oral)  Resp 14  Ht 5' 3.5" (1.613 m)  Wt 160 lb (72.576 kg)  BMI 27.89 kg/m2  SpO2 94%  Physical Exam  Constitutional: She is oriented to person, place, and time.  Abdominal: Soft. She exhibits no distension. There is no tenderness. There is no rebound and no guarding.  Neurological: She is alert and oriented to person, place, and time.    Nursing note and vitals reviewed.   Imaging: Mr Abdomen W Wo Contrast  05/20/2014   CLINICAL DATA:  Left renal mass.  Followup.  No new complaints.  EXAM: MRI ABDOMEN WITHOUT AND WITH CONTRAST  TECHNIQUE: Multiplanar multisequence MR imaging of the abdomen was performed both before and after the administration of intravenous contrast.  CONTRAST:  77mL MULTIHANCE GADOBENATE DIMEGLUMINE 529 MG/ML IV SOLN  COMPARISON:  07/17/2012  FINDINGS: Lower chest: Mild cardiomegaly. Prior median sternotomy. Presumed rounded atelectasis at the right  lung base is similar.  Evaluation the abdomen is degraded. Specifically, the pre and postcontrast dynamic series are severely degraded, nearly nondiagnostic. This is secondary to a combination of scanner malfunction and mild motion.  Hepatobiliary: Hepatomegaly, 18.5 cm craniocaudal. Mild hepatic steatosis. Foci of arterial hyper enhancement within the left lobe of the liver on image 15 of series 1502 and within the subcapsular right lobe on image 16 of series 15002 are similar. These were felt to be consistent with hemangiomas on prior exam. No new or suspicious liver lesion identified.  Multiple gallstones. There is right upper quadrant edema, new. Example posterior to the gallbladder on image 11 of series 3, adjacent the right lobe the liver on image 17 of series 3.  Pancreas: Pancreas divisum again identified. Edema in the region the pancreatic head. Remainder of the pancreas within normal limits.  Spleen: Normal  Adrenals/Urinary Tract: Normal adrenal glands.  Renal cortical atrophy. Bilateral renal cysts. No change in size of a 1.8 cm enhancing lesion in the medial upper pole right kidney, including on image 31 of series 3. This is consistent with an angiomyolipoma.  The upper pole left renal lesion is again identified. 2.1 x 1.4 cm transverse on image 29 of series 3. 2.3 x 1.3 cm at the same level on the prior exam. On coronal image 8 of series 7, measures 1.7 cm today  versus similar on the prior. Postcontrast characteristics not well evaluated.  No hydronephrosis. No new suspicious renal lesion, given above limitations.  Stomach/Bowel: Normal stomach and abdominal bowel loops.  Vascular/Lymphatic: Normal caliber of the aorta and branch vessels. Patent left renal vein. Prominent porta hepatis nodes are unchanged and likely reactive. No retroperitoneal adenopathy.  Other: None  Musculoskeletal: No acute osseous abnormality. Discogenic edema again identified at the L4-5 level. Probable hemangioma within the right side of the T9 vertebral body.  IMPRESSION: 1. Degraded exam, secondary to scanner malfunction and motion. This primarily involves the dynamic post-contrast series. 2. Given this factor, similar size of an upper pole left renal lesion which was most consistent with a cystic renal cell carcinoma on prior exams. 3. No evidence of metastatic disease. 4. New edema within the right upper quadrant. Concurrent gallstones without biliary ductal dilatation. Considerations include cholecystitis, pancreatitis, or duodenitis. Correlate with right upper quadrant symptoms, possibly dedicated ultrasound, and possibly pancreatic enzyme levels. These results will be called to the ordering clinician or representative by the Radiologist Assistant, and communication documented in the PACS or zVision Dashboard. 5. Hepatomegaly and mild hepatic steatosis.   Electronically Signed   By: Abigail Miyamoto M.D.   On: 05/20/2014 12:35   Dg Chest Port 1 View  05/19/2014   CLINICAL DATA:  81 year old female with bradycardia since last night. Initial encounter.  EXAM: PORTABLE CHEST - 1 VIEW  COMPARISON:  08/20/2013 and earlier.  FINDINGS: Portable AP upright view at 1321 hours. Stable severe cardiomegaly. Stable mediastinal contours. Allowing for portable technique, the lungs are clear. No pneumothorax or effusion.  IMPRESSION: Stable cardiomegaly. No acute cardiopulmonary abnormality.   Electronically  Signed   By: Genevie Ann M.D.   On: 05/19/2014 13:34    Labs:  CBC:  Recent Labs  08/20/13 2000 10/20/13 1009 02/17/14 1120 05/18/14 1220 05/19/14 1320  WBC 7.8  --  7.7  --  7.5  HGB 14.1 13.6 13.4 13.9 12.5  HCT 40.5 40.0 41.2 41.0 38.6  PLT 221  --  223  --  161    COAGS: No results  for input(s): INR, APTT in the last 8760 hours.  BMP:  Recent Labs  08/06/13 1413  08/20/13 2000 10/20/13 1009 02/17/14 1120 05/13/14 0936 05/18/14 1220 05/19/14 1320  NA  --   < > 137 140 140  --  137 137  K  --   < > 5.1 4.6 5.0  --  5.1 5.7*  CL  --   --  97 105 102  --   --  107  CO2  --   --  21  --  26  --   --  21  GLUCOSE  --   < > 116* 115* 105*  --  119* 123*  BUN 20  --  20 30* 28* 31*  --  53*  CALCIUM  --   --  10.2  --  9.8  --   --  9.2  CREATININE 1.27*  < > 1.12* 1.50* 1.25* 1.62*  --  2.16*  GFRNONAA 41*  --  46*  --   --  30*  --  21*  GFRAA 47*  --  53*  --   --  35*  --  24*  < > = values in this interval not displayed.  LIVER FUNCTION TESTS:  Recent Labs  02/17/14 1120  BILITOT 1.4*  AST 25  ALT 21  ALKPHOS 74  PROT 7.1  ALBUMIN 4.0    Assessment and Plan:  Stable partially cystic lesion in the upper pole of the left kidney by MRI. I reviewed imaging findings with Mrs. Bradburn. The upper pole right renal angiomyolipoma is stable. It has been nearly 2 years since prior MRI evaluation with the prior MRI performed on 07/17/2012. Recent laboratory values show elevated creatinine of 2.16 on 05/19/2014. The BUN is elevated at 53 and this may reflect relative dehydration. Given stability of the renal lesion, I have recommended continued surveillance. There is no need to currently pursue percutaneous ablation of the left renal lesion. I offered one additional follow-up MRI in 2 years. We will see how the patient is doing clinically at that time to determine whether she will tolerate imaging.  SignedAletta Edouard T 05/20/2014, 4:48 PM   I spent a total of 15  minutes face to face in clinical consultation, greater than 50% of which was counseling/coordinating care for left renal mass.

## 2014-05-24 DIAGNOSIS — I480 Paroxysmal atrial fibrillation: Secondary | ICD-10-CM | POA: Diagnosis not present

## 2014-05-27 DIAGNOSIS — I4949 Other premature depolarization: Secondary | ICD-10-CM | POA: Diagnosis not present

## 2014-05-27 DIAGNOSIS — I480 Paroxysmal atrial fibrillation: Secondary | ICD-10-CM | POA: Diagnosis not present

## 2014-05-27 DIAGNOSIS — I495 Sick sinus syndrome: Secondary | ICD-10-CM | POA: Diagnosis not present

## 2014-05-31 DIAGNOSIS — I480 Paroxysmal atrial fibrillation: Secondary | ICD-10-CM | POA: Diagnosis not present

## 2014-06-17 DIAGNOSIS — I480 Paroxysmal atrial fibrillation: Secondary | ICD-10-CM | POA: Diagnosis not present

## 2014-07-28 ENCOUNTER — Encounter: Payer: Self-pay | Admitting: Family Medicine

## 2014-07-28 ENCOUNTER — Ambulatory Visit (INDEPENDENT_AMBULATORY_CARE_PROVIDER_SITE_OTHER): Payer: Medicare Other | Admitting: Family Medicine

## 2014-07-28 VITALS — BP 140/75 | HR 70 | Temp 97.8°F | Ht 63.5 in | Wt 161.2 lb

## 2014-07-28 DIAGNOSIS — R609 Edema, unspecified: Secondary | ICD-10-CM

## 2014-07-28 LAB — CBC
HCT: 41.9 % (ref 36.0–46.0)
Hemoglobin: 14 g/dL (ref 12.0–15.0)
MCH: 30.7 pg (ref 26.0–34.0)
MCHC: 33.4 g/dL (ref 30.0–36.0)
MCV: 91.9 fL (ref 78.0–100.0)
MPV: 10.2 fL (ref 8.6–12.4)
Platelets: 202 10*3/uL (ref 150–400)
RBC: 4.56 MIL/uL (ref 3.87–5.11)
RDW: 14.8 % (ref 11.5–15.5)
WBC: 8.1 10*3/uL (ref 4.0–10.5)

## 2014-07-28 LAB — COMPREHENSIVE METABOLIC PANEL
ALT: 15 U/L (ref 0–35)
AST: 24 U/L (ref 0–37)
Albumin: 4.5 g/dL (ref 3.5–5.2)
Alkaline Phosphatase: 80 U/L (ref 39–117)
BUN: 26 mg/dL — ABNORMAL HIGH (ref 6–23)
CO2: 26 mEq/L (ref 19–32)
Calcium: 10 mg/dL (ref 8.4–10.5)
Chloride: 101 mEq/L (ref 96–112)
Creat: 1.48 mg/dL — ABNORMAL HIGH (ref 0.50–1.10)
Glucose, Bld: 113 mg/dL — ABNORMAL HIGH (ref 70–99)
Potassium: 4.8 mEq/L (ref 3.5–5.3)
Sodium: 138 mEq/L (ref 135–145)
Total Bilirubin: 1.5 mg/dL — ABNORMAL HIGH (ref 0.2–1.2)
Total Protein: 7.4 g/dL (ref 6.0–8.3)

## 2014-07-28 NOTE — Progress Notes (Signed)
   Subjective:    Patient ID: Jodi [name], female    DOB: 12-07-1932, 81 y.o.   MRN: [id number]  HPI  Leg Swelling Both legs have been swollen over the last 3 weeks.  Started taking her lasix first two tabs a day and now one tabe daily over the last week.  This has helped her swelling a lot.  No change in exercise tolerance (which is not good) nor chest pain nor shortness of breath at rest.  No new otc medications or change in diet.   Her ramipril was stopped by Dr Einar Gip last visit.  Chief Complaint noted Review of Symptoms - see HPI PMH - Smoking status noted.   Vital Signs reviewed    Review of Systems     Objective:   Physical Exam Alert no acute distress Lungs - clear Heart - usual irregular heart rate with murmu Legs - trace to 1+ edema bilateral ankles       Assessment & Plan:   Peripheral Edema Seems due to CHF without signs of central pulmonary conjestion.  Likely due to her restarting her lasix.  Check labs for worsening renal or liver disease or anemia Continue one lasix per day Monitor closely

## 2014-07-28 NOTE — Patient Instructions (Signed)
Good to see you today!  Thanks for coming in.  I will call you if your tests are not good.  Otherwise I will send you a letter.  If you do not hear from me with in 2 weeks please call our office.     I will let you know whether to keep taking the lasix - take one a day until I contact you.

## 2014-07-29 ENCOUNTER — Encounter: Payer: Self-pay | Admitting: Family Medicine

## 2014-08-12 ENCOUNTER — Other Ambulatory Visit: Payer: Self-pay | Admitting: Family Medicine

## 2014-08-12 MED ORDER — ZOLPIDEM TARTRATE 5 MG PO TABS: 5.0000 mg | ORAL_TABLET | Freq: Every evening | ORAL | Status: DC | PRN

## 2014-08-12 NOTE — Telephone Encounter (Signed)
Received fax for [facility] requesting to change Zolpidem 10 #15 to Zolpidem 5 mg #30 per pt request.  Derl Barrow, RN

## 2014-08-12 NOTE — Telephone Encounter (Signed)
Medication called into pharmacy. Jazmin Hartsell,CMA  

## 2014-08-12 NOTE — Telephone Encounter (Signed)
Pls cal in zolpidem Thanks LC

## 2014-08-19 DIAGNOSIS — H3531 Nonexudative age-related macular degeneration: Secondary | ICD-10-CM | POA: Diagnosis not present

## 2014-08-19 DIAGNOSIS — H35033 Hypertensive retinopathy, bilateral: Secondary | ICD-10-CM | POA: Diagnosis not present

## 2014-08-19 DIAGNOSIS — H40013 Open angle with borderline findings, low risk, bilateral: Secondary | ICD-10-CM | POA: Diagnosis not present

## 2014-08-19 DIAGNOSIS — H3532 Exudative age-related macular degeneration: Secondary | ICD-10-CM | POA: Diagnosis not present

## 2014-09-11 ENCOUNTER — Other Ambulatory Visit: Payer: Self-pay | Admitting: Family Medicine

## 2014-09-23 DIAGNOSIS — R0602 Shortness of breath: Secondary | ICD-10-CM | POA: Diagnosis not present

## 2014-09-23 DIAGNOSIS — I480 Paroxysmal atrial fibrillation: Secondary | ICD-10-CM | POA: Diagnosis not present

## 2014-09-23 DIAGNOSIS — I2510 Atherosclerotic heart disease of native coronary artery without angina pectoris: Secondary | ICD-10-CM | POA: Diagnosis not present

## 2014-09-27 ENCOUNTER — Inpatient Hospital Stay (HOSPITAL_COMMUNITY)
Admission: AD | Admit: 2014-09-27 | Discharge: 2014-09-30 | DRG: 309 | Disposition: A | Discharge status: Home / Self Care | Payer: Medicare Other | Source: Ambulatory Visit | Attending: Cardiology | Admitting: Cardiology

## 2014-09-27 DIAGNOSIS — E780 Pure hypercholesterolemia: Secondary | ICD-10-CM | POA: Diagnosis present

## 2014-09-27 DIAGNOSIS — I129 Hypertensive chronic kidney disease with stage 1 through stage 4 chronic kidney disease, or unspecified chronic kidney disease: Secondary | ICD-10-CM | POA: Diagnosis present

## 2014-09-27 DIAGNOSIS — I080 Rheumatic disorders of both mitral and aortic valves: Secondary | ICD-10-CM | POA: Diagnosis present

## 2014-09-27 DIAGNOSIS — I4892 Unspecified atrial flutter: Secondary | ICD-10-CM | POA: Diagnosis not present

## 2014-09-27 DIAGNOSIS — N183 Chronic kidney disease, stage 3 (moderate): Secondary | ICD-10-CM | POA: Diagnosis present

## 2014-09-27 DIAGNOSIS — Z823 Family history of stroke: Secondary | ICD-10-CM | POA: Diagnosis not present

## 2014-09-27 DIAGNOSIS — Z79899 Other long term (current) drug therapy: Secondary | ICD-10-CM | POA: Diagnosis not present

## 2014-09-27 DIAGNOSIS — Z7901 Long term (current) use of anticoagulants: Secondary | ICD-10-CM

## 2014-09-27 DIAGNOSIS — I252 Old myocardial infarction: Secondary | ICD-10-CM | POA: Diagnosis not present

## 2014-09-27 DIAGNOSIS — Z90710 Acquired absence of both cervix and uterus: Secondary | ICD-10-CM

## 2014-09-27 DIAGNOSIS — I10 Essential (primary) hypertension: Secondary | ICD-10-CM | POA: Diagnosis not present

## 2014-09-27 DIAGNOSIS — Z8249 Family history of ischemic heart disease and other diseases of the circulatory system: Secondary | ICD-10-CM | POA: Diagnosis not present

## 2014-09-27 DIAGNOSIS — E785 Hyperlipidemia, unspecified: Secondary | ICD-10-CM | POA: Diagnosis present

## 2014-09-27 DIAGNOSIS — Z882 Allergy status to sulfonamides status: Secondary | ICD-10-CM | POA: Diagnosis not present

## 2014-09-27 DIAGNOSIS — Z8673 Personal history of transient ischemic attack (TIA), and cerebral infarction without residual deficits: Secondary | ICD-10-CM | POA: Diagnosis not present

## 2014-09-27 DIAGNOSIS — I2510 Atherosclerotic heart disease of native coronary artery without angina pectoris: Secondary | ICD-10-CM | POA: Diagnosis present

## 2014-09-27 DIAGNOSIS — I480 Paroxysmal atrial fibrillation: Secondary | ICD-10-CM | POA: Diagnosis present

## 2014-09-27 DIAGNOSIS — I5032 Chronic diastolic (congestive) heart failure: Secondary | ICD-10-CM | POA: Diagnosis present

## 2014-09-27 DIAGNOSIS — Z951 Presence of aortocoronary bypass graft: Secondary | ICD-10-CM | POA: Diagnosis not present

## 2014-09-27 HISTORY — DX: Chronic kidney disease, stage 3 unspecified: N18.30

## 2014-09-27 HISTORY — DX: Transient cerebral ischemic attack, unspecified: G45.9

## 2014-09-27 HISTORY — DX: Chronic kidney disease, stage 3 (moderate): N18.3

## 2014-09-27 HISTORY — DX: Unspecified osteoarthritis, unspecified site: M19.90

## 2014-09-27 HISTORY — DX: Unspecified macular degeneration: H35.30

## 2014-09-27 LAB — BASIC METABOLIC PANEL
Anion gap: 10 (ref 5–15)
BUN: 19 mg/dL (ref 6–20)
CO2: 24 mmol/L (ref 22–32)
Calcium: 9.7 mg/dL (ref 8.9–10.3)
Chloride: 105 mmol/L (ref 101–111)
Creatinine, Ser: 1.23 mg/dL — ABNORMAL HIGH (ref 0.44–1.00)
GFR calc Af Amer: 47 mL/min — ABNORMAL LOW (ref >60)
GFR calc non Af Amer: 41 mL/min — ABNORMAL LOW (ref >60)
Glucose, Bld: 112 mg/dL — ABNORMAL HIGH (ref 65–99)
Potassium: 4.4 mmol/L (ref 3.5–5.1)
Sodium: 139 mmol/L (ref 135–145)

## 2014-09-27 LAB — CBC
HCT: 39 % (ref 36.0–46.0)
Hemoglobin: 13.3 g/dL (ref 12.0–15.0)
MCH: 31.7 pg (ref 26.0–34.0)
MCHC: 34.1 g/dL (ref 30.0–36.0)
MCV: 93.1 fL (ref 78.0–100.0)
Platelets: 178 10*3/uL (ref 150–400)
RBC: 4.19 MIL/uL (ref 3.87–5.11)
RDW: 14.2 % (ref 11.5–15.5)
WBC: 6.9 10*3/uL (ref 4.0–10.5)

## 2014-09-27 LAB — MAGNESIUM: Magnesium: 2.2 mg/dL (ref 1.7–2.4)

## 2014-09-27 MED ORDER — OCUVITE PO TABS
1.0000 | ORAL_TABLET | Freq: Every day | ORAL | Status: DC
  Administered 2014-09-28 – 2014-09-30 (×3): 1 via ORAL
  Filled 2014-09-27 (×4): qty 1

## 2014-09-27 MED ORDER — SODIUM CHLORIDE 0.9 % IJ SOLN
3.0000 mL | Freq: Two times a day (BID) | INTRAMUSCULAR | Status: DC
  Administered 2014-09-28 – 2014-09-29 (×2): 3 mL via INTRAVENOUS

## 2014-09-27 MED ORDER — APIXABAN 5 MG PO TABS
5.0000 mg | ORAL_TABLET | Freq: Two times a day (BID) | ORAL | Status: DC
  Administered 2014-09-27 – 2014-09-30 (×6): 5 mg via ORAL
  Filled 2014-09-27: qty 1
  Filled 2014-09-27: qty 2
  Filled 2014-09-27 (×4): qty 1

## 2014-09-27 MED ORDER — ROSUVASTATIN CALCIUM 20 MG PO TABS
20.0000 mg | ORAL_TABLET | Freq: Every day | ORAL | Status: DC
  Administered 2014-09-27 – 2014-09-29 (×3): 20 mg via ORAL
  Filled 2014-09-27 (×4): qty 1

## 2014-09-27 MED ORDER — CARVEDILOL 3.125 MG PO TABS
3.1250 mg | ORAL_TABLET | Freq: Every day | ORAL | Status: DC
  Administered 2014-09-28: 3.125 mg via ORAL
  Filled 2014-09-27: qty 1

## 2014-09-27 MED ORDER — CRANBERRY 12600 MG PO CAPS: 12600.0000 mg | ORAL_CAPSULE | Freq: Every day | ORAL | Status: DC

## 2014-09-27 MED ORDER — SODIUM CHLORIDE 0.9 % IJ SOLN: 3.0000 mL | INTRAMUSCULAR | Status: DC | PRN

## 2014-09-27 MED ORDER — BIOTIN 5000 MCG PO TABS: 5000.0000 ug | ORAL_TABLET | Freq: Two times a day (BID) | ORAL | Status: DC

## 2014-09-27 MED ORDER — ZOLPIDEM TARTRATE 5 MG PO TABS
5.0000 mg | ORAL_TABLET | Freq: Every evening | ORAL | Status: DC | PRN
  Administered 2014-09-29: 5 mg via ORAL
  Filled 2014-09-27 (×2): qty 1

## 2014-09-27 MED ORDER — DOFETILIDE 250 MCG PO CAPS
250.0000 ug | ORAL_CAPSULE | Freq: Two times a day (BID) | ORAL | Status: DC
  Administered 2014-09-27 – 2014-09-28 (×2): 250 ug via ORAL
  Filled 2014-09-27 (×2): qty 1

## 2014-09-27 MED ORDER — POLYVINYL ALCOHOL 1.4 % OP SOLN
1.0000 [drp] | Freq: Every day | OPHTHALMIC | Status: DC | PRN
  Filled 2014-09-27: qty 15

## 2014-09-27 MED ORDER — ADULT MULTIVITAMIN W/MINERALS CH
1.0000 | ORAL_TABLET | Freq: Every day | ORAL | Status: DC
  Administered 2014-09-28 – 2014-09-30 (×3): 1 via ORAL
  Filled 2014-09-27 (×4): qty 1

## 2014-09-27 MED ORDER — SPIRONOLACTONE 25 MG PO TABS
25.0000 mg | ORAL_TABLET | Freq: Every day | ORAL | Status: DC
  Administered 2014-09-28 – 2014-09-30 (×3): 25 mg via ORAL
  Filled 2014-09-27 (×4): qty 1

## 2014-09-27 MED ORDER — VITAMIN D3 25 MCG (1000 UNIT) PO TABS
2000.0000 [IU] | ORAL_TABLET | Freq: Every day | ORAL | Status: DC
  Administered 2014-09-28 – 2014-09-30 (×3): 2000 [IU] via ORAL
  Filled 2014-09-27 (×8): qty 2

## 2014-09-27 MED ORDER — SODIUM CHLORIDE 0.9 % IV SOLN: 250.0000 mL | INTRAVENOUS | Status: DC | PRN

## 2014-09-27 MED ORDER — SODIUM CHLORIDE 0.9 % IJ SOLN
3.0000 mL | Freq: Two times a day (BID) | INTRAMUSCULAR | Status: DC
  Administered 2014-09-27 – 2014-09-30 (×5): 3 mL via INTRAVENOUS

## 2014-09-27 NOTE — Progress Notes (Signed)
Pt arrived to the unit as a direct admit at 1530; telemetry applied and verified, VSS, IV started; EKG completed, MD paged and notified of pt arrival to the unit. Pt oriented to the unit and room, call light within reach; family at beside. Will closely monitor pt. Francis Gaines Osmany Azer RN.

## 2014-09-27 NOTE — Progress Notes (Addendum)
Pharmacy Review for Dofetilide (Tikosyn) Initiation  Admit Complaint: 81 y.o. female admitted 09/27/2014 with atrial fibrillation to be initiated on dofetilide.   Assessment:  Patient Exclusion Criteria: If any screening criteria checked as "Yes", then  patient  should NOT receive dofetilide until criteria item is corrected. If "Yes" please indicate correction plan.  YES  NO Patient  Exclusion Criteria Correction Plan  [x]  []  Baseline QTc interval is greater than or equal to 440 msec. IF above YES box checked dofetilide contraindicated unless patient has ICD; then may proceed if QTc 500-550 msec or with known ventricular conduction abnormalities may proceed with QTc 550-600 msec. QTc = 443  Contacted Dr. Einar Gip who read the EKG and considered it normal.  OK to start Tikosyn  []  [x]  Magnesium level is less than 1.8 mEq/l : Last magnesium:  Lab Results  Component Value Date   MG 2.2 09/27/2014         []  [x]  Potassium level is less than 4 mEq/l : Last potassium:  Lab Results  Component Value Date   K 4.4 09/27/2014         []  [x]  Patient is known or suspected to have a digoxin level greater than 2 ng/ml: No results found for: DIGOXIN    []  [x]  Creatinine clearance less than 20 ml/min (calculated using Cockcroft-Gault, actual body weight and serum creatinine): Estimated Creatinine Clearance: 35.4 mL/min (by C-G formula based on Cr of 1.23).    []  [x]  Patient has received drugs known to prolong the QT intervals within the last 48 hours (phenothiazines, tricyclics or tetracyclic antidepressants, erythromycin, H-1 antihistamines, cisapride, fluoroquinolones, azithromycin). Drugs not listed above may have an, as yet, undetected potential to prolong the QT interval, updated information on QT prolonging agents is available at this website:QT prolonging agents   []  [x]  Patient received a dose of hydrochlorothiazide (Oretic) alone or in any combination including triamterene (Dyazide, Maxzide)  in the last 48 hours.   []  [x]  Patient received a medication known to increase dofetilide plasma concentrations prior to initial dofetilide dose:  . Trimethoprim (Primsol, Proloprim) in the last 36 hours . Verapamil (Calan, Verelan) in the last 36 hours or a sustained release dose in the last 72 hours . Megestrol (Megace) in the last 5 days  . Cimetidine (Tagamet) in the last 6 hours . Ketoconazole (Nizoral) in the last 24 hours . Itraconazole (Sporanox) in the last 48 hours  . Prochlorperazine (Compazine) in the last 36 hours    []  [x]  Patient is known to have a history of torsades de pointes; congenital or acquired long QT syndromes.   []  [x]  Patient has received a Class 1 antiarrhythmic with less than 2 half-lives since last dose. (Disopyramide, Quinidine, Procainamide, Lidocaine, Mexiletine, Flecainide, Propafenone)   []  [x]  Patient has received amiodarone therapy in the past 3 months or amiodarone level is greater than 0.3 ng/ml.    Patient has been appropriately anticoagulated with Eliquis.  Ordering provider was confirmed at [email or web address] if they are not listed on the [place] Prescribers list.  Goal of Therapy: Follow renal function, electrolytes, potential drug interactions, and dose adjustment. Provide education and 1 week supply at discharge.  Plan:  [x]   Physician selected initial dose within range recommended for patients level of renal function - will monitor for response.  []   Physician selected initial dose outside of range recommended for patients level of renal function - will discuss if the dose should be altered at this time.   Select  One Calculated CrCl  Dose q12h  []  > 60 ml/min 500 mcg  [x]  40-60 ml/min 250 mcg  []  20-40 ml/min 125 mcg   2. Follow up QTc after the first 5 doses, renal function, electrolytes (K & Mg) daily x 3     days, dose adjustment, success of initiation and facilitate 1 week discharge supply as     clinically indicated.  3.  Initiate Tikosyn education video (Call [phone] and ask for video # [phone]).  4. Place Enrollment Form on the chart for discharge supply of dofetilide.    Kammi Hechler D. Mina Marble, PharmD, BCPS Pager:  [phone] 09/27/2014, 7:52 PM

## 2014-09-27 NOTE — H&P (Signed)
Jodi [name] is an 81 y.o. female.   Chief Complaint: Fatigue and dyspnea HPI: Jodi [name]  is a 81 y.o. female   who presents with worsening dyspnea and fatigue that started 2-3 weeks ago.   Patient extremely symptomatic with regard to atrial fibrillation. She has previously undergone cardioversion 2 on 10/20/2013 and 40 05/26/14. She could not tolerate amiodarone due to severe acardia and development of heart block and junctional escape rhythm.  Over the past 2-3 weeks she has noticed marked fatigue and marked dyspnea even doing minimal activities. She has not had any chest pain, palpitations, PND or orthopnea. Tolerating anticoagulation without any GI bleed. No leg edema.  She also has history of mild aortic stenosis and moderate to moderately severe mitral regurgitation by cardiac catheterization on 11/24/2013 as echocardiogram and revealed critical aortic stenosis. Hence medical therapy only was recommended. She has hypertension, CAD and h/o CABG and chronic renal insufficiency.  Past Medical History  Diagnosis Date  . Myocardial infarction   . Coronary artery disease   . Hypertension   . Heart murmur   . Chronic kidney disease   . Aortic stenosis   . CHF (congestive heart failure)   . Hypercholesteremia   . Shortness of breath   . Stroke   . Renal mass 12/06/2010    CT Abdomen 11-27-10 upper pole region of the right kidney which is suspicious for solid lesion, measuring 1.9 x 1.3 cm. This lesion is concerning for renal cell carcinoma given the solid appearance.  Following with Dr [name].  Renal bx was benign    . Splenic infarct 12/06/2010  . Renal cell carcinoma 09/23/2012  . Hyperlipidemia     Past Surgical History  Procedure Laterality Date  . Radical hysterectomy    . Coronary artery bypass graft  1995 and 2003  . Abdominal hysterectomy    . Eye surgery    . Cardiac catheterization    . Cardioversion N/A 10/20/2013    Procedure: CARDIOVERSION;  Surgeon:  Laverda Page, MD;  Location: [facility] ENDOSCOPY;  Service: Cardiovascular;  Laterality: N/A;  H&P in file  . Percutaneous needle biopsy of renal lesion    . Left and right heart catheterization with coronary angiogram N/A 11/24/2013    Procedure: LEFT AND RIGHT HEART CATHETERIZATION WITH CORONARY ANGIOGRAM;  Surgeon: Laverda Page, MD;  Location: [facility] CATH LAB;  Service: Cardiovascular;  Laterality: N/A;  . Cardioversion N/A 05/18/2014    Procedure: CARDIOVERSION;  Surgeon: [name], MD;  Location: [facility] ENDOSCOPY;  Service: Cardiovascular;  Laterality: N/A;    Family History  Problem Relation Age of Onset  . Coronary artery disease Mother   . Coronary artery disease Brother   . Stroke Mother   . Heart attack Brother   . Heart attack Son   . Heart attack Daughter   . Heart attack Sister    Social History:  reports that she has never smoked. She has never used smokeless tobacco. She reports that she does not drink alcohol or use illicit drugs.  Allergies:  Allergies  Allergen Reactions  . Sulfamethoxazole-Trimethoprim Other (See Comments)    REACTION: shaking /chills   Review of Systems - Negative except Dyspnea and fatigue  Blood pressure 163/84, pulse 68, temperature 98.5 F (36.9 C), temperature source Oral, resp. rate 20, height 5' 3"  (1.6 m), weight 72.4 kg (159 lb 9.8 oz), SpO2 99 %. General appearance: alert, cooperative, appears stated age and no distress Eyes: negative findings: lids and lashes  normal Neck: no adenopathy, no JVD, supple, symmetrical, trachea midline and thyroid not enlarged, symmetric, no tenderness/mass/nodules Neck: JVP - normal, carotids 2+= without bruits Resp: clear to auscultation bilaterally Chest wall: no tenderness Cardio: S1 is variable, S2 is normal. 2/6 systolic ejection murmur at the apex and right sternal border, conducted to the carotid artery. GI: soft, non-tender; bowel sounds normal; no masses,  no organomegaly Extremities: extremities  normal, atraumatic, no cyanosis or edema Pulses: 2+ and symmetric Bilateral carotid artery bruit, soft present. Skin: Skin color, texture, turgor normal. No rashes or lesions Neurologic: Grossly normal  Labs:   Lab Results  Component Value Date   WBC 8.1 07/28/2014   HGB 14.0 07/28/2014   HCT 41.9 07/28/2014   MCV 91.9 07/28/2014   PLT 202 07/28/2014     TSH  Recent Labs  11/18/13 1413 02/17/14 1120  TSH 2.010 3.342    EKG: Atrial fibrillation, coarse with controlled ventricular sponsor. Borderline criteria for LVH. Nonspecific T abnormality. Normal QT interval.  Medications Prior to Admission  Medication Sig Dispense Refill  . apixaban (ELIQUIS) 5 MG TABS tablet Take 1 tablet (5 mg total) by mouth 2 (two) times daily. 60 tablet 1  . beta carotene w/minerals (OCUVITE) tablet Take 1 tablet by mouth daily.    . Biotin 5000 MCG TABS Take 5,000 mcg by mouth 2 (two) times daily.    . carvedilol (COREG) 6.25 MG tablet Take 0.5 tablets (3.125 mg total) by mouth 2 (two) times daily with a meal. (Patient taking differently: Take 3.125 mg by mouth daily. ) 60 tablet 6  . Cholecalciferol (VITAMIN D3) 2000 UNITS TABS Take 2,000 Units by mouth daily.    . Cranberry 12600 MG CAPS Take 12,600 mg by mouth daily.    . CRESTOR 20 MG tablet TAKE 1 TABLET BY MOUTH DAILY 90 tablet 2  . furosemide (LASIX) 20 MG tablet Take 20 mg by mouth daily as needed for fluid.   0  . Multiple Vitamin (MULTIVITAMIN) capsule Take 1 capsule by mouth daily.      Marland Kitchen Propylene Glycol 0.6 % SOLN Apply 1 drop to eye daily as needed (itching).    Marland Kitchen spironolactone (ALDACTONE) 25 MG tablet TAKE 1 TABLET BY MOUTH EVERY DAY. 30 tablet 6  . zolpidem (AMBIEN) 5 MG tablet Take 1 tablet (5 mg total) by mouth at bedtime as needed for sleep (Do not take more than 2-3 times per week). 25 tablet 3    Current facility-administered medications:  .  0.9 %  sodium chloride infusion, 250 mL, Intravenous, PRN, [name], MD .   apixaban (ELIQUIS) tablet 5 mg, 5 mg, Oral, BID, Neldon Labella, NP .  beta carotene w/minerals (OCUVITE) tablet 1 tablet, 1 tablet, Oral, Daily, Neldon Labella, NP .  carvedilol (COREG) tablet 3.125 mg, 3.125 mg, Oral, Daily, Neldon Labella, NP .  cholecalciferol (VITAMIN D) tablet 2,000 Units, 2,000 Units, Oral, Daily, Neldon Labella, NP .  dofetilide (TIKOSYN) capsule 250 mcg, 250 mcg, Oral, BID, [name], MD .  Derrill Memo ON 09/28/2014] multivitamin with minerals tablet 1 tablet, 1 tablet, Oral, Daily, Neldon Labella, NP .  polyvinyl alcohol (LIQUIFILM TEARS) 1.4 % ophthalmic solution 1 drop, 1 drop, Both Eyes, Daily PRN, Neldon Labella, NP .  rosuvastatin (CRESTOR) tablet 20 mg, 20 mg, Oral, Daily, Neldon Labella, NP .  sodium chloride 0.9 % injection 3 mL, 3 mL, Intravenous, Q12H, Neldon Labella, NP .  sodium chloride 0.9 % injection 3 mL, 3 mL, Intravenous, Q12H,  [name], MD .  sodium chloride 0.9 % injection 3 mL, 3 mL, Intravenous, PRN, [name], MD .  spironolactone (ALDACTONE) tablet 25 mg, 25 mg, Oral, Daily, Neldon Labella, NP .  zolpidem (AMBIEN) tablet 5 mg, 5 mg, Oral, QHS PRN, Neldon Labella, NP  Assessment/Plan 1. Symptomatic paroxysmal atrial fibrillation with controlled response. 2. CAD in native artery : CABG in 1994. Stents in 2002. H/O Redo CABG 06/05/01 consisting of a patent LIMA to the LAD, with free RIMA to RI, SVG to OM1, SVG to PL A, SVG to ramus intermediate branch Dr. [name]. Lexiscan stress 12/15/10: Inferior, septal and distal anterior and apical scar. No ischemia. EF 47%. 3. Benign essential HTN  Labs 05/24/2014: Serum glucose 115, creatinine 1.33, eGFR 38, potassium 4.9. 4. Hypercholesteremia 5. Other dyspnea and respiratory abnormality  6. Chronic renal insufficiency, stage III  Recommendation: Patient now admitted for Tikosyn administration. She continues to be symptomatic. All questions regarding Tikosyn was discussed with the  patient and her daughter at the bedside. If she does not convert to sinus rhythm, I will plan on performing cardioversion in 2 days.  [name], MD 09/27/2014, 6:35 PM [facility] [place] Pager: [phone] Office: [phone] If no answer: Cell:  [phone]

## 2014-09-28 ENCOUNTER — Encounter (HOSPITAL_COMMUNITY): Payer: Self-pay | Admitting: General Practice

## 2014-09-28 LAB — BASIC METABOLIC PANEL
Anion gap: 6 (ref 5–15)
BUN: 23 mg/dL — ABNORMAL HIGH (ref 6–20)
CO2: 26 mmol/L (ref 22–32)
Calcium: 8.9 mg/dL (ref 8.9–10.3)
Chloride: 106 mmol/L (ref 101–111)
Creatinine, Ser: 1.36 mg/dL — ABNORMAL HIGH (ref 0.44–1.00)
GFR calc Af Amer: 42 mL/min — ABNORMAL LOW (ref >60)
GFR calc non Af Amer: 36 mL/min — ABNORMAL LOW (ref >60)
Glucose, Bld: 91 mg/dL (ref 65–99)
Potassium: 4.3 mmol/L (ref 3.5–5.1)
Sodium: 138 mmol/L (ref 135–145)

## 2014-09-28 LAB — MAGNESIUM: Magnesium: 2 mg/dL (ref 1.7–2.4)

## 2014-09-28 MED ORDER — DOFETILIDE 125 MCG PO CAPS
125.0000 ug | ORAL_CAPSULE | Freq: Two times a day (BID) | ORAL | Status: DC
  Administered 2014-09-28 – 2014-09-30 (×4): 125 ug via ORAL
  Filled 2014-09-28 (×6): qty 1

## 2014-09-28 NOTE — Clinical Documentation Improvement (Signed)
Cardiology   Query #1 of 2 Please document the Acuity and Type of Heart Failure is monitored and treated this admission:  Acuity:  Acute, Chronic, Acute on Chronic And Type:  Systolic, Diastolic, Combined  Clinical Information: "CHF" is documented in the H&P Home medications include:  Coreg,  Lasix prn, and Aldactone. EF 47% by Lexiscan stress 12/25/2010 per current medical record.  Query #2 of 2 Please document if a condition below provides greater specificity regarding the patient's "chronic renal insufficiency, stage III:   - Stage 3 Chronic Kidney Disease  - Other Condition  - Unable to clinically determine  Clinical Information: "Chronic renal insufficiency, stage III" is documented in the H&P GFR this admission (white female) Component     Latest Ref Rng 09/27/2014 09/28/2014  EGFR (Non-African Amer.)     >60 mL/min 41 (L) 36 (L)   Thank You, Erling Conte RN BSN CCDS [place]

## 2014-09-28 NOTE — Progress Notes (Signed)
Pt HR drops as low as 35, but then returns to 60s. Pt asymptomatic. EKG obtained and reads SB. Frequent PAC's and PVC's noted on tele strip. Dr. Irven Shelling office notified.   Will continue to monitor.   Jodi [name]  2130  Dr. Einar Gip returned phone call and discontinued daily dose of coreg. He will see her in the am for further follow up as long as she is asymptomatic.   Will continue to monitor.

## 2014-09-28 NOTE — Progress Notes (Signed)
Utilization review completed.  

## 2014-09-28 NOTE — Progress Notes (Signed)
Pt converted back to NSR at 1200 as notified by telemetry and confirmed on monitor. Pt stable during shift. QTc was 452sec this am; EKG obtain as ordered by MD to confirm NSR. Reported off to oncoming RN. Francis Gaines Reshawn Ostlund RN.

## 2014-09-28 NOTE — Care Management Note (Signed)
Case Management Note Marvetta Gibbons RN, BSN Unit 2W-Case Manager [phone]  Patient Details  Name: Jodi [name] MRN: [id number] Date of Birth: Jun 07, 1932  Subjective/Objective:  Pt admitted with afib and Tikosyn load                  Action/Plan: PTA pt lived at home- anticipate return home when medically stable- per pt she uses [facility] and home delivery for medications  Expected Discharge Date:                  Expected Discharge Plan:  Home/Self Care  In-House Referral:     Discharge planning Services  CM Consult, Medication Assistance  Post Acute Care Choice:    Choice offered to:     DME Arranged:    DME Agency:     HH Arranged:    [place] Agency:     Status of Service:  In process, will continue to follow  Medicare Important Message Given:    Date Medicare IM Given:    Medicare IM give by:    Date Additional Medicare IM Given:    Additional Medicare Important Message give by:     If discussed at Powers of Stay Meetings, dates discussed:    Additional Comments:  09/28/14- referral for Tikosyn needs- per benefits check on Tikosyn coverage- Per rep at [facility]  Tikosyn: no auth required  Patient has low income rx assistance for all medications:  Brand: $7.40  Generic: $2.94  Patient can use most major retail pharmacies ------- spoke with pt and daughter at bedside- above info shared- - per pt she uses home delivery for medications- call made to [facility] and they are in the [place] program and will be able to order Tikosyn once script has been received - explained to pt and daughter that pt would be given 7 day supply upon discharge from [place] in order to give pt's pharmacy time to order drug to be in stock for her. Pt will need 2 scripts- one with no refills for 7 day supply to be sent down to [place] to fill at discharge- the other script will need refills for pt to take to her pharmacy to fill at discharge. Please call CM for  further assistance if needed.   [name], RN 09/28/2014, 11:46 AM

## 2014-09-28 NOTE — Progress Notes (Signed)
Subjective:  Sitting up in bed eating, denies any new symptoms or concerns today.  Objective:  Vital Signs in the last 24 hours: Temp:  [97.7 F (36.5 C)-98.5 F (36.9 C)] 97.7 F (36.5 C) (08/23 1031) Pulse Rate:  [68-100] 90 (08/23 1031) Resp:  [18-20] 18 (08/23 1031) BP: (117-173)/(77-102) 133/77 mmHg (08/23 1031) SpO2:  [96 %-99 %] 97 % (08/23 1031) Weight:  [72.4 kg (159 lb 9.8 oz)] 72.4 kg (159 lb 9.8 oz) (08/22 1530)  Intake/Output from previous day: 08/22 0701 - 08/23 0700 In: -  Out: 400 [Urine:400]  Physical Exam: General appearance: alert, cooperative, appears stated age and no distress Eyes: negative findings: lids and lashes normal Neck: no adenopathy, no JVD, supple, symmetrical, trachea midline and thyroid not enlarged, symmetric, no tenderness/mass/nodules Resp: clear to auscultation bilaterally Chest wall: no tenderness Cardio: S1 irregular, S2 is normal. 2/6 systolic ejection murmur at the apex and right sternal border, conducted to the carotid artery. GI: soft, non-tender; bowel sounds normal; no masses, no organomegaly Extremities: extremities normal, atraumatic, no cyanosis or edema Pulses: 2+ and symmetric Bilateral carotid artery bruit, soft present. Skin: Skin color, texture, turgor normal. No rashes or lesions Neurologic: Grossly normal  Lab Results: BMP  Recent Labs  05/19/14 1320 07/28/14 1038 09/27/14 1838 09/28/14 0407  NA 137 138 139 138  K 5.7* 4.8 4.4 4.3  CL 107 101 105 106  CO2 21 26 24 26   GLUCOSE 123* 113* 112* 91  BUN 53* 26* 19 23*  CREATININE 2.16* 1.48* 1.23* 1.36*  CALCIUM 9.2 10.0 9.7 8.9  GFRNONAA 21*  --  41* 36*  GFRAA 24*  --  47* 42*    CBC  Recent Labs Lab 09/27/14 1838  WBC 6.9  RBC 4.19  HGB 13.3  HCT 39.0  PLT 178  MCV 93.1  MCH 31.7  MCHC 34.1  RDW 14.2    HEMOGLOBIN A1C Lab Results  Component Value Date   HGBA1C 6.4* 11/29/2010   MPG 137* 11/29/2010    Cardiac Panel (last 3 results) No  results for input(s): CKTOTAL, CKMB, TROPONINI, RELINDX in the last 8760 hours.  BNP (last 3 results) No results for input(s): PROBNP in the last 8760 hours.  TSH  Recent Labs  11/18/13 1413 02/17/14 1120  TSH 2.010 3.342    CHOLESTEROL No results for input(s): CHOL in the last 8760 hours.  Hepatic Function Panel  Recent Labs  02/17/14 1120 07/28/14 1038  PROT 7.1 7.4  ALBUMIN 4.0 4.5  AST 25 24  ALT 21 15  ALKPHOS 74 80  BILITOT 1.4* 1.5*    Imaging: No results found.  Cardiac Studies:  EKG 09/28/2014: Atrial flutter with variable conduction. Borderline criteria for LVH. Nonspecific T wave abnormality. Normal QT.   Assessment/Plan:  1. Symptomatic paroxysmal atrial fibrillation with controlled response. (CHA2DS2-VASc Score is 5 with yearly risk of stroke of 6.7%.  HAS-Bled score is 2 and estimated major bleeding in one year is 1.88-3.2%) 2. Paroxysmal atrial flutter with variable conduction 3. CAD in native artery : CABG in 1994. Stents in 2002. H/O Redo CABG 06/05/01 consisting of a patent LIMA to the LAD, with free RIMA to RI, SVG to OM1, SVG to PL A, SVG to ramus intermediate branch Dr. [name]. Lexiscan stress 12/15/10: Inferior, septal and distal anterior and apical scar. No ischemia. EF 47%. 4. Benign essential HTN  Labs 05/24/2014: Serum glucose 115, creatinine 1.33, eGFR 38, potassium 4.9. 5. Hypercholesteremia 6. Other dyspnea and respiratory  abnormality  7. Chronic renal insufficiency, stage III  Recommendation: Patient now admitted for Tikosyn administration. Has had 2 doses with normal QT.  Symptoms have resolved.  She appears to be in atrial flutter today.  Will schedule for cardioversion tomorrow.  Rachel Bo, NP-C 09/28/2014, 10:51 AM [facility] Pager: [phone] Office: [phone]

## 2014-09-28 NOTE — Progress Notes (Signed)
Pt HR dropped to the 30 and back up to the 50's. Dr. Einar Gip notified, no new orders receive. Will continue to closely monitor. P. Amo Ranay Ketter Rn.

## 2014-09-29 ENCOUNTER — Encounter (HOSPITAL_COMMUNITY)
Admission: AD | Disposition: A | Discharge status: Home / Self Care | Payer: Self-pay | Source: Ambulatory Visit | Attending: Cardiology

## 2014-09-29 LAB — BASIC METABOLIC PANEL
Anion gap: 7 (ref 5–15)
BUN: 20 mg/dL (ref 6–20)
CO2: 25 mmol/L (ref 22–32)
Calcium: 8.9 mg/dL (ref 8.9–10.3)
Chloride: 103 mmol/L (ref 101–111)
Creatinine, Ser: 1.4 mg/dL — ABNORMAL HIGH (ref 0.44–1.00)
GFR calc Af Amer: 40 mL/min — ABNORMAL LOW (ref >60)
GFR calc non Af Amer: 35 mL/min — ABNORMAL LOW (ref >60)
Glucose, Bld: 163 mg/dL — ABNORMAL HIGH (ref 65–99)
Potassium: 4.9 mmol/L (ref 3.5–5.1)
Sodium: 135 mmol/L (ref 135–145)

## 2014-09-29 LAB — MAGNESIUM: Magnesium: 2.2 mg/dL (ref 1.7–2.4)

## 2014-09-29 SURGERY — CARDIOVERSION
Anesthesia: Monitor Anesthesia Care

## 2014-09-29 NOTE — Progress Notes (Signed)
Subjective:  Doing well and remains asymptomatic and states she is aware that she is in sinus rhythm. Patient has had low heart rate, completely asymptomatic and was sleeping. Also with minimal activity heart rate improves to 60-62 beats a minute. Otherwise feels well, no PND or orthopnea. No dizziness or syncope. No high-grade heart block on telemetry.  Patient extremely symptomatic with regard to atrial fibrillation. She has previously undergone cardioversion 2 on 10/20/2013 and 40 05/26/14. She could not tolerate amiodarone due to severe bradycardia and development of heart block and junctional escape rhythm.  Over the past 2-3 weeks she had noticed marked fatigue and marked dyspnea even doing minimal activities. She has not had any chest pain, palpitations, PND or orthopnea. Tolerating anticoagulation without any GI bleed. No leg edema.  Objective:  Vital Signs in the last 24 hours: Temp:  [97.7 F (36.5 C)-98 F (36.7 C)] 98 F (36.7 C) (08/24 0556) Pulse Rate:  [57-90] 57 (08/24 0556) Resp:  [18-20] 18 (08/24 0556) BP: (125-144)/(50-77) 125/50 mmHg (08/24 0556) SpO2:  [97 %-99 %] 99 % (08/24 0556)  Intake/Output from previous day: 08/23 0701 - 08/24 0700 In: 480 [P.O.:480] Out: 650 [Urine:650]  Physical Exam: General appearance: alert, cooperative, appears stated age and no distress Eyes: negative findings: lids and lashes normal Neck: no adenopathy, no JVD, supple, symmetrical, trachea midline and thyroid not enlarged, symmetric, no tenderness/mass/nodules Resp: clear to auscultation bilaterally Chest wall: no tenderness Cardio: S1 irregular, S2 is normal. 3/6 systolic ejection murmur at the apex and right sternal border, conducted to the carotid artery. GI: soft, non-tender; bowel sounds normal; no masses, no organomegaly Extremities: extremities normal, atraumatic, no cyanosis or edema Pulses: 2+ and symmetric Bilateral carotid artery bruit, soft present. Skin: Skin  color, texture, turgor normal. No rashes or lesions Neurologic: Grossly normal  Lab Results: BMP  Recent Labs  09/27/14 1838 09/28/14 0407 09/29/14 0453  NA 139 138 135  K 4.4 4.3 4.9  CL 105 106 103  CO2 24 26 25   GLUCOSE 112* 91 163*  BUN 19 23* 20  CREATININE 1.23* 1.36* 1.40*  CALCIUM 9.7 8.9 8.9  GFRNONAA 41* 36* 35*  GFRAA 47* 42* 40*    CBC  Recent Labs Lab 09/27/14 1838  WBC 6.9  RBC 4.19  HGB 13.3  HCT 39.0  PLT 178  MCV 93.1  MCH 31.7  MCHC 34.1  RDW 14.2    TSH  Recent Labs  11/18/13 1413 02/17/14 1120  TSH 2.010 3.342    Hepatic Function Panel  Recent Labs  02/17/14 1120 07/28/14 1038  PROT 7.1 7.4  ALBUMIN 4.0 4.5  AST 25 24  ALT 21 15  ALKPHOS 74 80  BILITOT 1.4* 1.5*    EKG 09/28/2014: Atrial flutter with variable conduction. Borderline criteria for LVH. Nonspecific T wave abnormality. Normal QT.  Scheduled Meds: . apixaban  5 mg Oral BID  . beta carotene w/minerals  1 tablet Oral Daily  . cholecalciferol  2,000 Units Oral Daily  . dofetilide  125 mcg Oral BID  . multivitamin with minerals  1 tablet Oral Daily  . rosuvastatin  20 mg Oral QHS  . sodium chloride  3 mL Intravenous Q12H  . sodium chloride  3 mL Intravenous Q12H  . spironolactone  25 mg Oral Daily   Continuous Infusions:  PRN Meds:.sodium chloride, polyvinyl alcohol, sodium chloride, zolpidem   Assessment/Plan:  1. Symptomatic paroxysmal atrial fibrillation with controlled response. (CHA2DS2-VASc Score is 5 with yearly risk of  stroke of 6.7%.  HAS-Bled score is 2 and estimated major bleeding in one year is 1.88-3.2%) 2. Paroxysmal atrial flutter with variable conduction 3. CAD in native artery : CABG in 1994. Stents in 2002. H/O Redo CABG 06/05/01 consisting of a patent LIMA to the LAD, with free RIMA to RI, SVG to OM1, SVG to PL A, SVG to ramus intermediate branch Dr. [name]. Lexiscan stress 12/15/10: Inferior, septal and distal anterior and apical scar. No  ischemia. EF 47%. 4. Benign essential HTN  Labs 05/24/2014: Serum glucose 115, creatinine 1.33, eGFR 38, potassium 4.9. 5. Hypercholesteremia 6. Other dyspnea and respiratory abnormality  7. Chronic renal insufficiency, stage III 8. Chronic diastolic heart failure due to valvular heart disease, Mod AS and mod to severe MR.  Recommendation: Patient now admitted for Tikosyn administration. Has had 4 doses with normal QT.  Symptoms have resolved.  Now in S. Bradycardia @ 51/min. Continue present dose of Tikosyn.  Will decrease the dose of Aldactone to 12.5 mg due to elevated S. Cr, but discharge on 25 mg tomorrow as her diet at home and fluid restriction not the same.  [name], NP-C 09/29/2014, 8:38 AM [facility] Pager: [phone] Office: [phone]

## 2014-09-29 NOTE — Progress Notes (Signed)
Pt HR dropped to SB at 35. Pt sleeping at the time and asymptomatic. HR returned back to lower 40s.   Will continue to monitor.   Earlie Lou

## 2014-09-30 LAB — MAGNESIUM: Magnesium: 2.1 mg/dL (ref 1.7–2.4)

## 2014-09-30 LAB — BASIC METABOLIC PANEL
Anion gap: 7 (ref 5–15)
BUN: 22 mg/dL — ABNORMAL HIGH (ref 6–20)
CO2: 25 mmol/L (ref 22–32)
Calcium: 8.9 mg/dL (ref 8.9–10.3)
Chloride: 105 mmol/L (ref 101–111)
Creatinine, Ser: 1.17 mg/dL — ABNORMAL HIGH (ref 0.44–1.00)
GFR calc Af Amer: 50 mL/min — ABNORMAL LOW (ref >60)
GFR calc non Af Amer: 43 mL/min — ABNORMAL LOW (ref >60)
Glucose, Bld: 98 mg/dL (ref 65–99)
Potassium: 4.2 mmol/L (ref 3.5–5.1)
Sodium: 137 mmol/L (ref 135–145)

## 2014-09-30 MED ORDER — DOFETILIDE 125 MCG PO CAPS: 125.0000 ug | ORAL_CAPSULE | Freq: Two times a day (BID) | ORAL | Status: DC

## 2014-09-30 NOTE — Care Management Important Message (Signed)
Important Message  Patient Details  Name: Jodi [name] MRN: [id number] Date of Birth: 03-12-1932   Medicare Important Message Given:  Yes-second notification given    Nathen May 09/30/2014, 11:04 AMImportant Message  Patient Details  Name: Jodi [name] MRN: [id number] Date of Birth: [birth year]   Medicare Important Message Given:  [facility] notification given    Nathen May 09/30/2014, 11:04 AMImportant Message  Patient Details  Name: Jodi [name] MRN: [id number] Date of Birth: 03/04/1932   Medicare Important Message Given:  Yes-second notification given    Nathen May 09/30/2014, 11:03 AM

## 2014-09-30 NOTE — Progress Notes (Signed)
Pt discharge education and instructions completed with pt at beside; pt denies any questions and voices understanding. Pt discharge home with family to transport her home. Pt handed her one week prescription fill of Tikosyn. Pt IV and telemetry removed; pt transported off unit via wheelchair with belongings to the side. Francis Gaines Shakela Donati RN.

## 2014-09-30 NOTE — Discharge Summary (Signed)
Physician Discharge Summary  Patient ID: SALVATRICE SUPPLE MRN: [id number] DOB/AGE: [birth year] 79 y.o.  Admit date: 09/27/2014 Discharge date: 09/30/2014  Primary Discharge Diagnosis: Paroxysmal a.fib/flutter  Secondary Discharge Diagnosis: 1. CAD in native artery : CABG in 1994. Stents in 2002. H/O Redo CABG 06/05/01 consisting of a patent LIMA to the LAD, with free RIMA to RI, SVG to OM1, SVG to PL A, SVG to ramus intermediate branch Dr. [name]. Lexiscan stress 12/15/10: Inferior, septal and distal anterior and apical scar. No ischemia. EF 47%. 2. Benign essential HTN  3. Hyperlipidemia 4. Other dyspnea and respiratory abnormality  5. Chronic renal insufficiency, stage III  Hospital Course: Jodi [name] is a 81 y.o. female who presents with worsening dyspnea and fatigue that started 2-3 weeks ago. Patient extremely symptomatic with regard to atrial fibrillation. She has previously undergone cardioversion 2 on 10/20/2013 and 05/26/14. She could not tolerate amiodarone due to severe bradycardia and development of heart block and junctional escape rhythm.  Over the past 2-3 weeks, she has noticed marked fatigue and marked dyspnea even doing minimal activities. She has not had any chest pain, palpitations, PND or orthopnea. Tolerating anticoagulation without any GI bleed. No leg edema.  She was admitted for Tikosyn administration due to recurrent, symptomatic a.fib.  The next day, she was in a.flutter and was scheduled for cardioversion, however, later converted to sinus without DCCV and symptoms resolved.  Pt remains asymptomatic today.  Has received 6 doses of Tikosyn with no change in QT interval.  Recommendations on discharge: Continue Tikosyn on d/c. Follow up outpatient in 2 weeks as scheduled.    Discharge Exam: Blood pressure 142/53, pulse 48, temperature 97.4 F (36.3 C), temperature source Oral, resp. rate 18, height 5\' 3"  (1.6 m), weight 72.4 kg (159 lb 9.8 oz), SpO2 99  %.    General appearance: alert, cooperative, appears stated age and no distress Eyes: negative findings: lids and lashes normal Neck: no adenopathy, no JVD, supple, symmetrical, trachea midline and thyroid not enlarged, symmetric, no tenderness/mass/nodules Resp: clear to auscultation bilaterally Chest wall: no tenderness Cardio: S1 normal, S2 is normal. 3/6 systolic ejection murmur at the apex and right sternal border, conducted to the carotid artery. GI: soft, non-tender; bowel sounds normal; no masses, no organomegaly Extremities: extremities normal, atraumatic, no cyanosis or edema Pulses: 2+ and symmetric Bilateral carotid artery bruit, soft present. Skin: Skin color, texture, turgor normal. No rashes or lesions Neurologic: Grossly normal  Labs:   Lab Results  Component Value Date   WBC 6.9 09/27/2014   HGB 13.3 09/27/2014   HCT 39.0 09/27/2014   MCV 93.1 09/27/2014   PLT 178 09/27/2014    Recent Labs Lab 09/30/14 0428  NA 137  K 4.2  CL 105  CO2 25  BUN 22*  CREATININE 1.17*  CALCIUM 8.9  GLUCOSE 98    Lipid Panel     Component Value Date/Time   CHOL 181 11/10/2012 1345   TRIG 247* 11/10/2012 1345   HDL 50 11/10/2012 1345   CHOLHDL 3.6 11/10/2012 1345   VLDL 49* 11/10/2012 1345   LDLCALC 82 11/10/2012 1345    BNP (last 3 results) No results for input(s): BNP in the last 8760 hours.  ProBNP (last 3 results) No results for input(s): PROBNP in the last 8760 hours.  HEMOGLOBIN A1C Lab Results  Component Value Date   HGBA1C 6.4* 11/29/2010   MPG 137* 11/29/2010    Cardiac Panel (last 3 results) No results for input(s):  CKTOTAL, CKMB, TROPONINI, RELINDX in the last 8760 hours.  Lab Results  Component Value Date   TROPONINI <0.30 08/21/2013     TSH  Recent Labs  11/18/13 1413 02/17/14 1120  TSH 2.010 3.342    EKG 09/30/2014: Sinus bradycardia at a rate of 57 bpm, normal axis, normal intervals, no evidence of ischemia.  Normal QTc  interval.  Radiology: No results found.    FOLLOW UP PLANS AND APPOINTMENTS Discharge Instructions    Discharge patient    Complete by:  As directed             Medication List    STOP taking these medications        carvedilol 6.25 MG tablet  Commonly known as:  COREG      TAKE these medications        apixaban 5 MG Tabs tablet  Commonly known as:  ELIQUIS  Take 1 tablet (5 mg total) by mouth 2 (two) times daily.     HAIR/SKIN/NAILS/BIOTIN Tabs  Take 1 tablet by mouth daily.     beta carotene w/minerals tablet  Take 1 tablet by mouth daily.     CRANBERRY PO  Take 2 capsules by mouth daily with lunch.     CRESTOR 20 MG tablet  Generic drug:  rosuvastatin  TAKE 1 TABLET BY MOUTH DAILY     dofetilide 125 MCG capsule  Commonly known as:  TIKOSYN  Take 1 capsule (125 mcg total) by mouth 2 (two) times daily.     furosemide 20 MG tablet  Commonly known as:  LASIX  Take 20 mg by mouth daily.     multivitamin with minerals Tabs tablet  Take 1 tablet by mouth daily.     naproxen sodium 220 MG tablet  Commonly known as:  ANAPROX  Take 440 mg by mouth daily as needed (pain). ALEVE     spironolactone 25 MG tablet  Commonly known as:  ALDACTONE  TAKE 1 TABLET BY MOUTH EVERY DAY.     SYSTANE OP  Place 1 drop into both eyes daily as needed (itching/ soreness in corner of eyes).     Vitamin D3 2000 UNITS Tabs  Take 2,000 Units by mouth daily.     zolpidem 5 MG tablet  Commonly known as:  AMBIEN  Take 1 tablet (5 mg total) by mouth at bedtime as needed for sleep (Do not take more than 2-3 times per week).           Follow-up Information    Follow up with [name], MD On 10/12/2014.   Specialty:  Cardiology   Why:  at 1:45pm   Contact information:   [address] [place] [address] [phone]        Rachel Bo, NP-C 09/30/2014, [facility] Pager: [phone] Office: [phone]

## 2014-09-30 NOTE — Discharge Instructions (Signed)
Information on my medicine - ELIQUIS (apixaban)  This medication education was reviewed with me or my healthcare representative as part of my discharge preparation.  The pharmacist that spoke with me during my hospital stay was:  Romona Curls, [facility]  Why was Eliquis prescribed for you? Eliquis was prescribed for you to reduce the risk of a blood clot forming that can cause a stroke if you have a medical condition called atrial fibrillation (a type of irregular heartbeat).  What do You need to know about Eliquis ? Take your Eliquis TWICE DAILY - one tablet in the morning and one tablet in the evening with or without food. If you have difficulty swallowing the tablet whole please discuss with your pharmacist how to take the medication safely.  Take Eliquis exactly as prescribed by your doctor and DO NOT stop taking Eliquis without talking to the doctor who prescribed the medication.  Stopping may increase your risk of developing a stroke.  Refill your prescription before you run out.  After discharge, you should have regular check-up appointments with your healthcare provider that is prescribing your Eliquis.  In the future your dose may need to be changed if your kidney function or weight changes by a significant amount or as you get older.  What do you do if you miss a dose? If you miss a dose, take it as soon as you remember on the same day and resume taking twice daily.  Do not take more than one dose of ELIQUIS at the same time to make up a missed dose.  Important Safety Information A possible side effect of Eliquis is bleeding. You should call your healthcare provider right away if you experience any of the following: ? Bleeding from an injury or your nose that does not stop. ? Unusual colored urine (red or dark brown) or unusual colored stools (red or black). ? Unusual bruising for unknown reasons. ? A serious fall or if you hit your head (even if there is no bleeding).  Some  medicines may interact with Eliquis and might increase your risk of bleeding or clotting while on Eliquis. To help avoid this, consult your healthcare provider or pharmacist prior to using any new prescription or non-prescription medications, including herbals, vitamins, non-steroidal anti-inflammatory drugs (NSAIDs) and supplements.  This website has more information on Eliquis (apixaban): http://www.eliquis.com/eliquis/home  Atrial Fibrillation Atrial fibrillation is a type of irregular heart rhythm (arrhythmia). During atrial fibrillation, the upper chambers of the heart (atria) quiver continuously in a chaotic pattern. This causes an irregular and often rapid heart rate.  Atrial fibrillation is the result of the heart becoming overloaded with disorganized signals that tell it to beat. These signals are normally released one at a time by a part of the right atrium called the sinoatrial node. They then travel from the atria to the lower chambers of the heart (ventricles), causing the atria and ventricles to contract and pump blood as they pass. In atrial fibrillation, parts of the atria outside of the sinoatrial node also release these signals. This results in two problems. First, the atria receive so many signals that they do not have time to fully contract. Second, the ventricles, which can only receive one signal at a time, beat irregularly and out of rhythm with the atria.  There are three types of atrial fibrillation:   Paroxysmal. Paroxysmal atrial fibrillation starts suddenly and stops on its own within a week.  Persistent. Persistent atrial fibrillation lasts for more than a  week. It may stop on its own or with treatment.  Permanent. Permanent atrial fibrillation does not go away. Episodes of atrial fibrillation may lead to permanent atrial fibrillation. Atrial fibrillation can prevent your heart from pumping blood normally. It increases your risk of stroke and can lead to heart failure.    CAUSES   Heart conditions, including a heart attack, heart failure, coronary artery disease, and heart valve conditions.   Inflammation of the sac that surrounds the heart (pericarditis).  Blockage of an artery in the lungs (pulmonary embolism).  Pneumonia or other infections.  Chronic lung disease.  Thyroid problems, especially if the thyroid is overactive (hyperthyroidism).  Caffeine, excessive alcohol use, and use of some illegal drugs.   Use of some medicines, including certain decongestants and diet pills.  Heart surgery.   Birth defects.  Sometimes, no cause can be found. When this happens, the atrial fibrillation is called lone atrial fibrillation. The risk of complications from atrial fibrillation increases if you have lone atrial fibrillation and you are age 12 years or older. RISK FACTORS  Heart failure.  Coronary artery disease.  Diabetes mellitus.   High blood pressure (hypertension).   Obesity.   Other arrhythmias.   Increased age. SIGNS AND SYMPTOMS   A feeling that your heart is beating rapidly or irregularly.   A feeling of discomfort or pain in your chest.   Shortness of breath.   Sudden light-headedness or weakness.   Getting tired easily when exercising.   Urinating more often than normal (mainly when atrial fibrillation first begins).  In paroxysmal atrial fibrillation, symptoms may start and suddenly stop. DIAGNOSIS  Your health care provider may be able to detect atrial fibrillation when taking your pulse. Your health care provider may have you take a test called an ambulatory electrocardiogram (ECG). An ECG records your heartbeat patterns over a 24-hour period. You may also have other tests, such as:  Transthoracic echocardiogram (TTE). During echocardiography, sound waves are used to evaluate how blood flows through your heart.  Transesophageal echocardiogram (TEE).  Stress test. There is more than one type of stress  test. If a stress test is needed, ask your health care provider about which type is best for you.  Chest X-ray exam.  Blood tests.  Computed tomography (CT). TREATMENT  Treatment may include:  Treating any underlying conditions. For example, if you have an overactive thyroid, treating the condition may correct atrial fibrillation.  Taking medicine. Medicines may be given to control a rapid heart rate or to prevent blood clots, heart failure, or a stroke.  Having a procedure to correct the rhythm of the heart:  Electrical cardioversion. During electrical cardioversion, a controlled, low-energy shock is delivered to the heart through your skin. If you have chest pain, very low blood pressure, or sudden heart failure, this procedure may need to be done as an emergency.  Catheter ablation. During this procedure, heart tissues that send the signals that cause atrial fibrillation are destroyed.  Surgical ablation. During this surgery, thin lines of heart tissue that carry the abnormal signals are destroyed. This procedure can either be an open-heart surgery or a minimally invasive surgery. With the minimally invasive surgery, small cuts are made to access the heart instead of a large opening.  Pulmonary venous isolation. During this surgery, tissue around the veins that carry blood from the lungs (pulmonary veins) is destroyed. This tissue is thought to carry the abnormal signals. HOME CARE INSTRUCTIONS   Take medicines only as directed  by your health care provider. Some medicines can make atrial fibrillation worse or recur.  If blood thinners were prescribed by your health care provider, take them exactly as directed. Too much blood-thinning medicine can cause bleeding. If you take too little, you will not have the needed protection against stroke and other problems.  Perform blood tests at home if directed by your health care provider. Perform blood tests exactly as directed.  Quit smoking  if you smoke.  Do not drink alcohol.  Do not drink caffeinated beverages such as coffee, soda, and some teas. You may drink decaffeinated coffee, soda, or tea.   Maintain a healthy weight.Do not use diet pills unless your health care provider approves. They may make heart problems worse.   Follow diet instructions as directed by your health care provider.  Exercise regularly as directed by your health care provider.  Keep all follow-up visits as directed by your health care provider. This is important. PREVENTION  The following substances can cause atrial fibrillation to recur:   Caffeinated beverages.  Alcohol.  Certain medicines, especially those used for breathing problems.  Certain herbs and herbal medicines, such as those containing ephedra or ginseng.  Illegal drugs, such as cocaine and amphetamines. Sometimes medicines are given to prevent atrial fibrillation from recurring. Proper treatment of any underlying condition is also important in helping prevent recurrence.  SEEK MEDICAL CARE IF:  You notice a change in the rate, rhythm, or strength of your heartbeat.  You suddenly begin urinating more frequently.  You tire more easily when exerting yourself or exercising. SEEK IMMEDIATE MEDICAL CARE IF:   You have chest pain, abdominal pain, sweating, or weakness.  You feel nauseous.  You have shortness of breath.  You suddenly have swollen feet and ankles.  You feel dizzy.  Your face or limbs feel numb or weak.  You have a change in your vision or speech. MAKE SURE YOU:   Understand these instructions.  Will watch your condition.  Will get help right away if you are not doing well or get worse. Document Released: 01/22/2005 Document Revised: 06/08/2013 Document Reviewed: 03/04/2012 [facility] Patient Information 2015 [place], [place]. This information is not intended to replace advice given to you by your health care provider. Make sure you discuss any  questions you have with your health care provider.

## 2014-10-05 ENCOUNTER — Telehealth: Payer: Self-pay | Admitting: Family Medicine

## 2014-10-05 NOTE — Telephone Encounter (Signed)
Will forward to MD. Jazmin Hartsell,CMA  

## 2014-10-05 NOTE — Telephone Encounter (Signed)
[place] [facility] wanted dr Brooke Dare to know pt was discharged from the hospital

## 2014-10-12 DIAGNOSIS — I480 Paroxysmal atrial fibrillation: Secondary | ICD-10-CM | POA: Diagnosis not present

## 2014-10-12 DIAGNOSIS — I2510 Atherosclerotic heart disease of native coronary artery without angina pectoris: Secondary | ICD-10-CM | POA: Diagnosis not present

## 2014-10-12 DIAGNOSIS — Z5181 Encounter for therapeutic drug level monitoring: Secondary | ICD-10-CM | POA: Diagnosis not present

## 2014-10-21 ENCOUNTER — Telehealth: Payer: Self-pay | Admitting: Family Medicine

## 2014-10-21 DIAGNOSIS — H6506 Acute serous otitis media, recurrent, bilateral: Secondary | ICD-10-CM

## 2014-10-21 NOTE — Telephone Encounter (Signed)
Needs referral to Dr Si Raider dr. She needs to see him because her ear is draining and bloody. [phone] Please let pt know when this is done

## 2014-10-21 NOTE — Telephone Encounter (Signed)
I put in the referral  Thanks  [place]

## 2014-10-21 NOTE — Telephone Encounter (Signed)
Will forward to MD. Jazmin Hartsell,CMA  

## 2014-10-25 ENCOUNTER — Encounter: Payer: Self-pay | Admitting: Family Medicine

## 2014-10-25 ENCOUNTER — Ambulatory Visit (INDEPENDENT_AMBULATORY_CARE_PROVIDER_SITE_OTHER): Payer: Medicare Other | Admitting: Family Medicine

## 2014-10-25 VITALS — BP 172/88 | HR 68 | Temp 98.0°F | Ht 63.5 in | Wt 158.6 lb

## 2014-10-25 DIAGNOSIS — Z23 Encounter for immunization: Secondary | ICD-10-CM

## 2014-10-25 DIAGNOSIS — H6506 Acute serous otitis media, recurrent, bilateral: Secondary | ICD-10-CM | POA: Diagnosis not present

## 2014-10-25 MED ORDER — CIPROFLOXACIN-DEXAMETHASONE 0.3-0.1 % OT SUSP: 4.0000 [drp] | Freq: Two times a day (BID) | OTIC | Status: DC

## 2014-10-25 MED ORDER — AMOXICILLIN-POT CLAVULANATE 875-125 MG PO TABS: 1.0000 | ORAL_TABLET | Freq: Two times a day (BID) | ORAL | Status: DC

## 2014-10-25 NOTE — Assessment & Plan Note (Signed)
Exam today consistent with otitis media with perforated TMs. Will treat with ciprodex drops for 7 days and additionally with treat systemically with augmentin (given history of recurrent ear infections) for 7 days. Patient instructed to use rolled up tissue paper to wick away pus and drainage before placing drops. No red flag signs or symptoms. Patient instructed to return to care if symptoms not improving or worsening over the next 5-7 days.

## 2014-10-25 NOTE — Patient Instructions (Signed)
Thank you for coming to the clinic today. It was nice seeing you.  For your ear infection, we will give you antibiotic drops today. Please use a tissue to remove as much pus as possible, then place 4 drops into each ear daily for the next 7 days. We will also give you an oral antibiotic called augmentin. Please take 1 pill twice daily for the next 7 days.  Please return if your symptoms are not improving or are getting worse.  Take Care,  Dr Jerline Pain

## 2014-10-25 NOTE — Progress Notes (Signed)
Subjective:  Jodi [name] is a 81 y.o. female who presents to the [facility] today with a chief complaint of bilateral ear drainage.   HPI:  Bilateral Ear Drainage Patient reports drainage from her ears for the past 2 weeks. States that the drainage has been alternating between her left and right ears. Drainage was initially "yellow pus," but has since transitioned to a blood-like appearance over the past several days. Patient states that she has placed cotton swabs in her ears and they have been filled with blood. Patient reports that she tried using some left over ear drops, however did not noticed much of a difference. Patient has had similar symptoms in the past, and was given ear drops.   No pain. No fevers or chills. No worsened hearing.   Last 2 weeks. Alternating ears. Looks like yellow pus. Progressed to looking like blood. Used cotton swabs, filled with blood. Tried using ear drops. Might have been out of date. No pain. No fevers or chills. No worsened hearing.   ROS: Per HPI  PMH:  The following were reviewed and entered/updated in epic: Past Medical History  Diagnosis Date  . Coronary artery disease   . Hypertension   . Heart murmur   . Aortic stenosis   . CHF (congestive heart failure)   . Shortness of breath   . Renal mass 12/06/2010    CT Abdomen 11-27-10 upper pole region of the right kidney which is suspicious for solid lesion, measuring 1.9 x 1.3 cm. This lesion is concerning for renal cell carcinoma given the solid appearance.  Following with Dr [name].  Renal bx was benign    . Splenic infarct 12/06/2010  . Hyperlipidemia   . Myocardial infarction 1995; 2003  . Stroke 2003    "when I had heart surgery"; denies residual on 09/28/2014  . TIA (transient ischemic attack) "several"  . Arthritis     "maybe in my fingers and toes" (09/28/2014)  . Chronic renal insufficiency, stage III (moderate)     [name] 09/27/2014  . Renal cell carcinoma 09/23/2012  . Macular  degeneration, left eye    Patient Active Problem List   Diagnosis Date Noted  . Left renal mass   . Atrial fibrillation 08/20/2013  . Renal cell carcinoma 09/23/2012  . Renal insufficiency 02/20/2012  . Aortic stenosis 12/06/2010  . SEBORRHEA 11/10/2007  . INSOMNIA 06/09/2007  . Otitis media 06/04/2006  . HYPERCHOLESTEROLEMIA 04/04/2006  . HYPERTENSION, BENIGN SYSTEMIC 04/04/2006  . CORONARY, ARTERIOSCLEROSIS 04/04/2006  . CHF - EJECTION FRACTION < 50% 04/04/2006  . OSTEOARTHRITIS, MULTI SITES 04/04/2006   Past Surgical History  Procedure Laterality Date  . Cataract extraction w/ intraocular lens  implant, bilateral Bilateral ~ 2013  . Cardiac catheterization    . Cardioversion N/A 10/20/2013    Procedure: CARDIOVERSION;  Surgeon: Laverda Page, MD;  Location: [facility] ENDOSCOPY;  Service: Cardiovascular;  Laterality: N/A;  H&P in file  . Percutaneous needle biopsy of renal lesion  ?2014  . Left and right heart catheterization with coronary angiogram N/A 11/24/2013    Procedure: LEFT AND RIGHT HEART CATHETERIZATION WITH CORONARY ANGIOGRAM;  Surgeon: Laverda Page, MD;  Location: [facility] CATH LAB;  Service: Cardiovascular;  Laterality: N/A;  . Cardioversion N/A 05/18/2014    Procedure: CARDIOVERSION;  Surgeon: [name], MD;  Location: [place];  Service: Cardiovascular;  Laterality: N/A;  . Total abdominal hysterectomy  1990's    "both ovaries were full of little tiny sores"  .  Tympanoplasty Bilateral     "had holes in them; still have holes in them"  . Coronary artery bypass graft  1995 and 2003  . Coronary angioplasty       Objective:  Physical Exam: BP 172/88 mmHg  Pulse 68  Temp(Src) 98 F (36.7 C) (Oral)  Ht 5' 3.5" (1.613 m)  Wt 158 lb 9 oz (71.923 kg)  BMI 27.64 kg/m2  Gen: NAD, resting comfortably HEENT:  - Left TM sclerotic with perforation noted. Pus noted behind TM. No active drainage or bleeding noted. No mastoid tenderness.  - Significant amount of pus  noted in right external ear canal. No bleeding noted.  CV: RRR with no murmurs appreciated Lungs: NWOB, CTAB with no crackles, wheezes, or rhonchi MSK: no edema, cyanosis, or clubbing noted Skin: warm, dry Neuro: grossly normal, moves all extremities Psych: Normal affect and thought content   Assessment/Plan:  Otitis media Exam today consistent with otitis media with perforated TMs. Will treat with ciprodex drops for 7 days and additionally with treat systemically with augmentin (given history of recurrent ear infections) for 7 days. Patient instructed to use rolled up tissue paper to wick away pus and drainage before placing drops. No red flag signs or symptoms. Patient instructed to return to care if symptoms not improving or worsening over the next 5-7 days.    Algis Greenhouse. Jerline Pain, [place] Resident PGY-2 10/25/2014 11:02 AM

## 2014-12-15 ENCOUNTER — Other Ambulatory Visit: Payer: Self-pay | Admitting: Family Medicine

## 2014-12-20 DIAGNOSIS — Z5181 Encounter for therapeutic drug level monitoring: Secondary | ICD-10-CM | POA: Diagnosis not present

## 2014-12-22 DIAGNOSIS — I480 Paroxysmal atrial fibrillation: Secondary | ICD-10-CM | POA: Diagnosis not present

## 2015-02-08 ENCOUNTER — Telehealth: Payer: Self-pay | Admitting: Family Medicine

## 2015-02-08 NOTE — Telephone Encounter (Signed)
Spoke with patient and informed her that I would mail a copy of medicaid accepting dentists.  She voiced understanding. Jazmin Hartsell,CMA

## 2015-02-08 NOTE — Telephone Encounter (Signed)
Would like a list of dentists that take Medicare and Medicaid. Her current dentist is no longer in the network Please advise

## 2015-02-10 DIAGNOSIS — H353221 Exudative age-related macular degeneration, left eye, with active choroidal neovascularization: Secondary | ICD-10-CM | POA: Diagnosis not present

## 2015-02-10 DIAGNOSIS — H353130 Nonexudative age-related macular degeneration, bilateral, stage unspecified: Secondary | ICD-10-CM | POA: Diagnosis not present

## 2015-02-10 DIAGNOSIS — H35373 Puckering of macula, bilateral: Secondary | ICD-10-CM | POA: Diagnosis not present

## 2015-02-10 DIAGNOSIS — H26493 Other secondary cataract, bilateral: Secondary | ICD-10-CM | POA: Diagnosis not present

## 2015-02-10 DIAGNOSIS — H35363 Drusen (degenerative) of macula, bilateral: Secondary | ICD-10-CM | POA: Diagnosis not present

## 2015-02-14 DIAGNOSIS — H353221 Exudative age-related macular degeneration, left eye, with active choroidal neovascularization: Secondary | ICD-10-CM | POA: Diagnosis not present

## 2015-03-04 ENCOUNTER — Emergency Department (INDEPENDENT_AMBULATORY_CARE_PROVIDER_SITE_OTHER): Payer: Medicare Other

## 2015-03-04 ENCOUNTER — Inpatient Hospital Stay (HOSPITAL_COMMUNITY)
Admission: EM | Admit: 2015-03-04 | Discharge: 2015-03-05 | DRG: 291 | Disposition: A | Discharge status: Home / Self Care | Payer: Medicare Other | Source: Ambulatory Visit | Attending: Family Medicine | Admitting: Family Medicine

## 2015-03-04 ENCOUNTER — Encounter (HOSPITAL_COMMUNITY): Payer: Self-pay | Admitting: Emergency Medicine

## 2015-03-04 ENCOUNTER — Emergency Department (INDEPENDENT_AMBULATORY_CARE_PROVIDER_SITE_OTHER)
Admission: EM | Admit: 2015-03-04 | Discharge: 2015-03-04 | Disposition: A | Discharge status: Nursing Home (Includes ICF) | Payer: Medicare Other | Source: Home / Self Care | Attending: Family Medicine | Admitting: Family Medicine

## 2015-03-04 ENCOUNTER — Encounter (HOSPITAL_COMMUNITY): Payer: Self-pay | Admitting: *Deleted

## 2015-03-04 ENCOUNTER — Telehealth: Payer: Self-pay | Admitting: Family Medicine

## 2015-03-04 DIAGNOSIS — I272 Other secondary pulmonary hypertension: Secondary | ICD-10-CM | POA: Diagnosis present

## 2015-03-04 DIAGNOSIS — Z9861 Coronary angioplasty status: Secondary | ICD-10-CM | POA: Diagnosis not present

## 2015-03-04 DIAGNOSIS — Z8673 Personal history of transient ischemic attack (TIA), and cerebral infarction without residual deficits: Secondary | ICD-10-CM | POA: Diagnosis not present

## 2015-03-04 DIAGNOSIS — Z7901 Long term (current) use of anticoagulants: Secondary | ICD-10-CM

## 2015-03-04 DIAGNOSIS — R05 Cough: Secondary | ICD-10-CM | POA: Diagnosis not present

## 2015-03-04 DIAGNOSIS — Z9841 Cataract extraction status, right eye: Secondary | ICD-10-CM | POA: Diagnosis not present

## 2015-03-04 DIAGNOSIS — G4700 Insomnia, unspecified: Secondary | ICD-10-CM | POA: Diagnosis not present

## 2015-03-04 DIAGNOSIS — I5042 Chronic combined systolic (congestive) and diastolic (congestive) heart failure: Secondary | ICD-10-CM | POA: Diagnosis present

## 2015-03-04 DIAGNOSIS — N2889 Other specified disorders of kidney and ureter: Secondary | ICD-10-CM | POA: Diagnosis not present

## 2015-03-04 DIAGNOSIS — Z9842 Cataract extraction status, left eye: Secondary | ICD-10-CM

## 2015-03-04 DIAGNOSIS — I509 Heart failure, unspecified: Secondary | ICD-10-CM | POA: Diagnosis not present

## 2015-03-04 DIAGNOSIS — E785 Hyperlipidemia, unspecified: Secondary | ICD-10-CM | POA: Diagnosis present

## 2015-03-04 DIAGNOSIS — N183 Chronic kidney disease, stage 3 unspecified: Secondary | ICD-10-CM | POA: Diagnosis present

## 2015-03-04 DIAGNOSIS — I5021 Acute systolic (congestive) heart failure: Secondary | ICD-10-CM

## 2015-03-04 DIAGNOSIS — I130 Hypertensive heart and chronic kidney disease with heart failure and stage 1 through stage 4 chronic kidney disease, or unspecified chronic kidney disease: Principal | ICD-10-CM | POA: Diagnosis present

## 2015-03-04 DIAGNOSIS — I252 Old myocardial infarction: Secondary | ICD-10-CM

## 2015-03-04 DIAGNOSIS — I080 Rheumatic disorders of both mitral and aortic valves: Secondary | ICD-10-CM | POA: Diagnosis present

## 2015-03-04 DIAGNOSIS — I110 Hypertensive heart disease with heart failure: Secondary | ICD-10-CM | POA: Diagnosis not present

## 2015-03-04 DIAGNOSIS — Z951 Presence of aortocoronary bypass graft: Secondary | ICD-10-CM | POA: Diagnosis not present

## 2015-03-04 DIAGNOSIS — Z961 Presence of intraocular lens: Secondary | ICD-10-CM | POA: Diagnosis not present

## 2015-03-04 DIAGNOSIS — R0600 Dyspnea, unspecified: Secondary | ICD-10-CM | POA: Diagnosis present

## 2015-03-04 DIAGNOSIS — I350 Nonrheumatic aortic (valve) stenosis: Secondary | ICD-10-CM

## 2015-03-04 DIAGNOSIS — I5043 Acute on chronic combined systolic (congestive) and diastolic (congestive) heart failure: Secondary | ICD-10-CM | POA: Diagnosis not present

## 2015-03-04 DIAGNOSIS — R0602 Shortness of breath: Secondary | ICD-10-CM | POA: Diagnosis not present

## 2015-03-04 DIAGNOSIS — R001 Bradycardia, unspecified: Secondary | ICD-10-CM | POA: Diagnosis present

## 2015-03-04 DIAGNOSIS — I2510 Atherosclerotic heart disease of native coronary artery without angina pectoris: Secondary | ICD-10-CM | POA: Diagnosis not present

## 2015-03-04 DIAGNOSIS — I480 Paroxysmal atrial fibrillation: Secondary | ICD-10-CM | POA: Diagnosis not present

## 2015-03-04 DIAGNOSIS — I5041 Acute combined systolic (congestive) and diastolic (congestive) heart failure: Secondary | ICD-10-CM | POA: Diagnosis present

## 2015-03-04 HISTORY — DX: Bradycardia, unspecified: R00.1

## 2015-03-04 HISTORY — DX: Paroxysmal atrial fibrillation: I48.0

## 2015-03-04 LAB — BASIC METABOLIC PANEL
Anion gap: 13 (ref 5–15)
BUN: 17 mg/dL (ref 6–20)
CO2: 24 mmol/L (ref 22–32)
Calcium: 10.2 mg/dL (ref 8.9–10.3)
Chloride: 100 mmol/L — ABNORMAL LOW (ref 101–111)
Creatinine, Ser: 1.37 mg/dL — ABNORMAL HIGH (ref 0.44–1.00)
GFR calc Af Amer: 41 mL/min — ABNORMAL LOW (ref >60)
GFR calc non Af Amer: 35 mL/min — ABNORMAL LOW (ref >60)
Glucose, Bld: 117 mg/dL — ABNORMAL HIGH (ref 65–99)
Potassium: 4 mmol/L (ref 3.5–5.1)
Sodium: 137 mmol/L (ref 135–145)

## 2015-03-04 LAB — BRAIN NATRIURETIC PEPTIDE: B Natriuretic Peptide: 529.3 pg/mL — ABNORMAL HIGH (ref 0.0–100.0)

## 2015-03-04 LAB — CBC
HCT: 40.1 % (ref 36.0–46.0)
Hemoglobin: 13.6 g/dL (ref 12.0–15.0)
MCH: 30.5 pg (ref 26.0–34.0)
MCHC: 33.9 g/dL (ref 30.0–36.0)
MCV: 89.9 fL (ref 78.0–100.0)
Platelets: 218 10*3/uL (ref 150–400)
RBC: 4.46 MIL/uL (ref 3.87–5.11)
RDW: 13.5 % (ref 11.5–15.5)
WBC: 8.9 10*3/uL (ref 4.0–10.5)

## 2015-03-04 LAB — I-STAT TROPONIN, ED: Troponin i, poc: 0.02 ng/mL (ref 0.00–0.08)

## 2015-03-04 MED ORDER — FUROSEMIDE 10 MG/ML IJ SOLN
40.0000 mg | Freq: Once | INTRAMUSCULAR | Status: AC
  Administered 2015-03-04: 40 mg via INTRAVENOUS
  Filled 2015-03-04: qty 4

## 2015-03-04 NOTE — Telephone Encounter (Signed)
Spoke with patient and she is aware that her pcp isn't here.  Advised her that she would need an appt with [place] or the urgent care.  She has been taking OTC cough medication with no relief.  She will check with son to see if he can take her to [facility] urgent care.  Jodi [name],CMA

## 2015-03-04 NOTE — ED Provider Notes (Addendum)
CSN: [id number]     Arrival date & time 03/04/15  1736 History   None    Chief Complaint  Patient presents with  . Cough   (Consider location/radiation/quality/duration/timing/severity/associated sxs/prior Treatment) Patient is a 82 y.o. female presenting with cough. The history is provided by the patient.  Cough Cough characteristics:  Productive and hacking Sputum characteristics:  Yellow Severity:  Moderate Onset quality:  Gradual Duration:  5 days Progression:  Unchanged Chronicity:  New Smoker: no   Relieved by:  None tried Worsened by:  Nothing tried Ineffective treatments:  None tried Associated symptoms: rhinorrhea, shortness of breath, sinus congestion and wheezing   Associated symptoms: no fever     Past Medical History  Diagnosis Date  . Coronary artery disease   . Hypertension   . Heart murmur   . Aortic stenosis   . CHF (congestive heart failure) ([place])   . Shortness of breath   . Renal mass 12/06/2010    CT Abdomen 11-27-10 upper pole region of the right kidney which is suspicious for solid lesion, measuring 1.9 x 1.3 cm. This lesion is concerning for renal cell carcinoma given the solid appearance.  Following with Dr [name].  Renal bx was benign    . Splenic infarct 12/06/2010  . Hyperlipidemia   . Myocardial infarction [facility]) 1995; 2003  . Stroke [facility]) 2003    "when I had heart surgery"; denies residual on 09/28/2014  . TIA (transient ischemic attack) "several"  . Arthritis     "maybe in my fingers and toes" (09/28/2014)  . Chronic renal insufficiency, stage III (moderate)     [name] 09/27/2014  . Renal cell carcinoma ([place]) 09/23/2012  . Macular degeneration, left eye    Past Surgical History  Procedure Laterality Date  . Cataract extraction w/ intraocular lens  implant, bilateral Bilateral ~ 2013  . Cardiac catheterization    . Cardioversion N/A 10/20/2013    Procedure: CARDIOVERSION;  Surgeon: Laverda Page, MD;  Location: [facility] ENDOSCOPY;  Service:  Cardiovascular;  Laterality: N/A;  H&P in file  . Percutaneous needle biopsy of renal lesion  ?2014  . Left and right heart catheterization with coronary angiogram N/A 11/24/2013    Procedure: LEFT AND RIGHT HEART CATHETERIZATION WITH CORONARY ANGIOGRAM;  Surgeon: Laverda Page, MD;  Location: [facility] CATH LAB;  Service: Cardiovascular;  Laterality: N/A;  . Cardioversion N/A 05/18/2014    Procedure: CARDIOVERSION;  Surgeon: [name], MD;  Location: [place];  Service: Cardiovascular;  Laterality: N/A;  . Total abdominal hysterectomy  1990's    "both ovaries were full of little tiny sores"  . Tympanoplasty Bilateral     "had holes in them; still have holes in them"  . Coronary artery bypass graft  1995 and 2003  . Coronary angioplasty     Family History  Problem Relation Age of Onset  . Coronary artery disease Mother   . Coronary artery disease Brother   . Stroke Mother   . Heart attack Brother   . Heart attack Son   . Heart attack Daughter   . Heart attack Sister    Social History  Substance Use Topics  . Smoking status: Never Smoker   . Smokeless tobacco: Never Used  . Alcohol Use: No   OB History    No data available     Review of Systems  Constitutional: Negative.  Negative for fever.  HENT: Positive for congestion, postnasal drip and rhinorrhea.   Respiratory: Positive for cough, shortness of  breath and wheezing.   Cardiovascular: Negative.   Gastrointestinal: Negative.   Skin: Negative.   All other systems reviewed and are negative.   Allergies  Sulfamethoxazole-trimethoprim  Home Medications   Prior to Admission medications   Medication Sig Start Date End Date Taking? Authorizing Provider  amoxicillin-clavulanate (AUGMENTIN) 875-125 MG per tablet Take 1 tablet by mouth 2 (two) times daily. 10/25/14   Vivi Barrack, MD  apixaban (ELIQUIS) 5 MG TABS tablet Take 1 tablet (5 mg total) by mouth 2 (two) times daily. 08/21/13   [name], MD  beta carotene  w/minerals (OCUVITE) tablet Take 1 tablet by mouth daily.    Historical Provider, MD  Cholecalciferol (VITAMIN D3) 2000 UNITS TABS Take 2,000 Units by mouth daily.    Historical Provider, MD  ciprofloxacin-dexamethasone (CIPRODEX) otic suspension Place 4 drops into both ears 2 (two) times daily. 10/25/14   Vivi Barrack, MD  CRANBERRY PO Take 2 capsules by mouth daily with lunch.    Historical Provider, MD  CRESTOR 20 MG tablet TAKE 1 TABLET BY MOUTH DAILY Patient taking differently: TAKE 1 TABLET BY MOUTH DAILY AT BEDTIME 08/12/14   [name], MD  dofetilide (TIKOSYN) 125 MCG capsule Take 1 capsule (125 mcg total) by mouth 2 (two) times daily. 09/30/14   [name], MD  furosemide (LASIX) 20 MG tablet Take 20 mg by mouth daily.  12/08/13   Historical Provider, MD  Multiple Vitamin (MULTIVITAMIN WITH MINERALS) TABS tablet Take 1 tablet by mouth daily.    Historical Provider, MD  Multiple Vitamins-Minerals (HAIR/SKIN/NAILS/BIOTIN) TABS Take 1 tablet by mouth daily.    Historical Provider, MD  naproxen sodium (ANAPROX) 220 MG tablet Take 440 mg by mouth daily as needed (pain). ALEVE    Historical Provider, MD  Polyethyl Glycol-Propyl Glycol (SYSTANE OP) Place 1 drop into both eyes daily as needed (itching/ soreness in corner of eyes).    Historical Provider, MD  spironolactone (ALDACTONE) 25 MG tablet TAKE 1 TABLET BY MOUTH EVERY DAY. 09/13/14   [name], MD  zolpidem (AMBIEN) 5 MG tablet Take 1 tablet (5 mg total) by mouth at bedtime as needed for sleep (Do not take more than 2-3 times per week). 08/12/14   [name], MD   Meds Ordered and Administered this Visit  Medications - No data to display  BP 165/81 mmHg  Pulse 68  Temp(Src) 98.7 F (37.1 C) (Oral)  Resp 18  SpO2 94% No data found.   Physical Exam  Constitutional: She is oriented to person, place, and time. She appears well-developed and well-nourished.  HENT:  Right Ear: External ear normal.  Left Ear:  External ear normal.  Mouth/Throat: Oropharynx is clear and moist.  Eyes: Pupils are equal, round, and reactive to light.  Neck: Normal range of motion. Neck supple.  Cardiovascular: Regular rhythm, normal heart sounds and intact distal pulses.   Pulmonary/Chest: Effort normal. She has wheezes. She has rales.  Abdominal: There is no tenderness.  Lymphadenopathy:    She has no cervical adenopathy.  Neurological: She is alert and oriented to person, place, and time.  Skin: Skin is warm and dry.  Nursing note and vitals reviewed.   ED Course  Procedures (including critical care time)  Labs Review Labs Reviewed - No data to display  Imaging Review Dg Chest 2 View  03/04/2015  CLINICAL DATA:  Cough, wheezing, and shortness of breath for over a week. Previous history of cardiac bypass and bronchitis. EXAM:  CHEST  2 VIEW COMPARISON:  05/19/2014 FINDINGS: Postoperative changes in the mediastinum. Diffuse cardiac enlargement. Calcification in the mitral valve annulus. Calcified and tortuous aorta. Mild pulmonary vascular congestion. Interstitial pattern to the lungs may represent mild edema. Emphysematous changes in the lungs. No blunting of costophrenic angles. No pneumothorax. IMPRESSION: Cardiac enlargement with mild pulmonary vascular congestion and interstitial changes suggesting interstitial edema. Underlying pulmonary emphysematous change. Electronically Signed   By: Lucienne Capers M.D.   On: 03/04/2015 18:27   X-rays reviewed and report per radiologist.   Visual Acuity Review  Right Eye Distance:   Left Eye Distance:   Bilateral Distance:    Right Eye Near:   Left Eye Near:    Bilateral Near:         MDM   1. Acute systolic congestive heart failure ([place])    Sent for cardiac eval of cough and cxr abnlities.    Billy Fischer, MD 03/04/15 [id number]  Billy Fischer, MD 03/04/15 [phone]

## 2015-03-04 NOTE — Telephone Encounter (Signed)
Pt called and would like something called in for her cough. jw

## 2015-03-04 NOTE — ED Notes (Signed)
Pt sent from [facility] for cardiac work-up and abnormal chest x-ray.  C/o productive, hacking cough with yellow phlegm since Sunday.  Also reports rhinorrhea, mild sob, wheezing, and sinus congestion.  Denies edema.

## 2015-03-04 NOTE — ED Provider Notes (Signed)
CSN: [id number]     Arrival date & time 03/04/15  1936 History   First MD Initiated Contact with Patient 03/04/15 2143     Chief Complaint  Patient presents with  . Cough  . Shortness of Breath     (Consider location/radiation/quality/duration/timing/severity/associated sxs/prior Treatment) Patient is a 82 y.o. female presenting with cough and shortness of breath. The history is provided by the patient. No language interpreter was used.  Cough Cough characteristics:  Productive Severity:  Moderate Onset quality:  Gradual Duration:  1 week Timing:  Constant Progression:  Worsening Chronicity:  New Smoker: no   Context: not sick contacts   Relieved by:  Nothing Worsened by:  Nothing tried Ineffective treatments:  None tried Associated symptoms: shortness of breath   Associated symptoms: no chest pain   Risk factors: recent infection   Shortness of Breath Associated symptoms: cough   Associated symptoms: no chest pain    Pt complains of a cough for the past week.   Pt reports increasing shortness of breath.  Pt reports increased today.  Pt reports bad cough yesterday.  Pt reports drinking multiple bottles of water today.  Pt has a history of chf.  Past Medical History  Diagnosis Date  . Coronary artery disease   . Hypertension   . Heart murmur   . Aortic stenosis   . CHF (congestive heart failure) ([place])   . Shortness of breath   . Renal mass 12/06/2010    CT Abdomen 11-27-10 upper pole region of the right kidney which is suspicious for solid lesion, measuring 1.9 x 1.3 cm. This lesion is concerning for renal cell carcinoma given the solid appearance.  Following with Dr [name].  Renal bx was benign    . Splenic infarct 12/06/2010  . Hyperlipidemia   . Myocardial infarction [facility]) 1995; 2003  . Stroke [facility]) 2003    "when I had heart surgery"; denies residual on 09/28/2014  . TIA (transient ischemic attack) "several"  . Arthritis     "maybe in my fingers and toes"  (09/28/2014)  . Chronic renal insufficiency, stage III (moderate)     [name] 09/27/2014  . Renal cell carcinoma ([place]) 09/23/2012  . Macular degeneration, left eye    Past Surgical History  Procedure Laterality Date  . Cataract extraction w/ intraocular lens  implant, bilateral Bilateral ~ 2013  . Cardiac catheterization    . Cardioversion N/A 10/20/2013    Procedure: CARDIOVERSION;  Surgeon: Laverda Page, MD;  Location: [facility] ENDOSCOPY;  Service: Cardiovascular;  Laterality: N/A;  H&P in file  . Percutaneous needle biopsy of renal lesion  ?2014  . Left and right heart catheterization with coronary angiogram N/A 11/24/2013    Procedure: LEFT AND RIGHT HEART CATHETERIZATION WITH CORONARY ANGIOGRAM;  Surgeon: Laverda Page, MD;  Location: [facility] CATH LAB;  Service: Cardiovascular;  Laterality: N/A;  . Cardioversion N/A 05/18/2014    Procedure: CARDIOVERSION;  Surgeon: [name], MD;  Location: [place];  Service: Cardiovascular;  Laterality: N/A;  . Total abdominal hysterectomy  1990's    "both ovaries were full of little tiny sores"  . Tympanoplasty Bilateral     "had holes in them; still have holes in them"  . Coronary artery bypass graft  1995 and 2003  . Coronary angioplasty     Family History  Problem Relation Age of Onset  . Coronary artery disease Mother   . Coronary artery disease Brother   . Stroke Mother   . Heart attack  Brother   . Heart attack Son   . Heart attack Daughter   . Heart attack Sister    Social History  Substance Use Topics  . Smoking status: Never Smoker   . Smokeless tobacco: Never Used  . Alcohol Use: No   OB History    No data available     Review of Systems  Respiratory: Positive for cough and shortness of breath.   Cardiovascular: Negative for chest pain.  All other systems reviewed and are negative.     Allergies  Sulfamethoxazole-trimethoprim  Home Medications   Prior to Admission medications   Medication Sig Start Date End Date  Taking? Authorizing Provider  amLODipine (NORVASC) 10 MG tablet Take 10 mg by mouth daily. 12/22/14  Yes Historical Provider, MD  apixaban (ELIQUIS) 5 MG TABS tablet Take 1 tablet (5 mg total) by mouth 2 (two) times daily. 08/21/13  Yes [name], MD  beta carotene w/minerals (OCUVITE) tablet Take 1 tablet by mouth daily.   Yes Historical Provider, MD  Cholecalciferol (VITAMIN D3) 2000 UNITS TABS Take 2,000 Units by mouth daily.   Yes Historical Provider, MD  CRANBERRY PO Take 2 capsules by mouth daily with lunch.   Yes Historical Provider, MD  CRESTOR 20 MG tablet TAKE 1 TABLET BY MOUTH DAILY Patient taking differently: TAKE 1 TABLET BY MOUTH DAILY AT BEDTIME 08/12/14  Yes [name], MD  dofetilide (TIKOSYN) 125 MCG capsule Take 1 capsule (125 mcg total) by mouth 2 (two) times daily. 09/30/14  Yes [name], MD  furosemide (LASIX) 20 MG tablet Take 20 mg by mouth daily as needed for fluid or edema.  12/08/13  Yes Historical Provider, MD  Multiple Vitamin (MULTIVITAMIN WITH MINERALS) TABS tablet Take 1 tablet by mouth daily.   Yes Historical Provider, MD  naproxen sodium (ANAPROX) 220 MG tablet Take 440 mg by mouth daily as needed (pain). ALEVE   Yes Historical Provider, MD  Polyethyl Glycol-Propyl Glycol (SYSTANE OP) Place 1 drop into both eyes daily as needed (itching/ soreness in corner of eyes).   Yes Historical Provider, MD  spironolactone (ALDACTONE) 25 MG tablet TAKE 1 TABLET BY MOUTH EVERY DAY. 09/13/14  Yes [name], MD  zolpidem (AMBIEN) 5 MG tablet Take 1 tablet (5 mg total) by mouth at bedtime as needed for sleep (Do not take more than 2-3 times per week). 08/12/14  Yes [name], MD  amoxicillin-clavulanate (AUGMENTIN) 875-125 MG per tablet Take 1 tablet by mouth 2 (two) times daily. 10/25/14   Vivi Barrack, MD  ciprofloxacin-dexamethasone (CIPRODEX) otic suspension Place 4 drops into both ears 2 (two) times daily. 10/25/14   Vivi Barrack, MD   BP 182/82 mmHg   Pulse 62  Temp(Src) 98.2 F (36.8 C) (Oral)  Resp 19  Wt 73.211 kg  SpO2 92% Physical Exam  Constitutional: She is oriented to person, place, and time. She appears well-developed and well-nourished.  HENT:  Head: Normocephalic and atraumatic.  Right Ear: External ear normal.  Left Ear: External ear normal.  Nose: Nose normal.  Mouth/Throat: Oropharynx is clear and moist.  Eyes: Conjunctivae and EOM are normal. Pupils are equal, round, and reactive to light.  Neck: Normal range of motion.  Cardiovascular: Normal rate and normal heart sounds.   Pulmonary/Chest: Effort normal.  Abdominal: She exhibits no distension.  Musculoskeletal: Normal range of motion.  Neurological: She is alert and oriented to person, place, and time.  Skin: Skin is warm.  Psychiatric: She  has a normal mood and affect.  Nursing note and vitals reviewed.   ED Course  Procedures (including critical care time) Labs Review Labs Reviewed  BASIC METABOLIC PANEL - Abnormal; Notable for the following:    Chloride 100 (*)    Glucose, Bld 117 (*)    Creatinine, Ser 1.37 (*)    GFR calc non Af Amer 35 (*)    GFR calc Af Amer 41 (*)    All other components within normal limits  CBC  I-STAT TROPOININ, ED    Imaging Review Dg Chest 2 View  03/04/2015  CLINICAL DATA:  Cough, wheezing, and shortness of breath for over a week. Previous history of cardiac bypass and bronchitis. EXAM: CHEST  2 VIEW COMPARISON:  05/19/2014 FINDINGS: Postoperative changes in the mediastinum. Diffuse cardiac enlargement. Calcification in the mitral valve annulus. Calcified and tortuous aorta. Mild pulmonary vascular congestion. Interstitial pattern to the lungs may represent mild edema. Emphysematous changes in the lungs. No blunting of costophrenic angles. No pneumothorax. IMPRESSION: Cardiac enlargement with mild pulmonary vascular congestion and interstitial changes suggesting interstitial edema. Underlying pulmonary emphysematous  change. Electronically Signed   By: Lucienne Capers M.D.   On: 03/04/2015 18:27   I have personally reviewed and evaluated these images and lab results as part of my medical decision-making.   EKG Interpretation None      MDM Pt seen at Urgent care.  Pt has vascular congestion and interstitial edema.  Troponin normal.  I will give lasix Iv.   Pt sees [facility] Erin Hearing at [facility].  I will consult for admission   Final diagnoses:  Acute congestive heart failure, unspecified congestive heart failure type [facility])       Fransico Meadow, PA-C 03/04/15 [place], MD 03/06/15 [phone]

## 2015-03-04 NOTE — H&P (Signed)
[facility] Admission History and Physical Service Pager: [phone]  Patient name: Jodi [name] Medical record number: [id number] Date of birth: [birth year] Age: 82 y.o. Gender: female  Primary Care Provider: [name], MD Consultants: None Code Status: Full   Chief Complaint: cough   Assessment and Plan: Jodi [name] is a 82 y.o. female presenting with cough 2/2 to CHF exacerbation. PMH is significant for HTN, Renal cell carcinoma, HLD, HFrEF, CAD s/p CABG, paroxysmal afib on chronic anticoagulation and bradycardia.   Cough: Symptoms most likely suggestive of CHF exacerbation. Exacerbation associated with her increase in water intake over the past week. She does not take Lasix on a regular basis and only as needed. She has not taken any Lasix recently. Reports some orthopnea. She denies any chest pain. - Admit to telemetry, Dr. McDiarmid attending  - lasix IV, restart home lasix tomorrow  - strict in/outs  - ECHO ordered - Consider stopping amlodipine and starting an ACE inhibitor vs ARB  Paroxysmal Atrial Fibrillation:  Unable to tolerate amiodarone in the past secondary to bradycardia - continue Tikosyn   Severe aortic stenosis/Moderate PAH: Per chart review she is not a candidate for mitral valve repair along with aortic valve replacement. Observed on Cath 11/2013.  Denies any chest pain or loss consciousness - Continue apixaban  - telemetry   CAD s/p CABG/HTN/HLD: CABG in 1994. Stents in 2002. H/O Redo CABG 06/05/01. Lexiscan stress 12/15/10: Inferior, septal and distal anterior and apical scar. EF 47%. - continue amlodipine and spironolactone  - Continue Crestor  Renal cell carcinoma: last imaging 2012 showing solid lesion in right kideny upper pole that was suspicious for renal cell carcinoma. Upon char review(2012), she did not favor surgery   FEN/GI: saline lock, Heart healthy  Prophylaxis: Apixaban   Disposition:  admitted to FMTS for CHF exacerbation.   History of Present Illness:  Jodi [name] is a 82 y.o. female presenting with coughing. Symptoms started last Saturday where she is unable to control her cough. She has Lasix that she takes at home for lower extremity swelling but has had none of that. She started drinking more water to help resolve the cough. She drinks on average 4-5 bottles of water per day. She also been taking more cough syrup with no resolution of her cough. She endorses some orthopnea but no PND or extremity swelling. She denies any travel or increase in her salt intake. She denies any chest pain, fever, chills, abdominal pain, dysuria, or changes in her bowel movements.  Review Of Systems: Per HPI with the following additions: See HPI  Otherwise the remainder of the systems were negative.  Patient Active Problem List   Diagnosis Date Noted  . CHF (congestive heart failure) ([place]) 03/04/2015  . Left renal mass   . Atrial fibrillation ([place]) 08/20/2013  . Renal cell carcinoma ([place]) 09/23/2012  . Renal insufficiency 02/20/2012  . Aortic stenosis 12/06/2010  . SEBORRHEA 11/10/2007  . INSOMNIA 06/09/2007  . Otitis media 06/04/2006  . HYPERCHOLESTEROLEMIA 04/04/2006  . HYPERTENSION, BENIGN SYSTEMIC 04/04/2006  . CORONARY, ARTERIOSCLEROSIS 04/04/2006  . CHF - EJECTION FRACTION < 50% 04/04/2006  . OSTEOARTHRITIS, MULTI SITES 04/04/2006    Past Medical History: Past Medical History  Diagnosis Date  . Coronary artery disease   . Hypertension   . Heart murmur   . Aortic stenosis   . CHF (congestive heart failure) ([place])   . Shortness of breath   . Renal mass 12/06/2010  CT Abdomen 11-27-10 upper pole region of the right kidney which is suspicious for solid lesion, measuring 1.9 x 1.3 cm. This lesion is concerning for renal cell carcinoma given the solid appearance.  Following with Dr [name].  Renal bx was benign    . Splenic infarct 12/06/2010  . Hyperlipidemia   .  Myocardial infarction [facility]) 1995; 2003  . Stroke [facility]) 2003    "when I had heart surgery"; denies residual on 09/28/2014  . TIA (transient ischemic attack) "several"  . Arthritis     "maybe in my fingers and toes" (09/28/2014)  . Chronic renal insufficiency, stage III (moderate)     [name] 09/27/2014  . Renal cell carcinoma ([place]) 09/23/2012  . Macular degeneration, left eye     Past Surgical History: Past Surgical History  Procedure Laterality Date  . Cataract extraction w/ intraocular lens  implant, bilateral Bilateral ~ 2013  . Cardiac catheterization    . Cardioversion N/A 10/20/2013    Procedure: CARDIOVERSION;  Surgeon: Laverda Page, MD;  Location[facility] ENDOSCOPY;  Service: Cardiovascular;  Laterality: N/A;  H&P in file  . Percutaneous needle biopsy of renal lesion  ?2014  . Left and right heart catheterization with coronary angiogram N/A 11/24/2013    Procedure: LEFT AND RIGHT HEART CATHETERIZATION WITH CORONARY ANGIOGRAM;  Surgeon: Laverda Page, MD;  Location: [facility] CATH LAB;  Service: Cardiovascular;  Laterality: N/A;  . Cardioversion N/A 05/18/2014    Procedure: CARDIOVERSION;  Surgeon: [name], MD;  Location: [place];  Service: Cardiovascular;  Laterality: N/A;  . Total abdominal hysterectomy  1990's    "both ovaries were full of little tiny sores"  . Tympanoplasty Bilateral     "had holes in them; still have holes in them"  . Coronary artery bypass graft  1995 and 2003  . Coronary angioplasty      Social History: Social History  Substance Use Topics  . Smoking status: Never Smoker   . Smokeless tobacco: Never Used  . Alcohol Use: No   Additional social history: No tobacco, alcohol, or illicit drug use Please also refer to relevant sections of EMR.  Family History: Family History  Problem Relation Age of Onset  . Coronary artery disease Mother   . Coronary artery disease Brother   . Stroke Mother   . Heart attack Brother   . Heart attack Son   . Heart  attack Daughter   . Heart attack Sister    Allergies and Medications: Allergies  Allergen Reactions  . Sulfamethoxazole-Trimethoprim Other (See Comments)     shaking /chills   No current facility-administered medications on file prior to encounter.   Current Outpatient Prescriptions on File Prior to Encounter  Medication Sig Dispense Refill  . apixaban (ELIQUIS) 5 MG TABS tablet Take 1 tablet (5 mg total) by mouth 2 (two) times daily. 60 tablet 1  . beta carotene w/minerals (OCUVITE) tablet Take 1 tablet by mouth daily.    . Cholecalciferol (VITAMIN D3) 2000 UNITS TABS Take 2,000 Units by mouth daily.    Marland Kitchen CRANBERRY PO Take 2 capsules by mouth daily with lunch.    . CRESTOR 20 MG tablet TAKE 1 TABLET BY MOUTH DAILY (Patient taking differently: TAKE 1 TABLET BY MOUTH DAILY AT BEDTIME) 90 tablet 2  . dofetilide (TIKOSYN) 125 MCG capsule Take 1 capsule (125 mcg total) by mouth 2 (two) times daily. 14 capsule 0  . furosemide (LASIX) 20 MG tablet Take 20 mg by mouth daily as needed for  fluid or edema.   0  . Multiple Vitamin (MULTIVITAMIN WITH MINERALS) TABS tablet Take 1 tablet by mouth daily.    . naproxen sodium (ANAPROX) 220 MG tablet Take 440 mg by mouth daily as needed (pain). ALEVE    . Polyethyl Glycol-Propyl Glycol (SYSTANE OP) Place 1 drop into both eyes daily as needed (itching/ soreness in corner of eyes).    Marland Kitchen spironolactone (ALDACTONE) 25 MG tablet TAKE 1 TABLET BY MOUTH EVERY DAY. 30 tablet 6  . zolpidem (AMBIEN) 5 MG tablet Take 1 tablet (5 mg total) by mouth at bedtime as needed for sleep (Do not take more than 2-3 times per week). 25 tablet 3  . amoxicillin-clavulanate (AUGMENTIN) 875-125 MG per tablet Take 1 tablet by mouth 2 (two) times daily. 14 tablet 0  . ciprofloxacin-dexamethasone (CIPRODEX) otic suspension Place 4 drops into both ears 2 (two) times daily. 7.5 mL 0    Objective: BP 177/66 mmHg  Pulse 62  Temp(Src) 98.2 F (36.8 C) (Oral)  Resp 16  Wt 161 lb 6.4  oz (73.211 kg)  SpO2 93% Exam: General: No acute distress, elderly female Eyes: Clear conjunctiva, extraocular movements intact ENTM: Moist mucous membranes, Neck: Supple  Cardiovascular: Regular rate and rhythm, S1-S2, no murmurs, rubs, or gallops, no pitting edema Respiratory: Significant for wheezing and rails, normal effort, Abdomen: Soft, nontender, nondistended, positive bowel sounds, no rigidity or guarding MSK: Moves all extremities freely Skin: Well-healed surgical scars medial aspect of lower extremity bilaterally Neuro: No gross deficits Psych: Alert and oriented  Labs and Imaging: CBC BMET   Recent Labs Lab 03/04/15 2003  WBC 8.9  HGB 13.6  HCT 40.1  PLT 218    Recent Labs Lab 03/04/15 2003  NA 137  K 4.0  CL 100*  CO2 24  BUN 17  CREATININE 1.37*  GLUCOSE 117*  CALCIUM 10.2     Dg Chest 2 View  03/04/2015  CLINICAL DATA:  Cough, wheezing, and shortness of breath for over a week. Previous history of cardiac bypass and bronchitis. EXAM: CHEST  2 VIEW COMPARISON:  05/19/2014 FINDINGS: Postoperative changes in the mediastinum. Diffuse cardiac enlargement. Calcification in the mitral valve annulus. Calcified and tortuous aorta. Mild pulmonary vascular congestion. Interstitial pattern to the lungs may represent mild edema. Emphysematous changes in the lungs. No blunting of costophrenic angles. No pneumothorax. IMPRESSION: Cardiac enlargement with mild pulmonary vascular congestion and interstitial changes suggesting interstitial edema. Underlying pulmonary emphysematous change. Electronically Signed   By: Lucienne Capers M.D.   On: 03/04/2015 18:27     Rosemarie Ax, MD 03/04/2015, 11:18 PM PGY-3, [place] Intern pager: [phone], text pages welcome

## 2015-03-04 NOTE — ED Notes (Addendum)
Pt    Reports    Symptoms  Of  Cough  Congested    Started  Out  As  A  Scratchy  Throat    Short   Of  Breath

## 2015-03-05 ENCOUNTER — Encounter (HOSPITAL_COMMUNITY): Payer: Self-pay | Admitting: Family Medicine

## 2015-03-05 DIAGNOSIS — N183 Chronic kidney disease, stage 3 (moderate): Secondary | ICD-10-CM | POA: Diagnosis present

## 2015-03-05 DIAGNOSIS — Z7901 Long term (current) use of anticoagulants: Secondary | ICD-10-CM | POA: Diagnosis not present

## 2015-03-05 DIAGNOSIS — Z8673 Personal history of transient ischemic attack (TIA), and cerebral infarction without residual deficits: Secondary | ICD-10-CM | POA: Diagnosis not present

## 2015-03-05 DIAGNOSIS — Z9861 Coronary angioplasty status: Secondary | ICD-10-CM | POA: Diagnosis not present

## 2015-03-05 DIAGNOSIS — R001 Bradycardia, unspecified: Secondary | ICD-10-CM | POA: Diagnosis present

## 2015-03-05 DIAGNOSIS — I5043 Acute on chronic combined systolic (congestive) and diastolic (congestive) heart failure: Secondary | ICD-10-CM | POA: Diagnosis present

## 2015-03-05 DIAGNOSIS — G4700 Insomnia, unspecified: Secondary | ICD-10-CM | POA: Diagnosis present

## 2015-03-05 DIAGNOSIS — Z961 Presence of intraocular lens: Secondary | ICD-10-CM | POA: Diagnosis present

## 2015-03-05 DIAGNOSIS — E785 Hyperlipidemia, unspecified: Secondary | ICD-10-CM | POA: Diagnosis present

## 2015-03-05 DIAGNOSIS — Z9842 Cataract extraction status, left eye: Secondary | ICD-10-CM | POA: Diagnosis not present

## 2015-03-05 DIAGNOSIS — N2889 Other specified disorders of kidney and ureter: Secondary | ICD-10-CM | POA: Diagnosis present

## 2015-03-05 DIAGNOSIS — I480 Paroxysmal atrial fibrillation: Secondary | ICD-10-CM | POA: Diagnosis present

## 2015-03-05 DIAGNOSIS — I130 Hypertensive heart and chronic kidney disease with heart failure and stage 1 through stage 4 chronic kidney disease, or unspecified chronic kidney disease: Secondary | ICD-10-CM | POA: Diagnosis present

## 2015-03-05 DIAGNOSIS — R0600 Dyspnea, unspecified: Secondary | ICD-10-CM | POA: Diagnosis present

## 2015-03-05 DIAGNOSIS — Z951 Presence of aortocoronary bypass graft: Secondary | ICD-10-CM | POA: Diagnosis not present

## 2015-03-05 DIAGNOSIS — I2510 Atherosclerotic heart disease of native coronary artery without angina pectoris: Secondary | ICD-10-CM | POA: Diagnosis present

## 2015-03-05 DIAGNOSIS — I080 Rheumatic disorders of both mitral and aortic valves: Secondary | ICD-10-CM | POA: Diagnosis present

## 2015-03-05 DIAGNOSIS — I252 Old myocardial infarction: Secondary | ICD-10-CM | POA: Diagnosis not present

## 2015-03-05 DIAGNOSIS — I272 Other secondary pulmonary hypertension: Secondary | ICD-10-CM | POA: Diagnosis present

## 2015-03-05 DIAGNOSIS — Z9841 Cataract extraction status, right eye: Secondary | ICD-10-CM | POA: Diagnosis not present

## 2015-03-05 DIAGNOSIS — R0602 Shortness of breath: Secondary | ICD-10-CM | POA: Diagnosis present

## 2015-03-05 LAB — BASIC METABOLIC PANEL
Anion gap: 11 (ref 5–15)
BUN: 17 mg/dL (ref 6–20)
CO2: 27 mmol/L (ref 22–32)
Calcium: 9.6 mg/dL (ref 8.9–10.3)
Chloride: 99 mmol/L — ABNORMAL LOW (ref 101–111)
Creatinine, Ser: 1.32 mg/dL — ABNORMAL HIGH (ref 0.44–1.00)
GFR calc Af Amer: 43 mL/min — ABNORMAL LOW (ref >60)
GFR calc non Af Amer: 37 mL/min — ABNORMAL LOW (ref >60)
Glucose, Bld: 121 mg/dL — ABNORMAL HIGH (ref 65–99)
Potassium: 3.6 mmol/L (ref 3.5–5.1)
Sodium: 137 mmol/L (ref 135–145)

## 2015-03-05 LAB — TROPONIN I: Troponin I: 0.04 ng/mL — ABNORMAL HIGH (ref <0.031)

## 2015-03-05 MED ORDER — SPIRONOLACTONE 25 MG PO TABS
25.0000 mg | ORAL_TABLET | Freq: Every day | ORAL | Status: DC
  Administered 2015-03-05: 25 mg via ORAL
  Filled 2015-03-05: qty 1

## 2015-03-05 MED ORDER — SODIUM CHLORIDE 0.9% FLUSH: 3.0000 mL | INTRAVENOUS | Status: DC | PRN

## 2015-03-05 MED ORDER — AMLODIPINE BESYLATE 10 MG PO TABS
10.0000 mg | ORAL_TABLET | Freq: Every day | ORAL | Status: DC
  Filled 2015-03-05: qty 1

## 2015-03-05 MED ORDER — SODIUM CHLORIDE 0.9% FLUSH
3.0000 mL | Freq: Two times a day (BID) | INTRAVENOUS | Status: DC
  Administered 2015-03-05: 3 mL via INTRAVENOUS

## 2015-03-05 MED ORDER — ROSUVASTATIN CALCIUM 20 MG PO TABS
20.0000 mg | ORAL_TABLET | Freq: Every day | ORAL | Status: DC
Start: 2015-03-05 — End: 2015-03-05
  Administered 2015-03-05: 20 mg via ORAL
  Filled 2015-03-05: qty 1

## 2015-03-05 MED ORDER — SODIUM CHLORIDE 0.9 % IV SOLN: 250.0000 mL | INTRAVENOUS | Status: DC | PRN

## 2015-03-05 MED ORDER — MENTHOL 3 MG MT LOZG
1.0000 | LOZENGE | OROMUCOSAL | Status: DC | PRN
  Filled 2015-03-05 (×2): qty 9

## 2015-03-05 MED ORDER — FUROSEMIDE 20 MG PO TABS
20.0000 mg | ORAL_TABLET | Freq: Every day | ORAL | Status: DC
  Administered 2015-03-05: 20 mg via ORAL
  Filled 2015-03-05: qty 1

## 2015-03-05 MED ORDER — ZOLPIDEM TARTRATE 5 MG PO TABS
5.0000 mg | ORAL_TABLET | Freq: Every evening | ORAL | Status: DC | PRN
  Administered 2015-03-05: 5 mg via ORAL
  Filled 2015-03-05: qty 1

## 2015-03-05 MED ORDER — FUROSEMIDE 20 MG PO TABS: 20.0000 mg | ORAL_TABLET | Freq: Every day | ORAL | Status: DC

## 2015-03-05 MED ORDER — DOFETILIDE 125 MCG PO CAPS
125.0000 ug | ORAL_CAPSULE | Freq: Two times a day (BID) | ORAL | Status: DC
  Administered 2015-03-05: 125 ug via ORAL
  Filled 2015-03-05 (×2): qty 1

## 2015-03-05 MED ORDER — APIXABAN 5 MG PO TABS
5.0000 mg | ORAL_TABLET | Freq: Two times a day (BID) | ORAL | Status: DC
  Administered 2015-03-05 (×2): 5 mg via ORAL
  Filled 2015-03-05 (×2): qty 1

## 2015-03-05 MED ORDER — SODIUM CHLORIDE 0.9% FLUSH
3.0000 mL | Freq: Two times a day (BID) | INTRAVENOUS | Status: DC
  Administered 2015-03-05 (×2): 3 mL via INTRAVENOUS

## 2015-03-05 NOTE — Discharge Instructions (Signed)

## 2015-03-05 NOTE — Progress Notes (Signed)
MD notified of HR  43 ,107/50 no orders given at this time ,will continue to monitor patient.

## 2015-03-05 NOTE — Progress Notes (Signed)
Family Medicine Teaching Service Daily Progress Note Intern Pager: [phone]  Patient name: Jodi [name] Medical record number: [id number] Date of birth: 1932/03/17 Age: 82 y.o. Gender: female  Primary Care Provider: [name], MD Consultants: none Code Status: Full   Pt Overview and Major Events to Date:  1/27: admitted for cough/SOB  Assessment and Plan: Jodi [name] is a 82 y.o. female presenting with cough 2/2 to CHF exacerbation. PMH is significant for HTN, Renal cell carcinoma, HLD, HFrEF, CAD s/p CABG, paroxysmal afib on chronic anticoagulation and bradycardia.   Cough: had good response with the IV lasix.  - restart lasix 20 mg PO  - strict in/outs   Paroxysmal Atrial Fibrillation: Unable to tolerate amiodarone in the past secondary to bradycardia - continue Tikosyn   Severe aortic stenosis/Moderate PAH: Per chart review she is not a candidate for mitral valve repair along with aortic valve replacement. Observed on Cath 11/2013. Denies any chest pain or loss consciousness - Continue apixaban  - telemetry   CAD s/p CABG/HTN/HLD: Stable - continue amlodipine and spironolactone  - Continue Crestor  Renal cell carcinoma: last imaging 2012 showing solid lesion in right kideny upper pole that was suspicious for renal cell carcinoma. Upon char review(2012), she did not favor surgery   FEN/GI: Saline lock, heart healthy  PPx: apixaban   Disposition: pending improvement.   Subjective:  She reports improvement in her cough but still present. Sleeping when walking into the room.   Objective: Temp:  [98.2 F (36.8 C)-98.7 F (37.1 C)] 98.6 F (37 C) (01/28 0616) Pulse Rate:  [51-83] 52 (01/28 0616) Resp:  [11-19] 16 (01/28 0616) BP: (150-182)/(57-82) 150/58 mmHg (01/28 0616) SpO2:  [92 %-97 %] 97 % (01/28 0616) Weight:  [161 lb 6.4 oz (73.211 kg)-161 lb 12.8 oz (73.392 kg)] 161 lb 12.8 oz (73.392 kg) (01/28 0028) Physical Exam: Gen: NAD,  alert,  CV: bradycardia, regular rhthym, good S1/S2, no murmur,  Resp: CTABL, no wheezes, non-labored Skin: no rashes, normal turgor  Neuro: no gross deficits.    Laboratory:  Recent Labs Lab 03/04/15 2003  WBC 8.9  HGB 13.6  HCT 40.1  PLT 218    Recent Labs Lab 03/04/15 2003 03/05/15 0336  NA 137 137  K 4.0 3.6  CL 100* 99*  CO2 24 27  BUN 17 17  CREATININE 1.37* 1.32*  CALCIUM 10.2 9.6  GLUCOSE 117* 121*    Imaging/Diagnostic Tests: Dg Chest 2 View  03/04/2015  CLINICAL DATA:  Cough, wheezing, and shortness of breath for over a week. Previous history of cardiac bypass and bronchitis. EXAM: CHEST  2 VIEW COMPARISON:  05/19/2014 FINDINGS: Postoperative changes in the mediastinum. Diffuse cardiac enlargement. Calcification in the mitral valve annulus. Calcified and tortuous aorta. Mild pulmonary vascular congestion. Interstitial pattern to the lungs may represent mild edema. Emphysematous changes in the lungs. No blunting of costophrenic angles. No pneumothorax. IMPRESSION: Cardiac enlargement with mild pulmonary vascular congestion and interstitial changes suggesting interstitial edema. Underlying pulmonary emphysematous change. Electronically Signed   By: Lucienne Capers M.D.   On: 03/04/2015 18:27     Rosemarie Ax, MD 03/05/2015, 7:01 AM PGY-3, [place] Intern pager: [phone], text pages welcome

## 2015-03-05 NOTE — Discharge Summary (Signed)
[facility] Discharge Summary  Patient name: Jodi [name] Medical record number: [id number] Date of birth: 05/29/1932 Age: 82 y.o. Gender: female Date of Admission: 03/04/2015  Date of Discharge: 03/05/2015 Admitting Physician: Blane Ohara McDiarmid, MD  Primary Care Provider: [name], MD Consultants: none  Indication for Hospitalization: cough  Discharge Diagnoses/Problem List:  Patient Active Problem List   Diagnosis Date Noted  . SOB (shortness of breath) 03/05/2015  . Chronic combined systolic and diastolic congestive heart failure ([place]) 03/04/2015  . Left renal mass   . Paroxysmal atrial fibrillation ([place]) 08/20/2013  . Renal cell carcinoma ([place]) 09/23/2012  . Chronic kidney disease (CKD), stage III (moderate) 02/20/2012  . Aortic stenosis, severe 12/06/2010  . SEBORRHEA 11/10/2007  . INSOMNIA 06/09/2007  . Otitis media 06/04/2006  . HYPERCHOLESTEROLEMIA 04/04/2006  . HYPERTENSION, BENIGN SYSTEMIC 04/04/2006  . CORONARY, ARTERIOSCLEROSIS 04/04/2006  . Acute combined systolic and diastolic heart failure ([place]) 04/04/2006  . OSTEOARTHRITIS, MULTI SITES 04/04/2006   Disposition: home   Discharge Condition: stable   Discharge Exam:  Gen: NAD, alert,  CV: bradycardia, regular rhthym, good S1/S2, no murmur,  Resp: CTABL, no wheezes, non-labored Skin: no rashes, normal turgor  Neuro: no gross deficits.   Brief Hospital Course:  Jodi [name] is a 82 y.o. female presenting with cough 2/2 to CHF exacerbation. PMH is significant for HTN, Renal cell carcinoma, HLD, HFrEF, CAD s/p CABG, paroxysmal afib on chronic anticoagulation and bradycardia.   Cough: she was having a persistent cough so she increased the amount of water that she was drinking during the day to 4-5 bottles of water. She only takes lasix when her LE becomes edematous. She was seen at the urgent care and her CXR showing interstitial edema suggestive of a CHF  exacerbation. Her BNP at the hospital was 529. She received IV lasix and her crackles resolved with good urine output. She was transitioned to daily po lasix and will need to determine if she needs to continue this or only to take as needed.   Severe aortic stenosis: there is a risk of hypotension in regards to her ongoing diuresis. Due to this her amlodipine was held at discharge but will need to be started at hospital follow up or when she is seen by her Cardiologist Dr. Einar Gip.   Bradycardia: she take tikosyn for her afib and she had a HR in the 40's during admission.   Issues for Follow Up:  1. CHF: started on daily lasix until hospital follow up. Is there need for daily lasix? Restart amlodipine  2. Bradycardia: heart rates ranged in the 40-60's.  Does she need titration of her Tikosyn. Dr. Einar Gip is her cardiologist.   Significant Procedures: none  Significant Labs and Imaging:   Recent Labs Lab 03/04/15 2003  WBC 8.9  HGB 13.6  HCT 40.1  PLT 218    Recent Labs Lab 03/04/15 2003 03/05/15 0336  NA 137 137  K 4.0 3.6  CL 100* 99*  CO2 24 27  GLUCOSE 117* 121*  BUN 17 17  CREATININE 1.37* 1.32*  CALCIUM 10.2 9.6      Results/Tests Pending at Time of Discharge: none  Discharge Medications:    Medication List    STOP taking these medications        amLODipine 10 MG tablet  Commonly known as:  NORVASC     amoxicillin-clavulanate 875-125 MG tablet  Commonly known as:  AUGMENTIN     ciprofloxacin-dexamethasone otic suspension  Commonly known as:  CIPRODEX     naproxen sodium 220 MG tablet  Commonly known as:  ANAPROX      TAKE these medications        apixaban 5 MG Tabs tablet  Commonly known as:  ELIQUIS  Take 1 tablet (5 mg total) by mouth 2 (two) times daily.     beta carotene w/minerals tablet  Take 1 tablet by mouth daily.     CRANBERRY PO  Take 2 capsules by mouth daily with lunch.     CRESTOR 20 MG tablet  Generic drug:  rosuvastatin  TAKE  1 TABLET BY MOUTH DAILY     dofetilide 125 MCG capsule  Commonly known as:  TIKOSYN  Take 1 capsule (125 mcg total) by mouth 2 (two) times daily.     furosemide 20 MG tablet  Commonly known as:  LASIX  Take 1 tablet (20 mg total) by mouth daily. Daily until your hospital follow up.     multivitamin with minerals Tabs tablet  Take 1 tablet by mouth daily.     spironolactone 25 MG tablet  Commonly known as:  ALDACTONE  TAKE 1 TABLET BY MOUTH EVERY DAY.     SYSTANE OP  Place 1 drop into both eyes daily as needed (itching/ soreness in corner of eyes).     Vitamin D3 2000 units Tabs  Take 2,000 Units by mouth daily.     zolpidem 5 MG tablet  Commonly known as:  AMBIEN  Take 1 tablet (5 mg total) by mouth at bedtime as needed for sleep (Do not take more than 2-3 times per week).        Discharge Instructions: Please refer to Patient Instructions section of EMR for full details.  Patient was counseled important signs and symptoms that should prompt return to medical care, changes in medications, dietary instructions, activity restrictions, and follow up appointments.   Follow-Up Appointments: Follow-up Information    Follow up with Tawanna Sat, MD On 03/08/2015.   Specialty:  Family Medicine   Why:  hospital follow up at 11:30 am    Contact information:   [place] [id number] [phone]       Rosemarie Ax, MD 03/07/2015, 7:09 PM PGY-3, [place]

## 2015-03-08 ENCOUNTER — Encounter: Payer: Self-pay | Admitting: Family Medicine

## 2015-03-08 ENCOUNTER — Ambulatory Visit (INDEPENDENT_AMBULATORY_CARE_PROVIDER_SITE_OTHER): Payer: Medicare Other | Admitting: Family Medicine

## 2015-03-08 VITALS — BP 156/88 | HR 82 | Temp 97.5°F | Ht 64.0 in | Wt 157.0 lb

## 2015-03-08 DIAGNOSIS — I5042 Chronic combined systolic (congestive) and diastolic (congestive) heart failure: Secondary | ICD-10-CM

## 2015-03-08 MED ORDER — HYDROCODONE-HOMATROPINE 5-1.5 MG/5ML PO SYRP: 5.0000 mL | ORAL_SOLUTION | Freq: Four times a day (QID) | ORAL | Status: DC | PRN

## 2015-03-08 NOTE — Patient Instructions (Signed)
Come back in about 1 month.  Measure your weight every day in the morning, write this down on a sheet of paper/diary. If you are up more than 5 lbs you need to make sure you take the lasix and then call the clinic to be seen by a doctor.   Take the cough syrup only at night. You need to be VERY CAREFUL WITH WALKING BECAUSE IT CAN PUT YOU AT HIGHER RISKS FOR A FALL

## 2015-03-08 NOTE — Progress Notes (Signed)
   Subjective:    Patient ID: Jodi [name], female    DOB: 1932-04-30, 82 y.o.   MRN: [id number]  HPI  CC: hospital follow up for cough  # Cough:  Admitted Friday to Saturday at the hospital for fluid on lungs and suspected CHF exacerbation  Given lasix but didn't really improve her symptoms. She was discharged because her breathing status was okay  Cough has been about the same since discharge. Total duration so far is about 11 days  Has tried dayquil  No major weight gains, no lower leg swelling ROS: no fevers or chills, nausea or vomiting  Social Hx: "never" smoker - had a few packs when she was younger but never regularly  Review of Systems   See HPI for ROS.   Past medical history, surgical, family, and social history reviewed and updated in the EMR as appropriate. Objective:  BP 156/88 mmHg  Pulse 82  Temp(Src) 97.5 F (36.4 C) (Oral)  Ht 5\' 4"  (1.626 m)  Wt 157 lb (71.215 kg)  BMI 26.94 kg/m2 Vitals and nursing note reviewed  General: no apparent distress, pleasant CV: normal rate, regular rhythm, systolic murmur appreciated, 2+ radial pulses bilaterally  Resp: bibasilar crackles present, no wheezes or rhonchi, normal effort Extremities: no LE edema (actually remarkably thin LE).   Assessment & Plan:  Chronic combined systolic and diastolic congestive heart failure (HCC) Reports no significant change from leaving the hospital while taking daily lasix. At this time I recommended she stop this daily and measure daily weights (keeping a diary) and take her lasix/contact doctors if she is up about 5 lbs or up any weight and symptomatic. She still has fairly significant crackles at her lung bases so likely still has fluid there; I went back and reviewed her previous CXR and it seems she has an ongoing issue with pleural effusions with a history of renal cell carcinoma. If respiratory status does not improve/worsens I wonder if a thoracentesis would be prudent to  evaluate cytology. Given that the pleural effusion had resolved between 2014 and 2015 it is more likely to be related to her CHF. Asked daughter at bedside to call Dr. Irven Shelling office for earlier appointment for hospital follow up as well. For her ongoing cough I did do a short prescription of hycodan syrup to try and give her some relief/sleep at night, discussed increased risks of falls with this type of medicine. Follow up next month with PCP.

## 2015-03-09 NOTE — Assessment & Plan Note (Addendum)
Reports no significant change from leaving the hospital while taking daily lasix. At this time I recommended she stop this daily and measure daily weights (keeping a diary) and take her lasix/contact doctors if she is up about 5 lbs or up any weight and symptomatic. She still has fairly significant crackles at her lung bases so likely still has fluid there; I went back and reviewed her previous CXR and it seems she has an ongoing issue with pleural effusions with a history of renal cell carcinoma. If respiratory status does not improve/worsens I wonder if a thoracentesis would be prudent to evaluate cytology. Given that the pleural effusion had resolved between 2014 and 2015 it is more likely to be related to her CHF. Asked daughter at bedside to call Dr. Irven Shelling office for earlier appointment for hospital follow up as well. For her ongoing cough I did do a short prescription of hycodan syrup to try and give her some relief/sleep at night, discussed increased risks of falls with this type of medicine. Follow up next month with PCP.

## 2015-03-10 DIAGNOSIS — I480 Paroxysmal atrial fibrillation: Secondary | ICD-10-CM | POA: Diagnosis not present

## 2015-03-14 ENCOUNTER — Other Ambulatory Visit: Payer: Self-pay | Admitting: Family Medicine

## 2015-03-16 ENCOUNTER — Ambulatory Visit (INDEPENDENT_AMBULATORY_CARE_PROVIDER_SITE_OTHER): Payer: Medicare Other | Admitting: Family Medicine

## 2015-03-16 ENCOUNTER — Other Ambulatory Visit: Payer: Self-pay | Admitting: *Deleted

## 2015-03-16 ENCOUNTER — Encounter: Payer: Self-pay | Admitting: Family Medicine

## 2015-03-16 VITALS — BP 153/62 | HR 66 | Temp 98.3°F | Wt 157.9 lb

## 2015-03-16 DIAGNOSIS — R0602 Shortness of breath: Secondary | ICD-10-CM

## 2015-03-16 DIAGNOSIS — I10 Essential (primary) hypertension: Secondary | ICD-10-CM | POA: Diagnosis not present

## 2015-03-16 MED ORDER — HYDROCODONE-HOMATROPINE 5-1.5 MG/5ML PO SYRP: 5.0000 mL | ORAL_SOLUTION | Freq: Four times a day (QID) | ORAL | Status: DC | PRN

## 2015-03-16 MED ORDER — GUAIFENESIN ER 600 MG PO TB12: 600.0000 mg | ORAL_TABLET | Freq: Two times a day (BID) | ORAL | Status: DC

## 2015-03-16 NOTE — Assessment & Plan Note (Signed)
Unchanged - most consistent with viral bronchitis with prolonged cough.  Continue cough suppressant - cautioned on increased risk of falls.  Will add mucolytic

## 2015-03-16 NOTE — Assessment & Plan Note (Signed)
Not adequate control - At goal blood pressure but low pulse is concerning.  Have her monitor.  Does not seem to be on any medications that could cause except possibly amlodipine although she did not bring in her medications.

## 2015-03-16 NOTE — Patient Instructions (Signed)
Good to see you today!  Thanks for coming in.  I think you have bronchitis.   I do not see any pneumonia or fluid in your lungs.   Your cough will slowly get better over the next 4 weeks  If you have fever or thick sputum or leg swelling call us immediately  Bring all your medication bottles next time  Take the hycodan as you need it for the cough  Take the guafenicsin twice daily every day for the cough  Come back in 2 months

## 2015-03-16 NOTE — Progress Notes (Signed)
   Subjective:    Patient ID: Jodi [name], female    DOB: Jun 28, 1932, 82 y.o.   MRN: [id number]  HPI  Shortness of breath and cough Feels slightly better than last visit here.  Was seen by Dr Nadyne Coombes and started on course of antibiotics for bronchits which she just finished.  Continues to  Have dry cough and mild shortness of breath with exertion The cough suppresant helps a little.  No fever or chills or leg swelling or sputum  HTN Her home bps running around 130-120/60s with a low pulse yesterday of 42.  She thinks she is taking amlodipine 10 mg daily but did not bring in her medications.  Is taking lasix daily now.  No chest pain or leg swelling.  Occsl mild lightheadness but no falls or near syncope  Chief Complaint noted Review of Symptoms - see HPI PMH - Smoking status noted.   Vital Signs reviewed   Review of Systems     Objective:   Physical Exam Alert able to get up and down from exam table without assistance Heart - regular with loud systolic murmur Lungs - no rales with scant bilateral wheeze no dullness  Extremities - no edema        Assessment & Plan:

## 2015-03-18 MED ORDER — ZOLPIDEM TARTRATE 5 MG PO TABS: 5.0000 mg | ORAL_TABLET | Freq: Every evening | ORAL | Status: DC | PRN

## 2015-03-18 NOTE — Telephone Encounter (Signed)
Pls call in Rx Thanks! 

## 2015-03-21 DIAGNOSIS — H353221 Exudative age-related macular degeneration, left eye, with active choroidal neovascularization: Secondary | ICD-10-CM | POA: Diagnosis not present

## 2015-03-21 DIAGNOSIS — H353134 Nonexudative age-related macular degeneration, bilateral, advanced atrophic with subfoveal involvement: Secondary | ICD-10-CM | POA: Diagnosis not present

## 2015-03-21 NOTE — Telephone Encounter (Signed)
Rx called into [facility].

## 2015-03-21 NOTE — Telephone Encounter (Signed)
Needs refill on [facility]

## 2015-04-07 DIAGNOSIS — Z5181 Encounter for therapeutic drug level monitoring: Secondary | ICD-10-CM | POA: Diagnosis not present

## 2015-04-07 DIAGNOSIS — I480 Paroxysmal atrial fibrillation: Secondary | ICD-10-CM | POA: Diagnosis not present

## 2015-04-10 ENCOUNTER — Emergency Department (HOSPITAL_COMMUNITY): Payer: Medicare Other

## 2015-04-10 ENCOUNTER — Emergency Department (HOSPITAL_COMMUNITY)
Admission: EM | Admit: 2015-04-10 | Discharge: 2015-04-10 | Disposition: A | Discharge status: Home / Self Care | Payer: Medicare Other | Attending: Emergency Medicine | Admitting: Emergency Medicine

## 2015-04-10 ENCOUNTER — Encounter (HOSPITAL_COMMUNITY): Payer: Self-pay | Admitting: Emergency Medicine

## 2015-04-10 DIAGNOSIS — M1990 Unspecified osteoarthritis, unspecified site: Secondary | ICD-10-CM | POA: Diagnosis not present

## 2015-04-10 DIAGNOSIS — Z8673 Personal history of transient ischemic attack (TIA), and cerebral infarction without residual deficits: Secondary | ICD-10-CM | POA: Diagnosis not present

## 2015-04-10 DIAGNOSIS — R0602 Shortness of breath: Secondary | ICD-10-CM | POA: Diagnosis present

## 2015-04-10 DIAGNOSIS — R069 Unspecified abnormalities of breathing: Secondary | ICD-10-CM | POA: Diagnosis not present

## 2015-04-10 DIAGNOSIS — Z9861 Coronary angioplasty status: Secondary | ICD-10-CM | POA: Insufficient documentation

## 2015-04-10 DIAGNOSIS — Z862 Personal history of diseases of the blood and blood-forming organs and certain disorders involving the immune mechanism: Secondary | ICD-10-CM | POA: Diagnosis not present

## 2015-04-10 DIAGNOSIS — I509 Heart failure, unspecified: Secondary | ICD-10-CM | POA: Insufficient documentation

## 2015-04-10 DIAGNOSIS — J209 Acute bronchitis, unspecified: Secondary | ICD-10-CM | POA: Diagnosis not present

## 2015-04-10 DIAGNOSIS — R011 Cardiac murmur, unspecified: Secondary | ICD-10-CM | POA: Diagnosis not present

## 2015-04-10 DIAGNOSIS — N183 Chronic kidney disease, stage 3 (moderate): Secondary | ICD-10-CM | POA: Diagnosis not present

## 2015-04-10 DIAGNOSIS — I252 Old myocardial infarction: Secondary | ICD-10-CM | POA: Insufficient documentation

## 2015-04-10 DIAGNOSIS — Z85528 Personal history of other malignant neoplasm of kidney: Secondary | ICD-10-CM | POA: Insufficient documentation

## 2015-04-10 DIAGNOSIS — I129 Hypertensive chronic kidney disease with stage 1 through stage 4 chronic kidney disease, or unspecified chronic kidney disease: Secondary | ICD-10-CM | POA: Insufficient documentation

## 2015-04-10 DIAGNOSIS — Z951 Presence of aortocoronary bypass graft: Secondary | ICD-10-CM | POA: Diagnosis not present

## 2015-04-10 DIAGNOSIS — I2510 Atherosclerotic heart disease of native coronary artery without angina pectoris: Secondary | ICD-10-CM | POA: Insufficient documentation

## 2015-04-10 DIAGNOSIS — E785 Hyperlipidemia, unspecified: Secondary | ICD-10-CM | POA: Insufficient documentation

## 2015-04-10 DIAGNOSIS — Z79899 Other long term (current) drug therapy: Secondary | ICD-10-CM | POA: Diagnosis not present

## 2015-04-10 DIAGNOSIS — Z9889 Other specified postprocedural states: Secondary | ICD-10-CM | POA: Insufficient documentation

## 2015-04-10 DIAGNOSIS — I480 Paroxysmal atrial fibrillation: Secondary | ICD-10-CM | POA: Diagnosis not present

## 2015-04-10 LAB — CBC WITH DIFFERENTIAL/PLATELET
Basophils Absolute: 0.1 10*3/uL (ref 0.0–0.1)
Basophils Relative: 1 %
Eosinophils Absolute: 0.1 10*3/uL (ref 0.0–0.7)
Eosinophils Relative: 1 %
HCT: 39.5 % (ref 36.0–46.0)
Hemoglobin: 13.5 g/dL (ref 12.0–15.0)
Lymphocytes Relative: 43 %
Lymphs Abs: 3.5 10*3/uL (ref 0.7–4.0)
MCH: 30.8 pg (ref 26.0–34.0)
MCHC: 34.2 g/dL (ref 30.0–36.0)
MCV: 90.2 fL (ref 78.0–100.0)
Monocytes Absolute: 1.4 10*3/uL — ABNORMAL HIGH (ref 0.1–1.0)
Monocytes Relative: 17 %
Neutro Abs: 3 10*3/uL (ref 1.7–7.7)
Neutrophils Relative %: 38 %
Platelets: 156 10*3/uL (ref 150–400)
RBC: 4.38 MIL/uL (ref 3.87–5.11)
RDW: 13.7 % (ref 11.5–15.5)
WBC: 8 10*3/uL (ref 4.0–10.5)

## 2015-04-10 LAB — BASIC METABOLIC PANEL
Anion gap: 16 — ABNORMAL HIGH (ref 5–15)
BUN: 27 mg/dL — ABNORMAL HIGH (ref 6–20)
CO2: 20 mmol/L — ABNORMAL LOW (ref 22–32)
Calcium: 10 mg/dL (ref 8.9–10.3)
Chloride: 99 mmol/L — ABNORMAL LOW (ref 101–111)
Creatinine, Ser: 1.49 mg/dL — ABNORMAL HIGH (ref 0.44–1.00)
GFR calc Af Amer: 37 mL/min — ABNORMAL LOW (ref >60)
GFR calc non Af Amer: 32 mL/min — ABNORMAL LOW (ref >60)
Glucose, Bld: 131 mg/dL — ABNORMAL HIGH (ref 65–99)
Potassium: 4.3 mmol/L (ref 3.5–5.1)
Sodium: 135 mmol/L (ref 135–145)

## 2015-04-10 LAB — BRAIN NATRIURETIC PEPTIDE: B Natriuretic Peptide: 276 pg/mL — ABNORMAL HIGH (ref 0.0–100.0)

## 2015-04-10 LAB — I-STAT TROPONIN, ED: Troponin i, poc: 0.03 ng/mL (ref 0.00–0.08)

## 2015-04-10 MED ORDER — NITROGLYCERIN 2 % TD OINT: 1.0000 [in_us] | TOPICAL_OINTMENT | Freq: Once | TRANSDERMAL | Status: DC

## 2015-04-10 MED ORDER — ALBUTEROL SULFATE HFA 108 (90 BASE) MCG/ACT IN AERS
1.0000 | INHALATION_SPRAY | Freq: Four times a day (QID) | RESPIRATORY_TRACT | Status: DC | PRN
Start: 2015-04-10 — End: 2015-04-10

## 2015-04-10 MED ORDER — BENZONATATE 100 MG PO CAPS
100.0000 mg | ORAL_CAPSULE | Freq: Once | ORAL | Status: AC
  Administered 2015-04-10: 100 mg via ORAL
  Filled 2015-04-10: qty 1

## 2015-04-10 MED ORDER — BENZONATATE 100 MG PO CAPS: 100.0000 mg | ORAL_CAPSULE | Freq: Three times a day (TID) | ORAL | Status: DC

## 2015-04-10 MED ORDER — ALBUTEROL SULFATE (2.5 MG/3ML) 0.083% IN NEBU
5.0000 mg | INHALATION_SOLUTION | Freq: Once | RESPIRATORY_TRACT | Status: AC
  Administered 2015-04-10: 5 mg via RESPIRATORY_TRACT
  Filled 2015-04-10: qty 6

## 2015-04-10 NOTE — Discharge Instructions (Signed)

## 2015-04-10 NOTE — ED Notes (Signed)
Pt walked in hallway, 96% on room air.

## 2015-04-10 NOTE — ED Provider Notes (Signed)
CSN: [id number]     Arrival date & time 04/10/15  0827 History   First MD Initiated Contact with Patient 04/10/15 [phone]     Chief Complaint  Patient presents with  . Shortness of Breath     Patient is a 82 y.o. female presenting with shortness of breath. The history is provided by the patient and the EMS personnel. No language interpreter was used.  Shortness of Breath  Jodi [name] is a 82 y.o. female who presents to the Emergency Department complaining of SOB.  History is provided by the patient and EMS. She reports several days of cough and increased chest congestion. She is having productive cough with yellow sputum.  Temperature to 99 at home yesterday.  She awoke at 3:30 this morning with sudden onset shortness of breath. EMS was called and on their arrival they report she had significant increased work of breathing  in severe distress with accessory muscle use and diffuse rhonchi. They started her on CPAP and provided Solu-Medrol and albuterol. They state that since she was started on the CPAP her work of breathing has significantly improved and her lung exam completely cleared in the left lung fields. Patient reports feeling significantly improved compared to earlier.   Past Medical History  Diagnosis Date  . Coronary artery disease   . Hypertension   . Heart murmur   . Aortic stenosis   . CHF (congestive heart failure) ([place])   . Shortness of breath   . Renal mass 12/06/2010    CT Abdomen 11-27-10 upper pole region of the right kidney which is suspicious for solid lesion, measuring 1.9 x 1.3 cm. This lesion is concerning for renal cell carcinoma given the solid appearance.  Following with Dr [name].  Renal bx was benign    . Splenic infarct 12/06/2010  . Hyperlipidemia   . Myocardial infarction [facility]) 1995; 2003  . Stroke [facility]) 2003    "when I had heart surgery"; denies residual on 09/28/2014  . TIA (transient ischemic attack) "several"  . Arthritis     "maybe in my fingers  and toes" (09/28/2014)  . Chronic renal insufficiency, stage III (moderate)     [name] 09/27/2014  . Renal cell carcinoma ([place]) 09/23/2012  . Macular degeneration, left eye   . Paroxysmal atrial fibrillation (HCC)   . Bradycardia    Past Surgical History  Procedure Laterality Date  . Cataract extraction w/ intraocular lens  implant, bilateral Bilateral ~ 2013  . Cardiac catheterization    . Cardioversion N/A 10/20/2013    Procedure: CARDIOVERSION;  Surgeon: Laverda Page, MD;  Location: [facility] ENDOSCOPY;  Service: Cardiovascular;  Laterality: N/A;  H&P in file  . Percutaneous needle biopsy of renal lesion  ?2014  . Left and right heart catheterization with coronary angiogram N/A 11/24/2013    Procedure: LEFT AND RIGHT HEART CATHETERIZATION WITH CORONARY ANGIOGRAM;  Surgeon: Laverda Page, MD;  Location: [facility] CATH LAB;  Service: Cardiovascular;  Laterality: N/A;  . Cardioversion N/A 05/18/2014    Procedure: CARDIOVERSION;  Surgeon: [name], MD;  Location: [facility];  Service: Cardiovascular;  Laterality: N/A;  . Total abdominal hysterectomy  1990's    "both ovaries were full of little tiny sores"  . Tympanoplasty Bilateral     "had holes in them; still have holes in them"  . Coronary artery bypass graft  1995 and 2003  . Coronary angioplasty     Family History  Problem Relation Age of Onset  . Coronary artery  disease Mother   . Coronary artery disease Brother   . Stroke Mother   . Heart attack Brother   . Heart attack Son   . Heart attack Daughter   . Heart attack Sister    Social History  Substance Use Topics  . Smoking status: Never Smoker   . Smokeless tobacco: Never Used  . Alcohol Use: No   OB History    No data available     Review of Systems  Respiratory: Positive for shortness of breath.   All other systems reviewed and are negative.     Allergies  Sulfamethoxazole-trimethoprim and Tape  Home Medications   Prior to Admission medications   Medication  Sig Start Date End Date Taking? Authorizing Provider  amLODipine (NORVASC) 10 MG tablet Take 10 mg by mouth daily.  03/14/15  Yes Historical Provider, MD  apixaban (ELIQUIS) 5 MG TABS tablet Take 1 tablet (5 mg total) by mouth 2 (two) times daily. 08/21/13  Yes [name], MD  beta carotene w/minerals (OCUVITE) tablet Take 1 tablet by mouth daily.   Yes Historical Provider, MD  Cholecalciferol (VITAMIN D3) 2000 UNITS TABS Take 2,000 Units by mouth daily at 12 noon.    Yes Historical Provider, MD  CRANBERRY PO Take 2 capsules by mouth daily with lunch.   Yes Historical Provider, MD  dofetilide (TIKOSYN) 125 MCG capsule Take 1 capsule (125 mcg total) by mouth 2 (two) times daily. 09/30/14  Yes [name], MD  furosemide (LASIX) 20 MG tablet Take 1 tablet (20 mg total) by mouth daily. Daily until your hospital follow up. 03/05/15  Yes Rosemarie Ax, MD  guaiFENesin (MUCINEX) 600 MG 12 hr tablet Take 1 tablet (600 mg total) by mouth 2 (two) times daily. Patient taking differently: Take 600 mg by mouth 2 (two) times daily as needed for cough or to loosen phlegm.  03/16/15  Yes [name], MD  Multiple Vitamin (MULTIVITAMIN WITH MINERALS) TABS tablet Take 1 tablet by mouth daily.   Yes Historical Provider, MD  naproxen sodium (ANAPROX) 220 MG tablet Take 440 mg by mouth daily as needed (for headache).   Yes Historical Provider, MD  Polyethyl Glycol-Propyl Glycol (SYSTANE OP) Place 1 drop into both eyes daily as needed (itching/ soreness in corner of eyes).   Yes Historical Provider, MD  rosuvastatin (CRESTOR) 20 MG tablet TAKE 1 TABLET BY MOUTH DAILY Patient taking differently: TAKE 1 TABLET BY MOUTH every evening 03/14/15  Yes [name], MD  spironolactone (ALDACTONE) 25 MG tablet TAKE 1 TABLET BY MOUTH EVERY DAY. 03/14/15  Yes [name], MD  zolpidem (AMBIEN) 5 MG tablet Take 1 tablet (5 mg total) by mouth at bedtime as needed for sleep (Do not take more than 2-3 times per week).  03/18/15  Yes [name], MD  albuterol (PROVENTIL HFA;VENTOLIN HFA) 108 (90 Base) MCG/ACT inhaler Inhale 2 puffs into the lungs every 6 (six) hours as needed for wheezing or shortness of breath. 04/11/15   Patrecia Pour, MD  benzonatate (TESSALON) 100 MG capsule Take 1 capsule (100 mg total) by mouth every 8 (eight) hours. 04/10/15   Quintella Reichert, MD  HYDROcodone-homatropine [facility]) 5-1.5 MG/5ML syrup Take 5 mLs by mouth every 6 (six) hours as needed for cough. 04/11/15   Patrecia Pour, MD   BP 148/60 mmHg  Pulse 55  Temp(Src) 98.2 F (36.8 C) (Oral)  Resp 18  SpO2 97% Physical Exam  Constitutional: She is oriented to  person, place, and time. She appears well-developed and well-nourished.  HENT:  Head: Normocephalic and atraumatic.  Cardiovascular: Normal rate and regular rhythm.   No murmur heard. Pulmonary/Chest: No respiratory distress.  Tachypnea with rhonchi bilaterally.  Abdominal: Soft. There is no tenderness. There is no rebound and no guarding.  Musculoskeletal: She exhibits no edema or tenderness.  Neurological: She is alert and oriented to person, place, and time.  Skin: Skin is warm and dry.  Psychiatric: She has a normal mood and affect. Her behavior is normal.  Nursing note and vitals reviewed.   ED Course  Procedures (including critical care time) Labs Review Labs Reviewed  BASIC METABOLIC PANEL - Abnormal; Notable for the following:    Chloride 99 (*)    CO2 20 (*)    Glucose, Bld 131 (*)    BUN 27 (*)    Creatinine, Ser 1.49 (*)    GFR calc non Af Amer 32 (*)    GFR calc Af Amer 37 (*)    Anion gap 16 (*)    All other components within normal limits  CBC WITH DIFFERENTIAL/PLATELET - Abnormal; Notable for the following:    Monocytes Absolute 1.4 (*)    All other components within normal limits  BRAIN NATRIURETIC PEPTIDE - Abnormal; Notable for the following:    B Natriuretic Peptide 276.0 (*)    All other components within normal limits  I-STAT  TROPOININ, ED    Imaging Review Dg Chest Port 1 View  04/10/2015  CLINICAL DATA:  Severe shortness of breath, history coronary artery disease post MI, coronary PTCA, and CABG, stroke, hypertension, TIA, CHF, personal history of renal cell carcinoma EXAM: PORTABLE CHEST 1 VIEW COMPARISON:  Portable exam 0913 hours compared to 03/04/2015 FINDINGS: Enlargement of cardiac silhouette post CABG. Elongation of thoracic aorta. Mediastinal contours and pulmonary vascularity normal. Coronary arterial stent noted. Rotation to the RIGHT. No gross infiltrate, pleural effusion or pneumothorax. IMPRESSION: Enlargement of cardiac silhouette post CABG and coronary stenting. No acute abnormalities. Electronically Signed   By: Lavonia Dana M.D.   On: 04/10/2015 09:40   I have personally reviewed and evaluated these images and lab results as part of my medical decision-making.   EKG Interpretation   Date/Time:  Sunday April 10 2015 08:54:31 EST Ventricular Rate:  67 PR Interval:  138 QRS Duration: 114 QT Interval:  494 QTC Calculation: 522 R Axis:   55 Text Interpretation:  Age not entered, assumed to be  82 years old for  purpose of ECG interpretation Sinus rhythm Probable left ventricular  hypertrophy Probable inferior infarct, old Lateral leads are also involved  Prolonged QT interval Confirmed by Hazle Coca [phone]) on 04/10/2015 9:30:46  AM      MDM   Final diagnoses:  Acute bronchitis, unspecified organism    Pt here with increased work of breathing/SOB.  Pt with wheezing on initial ED arrival.  Pt feels improved on repeat eval after neb but persistently wheezing on recheck.  No evidence of acute pna, CHF.  Concern for viral respiratory infection.  Discussed with family medicine service.  On repeat eval pt feeling improved, able to ambulate department without difficulty.  She does have end expiratory wheezing but feels improved, wishes to go home.  Will treat with bronchitis with prn albuterol,  outpatient follow up.  Return precautions discussed.     Quintella Reichert, MD 04/11/15 1950

## 2015-04-10 NOTE — ED Notes (Signed)
Pt arrived via [facility] on CPAP with c/o sob that began this morning when she woke. Was in hospital 2.5 weeks ago w/fluid overload per pt. Recently had bronchitis, has been coughing up yellow sputum. Denies fever or chills. Pt weaned to room air on arrival to room, sats 99%.

## 2015-04-10 NOTE — Progress Notes (Signed)
Family Medicine Progress Note:   Went to see patient for possible admission. Patient reports of cough for the past 2 months. States that her cardiologist prescribed antibiotics for bronchitis about 3 weeks ago. She continues to have cough with some sputum production. This morning, she says that she "suddenly felt like she was having a hard time breathing, got very nervous / anxious and then began struggling even more". She says she was breathing very rapidly. Her son was very worried as well so they called EMS. She started to have shortness of breath and felt that she couldn't catch her breath. Has been battling a cough for a few weeks, recent admission also for CHF. Her symptoms resolved with albuterol in the ambulance, she did briefly require CPAP in the ambulance. States that she has been taking her Lasix daily per her primary care provider (for the past 3 weeks); otherwise she usually takes it as needed as prescribed by Dr. Nadyne Coombes, her cardiologist. She currently feels fine and denies shortness of breath; states the Albuterol helped with the SOB and cough. She would like to go home. Says this does not feel like previous Heart Attack.  Denies chest pain/nausea, diaphoresis, no fever, no chills, no pleuritic chest pain. At time of this discussion, was not SOB.   Patient presented via EMS. EMS reported patient had significant increased work of breathing in severe distress with accessory muscle use and diffuse rhonchi; EMS placed patient on CPAP, gave Solumedrol and Albuterol. In the ED, patient was initially noted to have tachypnea with rhonchi bilaterally. Patient was transitioned off to room air with good O2 saturations and responded to Albuterol treatment. ED reports patient has increased work of breathing with ambulation. Significant labs include: no Luekocytosis. AG 16 with bicarb 20 (2/2 Tachypnea). BNP 276 (lowest value per chart review). Troponin negative x 1. EKG with QTC 522; otherwise no significant  changes compared to previous. CXR - improved from previous.  No edema.  Vitals otherwise unremarkable.   Exam:  Filed Vitals:   04/10/15 0930 04/10/15 1130  BP: 140/56 157/53  Pulse: 63 75  Temp:    Resp: 18 13   Gen: NAD HEENT: NCAT, PERRLA, EOMI Lungs: normal effort, CTAB bilaterally, on room air with o2 saturations between 96-98%   Cardiac: RRR, systolic murmur 3/6 best heard right upper sternal border Ext: no LE edema   A/P: Patient likely has viral bronchitis with an episode of bronchospasm vs panic attack at home. Given resolution with albuterol favor bronchospasm. Patient's vitals are stable and her respiratory status is back at baseline. Patient is clinically euvolemic and BNP is not as elevated as prior values, CXR without significant edema so unlikely this is CHF exacerbation especially since symptoms resolved with Albuterol tx. Considered ACS, however EKG with NSR was unremarkable other than prolonged QTc with Troponin neg x1. Discussed with Dr. Ralene Bathe in the ED who also favored monitoring for a brief period, Ambulating, and if doing well likely able to go home with close outpatient follow up.  - ambulate patient in ED with pulse ox ensure no desats - if patient able to ambulate without desaturations, will likely discharge from ED with close follow up at [facility] tomorrow - would d/c with Albuterol inhaler.   Dennie Fetters, MD PGY 1  I agree with the above evaluation, assessment, and plan. Any correctional changes can be noted in [facility].   Aquilla Hacker, MD Family Medicine Resident - PGY 2

## 2015-04-11 ENCOUNTER — Encounter: Payer: Self-pay | Admitting: Family Medicine

## 2015-04-11 ENCOUNTER — Ambulatory Visit (INDEPENDENT_AMBULATORY_CARE_PROVIDER_SITE_OTHER): Payer: Medicare Other | Admitting: Family Medicine

## 2015-04-11 VITALS — BP 160/58 | HR 74 | Temp 97.6°F | Ht 64.0 in | Wt 161.5 lb

## 2015-04-11 DIAGNOSIS — J208 Acute bronchitis due to other specified organisms: Secondary | ICD-10-CM | POA: Insufficient documentation

## 2015-04-11 MED ORDER — ALBUTEROL SULFATE HFA 108 (90 BASE) MCG/ACT IN AERS: 2.0000 | INHALATION_SPRAY | Freq: Four times a day (QID) | RESPIRATORY_TRACT | Status: DC | PRN

## 2015-04-11 MED ORDER — HYDROCODONE-HOMATROPINE 5-1.5 MG/5ML PO SYRP: 5.0000 mL | ORAL_SOLUTION | Freq: Four times a day (QID) | ORAL | Status: DC | PRN

## 2015-04-11 NOTE — Progress Notes (Signed)
Subjective: Jodi [name] is a 82 y.o. female with a history of chronic combined HF, severe AS, PAF, CKD III and RCC presenting after ED discharge yesterday for cough.   She reports 2 months of waxing/waning sporadic nonproductive cough which has caused minimal dyspnea at times during coughing spells. Yesterday, however, she became acutely short of breath and worried, so called EMS. Solumedrol and albuterol were given along with brief CPAP with improvement. She relates that she felt significantly better after being in the ED and asked to be discharged. She has continued to have cough without dyspnea overnight. She denies fevers. Cough syrup prescribed by PCP earlier this month helped. Tessalon did not help. She tried albuterol at home (a family member's prescription) with some improvement in wheezing. She has continued to take lasix daily.   - ROS: No chest pain, palpitations, N/V/D, hemoptysis.  - Non-smoker, no Dx asthma or COPD.   Objective: BP 160/58 mmHg  Pulse 74  Temp(Src) 97.6 F (36.4 C) (Oral)  Ht 5\' 4"  (1.626 m)  Wt 161 lb 8 oz (73.256 kg)  BMI 27.71 kg/m2  SpO2 96% Gen: Well-appearing elderly female in no distress HEENT: Normocephalic, sclerae clear, conjunctivae normal, pupils equal and reactive, nares normal, moist mucous membranes, posterior oropharynx clear, fair dentition Pulm: Non-labored breathing ambient air, scattered faint wheezes/rhonchi that clear with cough without focal findings, or crackles. CV: Regular rate, III/VI harsh systolic murmur at RUSB radiating to carotids; radial, DP and PT pulses 2+ bilaterally; no LE edema, no JVD, cap refill < 2 sec.  Weight 161.5lbs << 161.8lbs 03/08/15  Assessment/Plan: Jodi [name] is a 82 y.o. female here for bronchitis.  Acute bronchitis due to other specified organisms With prolonged course and high risk for complication due to cardiac comorbidities. She shows no respiratory distress. Will treat symptomatically  with hycodan (helped previously) with caveat about increased risk of falls. Also Rx albuterol as this helped previously and she has some mild wheezing. Pharmacy will demonstrate proper use.  - Follow up in 2 - 3 weeks if not improving, or call EMS if develops respiratory distress again.

## 2015-04-11 NOTE — Patient Instructions (Signed)
Thank you for coming in today!  - You can take the cough syrup as needed for bad coughing, but please be careful as this increases your risk of falls.  - Start taking albuterol (get the pharmacist to show you how to use this) as needed for cough/wheezing. You should NOT need this for very long as I think you're on the tail end of this sick spell.  - If you're not better in a few weeks, schedule another appointment to get checked out.  - If you have bad shortness of breath again that doesn't go away with albuterol, call 911.   Our clinic's number is [phone]. Feel free to call any time with questions or concerns. We will answer any questions after hours with our 24-hour emergency line at that number as well.   - Dr. Bonner Puna

## 2015-04-11 NOTE — Assessment & Plan Note (Signed)
With prolonged course and high risk for complication due to cardiac comorbidities. She shows no respiratory distress. Will treat symptomatically with hycodan (helped previously) with caveat about increased risk of falls. Also Rx albuterol as this helped previously and she has some mild wheezing. Pharmacy will demonstrate proper use.  - Follow up in 2 - 3 weeks if not improving, or call EMS if develops respiratory distress again.

## 2015-04-15 ENCOUNTER — Encounter (HOSPITAL_COMMUNITY): Payer: Self-pay

## 2015-04-15 ENCOUNTER — Emergency Department (HOSPITAL_COMMUNITY)
Admission: EM | Admit: 2015-04-15 | Discharge: 2015-04-15 | Disposition: A | Discharge status: Home / Self Care | Payer: Medicare Other | Attending: Emergency Medicine | Admitting: Emergency Medicine

## 2015-04-15 ENCOUNTER — Other Ambulatory Visit: Payer: Self-pay | Admitting: *Deleted

## 2015-04-15 ENCOUNTER — Emergency Department (HOSPITAL_COMMUNITY): Payer: Medicare Other

## 2015-04-15 DIAGNOSIS — Z8669 Personal history of other diseases of the nervous system and sense organs: Secondary | ICD-10-CM | POA: Insufficient documentation

## 2015-04-15 DIAGNOSIS — N183 Chronic kidney disease, stage 3 (moderate): Secondary | ICD-10-CM | POA: Diagnosis not present

## 2015-04-15 DIAGNOSIS — E785 Hyperlipidemia, unspecified: Secondary | ICD-10-CM | POA: Insufficient documentation

## 2015-04-15 DIAGNOSIS — Z862 Personal history of diseases of the blood and blood-forming organs and certain disorders involving the immune mechanism: Secondary | ICD-10-CM | POA: Insufficient documentation

## 2015-04-15 DIAGNOSIS — R011 Cardiac murmur, unspecified: Secondary | ICD-10-CM | POA: Insufficient documentation

## 2015-04-15 DIAGNOSIS — I129 Hypertensive chronic kidney disease with stage 1 through stage 4 chronic kidney disease, or unspecified chronic kidney disease: Secondary | ICD-10-CM | POA: Insufficient documentation

## 2015-04-15 DIAGNOSIS — M1990 Unspecified osteoarthritis, unspecified site: Secondary | ICD-10-CM | POA: Insufficient documentation

## 2015-04-15 DIAGNOSIS — R05 Cough: Secondary | ICD-10-CM | POA: Diagnosis not present

## 2015-04-15 DIAGNOSIS — I252 Old myocardial infarction: Secondary | ICD-10-CM | POA: Diagnosis not present

## 2015-04-15 DIAGNOSIS — Z8673 Personal history of transient ischemic attack (TIA), and cerebral infarction without residual deficits: Secondary | ICD-10-CM | POA: Insufficient documentation

## 2015-04-15 DIAGNOSIS — I509 Heart failure, unspecified: Secondary | ICD-10-CM | POA: Insufficient documentation

## 2015-04-15 DIAGNOSIS — I110 Hypertensive heart disease with heart failure: Secondary | ICD-10-CM | POA: Diagnosis not present

## 2015-04-15 DIAGNOSIS — I2510 Atherosclerotic heart disease of native coronary artery without angina pectoris: Secondary | ICD-10-CM | POA: Insufficient documentation

## 2015-04-15 DIAGNOSIS — R069 Unspecified abnormalities of breathing: Secondary | ICD-10-CM | POA: Diagnosis not present

## 2015-04-15 DIAGNOSIS — Z79899 Other long term (current) drug therapy: Secondary | ICD-10-CM | POA: Diagnosis not present

## 2015-04-15 DIAGNOSIS — R0602 Shortness of breath: Secondary | ICD-10-CM | POA: Diagnosis present

## 2015-04-15 LAB — BRAIN NATRIURETIC PEPTIDE: B Natriuretic Peptide: 443.9 pg/mL — ABNORMAL HIGH (ref 0.0–100.0)

## 2015-04-15 LAB — BASIC METABOLIC PANEL
Anion gap: 17 — ABNORMAL HIGH (ref 5–15)
BUN: 24 mg/dL — ABNORMAL HIGH (ref 6–20)
CO2: 21 mmol/L — ABNORMAL LOW (ref 22–32)
Calcium: 9.8 mg/dL (ref 8.9–10.3)
Chloride: 101 mmol/L (ref 101–111)
Creatinine, Ser: 1.32 mg/dL — ABNORMAL HIGH (ref 0.44–1.00)
GFR calc Af Amer: 43 mL/min — ABNORMAL LOW (ref >60)
GFR calc non Af Amer: 37 mL/min — ABNORMAL LOW (ref >60)
Glucose, Bld: 185 mg/dL — ABNORMAL HIGH (ref 65–99)
Potassium: 3.8 mmol/L (ref 3.5–5.1)
Sodium: 139 mmol/L (ref 135–145)

## 2015-04-15 LAB — CBC WITH DIFFERENTIAL/PLATELET
Basophils Absolute: 0 10*3/uL (ref 0.0–0.1)
Basophils Relative: 0 %
Eosinophils Absolute: 0.2 10*3/uL (ref 0.0–0.7)
Eosinophils Relative: 2 %
HCT: 41.6 % (ref 36.0–46.0)
Hemoglobin: 14.3 g/dL (ref 12.0–15.0)
Lymphocytes Relative: 32 %
Lymphs Abs: 3.7 10*3/uL (ref 0.7–4.0)
MCH: 31.6 pg (ref 26.0–34.0)
MCHC: 34.4 g/dL (ref 30.0–36.0)
MCV: 91.8 fL (ref 78.0–100.0)
Monocytes Absolute: 0.7 10*3/uL (ref 0.1–1.0)
Monocytes Relative: 6 %
Neutro Abs: 7 10*3/uL (ref 1.7–7.7)
Neutrophils Relative %: 60 %
Platelets: 229 10*3/uL (ref 150–400)
RBC: 4.53 MIL/uL (ref 3.87–5.11)
RDW: 13.5 % (ref 11.5–15.5)
WBC: 11.6 10*3/uL — ABNORMAL HIGH (ref 4.0–10.5)

## 2015-04-15 LAB — TROPONIN I: Troponin I: 0.03 ng/mL (ref <0.031)

## 2015-04-15 LAB — MAGNESIUM: Magnesium: 2 mg/dL (ref 1.7–2.4)

## 2015-04-15 LAB — INFLUENZA PANEL BY PCR (TYPE A & B)
H1N1 flu by pcr: NOT DETECTED
Influenza A By PCR: NEGATIVE
Influenza B By PCR: POSITIVE — AB

## 2015-04-15 MED ORDER — FUROSEMIDE 10 MG/ML IJ SOLN
40.0000 mg | Freq: Once | INTRAMUSCULAR | Status: AC
  Administered 2015-04-15: 40 mg via INTRAVENOUS
  Filled 2015-04-15: qty 4

## 2015-04-15 MED ORDER — BENZONATATE 100 MG PO CAPS: 100.0000 mg | ORAL_CAPSULE | Freq: Three times a day (TID) | ORAL | Status: DC

## 2015-04-15 MED ORDER — ALBUTEROL SULFATE (2.5 MG/3ML) 0.083% IN NEBU
5.0000 mg | INHALATION_SOLUTION | Freq: Once | RESPIRATORY_TRACT | Status: AC
  Administered 2015-04-15: 5 mg via RESPIRATORY_TRACT
  Filled 2015-04-15: qty 6

## 2015-04-15 MED ORDER — IPRATROPIUM BROMIDE 0.02 % IN SOLN
0.5000 mg | Freq: Once | RESPIRATORY_TRACT | Status: AC
  Administered 2015-04-15: 0.5 mg via RESPIRATORY_TRACT
  Filled 2015-04-15: qty 2.5

## 2015-04-15 NOTE — Discharge Instructions (Signed)
Your shortness of breath is likely due to worsening congestive heart failure.  Please double up your Lasix to 40mg  daily instead of 20mg  for the next 3 days then follow up with your doctor next week for further care.  Take cough medication as needed.  Return to the ER if your condition worsen.   Heart Failure Heart failure is a condition in which the heart has trouble pumping blood. This means your heart does not pump blood efficiently for your body to work well. In some cases of heart failure, fluid may back up into your lungs or you may have swelling (edema) in your lower legs. Heart failure is usually a long-term (chronic) condition. It is important for you to take good care of yourself and follow your health care provider's treatment plan. CAUSES  Some health conditions can cause heart failure. Those health conditions include:  High blood pressure (hypertension). Hypertension causes the heart muscle to work harder than normal. When pressure in the blood vessels is high, the heart needs to pump (contract) with more force in order to circulate blood throughout the body. High blood pressure eventually causes the heart to become stiff and weak.  Coronary artery disease (CAD). CAD is the buildup of cholesterol and fat (plaque) in the arteries of the heart. The blockage in the arteries deprives the heart muscle of oxygen and blood. This can cause chest pain and may lead to a heart attack. High blood pressure can also contribute to CAD.  Heart attack (myocardial infarction). A heart attack occurs when one or more arteries in the heart become blocked. The loss of oxygen damages the muscle tissue of the heart. When this happens, part of the heart muscle dies. The injured tissue does not contract as well and weakens the heart's ability to pump blood.  Abnormal heart valves. When the heart valves do not open and close properly, it can cause heart failure. This makes the heart muscle pump harder to keep the  blood flowing.  Heart muscle disease (cardiomyopathy or myocarditis). Heart muscle disease is damage to the heart muscle from a variety of causes. These can include drug or alcohol abuse, infections, or unknown reasons. These can increase the risk of heart failure.  Lung disease. Lung disease makes the heart work harder because the lungs do not work properly. This can cause a strain on the heart, leading it to fail.  Diabetes. Diabetes increases the risk of heart failure. High blood sugar contributes to high fat (lipid) levels in the blood. Diabetes can also cause slow damage to tiny blood vessels that carry important nutrients to the heart muscle. When the heart does not get enough oxygen and food, it can cause the heart to become weak and stiff. This leads to a heart that does not contract efficiently.  Other conditions can contribute to heart failure. These include abnormal heart rhythms, thyroid problems, and low blood counts (anemia). Certain unhealthy behaviors can increase the risk of heart failure, including:  Being overweight.  Smoking or chewing tobacco.  Eating foods high in fat and cholesterol.  Abusing illicit drugs or alcohol.  Lacking physical activity. SYMPTOMS  Heart failure symptoms may vary and can be hard to detect. Symptoms may include:  Shortness of breath with activity, such as climbing stairs.  Persistent cough.  Swelling of the feet, ankles, legs, or abdomen.  Unexplained weight gain.  Difficulty breathing when lying flat (orthopnea).  Waking from sleep because of the need to sit up and get more  air.  Rapid heartbeat.  Fatigue and loss of energy.  Feeling light-headed, dizzy, or close to fainting.  Loss of appetite.  Nausea.  Increased urination during the night (nocturia). DIAGNOSIS  A diagnosis of heart failure is based on your history, symptoms, physical examination, and diagnostic tests. Diagnostic tests for heart failure may  include:  Echocardiography.  Electrocardiography.  Chest X-ray.  Blood tests.  Exercise stress test.  Cardiac angiography.  Radionuclide scans. TREATMENT  Treatment is aimed at managing the symptoms of heart failure. Medicines, behavioral changes, or surgical intervention may be necessary to treat heart failure.  Medicines to help treat heart failure may include:  Angiotensin-converting enzyme (ACE) inhibitors. This type of medicine blocks the effects of a blood protein called angiotensin-converting enzyme. ACE inhibitors relax (dilate) the blood vessels and help lower blood pressure.  Angiotensin receptor blockers (ARBs). This type of medicine blocks the actions of a blood protein called angiotensin. Angiotensin receptor blockers dilate the blood vessels and help lower blood pressure.  Water pills (diuretics). Diuretics cause the kidneys to remove salt and water from the blood. The extra fluid is removed through urination. This loss of extra fluid lowers the volume of blood the heart pumps.  Beta blockers. These prevent the heart from beating too fast and improve heart muscle strength.  Digitalis. This increases the force of the heartbeat.  Healthy behavior changes include:  Obtaining and maintaining a healthy weight.  Stopping smoking or chewing tobacco.  Eating heart-healthy foods.  Limiting or avoiding alcohol.  Stopping illicit drug use.  Physical activity as directed by your health care provider.  Surgical treatment for heart failure may include:  A procedure to open blocked arteries, repair damaged heart valves, or remove damaged heart muscle tissue.  A pacemaker to improve heart muscle function and control certain abnormal heart rhythms.  An internal cardioverter defibrillator to treat certain serious abnormal heart rhythms.  A left ventricular assist device (LVAD) to assist the pumping ability of the heart. HOME CARE INSTRUCTIONS   Take medicines only  as directed by your health care provider. Medicines are important in reducing the workload of your heart, slowing the progression of heart failure, and improving your symptoms.  Do not stop taking your medicine unless directed by your health care provider.  Do not skip any dose of medicine.  Refill your prescriptions before you run out of medicine. Your medicines are needed every day.  Engage in moderate physical activity if directed by your health care provider. Moderate physical activity can benefit some people. The elderly and people with severe heart failure should consult with a health care provider for physical activity recommendations.  Eat heart-healthy foods. Food choices should be free of trans fat and low in saturated fat, cholesterol, and salt (sodium). Healthy choices include fresh or frozen fruits and vegetables, fish, lean meats, legumes, fat-free or low-fat dairy products, and whole grain or high fiber foods. Talk to a dietitian to learn more about heart-healthy foods.  Limit sodium if directed by your health care provider. Sodium restriction may reduce symptoms of heart failure in some people. Talk to a dietitian to learn more about heart-healthy seasonings.  Use healthy cooking methods. Healthy cooking methods include roasting, grilling, broiling, baking, poaching, steaming, or stir-frying. Talk to a dietitian to learn more about healthy cooking methods.  Limit fluids if directed by your health care provider. Fluid restriction may reduce symptoms of heart failure in some people.  Weigh yourself every day. Daily weights are important in  the early recognition of excess fluid. You should weigh yourself every morning after you urinate and before you eat breakfast. Wear the same amount of clothing each time you weigh yourself. Record your daily weight. Provide your health care provider with your weight record.  Monitor and record your blood pressure if directed by your health care  provider.  Check your pulse if directed by your health care provider.  Lose weight if directed by your health care provider. Weight loss may reduce symptoms of heart failure in some people.  Stop smoking or chewing tobacco. Nicotine makes your heart work harder by causing your blood vessels to constrict. Do not use nicotine gum or patches before talking to your health care provider.  Keep all follow-up visits as directed by your health care provider. This is important.  Limit alcohol intake to no more than 1 drink per day for nonpregnant women and 2 drinks per day for men. One drink equals 12 ounces of beer, 5 ounces of wine, or 1 ounces of hard liquor. Drinking more than that is harmful to your heart. Tell your health care provider if you drink alcohol several times a week. Talk with your health care provider about whether alcohol is safe for you. If your heart has already been damaged by alcohol or you have severe heart failure, drinking alcohol should be stopped completely.  Stop illicit drug use.  Stay up-to-date with immunizations. It is especially important to prevent respiratory infections through current pneumococcal and influenza immunizations.  Manage other health conditions such as hypertension, diabetes, thyroid disease, or abnormal heart rhythms as directed by your health care provider.  Learn to manage stress.  Plan rest periods when fatigued.  Learn strategies to manage high temperatures. If the weather is extremely hot:  Avoid vigorous physical activity.  Use air conditioning or fans or seek a cooler location.  Avoid caffeine and alcohol.  Wear loose-fitting, lightweight, and light-colored clothing.  Learn strategies to manage cold temperatures. If the weather is extremely cold:  Avoid vigorous physical activity.  Layer clothes.  Wear mittens or gloves, a hat, and a scarf when going outside.  Avoid alcohol.  Obtain ongoing education and support as  needed.  Participate in or seek rehabilitation as needed to maintain or improve independence and quality of life. SEEK MEDICAL CARE IF:   You have a rapid weight gain.  You have increasing shortness of breath that is unusual for you.  You are unable to participate in your usual physical activities.  You tire easily.  You cough more than normal, especially with physical activity.  You have any or more swelling in areas such as your hands, feet, ankles, or abdomen.  You are unable to sleep because it is hard to breathe.  You feel like your heart is beating fast (palpitations).  You become dizzy or light-headed upon standing up. SEEK IMMEDIATE MEDICAL CARE IF:   You have difficulty breathing.  There is a change in mental status such as decreased alertness or difficulty with concentration.  You have a pain or discomfort in your chest.  You have an episode of fainting (syncope). MAKE SURE YOU:   Understand these instructions.  Will watch your condition.  Will get help right away if you are not doing well or get worse.   This information is not intended to replace advice given to you by your health care provider. Make sure you discuss any questions you have with your health care provider.   Document Released:  01/22/2005 Document Revised: 06/08/2014 Document Reviewed: 02/22/2012 Elsevier Interactive Patient Education [employer].

## 2015-04-15 NOTE — ED Notes (Signed)
Per EMS, pre vitals: 164/78, hr 71, 100% neb, resp 22

## 2015-04-15 NOTE — Telephone Encounter (Signed)
Pt is requesting an Antibiotic.  She was just seem on 04/11/15 and given cough med.  She states that she is coughing up "yellow stuff and I feel warm like I have a fever".  Says that she has had this cough for weeks and it is getting worse. Mkenzie Dotts, Salome Spotted

## 2015-04-15 NOTE — ED Notes (Signed)
Bed: WA15 Expected date:  Expected time:  Means of arrival:  Comments: EMS-[facility] 

## 2015-04-15 NOTE — ED Notes (Signed)
EKG given to EDP,Liu,MD., for review.

## 2015-04-15 NOTE — ED Provider Notes (Signed)
CSN: [id number]     Arrival date & time 04/15/15  1733 History   First MD Initiated Contact with Patient 04/15/15 1758     Chief Complaint  Patient presents with  . Shortness of Breath     (Consider location/radiation/quality/duration/timing/severity/associated sxs/prior Treatment) HPI   82 year old female brought here via EMS from home who presents for evaluation of flulike symptoms. Patient report for the past 2 months she has had progressive productive cough with yellow sputum, shortness of breath throughout the day, body aches, and generalized weakness. She has been seen by her PCP for this who prescribed cough medication along with antibiotic 3 weeks ago which he took for 5 days but it provided no relief. Endorse sore throat due to persistent coughing .  She has been seen in the ED 5 days ago for the same complaint. She received breathing treatment in the ED and felt better and went home. Since then the symptoms resumed. She has been using albuterol inhaler 2 puffs twice daily without adequate relief. EMS was contacted today due to worsening shortness of breath.she is not on home O2. She has a history of CHF and has been compliant with her diuretic medication. She denies any increase leg swelling. Denies having fever, chills, hemoptysis, abdominal pain, nausea vomiting diarrhea, or rash.  Past Medical History  Diagnosis Date  . Coronary artery disease   . Hypertension   . Heart murmur   . Aortic stenosis   . CHF (congestive heart failure) ([place])   . Shortness of breath   . Renal mass 12/06/2010    CT Abdomen 11-27-10 upper pole region of the right kidney which is suspicious for solid lesion, measuring 1.9 x 1.3 cm. This lesion is concerning for renal cell carcinoma given the solid appearance.  Following with Dr [name].  Renal bx was benign    . Splenic infarct 12/06/2010  . Hyperlipidemia   . Myocardial infarction [facility]) 1995; 2003  . Stroke [facility]) 2003    "when I had heart surgery";  denies residual on 09/28/2014  . TIA (transient ischemic attack) "several"  . Arthritis     "maybe in my fingers and toes" (09/28/2014)  . Chronic renal insufficiency, stage III (moderate)     [name] 09/27/2014  . Renal cell carcinoma ([place]) 09/23/2012  . Macular degeneration, left eye   . Paroxysmal atrial fibrillation (HCC)   . Bradycardia    Past Surgical History  Procedure Laterality Date  . Cataract extraction w/ intraocular lens  implant, bilateral Bilateral ~ 2013  . Cardiac catheterization    . Cardioversion N/A 10/20/2013    Procedure: CARDIOVERSION;  Surgeon: Laverda Page, MD;  Location: [facility] ENDOSCOPY;  Service: Cardiovascular;  Laterality: N/A;  H&P in file  . Percutaneous needle biopsy of renal lesion  ?2014  . Left and right heart catheterization with coronary angiogram N/A 11/24/2013    Procedure: LEFT AND RIGHT HEART CATHETERIZATION WITH CORONARY ANGIOGRAM;  Surgeon: Laverda Page, MD;  Location: [facility] CATH LAB;  Service: Cardiovascular;  Laterality: N/A;  . Cardioversion N/A 05/18/2014    Procedure: CARDIOVERSION;  Surgeon: [name], MD;  Location: [place];  Service: Cardiovascular;  Laterality: N/A;  . Total abdominal hysterectomy  1990's    "both ovaries were full of little tiny sores"  . Tympanoplasty Bilateral     "had holes in them; still have holes in them"  . Coronary artery bypass graft  1995 and 2003  . Coronary angioplasty     Family History  Problem Relation Age of Onset  . Coronary artery disease Mother   . Coronary artery disease Brother   . Stroke Mother   . Heart attack Brother   . Heart attack Son   . Heart attack Daughter   . Heart attack Sister    Social History  Substance Use Topics  . Smoking status: Never Smoker   . Smokeless tobacco: Never Used  . Alcohol Use: No   OB History    No data available     Review of Systems  All other systems reviewed and are negative.     Allergies  Sulfamethoxazole-trimethoprim and  Tape  Home Medications   Prior to Admission medications   Medication Sig Start Date End Date Taking? Authorizing Provider  albuterol (PROVENTIL HFA;VENTOLIN HFA) 108 (90 Base) MCG/ACT inhaler Inhale 2 puffs into the lungs every 6 (six) hours as needed for wheezing or shortness of breath. 04/11/15  Yes Patrecia Pour, MD  amLODipine (NORVASC) 10 MG tablet Take 10 mg by mouth daily.  03/14/15  Yes Historical Provider, MD  apixaban (ELIQUIS) 5 MG TABS tablet Take 1 tablet (5 mg total) by mouth 2 (two) times daily. 08/21/13  Yes [name], MD  benzonatate (TESSALON) 100 MG capsule Take 1 capsule (100 mg total) by mouth every 8 (eight) hours. 04/10/15  Yes Quintella Reichert, MD  beta carotene w/minerals (OCUVITE) tablet Take 1 tablet by mouth daily.   Yes Historical Provider, MD  Cholecalciferol (VITAMIN D3) 2000 UNITS TABS Take 2,000 Units by mouth daily at 12 noon.    Yes Historical Provider, MD  CRANBERRY PO Take 2 capsules by mouth daily with lunch.   Yes Historical Provider, MD  dofetilide (TIKOSYN) 125 MCG capsule Take 1 capsule (125 mcg total) by mouth 2 (two) times daily. 09/30/14  Yes [name], MD  furosemide (LASIX) 20 MG tablet Take 1 tablet (20 mg total) by mouth daily. Daily until your hospital follow up. 03/05/15  Yes Rosemarie Ax, MD  guaiFENesin (MUCINEX) 600 MG 12 hr tablet Take 1 tablet (600 mg total) by mouth 2 (two) times daily. Patient taking differently: Take 600 mg by mouth 2 (two) times daily as needed for cough or to loosen phlegm.  03/16/15  Yes [name], MD  HYDROcodone-homatropine (HYCODAN) 5-1.5 MG/5ML syrup Take 5 mLs by mouth every 6 (six) hours as needed for cough. 04/11/15  Yes Patrecia Pour, MD  Multiple Vitamin (MULTIVITAMIN WITH MINERALS) TABS tablet Take 1 tablet by mouth daily.   Yes Historical Provider, MD  rosuvastatin (CRESTOR) 20 MG tablet TAKE 1 TABLET BY MOUTH DAILY Patient taking differently: TAKE 1 TABLET BY MOUTH every evening 03/14/15  Yes [name], MD  spironolactone (ALDACTONE) 25 MG tablet TAKE 1 TABLET BY MOUTH EVERY DAY. 03/14/15  Yes [name], MD  naproxen sodium (ANAPROX) 220 MG tablet Take 440 mg by mouth daily as needed (for headache).    Historical Provider, MD  Polyethyl Glycol-Propyl Glycol (SYSTANE OP) Place 1 drop into both eyes daily as needed (itching/ soreness in corner of eyes).    Historical Provider, MD  zolpidem (AMBIEN) 5 MG tablet Take 1 tablet (5 mg total) by mouth at bedtime as needed for sleep (Do not take more than 2-3 times per week). 03/18/15   [name], MD   BP 169/67 mmHg  Pulse 82  Temp(Src) 97.7 F (36.5 C) (Oral)  Resp 20  SpO2 100% Physical Exam  Constitutional: She appears well-developed  and well-nourished. No distress.  Caucasian female laying in bed in no acute discomfort.  HENT:  Head: Atraumatic.  Ears: Ears: TM perforation bilaterally, this is chronic Nose: Normal nares Throat: Posterior oropharyngeal erythema without exudates.  Eyes: Conjunctivae are normal.  Neck: Normal range of motion. Neck supple. No JVD present.  No nuchal rigidity  Cardiovascular: Normal rate and regular rhythm.   Pulmonary/Chest:  Patient is mildly tachypneic without accessory muscle use. Scattered rhonchi is with crackles heard at the left lower lung base and decreased breath sounds.  Abdominal: Soft. There is no tenderness.  Musculoskeletal: She exhibits no edema.  Neurological: She is alert.  Skin: No rash noted.  Psychiatric: She has a normal mood and affect.  Nursing note and vitals reviewed.   ED Course  Procedures (including critical care time) Labs Review Labs Reviewed  CBC WITH DIFFERENTIAL/PLATELET - Abnormal; Notable for the following:    WBC 11.6 (*)    All other components within normal limits  BASIC METABOLIC PANEL - Abnormal; Notable for the following:    CO2 21 (*)    Glucose, Bld 185 (*)    BUN 24 (*)    Creatinine, Ser 1.32 (*)    GFR calc non Af Amer  37 (*)    GFR calc Af Amer 43 (*)    Anion gap 17 (*)    All other components within normal limits  BRAIN NATRIURETIC PEPTIDE - Abnormal; Notable for the following:    B Natriuretic Peptide 443.9 (*)    All other components within normal limits  TROPONIN I  MAGNESIUM  INFLUENZA PANEL BY PCR (TYPE A & B, H1N1)    Imaging Review Dg Chest 2 View  04/15/2015  CLINICAL DATA:  Cough and short of breath EXAM: CHEST  2 VIEW COMPARISON:  04/10/2015 FINDINGS: Prior median sternotomy and CABG. Cardiac enlargement stable. Mild vascular congestion without edema or effusion. Negative for pneumonia. IMPRESSION: Pulmonary vascular congestion without edema. Electronically Signed   By: Franchot Gallo M.D.   On: 04/15/2015 18:56   I have personally reviewed and evaluated these images and lab results as part of my medical decision-making.   EKG Interpretation None     ED ECG REPORT   Date: 04/15/2015  Rate: 62  Rhythm: atrial fibrillation  QRS Axis: normal  Intervals: QT prolonged  ST/T Wave abnormalities: normal  Conduction Disutrbances:ventricular premature complex  Narrative Interpretation:   Old EKG Reviewed: unchanged  I have personally reviewed the EKG tracing and agree with the computerized printout as noted.   MDM   Final diagnoses:  Acute on chronic congestive heart failure, unspecified congestive heart failure type (HCC)    BP 151/60 mmHg  Pulse 82  Temp(Src) 97.7 F (36.5 C) (Oral)  Resp 17  SpO2 99%   Patient with progressive shortness of breath and productive cough not improved with antibiotic. She has history of CHF and currently taking Lasix. A chest x-ray shows pulmonary vascular congestion without edema. No signs of PNA.  Elevated BNP of 443. EKG without acute ischemic changes.  Pt ambulate while maintaining adequate O2 of 96% on RA.  She's afebrile with stable vital sign.    9:38 PM Suspect symptom is likely due to CHF exacerbation from a viral infection. I will  increase patient's Lasix from 20 mg daily to 40 mg daily for the next 3 days. She will follow-up with her cardiologist for further management of her condition. Cough medication provided.  Care discussed with Dr. Oleta Mouse who  has seen the pt and agrees.  Pt stable for discharge.    Domenic Moras, PA-C 04/15/15 2139  Forde Dandy, MD 04/16/15 0100

## 2015-04-15 NOTE — Telephone Encounter (Signed)
Continuance of last entry, I told pt that i would hang up so she could contact 911 and then ended the call. Katharina Caper, Mishon Blubaugh D, [place]

## 2015-04-15 NOTE — ED Notes (Signed)
Per EMS-states cold like symptoms for over a week-has been worked up and treated in ED and at [employer] not working

## 2015-04-15 NOTE — Telephone Encounter (Signed)
Patient calling again requesting to speak with nurse.

## 2015-04-15 NOTE — Telephone Encounter (Signed)
If she's having worsening symptoms she should be seen again. She may need an xray, but an exam and vitals is required first.

## 2015-04-15 NOTE — ED Notes (Signed)
Pt. Ambulated down the hall and back to her room on 96% room air without assistance. Pt. Gait steady on her feet. RN,Autumn made aware of oxygen sats.

## 2015-04-15 NOTE — Telephone Encounter (Signed)
Contacted pt to give her the below information and she stated that she needed help as soon as she answered the phone and that she was going to call the ambulance.  I asked her if she needed me to contact them and she stated that she was going to just press the button. Katharina Caper, April D, [place]

## 2015-04-18 ENCOUNTER — Observation Stay (HOSPITAL_COMMUNITY)
Admission: EM | Admit: 2015-04-18 | Discharge: 2015-04-19 | Disposition: A | Discharge status: Home / Self Care | Payer: Medicare Other | Attending: Family Medicine | Admitting: Family Medicine

## 2015-04-18 ENCOUNTER — Encounter (HOSPITAL_COMMUNITY): Payer: Self-pay

## 2015-04-18 ENCOUNTER — Emergency Department (HOSPITAL_COMMUNITY): Payer: Medicare Other

## 2015-04-18 DIAGNOSIS — Z8673 Personal history of transient ischemic attack (TIA), and cerebral infarction without residual deficits: Secondary | ICD-10-CM | POA: Diagnosis not present

## 2015-04-18 DIAGNOSIS — M1990 Unspecified osteoarthritis, unspecified site: Secondary | ICD-10-CM | POA: Insufficient documentation

## 2015-04-18 DIAGNOSIS — I5042 Chronic combined systolic (congestive) and diastolic (congestive) heart failure: Secondary | ICD-10-CM | POA: Insufficient documentation

## 2015-04-18 DIAGNOSIS — E872 Acidosis: Secondary | ICD-10-CM | POA: Insufficient documentation

## 2015-04-18 DIAGNOSIS — Z951 Presence of aortocoronary bypass graft: Secondary | ICD-10-CM | POA: Insufficient documentation

## 2015-04-18 DIAGNOSIS — I252 Old myocardial infarction: Secondary | ICD-10-CM | POA: Diagnosis not present

## 2015-04-18 DIAGNOSIS — Z882 Allergy status to sulfonamides status: Secondary | ICD-10-CM | POA: Diagnosis not present

## 2015-04-18 DIAGNOSIS — E7800 Pure hypercholesterolemia, unspecified: Secondary | ICD-10-CM | POA: Insufficient documentation

## 2015-04-18 DIAGNOSIS — I130 Hypertensive heart and chronic kidney disease with heart failure and stage 1 through stage 4 chronic kidney disease, or unspecified chronic kidney disease: Secondary | ICD-10-CM | POA: Diagnosis not present

## 2015-04-18 DIAGNOSIS — G4700 Insomnia, unspecified: Secondary | ICD-10-CM | POA: Insufficient documentation

## 2015-04-18 DIAGNOSIS — Z90710 Acquired absence of both cervix and uterus: Secondary | ICD-10-CM | POA: Diagnosis not present

## 2015-04-18 DIAGNOSIS — J111 Influenza due to unidentified influenza virus with other respiratory manifestations: Secondary | ICD-10-CM | POA: Diagnosis not present

## 2015-04-18 DIAGNOSIS — R531 Weakness: Secondary | ICD-10-CM | POA: Diagnosis not present

## 2015-04-18 DIAGNOSIS — I2510 Atherosclerotic heart disease of native coronary artery without angina pectoris: Secondary | ICD-10-CM | POA: Insufficient documentation

## 2015-04-18 DIAGNOSIS — J101 Influenza due to other identified influenza virus with other respiratory manifestations: Secondary | ICD-10-CM | POA: Diagnosis not present

## 2015-04-18 DIAGNOSIS — R918 Other nonspecific abnormal finding of lung field: Secondary | ICD-10-CM | POA: Diagnosis not present

## 2015-04-18 DIAGNOSIS — N183 Chronic kidney disease, stage 3 (moderate): Secondary | ICD-10-CM | POA: Insufficient documentation

## 2015-04-18 DIAGNOSIS — Z7901 Long term (current) use of anticoagulants: Secondary | ICD-10-CM | POA: Insufficient documentation

## 2015-04-18 DIAGNOSIS — Z85528 Personal history of other malignant neoplasm of kidney: Secondary | ICD-10-CM | POA: Insufficient documentation

## 2015-04-18 DIAGNOSIS — I350 Nonrheumatic aortic (valve) stenosis: Secondary | ICD-10-CM | POA: Insufficient documentation

## 2015-04-18 DIAGNOSIS — H3530 Unspecified macular degeneration: Secondary | ICD-10-CM | POA: Insufficient documentation

## 2015-04-18 DIAGNOSIS — Z888 Allergy status to other drugs, medicaments and biological substances status: Secondary | ICD-10-CM | POA: Insufficient documentation

## 2015-04-18 DIAGNOSIS — I480 Paroxysmal atrial fibrillation: Secondary | ICD-10-CM | POA: Insufficient documentation

## 2015-04-18 DIAGNOSIS — R0602 Shortness of breath: Secondary | ICD-10-CM | POA: Diagnosis not present

## 2015-04-18 LAB — CBC WITH DIFFERENTIAL/PLATELET
Basophils Absolute: 0.1 10*3/uL (ref 0.0–0.1)
Basophils Relative: 1 %
Eosinophils Absolute: 0.1 10*3/uL (ref 0.0–0.7)
Eosinophils Relative: 1 %
HCT: 42.1 % (ref 36.0–46.0)
Hemoglobin: 14.4 g/dL (ref 12.0–15.0)
Lymphocytes Relative: 25 %
Lymphs Abs: 3.4 10*3/uL (ref 0.7–4.0)
MCH: 30.4 pg (ref 26.0–34.0)
MCHC: 34.2 g/dL (ref 30.0–36.0)
MCV: 89 fL (ref 78.0–100.0)
Monocytes Absolute: 1.5 10*3/uL — ABNORMAL HIGH (ref 0.1–1.0)
Monocytes Relative: 11 %
Neutro Abs: 8.2 10*3/uL — ABNORMAL HIGH (ref 1.7–7.7)
Neutrophils Relative %: 62 %
Platelets: 246 10*3/uL (ref 150–400)
RBC: 4.73 MIL/uL (ref 3.87–5.11)
RDW: 13.5 % (ref 11.5–15.5)
WBC: 13.2 10*3/uL — ABNORMAL HIGH (ref 4.0–10.5)

## 2015-04-18 LAB — COMPREHENSIVE METABOLIC PANEL
ALT: 39 U/L (ref 14–54)
AST: 53 U/L — ABNORMAL HIGH (ref 15–41)
Albumin: 4.3 g/dL (ref 3.5–5.0)
Alkaline Phosphatase: 81 U/L (ref 38–126)
Anion gap: 16 — ABNORMAL HIGH (ref 5–15)
BUN: 36 mg/dL — ABNORMAL HIGH (ref 6–20)
CO2: 25 mmol/L (ref 22–32)
Calcium: 10.4 mg/dL — ABNORMAL HIGH (ref 8.9–10.3)
Chloride: 97 mmol/L — ABNORMAL LOW (ref 101–111)
Creatinine, Ser: 1.49 mg/dL — ABNORMAL HIGH (ref 0.44–1.00)
GFR calc Af Amer: 37 mL/min — ABNORMAL LOW (ref >60)
GFR calc non Af Amer: 32 mL/min — ABNORMAL LOW (ref >60)
Glucose, Bld: 163 mg/dL — ABNORMAL HIGH (ref 65–99)
Potassium: 4.3 mmol/L (ref 3.5–5.1)
Sodium: 138 mmol/L (ref 135–145)
Total Bilirubin: 1.3 mg/dL — ABNORMAL HIGH (ref 0.3–1.2)
Total Protein: 7.7 g/dL (ref 6.5–8.1)

## 2015-04-18 LAB — I-STAT CG4 LACTIC ACID, ED: Lactic Acid, Venous: 3.09 mmol/L (ref 0.5–2.0)

## 2015-04-18 LAB — I-STAT TROPONIN, ED: Troponin i, poc: 0.04 ng/mL (ref 0.00–0.08)

## 2015-04-18 LAB — LACTIC ACID, PLASMA
Lactic Acid, Venous: 1.6 mmol/L (ref 0.5–2.0)
Lactic Acid, Venous: 2.1 mmol/L (ref 0.5–2.0)

## 2015-04-18 LAB — BRAIN NATRIURETIC PEPTIDE: B Natriuretic Peptide: 210.1 pg/mL — ABNORMAL HIGH (ref 0.0–100.0)

## 2015-04-18 MED ORDER — GUAIFENESIN ER 600 MG PO TB12
600.0000 mg | ORAL_TABLET | Freq: Two times a day (BID) | ORAL | Status: DC
  Administered 2015-04-18 – 2015-04-19 (×3): 600 mg via ORAL
  Filled 2015-04-18 (×3): qty 1

## 2015-04-18 MED ORDER — PREDNISONE 20 MG PO TABS
40.0000 mg | ORAL_TABLET | Freq: Every day | ORAL | Status: DC
  Administered 2015-04-18 – 2015-04-19 (×2): 40 mg via ORAL
  Filled 2015-04-18 (×2): qty 2

## 2015-04-18 MED ORDER — DOFETILIDE 125 MCG PO CAPS
125.0000 ug | ORAL_CAPSULE | Freq: Two times a day (BID) | ORAL | Status: DC
  Administered 2015-04-18 – 2015-04-19 (×2): 125 ug via ORAL
  Filled 2015-04-18 (×3): qty 1

## 2015-04-18 MED ORDER — POLYETHYLENE GLYCOL 3350 17 G PO PACK: 17.0000 g | PACK | Freq: Every day | ORAL | Status: DC | PRN

## 2015-04-18 MED ORDER — OSELTAMIVIR PHOSPHATE 75 MG PO CAPS
75.0000 mg | ORAL_CAPSULE | Freq: Every day | ORAL | Status: DC
  Filled 2015-04-18: qty 1

## 2015-04-18 MED ORDER — SODIUM CHLORIDE 0.9% FLUSH
3.0000 mL | Freq: Two times a day (BID) | INTRAVENOUS | Status: DC
  Administered 2015-04-18: 3 mL via INTRAVENOUS

## 2015-04-18 MED ORDER — SODIUM CHLORIDE 0.9 % IV BOLUS (SEPSIS)
500.0000 mL | Freq: Once | INTRAVENOUS | Status: AC
  Administered 2015-04-18: 500 mL via INTRAVENOUS

## 2015-04-18 MED ORDER — IPRATROPIUM-ALBUTEROL 0.5-2.5 (3) MG/3ML IN SOLN
3.0000 mL | Freq: Four times a day (QID) | RESPIRATORY_TRACT | Status: DC
  Administered 2015-04-18 – 2015-04-19 (×4): 3 mL via RESPIRATORY_TRACT
  Filled 2015-04-18 (×5): qty 3

## 2015-04-18 MED ORDER — APIXABAN 5 MG PO TABS
5.0000 mg | ORAL_TABLET | Freq: Two times a day (BID) | ORAL | Status: DC
  Administered 2015-04-18 – 2015-04-19 (×2): 5 mg via ORAL
  Filled 2015-04-18 (×3): qty 1

## 2015-04-18 MED ORDER — ALBUTEROL SULFATE HFA 108 (90 BASE) MCG/ACT IN AERS: 2.0000 | INHALATION_SPRAY | Freq: Four times a day (QID) | RESPIRATORY_TRACT | Status: DC | PRN

## 2015-04-18 MED ORDER — ALBUTEROL SULFATE (2.5 MG/3ML) 0.083% IN NEBU: 2.5000 mg | INHALATION_SOLUTION | Freq: Four times a day (QID) | RESPIRATORY_TRACT | Status: DC | PRN

## 2015-04-18 MED ORDER — BENZONATATE 100 MG PO CAPS
100.0000 mg | ORAL_CAPSULE | Freq: Three times a day (TID) | ORAL | Status: DC
  Administered 2015-04-18 – 2015-04-19 (×4): 100 mg via ORAL
  Filled 2015-04-18 (×4): qty 1

## 2015-04-18 MED ORDER — IPRATROPIUM-ALBUTEROL 0.5-2.5 (3) MG/3ML IN SOLN
3.0000 mL | Freq: Once | RESPIRATORY_TRACT | Status: AC
  Administered 2015-04-18: 3 mL via RESPIRATORY_TRACT
  Filled 2015-04-18: qty 3

## 2015-04-18 MED ORDER — AMLODIPINE BESYLATE 10 MG PO TABS: 10.0000 mg | ORAL_TABLET | Freq: Every day | ORAL | Status: DC

## 2015-04-18 MED ORDER — ROSUVASTATIN CALCIUM 20 MG PO TABS
20.0000 mg | ORAL_TABLET | Freq: Every evening | ORAL | Status: DC
  Administered 2015-04-18: 20 mg via ORAL
  Filled 2015-04-18: qty 1

## 2015-04-18 MED ORDER — AMLODIPINE BESYLATE 10 MG PO TABS
10.0000 mg | ORAL_TABLET | Freq: Every day | ORAL | Status: DC
  Filled 2015-04-18: qty 1

## 2015-04-18 MED ORDER — SPIRONOLACTONE 25 MG PO TABS
25.0000 mg | ORAL_TABLET | Freq: Every day | ORAL | Status: DC
  Administered 2015-04-19: 25 mg via ORAL
  Filled 2015-04-18: qty 1

## 2015-04-18 MED ORDER — OSELTAMIVIR PHOSPHATE 30 MG PO CAPS
30.0000 mg | ORAL_CAPSULE | Freq: Two times a day (BID) | ORAL | Status: DC
  Administered 2015-04-18 – 2015-04-19 (×3): 30 mg via ORAL
  Filled 2015-04-18 (×4): qty 1

## 2015-04-18 MED ORDER — SODIUM CHLORIDE 0.9 % IV SOLN: INTRAVENOUS | Status: AC

## 2015-04-18 NOTE — ED Provider Notes (Signed)
CSN: [id number]     Arrival date & time 04/18/15  1144 History   First MD Initiated Contact with Patient 04/18/15 1145     Chief Complaint  Patient presents with  . Shortness of Breath  . Weakness     (Consider location/radiation/quality/duration/timing/severity/associated sxs/prior Treatment) The history is provided by the patient and a relative.  ARMELLA HARALSON is a 82 y.o. female hx of CAD, HTN, CHF, stroke, MI here with weakness, shortness of breath, dizziness. Patient has been coughing for the last month or so. Patient came here about a week ago and was diagnosed with bronchitis. Patient and came in 3 days ago was thought to have a CHF exacerbation and BNP was slightly elevated so Lasix was increased. Flu swab was also sent and came back positive for flu B after patient was discharged. Has been persistently feeling weak and tired. Some subjective chills but denies any high fevers. Continues to have a productive cough. Has decreased PO intake.    Past Medical History  Diagnosis Date  . Coronary artery disease   . Hypertension   . Heart murmur   . Aortic stenosis   . CHF (congestive heart failure) ([place])   . Shortness of breath   . Renal mass 12/06/2010    CT Abdomen 11-27-10 upper pole region of the right kidney which is suspicious for solid lesion, measuring 1.9 x 1.3 cm. This lesion is concerning for renal cell carcinoma given the solid appearance.  Following with Dr [name].  Renal bx was benign    . Splenic infarct 12/06/2010  . Hyperlipidemia   . Myocardial infarction [facility]) 1995; 2003  . Stroke [facility]) 2003    "when I had heart surgery"; denies residual on 09/28/2014  . TIA (transient ischemic attack) "several"  . Arthritis     "maybe in my fingers and toes" (09/28/2014)  . Chronic renal insufficiency, stage III (moderate)     [name] 09/27/2014  . Renal cell carcinoma ([place]) 09/23/2012  . Macular degeneration, left eye   . Paroxysmal atrial fibrillation (HCC)   . Bradycardia     Past Surgical History  Procedure Laterality Date  . Cataract extraction w/ intraocular lens  implant, bilateral Bilateral ~ 2013  . Cardiac catheterization    . Cardioversion N/A 10/20/2013    Procedure: CARDIOVERSION;  Surgeon: Laverda Page, MD;  Location: [facility] ENDOSCOPY;  Service: Cardiovascular;  Laterality: N/A;  H&P in file  . Percutaneous needle biopsy of renal lesion  ?2014  . Left and right heart catheterization with coronary angiogram N/A 11/24/2013    Procedure: LEFT AND RIGHT HEART CATHETERIZATION WITH CORONARY ANGIOGRAM;  Surgeon: Laverda Page, MD;  Location: [facility] CATH LAB;  Service: Cardiovascular;  Laterality: N/A;  . Cardioversion N/A 05/18/2014    Procedure: CARDIOVERSION;  Surgeon: [name], MD;  Location: [place];  Service: Cardiovascular;  Laterality: N/A;  . Total abdominal hysterectomy  1990's    "both ovaries were full of little tiny sores"  . Tympanoplasty Bilateral     "had holes in them; still have holes in them"  . Coronary artery bypass graft  1995 and 2003  . Coronary angioplasty     Family History  Problem Relation Age of Onset  . Coronary artery disease Mother   . Coronary artery disease Brother   . Stroke Mother   . Heart attack Brother   . Heart attack Son   . Heart attack Daughter   . Heart attack Sister    Social  History  Substance Use Topics  . Smoking status: Never Smoker   . Smokeless tobacco: Never Used  . Alcohol Use: No   OB History    No data available     Review of Systems  Respiratory: Positive for shortness of breath.   Neurological: Positive for weakness.  All other systems reviewed and are negative.     Allergies  Sulfamethoxazole-trimethoprim and Tape  Home Medications   Prior to Admission medications   Medication Sig Start Date End Date Taking? Authorizing Provider  albuterol (PROVENTIL HFA;VENTOLIN HFA) 108 (90 Base) MCG/ACT inhaler Inhale 2 puffs into the lungs every 6 (six) hours as needed for  wheezing or shortness of breath. 04/11/15   Patrecia Pour, MD  amLODipine (NORVASC) 10 MG tablet Take 10 mg by mouth daily.  03/14/15   Historical Provider, MD  apixaban (ELIQUIS) 5 MG TABS tablet Take 1 tablet (5 mg total) by mouth 2 (two) times daily. 08/21/13   [name], MD  benzonatate (TESSALON) 100 MG capsule Take 1 capsule (100 mg total) by mouth every 8 (eight) hours. 04/15/15   Domenic Moras, PA-C  beta carotene w/minerals (OCUVITE) tablet Take 1 tablet by mouth daily.    Historical Provider, MD  Cholecalciferol (VITAMIN D3) 2000 UNITS TABS Take 2,000 Units by mouth daily at 12 noon.     Historical Provider, MD  CRANBERRY PO Take 2 capsules by mouth daily with lunch.    Historical Provider, MD  dofetilide (TIKOSYN) 125 MCG capsule Take 1 capsule (125 mcg total) by mouth 2 (two) times daily. 09/30/14   [name], MD  furosemide (LASIX) 20 MG tablet Take 1 tablet (20 mg total) by mouth daily. Daily until your hospital follow up. 03/05/15   Rosemarie Ax, MD  guaiFENesin (MUCINEX) 600 MG 12 hr tablet Take 1 tablet (600 mg total) by mouth 2 (two) times daily. Patient taking differently: Take 600 mg by mouth 2 (two) times daily as needed for cough or to loosen phlegm.  03/16/15   [name], MD  HYDROcodone-homatropine (HYCODAN) 5-1.5 MG/5ML syrup Take 5 mLs by mouth every 6 (six) hours as needed for cough. 04/11/15   Patrecia Pour, MD  Multiple Vitamin (MULTIVITAMIN WITH MINERALS) TABS tablet Take 1 tablet by mouth daily.    Historical Provider, MD  naproxen sodium (ANAPROX) 220 MG tablet Take 440 mg by mouth daily as needed (for headache).    Historical Provider, MD  Polyethyl Glycol-Propyl Glycol (SYSTANE OP) Place 1 drop into both eyes daily as needed (itching/ soreness in corner of eyes).    Historical Provider, MD  rosuvastatin (CRESTOR) 20 MG tablet TAKE 1 TABLET BY MOUTH DAILY Patient taking differently: TAKE 1 TABLET BY MOUTH every evening 03/14/15   [name], MD  spironolactone  (ALDACTONE) 25 MG tablet TAKE 1 TABLET BY MOUTH EVERY DAY. 03/14/15   [name], MD  zolpidem (AMBIEN) 5 MG tablet Take 1 tablet (5 mg total) by mouth at bedtime as needed for sleep (Do not take more than 2-3 times per week). 03/18/15   [name], MD   BP 160/62 mmHg  Pulse 66  Temp(Src) 99.3 F (37.4 C) (Rectal)  Resp 24  SpO2 98% Physical Exam  Constitutional: She is oriented to person, place, and time.  Chronically ill, tired   HENT:  Head: Normocephalic.  MM slightly dry   Eyes: Conjunctivae are normal. Pupils are equal, round, and reactive to light.  Neck: Normal range of  motion. Neck supple.  Cardiovascular: Normal rate, regular rhythm and normal heart sounds.   Pulmonary/Chest:  Mild diffuse wheezing throughout, no retractions   Abdominal: Soft. Bowel sounds are normal. She exhibits no distension. There is no tenderness. There is no rebound.  Musculoskeletal: Normal range of motion. She exhibits no edema or tenderness.  Neurological: She is alert and oriented to person, place, and time. No cranial nerve deficit. Coordination normal.  Skin: Skin is warm and dry.  Psychiatric: She has a normal mood and affect. Her behavior is normal. Judgment and thought content normal.  Nursing note and vitals reviewed.   ED Course  Procedures (including critical care time) Labs Review Labs Reviewed  CBC WITH DIFFERENTIAL/PLATELET - Abnormal; Notable for the following:    WBC 13.2 (*)    Neutro Abs 8.2 (*)    Monocytes Absolute 1.5 (*)    All other components within normal limits  COMPREHENSIVE METABOLIC PANEL - Abnormal; Notable for the following:    Chloride 97 (*)    Glucose, Bld 163 (*)    BUN 36 (*)    Creatinine, Ser 1.49 (*)    Calcium 10.4 (*)    AST 53 (*)    Total Bilirubin 1.3 (*)    GFR calc non Af Amer 32 (*)    GFR calc Af Amer 37 (*)    Anion gap 16 (*)    All other components within normal limits  I-STAT CG4 LACTIC ACID, ED - Abnormal; Notable  for the following:    Lactic Acid, Venous 3.09 (*)    All other components within normal limits  BRAIN NATRIURETIC PEPTIDE  I-STAT TROPOININ, ED    Imaging Review Dg Chest 2 View  04/18/2015  CLINICAL DATA:  82 year old female with increasing shortness breath. Hypertension. Nonsmoker. Subsequent encounter. EXAM: CHEST  2 VIEW COMPARISON:  04/15/2015, 05/19/2014 and 02/06/2013 FINDINGS: Post CABG.  Prominent chronic cardiomegaly. Calcified tortuous aorta. Increased lung markings most notable lung bases greater on right similar to prior exam without segmental consolidation. Mild pulmonary vascular prominence most notable centrally and stable in appearance. Coronary artery calcifications. IMPRESSION: Chronic prominent cardiomegaly. Chronic lung markings without segmental consolidation. Pulmonary vascular prominence most notable centrally stable in appearance. Calcified tortuous aorta Electronically Signed   By: Genia Del M.D.   On: 04/18/2015 13:08   I have personally reviewed and evaluated these images and lab results as part of my medical decision-making.   EKG Interpretation None      MDM   Final diagnoses:  None    NINTI ANTILL is a 82 y.o. female here with SOB, wheezing. Flu positive. Likely flu vs bronchitis vs pneumonia. Will get labs, lactate, CXR. Will give nebs and reassess.   1:43 PM Labs showed lactate 3, WBC 14 but CXR showed no pneumonia. Cr slightly bumped to 1.5 from 1.3 likely from increase in lasix. Given 500 cc bolus. No wheezing after 1 neb. Still not feeling well though but never hypoxic. Will consult family practice to admit.      Wandra Arthurs, MD 04/18/15 1550

## 2015-04-18 NOTE — ED Notes (Signed)
[facility]- pt coming from home, increased shortness of breath, weakness, and anxiety. Pt recently dx with flu and continues to have weakness. O2 saturation of 100% on room air. Vitals stable with EMS, slightly tachypnic on arrival.

## 2015-04-18 NOTE — Progress Notes (Signed)
Notified On call provider of Lactic Acid. No orders received will continue to monitor.

## 2015-04-18 NOTE — H&P (Signed)
[facility] Admission History and Physical Service Pager: [phone]  Patient name: Jodi [name] Medical record number: [id number] Date of birth: October 01, 1932 Age: 82 y.o. Gender: female  Primary Care Provider: [name], MD Consultants: None  Code Status: FULL per discussion on admission  Chief Complaint: Generalized Weakness   Assessment and Plan: Jodi [name] is a 82 y.o. female presenting with generalized weakness in the setting of +Influenza B. PMH is significant for HTN, Renal cell carcinoma, HLD, HFrEF, CAD s/p CABG, paroxysmal afib on chronic anticoagulation and CKD.  Generalized Weakness/Fatigue: Patient presenting with generalized weakness, although strength is objectively preserved on exam. Tested positive for Influenza B on 3/10. Patient is afebrile with leukocytosis to 13.2. Patient is not anemic with a normal HgB of 14.4. Qsofa score of 0 at admission. Suspect subjective weakness 2/2 to influenza and dehydration as she does have an elevated lactic acid.  -admit to telemetry (given significant cardiac history), vital signs per unit with pulse ox checks  -repeat CBC and BMET in AM  -start Tamiflu 30 mg BID despite symptoms > 48 hours given risk factors of age and requiring hospitalization  - gentle IV hydration  - trend lactic acid -PT consult  -CM consult for [facility] needs: patient lives in [place] and gave SW contact named Olivia Mackie    Cough/Dyspnea: Given recent history of cough with improvement in wheezing with albuterol, likely a viral bronchitis. Patient appears to have DOE at her baseline, and this has not worsened. Suspect cough/respiratory discomfort worsened in the setting of her extensive cardiac history. BNP elevated to 210. This is improved from 3 days ago when it was 443. Patient has been taking increased amounts of lasix from home dose (20 mg BID instead of 20 mg daily). She does not appear fluid overloaded on  exam and CXR not concerning for pulmonary edema. Therefore, low suspicion for an acute CHF exacerbation.  -duonebs q6h, albuterol q2h prn  -tessalon and Mucinex  -prednisone 50 mg daily - this will hopefully have an added benefit of helping with her weakness   Lactic Acidosis: iStat lactic acid 3.09 in the ED. Suspect related to dehydration.  -gentle IVF hydration with NS 50 cc/hr x 12 hrs -trend lactic acid  Paroxysmal AFib: Currently rate controlled.  -EKG to assess QTc given Tikosyn use  - if QTc elevated as it was 3 days ago, consider touching base with cardiology.  -continue Tikosyn  -continue home Eliquis 5 mg BID  (if creatinine worsens any more, change Eliquis to 2.5mg  BID)  Severe Aortic Stenosis/HFrEF: Echo (08/2013) with LVEF 40-45%, G3DD, and severe aortic stenosis.  -continue home amlodipine 10 mg daily and spironolactone 25 mg daily  -holding home lasix while giving fluid repletion   CKD Stage III: Baseline Cr ~ 1.3. Cr mildly elevated to 1.49. Likely 2/2 to mild dehydration.   -IVF NS 50 cc/hr  -monitor Cr on AM BMET   HTN: Stable at admission.  -continue home amlodipine 10 mg daily and spironolactone 25 mg daily  -holding home lasix while giving fluid repletion   CAD s/p CABG: CABG in 1994. Stents in 2002. H/O Redo CABG 06/05/01. Lexiscan stress 12/15/10: Inferior, septal and distal anterior and apical scar. EF 47%. -continue Crestor 20 mg daily   Renal Cell Carcinoma: last imaging 2012 showing solid lesion in right kideny upper pole that was suspicious for renal cell carcinoma. Upon chart review(2012), she did not favor surgery    FEN/GI: NS at  50 cc/hr for 12 hours then SLIV, Heart Healthy Diet  Prophylaxis: Eliquis   Disposition: Admit to [facility]PTS; Attending [place]   History of Present Illness:  Jodi [name] is a 82 y.o. female presenting with fatigue, weakness, and cough  She's had a stable wet cough for over 2 months, but notes that she cannot bring too  much stuff up.  The other day the patient felt like she was "going to pass out." When asked to describe this she notes she felt "clammy." She enodrses feeling very tired and weak over the last few weeks. She has felt like she "is going to die." She has adequate hydration.   She has been taking Lasix 20mg  BID since being in the ED on 3/10 (previously on 20mg  daily).  No rhinorrhea, fever, abdominal pain, urinary symptoms. Endorses 1 episode of emesis a few says ago.  Stable LE swelling. She's had SOB for several months that worsens with exertion, notes it is no better or worse over the last 2 months.   In the ED her vitals were stable. She appeared to have some work of breathing with ambulation despite maintaining her O2 stats on room air.  She had a leukocytosis up to 13.2 with a lactic acid of 3.09. She was given a 500cc bolus. CXR revealed Chronic lung markings without segmental consolidation and pulmonary vascular prominence most notable centrally stable in appearance.  Review Of Systems: Per HPI with the following additions: None  Otherwise the remainder of the systems were negative.  Patient Active Problem List   Diagnosis Date Noted  . Influenza 04/18/2015  . Acute bronchitis due to other specified organisms 04/11/2015  . SOB (shortness of breath) 03/05/2015  . Chronic combined systolic and diastolic congestive heart failure ([place]) 03/04/2015  . Left renal mass   . Paroxysmal atrial fibrillation ([place]) 08/20/2013  . Renal cell carcinoma ([place]) 09/23/2012  . Chronic kidney disease (CKD), stage III (moderate) 02/20/2012  . Aortic stenosis, severe 12/06/2010  . SEBORRHEA 11/10/2007  . INSOMNIA 06/09/2007  . HYPERCHOLESTEROLEMIA 04/04/2006  . HYPERTENSION, BENIGN SYSTEMIC 04/04/2006  . CORONARY, ARTERIOSCLEROSIS 04/04/2006  . Acute combined systolic and diastolic heart failure ([place]) 04/04/2006  . OSTEOARTHRITIS, MULTI SITES 04/04/2006    Past Medical History: Past Medical History   Diagnosis Date  . Coronary artery disease   . Hypertension   . Heart murmur   . Aortic stenosis   . CHF (congestive heart failure) ([place])   . Shortness of breath   . Renal mass 12/06/2010    CT Abdomen 11-27-10 upper pole region of the right kidney which is suspicious for solid lesion, measuring 1.9 x 1.3 cm. This lesion is concerning for renal cell carcinoma given the solid appearance.  Following with Dr [name].  Renal bx was benign    . Splenic infarct 12/06/2010  . Hyperlipidemia   . Myocardial infarction [facility]) 1995; 2003  . Stroke [facility]) 2003    "when I had heart surgery"; denies residual on 09/28/2014  . TIA (transient ischemic attack) "several"  . Arthritis     "maybe in my fingers and toes" (09/28/2014)  . Chronic renal insufficiency, stage III (moderate)     [name] 09/27/2014  . Renal cell carcinoma ([place]) 09/23/2012  . Macular degeneration, left eye   . Paroxysmal atrial fibrillation (HCC)   . Bradycardia     Past Surgical History: Past Surgical History  Procedure Laterality Date  . Cataract extraction w/ intraocular lens  implant, bilateral Bilateral ~ 2013  .  Cardiac catheterization    . Cardioversion N/A 10/20/2013    Procedure: CARDIOVERSION;  Surgeon: Laverda Page, MD;  Location: [facility] ENDOSCOPY;  Service: Cardiovascular;  Laterality: N/A;  H&P in file  . Percutaneous needle biopsy of renal lesion  ?2014  . Left and right heart catheterization with coronary angiogram N/A 11/24/2013    Procedure: LEFT AND RIGHT HEART CATHETERIZATION WITH CORONARY ANGIOGRAM;  Surgeon: Laverda Page, MD;  Location: [facility] CATH LAB;  Service: Cardiovascular;  Laterality: N/A;  . Cardioversion N/A 05/18/2014    Procedure: CARDIOVERSION;  Surgeon: [name], MD;  Location: [place];  Service: Cardiovascular;  Laterality: N/A;  . Total abdominal hysterectomy  1990's    "both ovaries were full of little tiny sores"  . Tympanoplasty Bilateral     "had holes in them; still have holes in  them"  . Coronary artery bypass graft  1995 and 2003  . Coronary angioplasty      Social History: Social History  Substance Use Topics  . Smoking status: Never Smoker   . Smokeless tobacco: Never Used  . Alcohol Use: No   Additional social history: Never smoked. Lives in an apartment complex for the elderly ([place]).  Please also refer to relevant sections of EMR.  Family History: Family History  Problem Relation Age of Onset  . Coronary artery disease Mother   . Coronary artery disease Brother   . Stroke Mother   . Heart attack Brother   . Heart attack Son   . Heart attack Daughter   . Heart attack Sister     Allergies and Medications: Allergies  Allergen Reactions  . Sulfamethoxazole-Trimethoprim Other (See Comments)     shaking /chills  . Tape Other (See Comments)    Tears skin.  Please use "paper" tape   No current facility-administered medications on file prior to encounter.   Current Outpatient Prescriptions on File Prior to Encounter  Medication Sig Dispense Refill  . albuterol (PROVENTIL HFA;VENTOLIN HFA) 108 (90 Base) MCG/ACT inhaler Inhale 2 puffs into the lungs every 6 (six) hours as needed for wheezing or shortness of breath. 1 Inhaler 0  . amLODipine (NORVASC) 10 MG tablet Take 10 mg by mouth daily.     Marland Kitchen apixaban (ELIQUIS) 5 MG TABS tablet Take 1 tablet (5 mg total) by mouth 2 (two) times daily. 60 tablet 1  . benzonatate (TESSALON) 100 MG capsule Take 1 capsule (100 mg total) by mouth every 8 (eight) hours. 21 capsule 0  . beta carotene w/minerals (OCUVITE) tablet Take 1 tablet by mouth daily.    . Cholecalciferol (VITAMIN D3) 2000 UNITS TABS Take 2,000 Units by mouth daily at 12 noon.     Marland Kitchen CRANBERRY PO Take 2 capsules by mouth daily with lunch.    . dofetilide (TIKOSYN) 125 MCG capsule Take 1 capsule (125 mcg total) by mouth 2 (two) times daily. 14 capsule 0  . furosemide (LASIX) 20 MG tablet Take 1 tablet (20 mg total) by mouth daily.  Daily until your hospital follow up. 30 tablet 0  . guaiFENesin (MUCINEX) 600 MG 12 hr tablet Take 1 tablet (600 mg total) by mouth 2 (two) times daily. (Patient taking differently: Take 600 mg by mouth 2 (two) times daily as needed for cough or to loosen phlegm. ) 60 tablet 1  . HYDROcodone-homatropine (HYCODAN) 5-1.5 MG/5ML syrup Take 5 mLs by mouth every 6 (six) hours as needed for cough. 120 mL 0  . Multiple Vitamin (MULTIVITAMIN WITH  MINERALS) TABS tablet Take 1 tablet by mouth daily.    . naproxen sodium (ANAPROX) 220 MG tablet Take 440 mg by mouth daily as needed (for headache).    Vladimir Faster Glycol-Propyl Glycol (SYSTANE OP) Place 1 drop into both eyes daily as needed (itching/ soreness in corner of eyes).    . rosuvastatin (CRESTOR) 20 MG tablet TAKE 1 TABLET BY MOUTH DAILY (Patient taking differently: TAKE 1 TABLET BY MOUTH every evening) 90 tablet 2  . spironolactone (ALDACTONE) 25 MG tablet TAKE 1 TABLET BY MOUTH EVERY DAY. 90 tablet 1  . zolpidem (AMBIEN) 5 MG tablet Take 1 tablet (5 mg total) by mouth at bedtime as needed for sleep (Do not take more than 2-3 times per week). 25 tablet 3    Objective: BP 129/59 mmHg  Pulse 68  Temp(Src) 99.3 F (37.4 C) (Rectal)  Resp 24  SpO2 96% Exam: General: Elderly female lying in bed, non-toxic, appears tired and pale.  Eyes: EOMI. PERRL.  ENTM: MMM. Oropharynx clear. Perforations in both TMs. Cerumen impaction in left ear. Hearing aid in left ear.  Neck: Full ROM. Supple.  Cardiovascular: RRR. 3/6 harsh systolic murmur present. No LE edema present.  2+ pedal pulses.  Respiratory: Coarse breath sounds bilaterally with occasional wheeze appreciated. Has very mild supraclavicular retractions Abdomen: +BS, soft, NTND  MSK: Moves all extremities spontaneously.  Skin: Warm and dry.  Neuro: Grip strength 5/5. Strength 5/5 in all extremities. No focal deficits.  Psych: Appropriate mood and affect. Answers questions appropriately.   Labs  and Imaging: CBC BMET   Recent Labs Lab 04/18/15 1200  WBC 13.2*  HGB 14.4  HCT 42.1  PLT 246    Recent Labs Lab 04/18/15 1200  NA 138  K 4.3  CL 97*  CO2 25  BUN 36*  CREATININE 1.49*  GLUCOSE 163*  CALCIUM 10.4*     iStat Troponin: 0.04 iStat Lactic Acid: 3.09  BNP 210.1   Dg Chest 2 View  04/18/2015  CLINICAL DATA:  82 year old female with increasing shortness breath. Hypertension. Nonsmoker. Subsequent encounter. EXAM: CHEST  2 VIEW COMPARISON:  04/15/2015, 05/19/2014 and 02/06/2013 FINDINGS: Post CABG.  Prominent chronic cardiomegaly. Calcified tortuous aorta. Increased lung markings most notable lung bases greater on right similar to prior exam without segmental consolidation. Mild pulmonary vascular prominence most notable centrally and stable in appearance. Coronary artery calcifications. IMPRESSION: Chronic prominent cardiomegaly. Chronic lung markings without segmental consolidation. Pulmonary vascular prominence most notable centrally stable in appearance. Calcified tortuous aorta Electronically Signed   By: Genia Del M.D.   On: 04/18/2015 13:08    Nicolette Bang, DO 04/18/2015, 3:53 PM PGY-1, [place] Intern pager: [phone], text pages welcome  Upper Level Addendum:  I have seen and evaluated this patient along with Dr. Juleen China and reviewed the above note, making necessary revisions in purple.   Archie Patten, MD [place] Resident, PGY-2

## 2015-04-18 NOTE — Progress Notes (Signed)
CRITICAL VALUE ALERT  Critical value received:  Lactic acid 2.1  Date of notification:  04/18/2015  Time of notification:  1826  Critical value read back: YES  Nurse who received alert:  Precious Gilding, RN   MD notified (1st page):  Kathrine Cords   Time of first page:  2015  MD notified (2nd page):   Time of second page:  Responding MD:  Kathrine Cords  Time MD responded:  2018

## 2015-04-18 NOTE — Progress Notes (Signed)
Pt admitted to the unit at [facility]. Pt mental status is A&Ox4. Pt oriented to room, staff, and call bell. Skin is intact. Full assessment charted in [facility]. Call bell within reach. Visitor guidelines reviewed w/ pt and/or family.

## 2015-04-18 NOTE — ED Notes (Signed)
Admitting MD at bedside.

## 2015-04-18 NOTE — ED Notes (Signed)
MD at bedside. 

## 2015-04-18 NOTE — ED Notes (Signed)
Pt ambulated in hallway, pt O2 stayed at 99% with pulse ranging from 74-80. Pt only complaint was feeling dizzy during the walk.

## 2015-04-19 DIAGNOSIS — J111 Influenza due to unidentified influenza virus with other respiratory manifestations: Secondary | ICD-10-CM | POA: Diagnosis not present

## 2015-04-19 DIAGNOSIS — J101 Influenza due to other identified influenza virus with other respiratory manifestations: Secondary | ICD-10-CM | POA: Diagnosis not present

## 2015-04-19 LAB — BASIC METABOLIC PANEL
Anion gap: 15 (ref 5–15)
BUN: 32 mg/dL — ABNORMAL HIGH (ref 6–20)
CO2: 24 mmol/L (ref 22–32)
Calcium: 9.8 mg/dL (ref 8.9–10.3)
Chloride: 100 mmol/L — ABNORMAL LOW (ref 101–111)
Creatinine, Ser: 1.38 mg/dL — ABNORMAL HIGH (ref 0.44–1.00)
GFR calc Af Amer: 41 mL/min — ABNORMAL LOW (ref >60)
GFR calc non Af Amer: 35 mL/min — ABNORMAL LOW (ref >60)
Glucose, Bld: 190 mg/dL — ABNORMAL HIGH (ref 65–99)
Potassium: 4.5 mmol/L (ref 3.5–5.1)
Sodium: 139 mmol/L (ref 135–145)

## 2015-04-19 LAB — CBC
HCT: 38.8 % (ref 36.0–46.0)
Hemoglobin: 13.1 g/dL (ref 12.0–15.0)
MCH: 30 pg (ref 26.0–34.0)
MCHC: 33.8 g/dL (ref 30.0–36.0)
MCV: 89 fL (ref 78.0–100.0)
Platelets: 220 10*3/uL (ref 150–400)
RBC: 4.36 MIL/uL (ref 3.87–5.11)
RDW: 13.3 % (ref 11.5–15.5)
WBC: 11 10*3/uL — ABNORMAL HIGH (ref 4.0–10.5)

## 2015-04-19 LAB — LACTIC ACID, PLASMA: Lactic Acid, Venous: 1.8 mmol/L (ref 0.5–2.0)

## 2015-04-19 MED ORDER — PREDNISONE 20 MG PO TABS
40.0000 mg | ORAL_TABLET | Freq: Every day | ORAL | Status: DC
Start: 2015-04-19 — End: 2015-11-09

## 2015-04-19 MED ORDER — PREDNISONE 20 MG PO TABS: 40.0000 mg | ORAL_TABLET | Freq: Every day | ORAL | Status: DC

## 2015-04-19 MED ORDER — OSELTAMIVIR PHOSPHATE 30 MG PO CAPS: 30.0000 mg | ORAL_CAPSULE | Freq: Two times a day (BID) | ORAL | Status: DC

## 2015-04-19 MED ORDER — ZOLPIDEM TARTRATE 5 MG PO TABS
5.0000 mg | ORAL_TABLET | Freq: Once | ORAL | Status: AC
  Administered 2015-04-19: 5 mg via ORAL
  Filled 2015-04-19: qty 1

## 2015-04-19 NOTE — Progress Notes (Signed)
NURSING PROGRESS NOTE  ALORA LOCKLEAR [id number] Discharge Data: 04/19/2015 1:20 PM Attending Provider: [name], MD [id number] Carlean Jews, MD     [name] to be D/C'd Home per MD order.  Discussed with the patient the After Visit Summary and all questions fully answered. All IV's discontinued with no bleeding noted. All belongings returned to patient for patient to take home.   Last Vital Signs:  Blood pressure 98/65, pulse 65, temperature 97.6 F (36.4 C), temperature source Oral, resp. rate 18, height 5\' 3"  (1.6 m), weight 72.5 kg (159 lb 13.3 oz), SpO2 96 %.  Discharge Medication List   Medication List    STOP taking these medications        HYDROcodone-homatropine 5-1.5 MG/5ML syrup  Commonly known as:  HYCODAN      TAKE these medications        albuterol 108 (90 Base) MCG/ACT inhaler  Commonly known as:  PROVENTIL HFA;VENTOLIN HFA  Inhale 2 puffs into the lungs every 6 (six) hours as needed for wheezing or shortness of breath.     amLODipine 10 MG tablet  Commonly known as:  NORVASC  Take 10 mg by mouth daily.     apixaban 5 MG Tabs tablet  Commonly known as:  ELIQUIS  Take 1 tablet (5 mg total) by mouth 2 (two) times daily.     benzonatate 100 MG capsule  Commonly known as:  TESSALON  Take 1 capsule (100 mg total) by mouth every 8 (eight) hours.     beta carotene w/minerals tablet  Take 1 tablet by mouth daily.     CRANBERRY PO  Take 2 capsules by mouth daily with lunch.     dofetilide 125 MCG capsule  Commonly known as:  TIKOSYN  Take 1 capsule (125 mcg total) by mouth 2 (two) times daily.     furosemide 20 MG tablet  Commonly known as:  LASIX  Take 1 tablet (20 mg total) by mouth daily. Daily until your hospital follow up.     guaiFENesin 600 MG 12 hr tablet  Commonly known as:  MUCINEX  Take 1 tablet (600 mg total) by mouth 2 (two) times daily.     multivitamin with minerals Tabs tablet  Take 1 tablet by mouth daily.     naproxen sodium 220 MG tablet  Commonly known as:  ANAPROX  Take 440 mg by mouth daily as needed (for headache).     oseltamivir 30 MG capsule  Commonly known as:  TAMIFLU  Take 1 capsule (30 mg total) by mouth 2 (two) times daily.     predniSONE 20 MG tablet  Commonly known as:  DELTASONE  Take 2 tablets (40 mg total) by mouth daily with breakfast.     rosuvastatin 20 MG tablet  Commonly known as:  CRESTOR  TAKE 1 TABLET BY MOUTH DAILY     spironolactone 25 MG tablet  Commonly known as:  ALDACTONE  TAKE 1 TABLET BY MOUTH EVERY DAY.     SYSTANE OP  Place 1 drop into both eyes daily as needed (itching/ soreness in corner of eyes).     Vitamin D3 2000 units Tabs  Take 2,000 Units by mouth daily at 12 noon.     zolpidem 5 MG tablet  Commonly known as:  AMBIEN  Take 1 tablet (5 mg total) by mouth at bedtime as needed for sleep (Do not take more than 2-3 times per week).  Charolette Child, RN

## 2015-04-19 NOTE — Care Management Obs Status (Signed)
[facility] NOTIFICATION   Patient Details  Name: Jodi [name] MRN: [id number] Date of Birth: [birth year]   Medicare Observation Status Notification Given:  Yes    Carles Collet, RN 04/19/2015, 11:14 AM

## 2015-04-19 NOTE — Care Management Note (Signed)
Case Management Note  Patient Details  Name: Jodi [name] MRN: [id number] Date of Birth: 03/17/1932  Subjective/Objective:                 Independent patient from home, who lives alone in [place] for the Flu. Spoke with patient, she denies needing any CM resources.   Action/Plan:   Anticipate DC to home today.  Expected Discharge Date:                  Expected Discharge Plan:  Home/Self Care  In-House Referral:     Discharge planning Services  CM Consult  Post Acute Care Choice:    Choice offered to:     DME Arranged:    DME Agency:     HH Arranged:    [place] Agency:     Status of Service:  Completed, signed off  Medicare Important Message Given:    Date Medicare IM Given:    Medicare IM give by:    Date Additional Medicare IM Given:    Additional Medicare Important Message give by:     If discussed at [place] of Stay Meetings, dates discussed:    Additional Comments:  Carles Collet, RN 04/19/2015, 12:32 PM

## 2015-04-19 NOTE — Discharge Instructions (Signed)
You were hospitalized for weakness and dehydration from the flu. We treated you with IV fluids. We have also prescribed Tamiflu which you should take for 8 more doses. You were also prescribed prednisone which you should take for 3 more days. Start taking your lasix again at your prescribed at your dose of 20 mg.

## 2015-04-19 NOTE — Evaluation (Signed)
Physical Therapy Evaluation & Discharge Patient Details Name: Jodi [name] MRN: [id number] DOB: [birth year] Today's Date: 04/19/2015   History of Present Illness  Jodi [name] is a 82 y.o. female presenting with generalized weakness in the setting of +Influenza B. PMH is significant for HTN, Renal cell carcinoma, HLD, HFrEF, CAD s/p CABG, paroxysmal afib on chronic anticoagulation and CKD.  Clinical Impression  Patient presents with generalized weakness from viral illness, but currently no skilled PT needs as mobilizing independently.  Encouraged modification of ADL/IADL's for safety and energy conservation while recouperating from acute illness.  States she can call family for support as needed for transportation to appointments, groceries, etc.  No further skilled PT needs at this time.  Will sign off.    Follow Up Recommendations No PT follow up    Equipment Recommendations  None recommended by PT    Recommendations for Other Services       Precautions / Restrictions Precautions Precautions: None      Mobility  Bed Mobility               General bed mobility comments: up in chair  Transfers Overall transfer level: Modified independent Equipment used: None             General transfer comment: stood prior to my entering room  Ambulation/Gait Ambulation/Gait assistance: Independent Ambulation Distance (Feet): 200 Feet Assistive device: None Gait Pattern/deviations: Step-through pattern;Decreased stride length     General Gait Details: slow, but steady and without UE support needed.  Stairs Stairs: Yes Stairs assistance: Supervision Stair Management: One rail Right;Forwards;Step to pattern Number of Stairs: 3 General stair comments: assist for safety esp with IV  Wheelchair Mobility    Modified Rankin (Stroke Patients Only)       Balance Overall balance assessment: Needs assistance   Sitting balance-Leahy Scale: Normal        Standing balance-Leahy Scale: Good Standing balance comment: able to bend and pick up item from floor unaided                             Pertinent Vitals/Pain Pain Assessment: No/denies pain    Home Living Family/patient expects to be discharged to:: Private residence Living Arrangements: Alone Available Help at Discharge: Family;Available PRN/intermittently Type of Home: Apartment (senior living apartments) Home Access: Stairs to enter Entrance Stairs-Rails: Psychiatric nurse of Steps: 3 Home Layout: One level Home Equipment: Cane - single point;Walker - 2 wheels;Grab bars - tub/shower      Prior Function Level of Independence: Independent               Hand Dominance        Extremity/Trunk Assessment               Lower Extremity Assessment: Overall WFL for tasks assessed         Communication      Cognition Arousal/Alertness: Awake/alert Behavior During Therapy: WFL for tasks assessed/performed Overall Cognitive Status: Within Functional Limits for tasks assessed                      General Comments      Exercises        Assessment/Plan    PT Assessment Patent does not need any further PT services  PT Diagnosis Generalized weakness   PT Problem List    PT Treatment Interventions     PT Goals (Current goals can  be found in the Care Plan section) Acute Rehab PT Goals PT Goal Formulation: All assessment and education complete, DC therapy    Frequency     Barriers to discharge        Co-evaluation               End of Session   Activity Tolerance: Patient tolerated treatment well Patient left: in chair;with call bell/phone within reach      Functional Assessment Tool Used: Clinical Judgement Functional Limitation: Mobility: Walking and moving around Mobility: Walking and Moving Around Current Status [id number]): At least 1 percent but less than 20 percent impaired, limited or  restricted Mobility: Walking and Moving Around Goal Status [phone]): At least 1 percent but less than 20 percent impaired, limited or restricted Mobility: Walking and Moving Around Discharge Status [phone]): At least 1 percent but less than 20 percent impaired, limited or restricted    Time: 1000-1025 PT Time Calculation (min) (ACUTE ONLY): 25 min   Charges:   PT Evaluation $PT Eval Moderate Complexity: 1 Procedure PT Treatments $Gait Training: 8-22 mins   PT G Codes:   PT G-Codes **NOT FOR INPATIENT CLASS** Functional Assessment Tool Used: Clinical Judgement Functional Limitation: Mobility: Walking and moving around Mobility: Walking and Moving Around Current Status [id number]): At least 1 percent but less than 20 percent impaired, limited or restricted Mobility: Walking and Moving Around Goal Status [phone]): At least 1 percent but less than 20 percent impaired, limited or restricted Mobility: Walking and Moving Around Discharge Status [phone]): At least 1 percent but less than 20 percent impaired, limited or restricted    Reginia Naas 04/19/2015, 11:07 AM  Magda Kiel, [place] 04/19/2015

## 2015-04-19 NOTE — Progress Notes (Signed)
Family Medicine Teaching Service Daily Progress Note Intern Pager: [phone]  Patient name: Jodi [name] Medical record number: [id number] Date of birth: [birth year] Age: 82 y.o. Gender: female  Primary Care Provider: [name], MD Consultants: None  Code Status: FULL   Pt Overview and Major Events to Date:  3/13: Patient admitted for generalized weakness/fatigue/dehyrdation 2/2 to influenza   Assessment and Plan: Jodi [name] is a 82 y.o. female presenting with generalized weakness in the setting of +Influenza B. PMH is significant for HTN, Renal cell carcinoma, HLD, HFrEF, CAD s/p CABG, paroxysmal afib on chronic anticoagulation and CKD.  Generalized Weakness/Fatigue: Patient presenting with generalized weakness, although strength is objectively preserved on exam. Tested positive for Influenza B on 3/10. Patient is afebrile with leukocytosis to 13.2. Patient is not anemic with a normal HgB of 14.4. Qsofa score of 0 at admission. Suspect subjective weakness 2/2 to influenza and dehydration as she does have an elevated lactic acid.  -admit to telemetry (given significant cardiac history), vital signs per unit with pulse ox checks  -repeat CBC and BMET in AM: WBC improved to 11.0 (likely some component of dilution with IVF)  -start Tamiflu 30 mg BID despite symptoms > 48 hours given risk factors of age and requiring hospitalization  - gentle IV hydration for 12 hours completed  - trend lactic acid: 3.09 >> 1.6 >> 2.1 >> 1.8  -PT consult  -CM consult for [facility] needs: patient lives in [place] and gave SW contact named Olivia Mackie   Cough/Dyspnea: Given recent history of cough with improvement in wheezing with albuterol, likely a viral bronchitis. Patient appears to have DOE at her baseline, and this has not worsened. Suspect cough/respiratory discomfort worsened in the setting of her extensive cardiac history. BNP elevated to 210. This is improved from 3 days ago  when it was 443. Patient has been taking increased amounts of lasix from home dose (20 mg BID instead of 20 mg daily). She does not appear fluid overloaded on exam and CXR not concerning for pulmonary edema. Therefore, low suspicion for an acute CHF exacerbation.  -duonebs q6h, albuterol q2h prn  -tessalon and Mucinex  -prednisone 50 mg daily - this will hopefully have an added benefit of helping with her weakness   Lactic Acidosis, Resolved: iStat lactic acid 3.09 in the ED. Suspect related to dehydration.   Paroxysmal AFib: Currently rate controlled.  -EKG to assess QTc given Tikosyn use: QTc of 470   -continue home Eliquis 5 mg BID (if creatinine worsens any more, change Eliquis to 2.5mg  BID)  Severe Aortic Stenosis/HFrEF: Echo (08/2013) with LVEF 40-45%, G3DD, and severe aortic stenosis.  -continue home amlodipine 10 mg daily and spironolactone 25 mg daily  -holding home lasix while giving fluid repletion   CKD Stage III: Baseline Cr ~ 1.3. Cr mildly elevated to 1.49. Likely 2/2 to mild dehydration.  -Cr improved to 1.38 after IVFs  -monitor Cr on AM BMET   HTN: Stable at admission. Elevated overnight (151-172/62-69)./  -continue home amlodipine 10 mg daily and spironolactone 25 mg daily  -holding home lasix while giving fluid repletion   CAD s/p CABG: CABG in 1994. Stents in 2002. H/O Redo CABG 06/05/01. Lexiscan stress 12/15/10: Inferior, septal and distal anterior and apical scar. EF 47%. -continue Crestor 20 mg daily   Renal Cell Carcinoma: last imaging 2012 showing solid lesion in right kideny upper pole that was suspicious for renal cell carcinoma. Upon chart review(2012), she did not favor surgery  FEN/GI: SLIV, Heart Healthy Diet  Prophylaxis: Eliquis   Disposition: pending clinical improvement and PT consult   Subjective:  Feeling better this morning. Was unable to get much sleep due to lab draws, etc. But feels like she has a bit more energy.    Objective: Temp:  [97.4 F (36.3 C)-99.3 F (37.4 C)] 97.6 F (36.4 C) (03/14 0538) Pulse Rate:  [57-69] 65 (03/14 0538) Resp:  [12-24] 18 (03/14 0538) BP: (122-172)/(59-103) 167/67 mmHg (03/14 0538) SpO2:  [91 %-100 %] 98 % (03/14 0538) Weight:  [159 lb 13.3 oz (72.5 kg)-160 lb (72.576 kg)] 159 lb 13.3 oz (72.5 kg) (03/14 0454) Physical Exam: General: Elderly female sitting on side of bed, not as pale as at admission  Cardiovascular: RRR. 3/6 harsh systolic murmur present.  Respiratory: Minimally coarse breath sounds, improved from yesterday.Normal WOB.  Abdomen: soft, NTND  Extremities: No LE edema.   Laboratory:  Recent Labs Lab 04/15/15 1839 04/18/15 1200 04/19/15 0558  WBC 11.6* 13.2* 11.0*  HGB 14.3 14.4 13.1  HCT 41.6 42.1 38.8  PLT 229 246 220    Recent Labs Lab 04/15/15 1839 04/18/15 1200 04/19/15 0558  NA 139 138 139  K 3.8 4.3 4.5  CL 101 97* 100*  CO2 21* 25 24  BUN 24* 36* 32*  CREATININE 1.32* 1.49* 1.38*  CALCIUM 9.8 10.4* 9.8  PROT  --  7.7  --   BILITOT  --  1.3*  --   ALKPHOS  --  81  --   ALT  --  39  --   AST  --  53*  --   GLUCOSE 185* 163* 190*   iStat Troponin: 0.04 iStat Lactic Acid: 3.09  BNP 210.1   Imaging/Diagnostic Tests: Dg Chest 2 View  04/18/2015 CLINICAL DATA: 82 year old female with increasing shortness breath. Hypertension. Nonsmoker. Subsequent encounter. EXAM: CHEST 2 VIEW COMPARISON: 04/15/2015, 05/19/2014 and 02/06/2013 FINDINGS: Post CABG. Prominent chronic cardiomegaly. Calcified tortuous aorta. Increased lung markings most notable lung bases greater on right similar to prior exam without segmental consolidation. Mild pulmonary vascular prominence most notable centrally and stable in appearance. Coronary artery calcifications. IMPRESSION: Chronic prominent cardiomegaly. Chronic lung markings without segmental consolidation. Pulmonary vascular prominence most notable centrally stable in appearance. Calcified  tortuous aorta Electronically Signed By: Genia Del M.D. On: 04/18/2015 13:08   Nicolette Bang, DO 04/19/2015, 8:25 AM PGY-1, [facility] Intern pager: [phone], text pages welcome

## 2015-04-19 NOTE — Discharge Summary (Signed)
[facility] Discharge Summary  Patient name: Jodi [name] Medical record number: [id number] Date of birth: 1933/01/25 Age: 82 y.o. Gender: female Date of Admission: 04/18/2015  Date of Discharge: 04/19/2015 Admitting Physician: [name], MD  Primary Care Provider: [name], MD Consultants: None   Indication for Hospitalization: Weakness/Fatigue in the Setting of Influenza B+  Discharge Diagnoses/Problem List:  Patient Active Problem List   Diagnosis Date Noted  . Influenza 04/18/2015  . Flu syndrome   . Acute bronchitis due to other specified organisms 04/11/2015  . SOB (shortness of breath) 03/05/2015  . Chronic combined systolic and diastolic congestive heart failure ([place]) 03/04/2015  . Left renal mass   . Paroxysmal atrial fibrillation ([place]) 08/20/2013  . Renal cell carcinoma ([place]) 09/23/2012  . Chronic kidney disease (CKD), stage III (moderate) 02/20/2012  . Aortic stenosis, severe 12/06/2010  . SEBORRHEA 11/10/2007  . INSOMNIA 06/09/2007  . HYPERCHOLESTEROLEMIA 04/04/2006  . HYPERTENSION, BENIGN SYSTEMIC 04/04/2006  . CORONARY, ARTERIOSCLEROSIS 04/04/2006  . Acute combined systolic and diastolic heart failure ([place]) 04/04/2006  . OSTEOARTHRITIS, MULTI SITES 04/04/2006    Disposition: ALF ([facility])   Discharge Condition: Improved   Discharge Exam:  General: Elderly female sitting on side of bed, not as pale as at admission  Cardiovascular: RRR. 3/6 harsh systolic murmur present.  Respiratory: Minimally coarse breath sounds, improved from yesterday.Normal WOB.  Abdomen: soft, NTND  Extremities: No LE edema.   Brief Hospital Course:  Jodi [name] is 82 y.o. female with PMH significant for HTN, renal cell carcinoma, HLD, HFrEF, CAD s/p CABG, paroxysmal Afib on chronic anticoagulation and CKD who presented with generalized weakness in the setting of +Influenza B.   Generalized Weakness/Faituge:  Patient presented with complaint of weakness, although strength was objectively preserved on exam. Prior to admission, had been to the ED 2 other times within the past week. Had tested +Influenza B on 3/10 but was unaware of those results until she returned on 3/13. Patient was afebrile with a mild leukocytosis, which improved during hospital course. Patient initially with a lactic acidosis which improved with gentle IV hydration. Patient was started on Tamiflu despite symptoms >28 hours given risk factor of age and requirement for hospitalization. PT consult was placed and there were no recommendations for follow-up or equipment. Patient began to feel better and was stable for discharge home.   Cough: Patient reported almost 2 month history of cough. She reported DOE at baseline, which had not worsened with this new cough/viral illness. BNP was elevated to 210 (improved from 3 day prior when it was 443). She did not appear to be fluid overloaded on exam and CXR was not concerning for pulmonary edema. Suspected that she had a viral bronchitis. She was given breathing treatments as well as started on a 5 day course of Prednisone.   Issues for Follow Up:  1. Discharged with 3 day course of prednisone to complete a 5 day course ending on 3/17.  2. Discharged with Tamiflu to complete a 5 day course ending on the morning of 3/18.  3. At previous ED visit prior to hospitalization, she had been instructed to increase her Lasix to 40 mg daily for suspected CHF exacerbation. Patient did not appear to be having an acute exacerbation during this hospitalization, so she was discharged with instructions to resume her daily 20 mg of Lasix. Please follow up on her fluid status.   Significant Procedures: None   Significant Labs and Imaging:  Recent Labs Lab 04/15/15 1839 04/18/15 1200 04/19/15 0558  WBC 11.6* 13.2* 11.0*  HGB 14.3 14.4 13.1  HCT 41.6 42.1 38.8  PLT 229 246 220    Recent Labs Lab  04/15/15 1839 04/18/15 1200 04/19/15 0558  NA 139 138 139  K 3.8 4.3 4.5  CL 101 97* 100*  CO2 21* 25 24  GLUCOSE 185* 163* 190*  BUN 24* 36* 32*  CREATININE 1.32* 1.49* 1.38*  CALCIUM 9.8 10.4* 9.8  MG 2.0  --   --   ALKPHOS  --  81  --   AST  --  53*  --   ALT  --  39  --   ALBUMIN  --  4.3  --    iStat Troponin: 0.04 iStat Lactic Acid: 3.09  BNP 210.1   Dg Chest 2 View  04/18/2015 CLINICAL DATA: 82 year old female with increasing shortness breath. Hypertension. Nonsmoker. Subsequent encounter. EXAM: CHEST 2 VIEW COMPARISON: 04/15/2015, 05/19/2014 and 02/06/2013 FINDINGS: Post CABG. Prominent chronic cardiomegaly. Calcified tortuous aorta. Increased lung markings most notable lung bases greater on right similar to prior exam without segmental consolidation. Mild pulmonary vascular prominence most notable centrally and stable in appearance. Coronary artery calcifications. IMPRESSION: Chronic prominent cardiomegaly. Chronic lung markings without segmental consolidation. Pulmonary vascular prominence most notable centrally stable in appearance. Calcified tortuous aorta Electronically Signed By: Genia Del M.D. On: 04/18/2015 13:08    Results/Tests Pending at Time of Discharge: None   Discharge Medications:    Medication List    STOP taking these medications        HYDROcodone-homatropine 5-1.5 MG/5ML syrup  Commonly known as:  HYCODAN      TAKE these medications        albuterol 108 (90 Base) MCG/ACT inhaler  Commonly known as:  PROVENTIL HFA;VENTOLIN HFA  Inhale 2 puffs into the lungs every 6 (six) hours as needed for wheezing or shortness of breath.     amLODipine 10 MG tablet  Commonly known as:  NORVASC  Take 10 mg by mouth daily.     apixaban 5 MG Tabs tablet  Commonly known as:  ELIQUIS  Take 1 tablet (5 mg total) by mouth 2 (two) times daily.     benzonatate 100 MG capsule  Commonly known as:  TESSALON  Take 1 capsule (100 mg total) by mouth  every 8 (eight) hours.     beta carotene w/minerals tablet  Take 1 tablet by mouth daily.     CRANBERRY PO  Take 2 capsules by mouth daily with lunch.     dofetilide 125 MCG capsule  Commonly known as:  TIKOSYN  Take 1 capsule (125 mcg total) by mouth 2 (two) times daily.     furosemide 20 MG tablet  Commonly known as:  LASIX  Take 1 tablet (20 mg total) by mouth daily. Daily until your hospital follow up.     guaiFENesin 600 MG 12 hr tablet  Commonly known as:  MUCINEX  Take 1 tablet (600 mg total) by mouth 2 (two) times daily.     multivitamin with minerals Tabs tablet  Take 1 tablet by mouth daily.     naproxen sodium 220 MG tablet  Commonly known as:  ANAPROX  Take 440 mg by mouth daily as needed (for headache).     oseltamivir 30 MG capsule  Commonly known as:  TAMIFLU  Take 1 capsule (30 mg total) by mouth 2 (two) times daily.     predniSONE 20 MG tablet  Commonly known as:  DELTASONE  Take 2 tablets (40 mg total) by mouth daily with breakfast.     rosuvastatin 20 MG tablet  Commonly known as:  CRESTOR  TAKE 1 TABLET BY MOUTH DAILY     spironolactone 25 MG tablet  Commonly known as:  ALDACTONE  TAKE 1 TABLET BY MOUTH EVERY DAY.     SYSTANE OP  Place 1 drop into both eyes daily as needed (itching/ soreness in corner of eyes).     Vitamin D3 2000 units Tabs  Take 2,000 Units by mouth daily at 12 noon.     zolpidem 5 MG tablet  Commonly known as:  AMBIEN  Take 1 tablet (5 mg total) by mouth at bedtime as needed for sleep (Do not take more than 2-3 times per week).        Discharge Instructions: Please refer to Patient Instructions section of EMR for full details.  Patient was counseled important signs and symptoms that should prompt return to medical care, changes in medications, dietary instructions, activity restrictions, and follow up appointments.   Follow-Up Appointments:     Follow-up Information    Follow up with [facility]. Go on 04/22/2015.   Specialty:  Family Medicine   Why:  For Hospital Followup with Dr. Brett Albino at 2:00 pm    Contact information:   [address] [id number] [place] [id number] [place], DO 04/19/2015, 1:37 PM PGY-1, [facility]

## 2015-04-22 ENCOUNTER — Ambulatory Visit (INDEPENDENT_AMBULATORY_CARE_PROVIDER_SITE_OTHER): Payer: Medicare Other | Admitting: Internal Medicine

## 2015-04-22 ENCOUNTER — Encounter: Payer: Self-pay | Admitting: Internal Medicine

## 2015-04-22 VITALS — BP 138/60 | HR 71 | Temp 98.0°F | Ht 63.0 in | Wt 167.0 lb

## 2015-04-22 DIAGNOSIS — R059 Cough, unspecified: Secondary | ICD-10-CM

## 2015-04-22 DIAGNOSIS — J111 Influenza due to unidentified influenza virus with other respiratory manifestations: Secondary | ICD-10-CM

## 2015-04-22 DIAGNOSIS — R05 Cough: Secondary | ICD-10-CM

## 2015-04-22 MED ORDER — GUAIFENESIN ER 600 MG PO TB12: 1200.0000 mg | ORAL_TABLET | Freq: Two times a day (BID) | ORAL | Status: DC

## 2015-04-22 MED ORDER — BENZONATATE 100 MG PO CAPS: 200.0000 mg | ORAL_CAPSULE | Freq: Three times a day (TID) | ORAL | Status: DC

## 2015-04-22 NOTE — Assessment & Plan Note (Signed)
Pt feeling better overall, but still having residual cough. Discussed that she may continue to have cough for 1-2 weeks. Mild inspiratory and expiratory wheezing in all lung fields, but no crackles. Also with no fevers, so pneumonia less likely.  - Completed Prednisone course today - Continue Tamiflu for 3 more days to complete course - Will increase Tessalon dose from 100mg  tid to 200mg  tid - Will increase Mucinex dose from 600mg  bid to 1,200mg  bid - Advised Pt to return in 1 week if cough is not improved - Return precautions given - Follow-up with PCP as needed

## 2015-04-22 NOTE — Patient Instructions (Addendum)
It was very nice to meet you! I hope you start to feel better!  Please continue to take the Albuterol at prescribed.  I have increased the dose of your Tessalon to 2 capsules (200mg ) every 8 hours. I have also increased the dose of your Mucinex to 1,200mg  twice a day.  If you have any worsening shortness of breath that is not better with Albuterol, high fevers, or if you start feeling worse, please come back to see [place].   Over the course of the next week, you may still have a cough but it should continue to get better. If your cough is not better in 1 week, please come back to see [place]  - Dr. Brett Albino

## 2015-04-22 NOTE — Progress Notes (Signed)
[facility] Phone: [phone]  Subjective:  Hospital follow-up visit for the flu: Pt is here for a hospital follow-up visit for weakness secondary to influenza B. She continues to have a cough productive of yellow sputum. She has had some small streaks of blood in the sputum a couple days ago. She has been using the Albuterol inhaler. The cough is worse after she uses the inhaler. Her cough is better than when she was in the hospital. She has been taking the Tessalon, which hasn't helped. She took Hycodan starting a few weeks ago for bronchitis, but ran out of it before she was hospitalized. She has been taking Tamiflu, and has 3 doses left. She finished her course of Prednisone this morning. No fevers, no chills, no nausea, no vomiting, no shortness of breath, no congestion. Endorses mild rhinorrhea.   ROS: See HPI for pertinent positives and negatives Past Medical History- significant for HTN, CAD, severe aortic stenosis, pAF, CKD III Reviewed problem list.  Medications- reviewed and updated Current Outpatient Prescriptions  Medication Sig Dispense Refill  . albuterol (PROVENTIL HFA;VENTOLIN HFA) 108 (90 Base) MCG/ACT inhaler Inhale 2 puffs into the lungs every 6 (six) hours as needed for wheezing or shortness of breath. 1 Inhaler 0  . amLODipine (NORVASC) 10 MG tablet Take 10 mg by mouth daily.     Marland Kitchen apixaban (ELIQUIS) 5 MG TABS tablet Take 1 tablet (5 mg total) by mouth 2 (two) times daily. 60 tablet 1  . benzonatate (TESSALON) 100 MG capsule Take 1 capsule (100 mg total) by mouth every 8 (eight) hours. 21 capsule 0  . beta carotene w/minerals (OCUVITE) tablet Take 1 tablet by mouth daily.    . Cholecalciferol (VITAMIN D3) 2000 UNITS TABS Take 2,000 Units by mouth daily at 12 noon.     Marland Kitchen CRANBERRY PO Take 2 capsules by mouth daily with lunch.    . dofetilide (TIKOSYN) 125 MCG capsule Take 1 capsule (125 mcg total) by mouth 2 (two) times daily. 14 capsule 0  .  furosemide (LASIX) 20 MG tablet Take 1 tablet (20 mg total) by mouth daily. Daily until your hospital follow up. 30 tablet 0  . guaiFENesin (MUCINEX) 600 MG 12 hr tablet Take 1 tablet (600 mg total) by mouth 2 (two) times daily. (Patient taking differently: Take 600 mg by mouth 2 (two) times daily as needed for cough or to loosen phlegm. ) 60 tablet 1  . Multiple Vitamin (MULTIVITAMIN WITH MINERALS) TABS tablet Take 1 tablet by mouth daily.    . naproxen sodium (ANAPROX) 220 MG tablet Take 440 mg by mouth daily as needed (for headache).    . oseltamivir (TAMIFLU) 30 MG capsule Take 1 capsule (30 mg total) by mouth 2 (two) times daily. 8 capsule 0  . Polyethyl Glycol-Propyl Glycol (SYSTANE OP) Place 1 drop into both eyes daily as needed (itching/ soreness in corner of eyes).    . predniSONE (DELTASONE) 20 MG tablet Take 2 tablets (40 mg total) by mouth daily with breakfast. 6 tablet 0  . rosuvastatin (CRESTOR) 20 MG tablet TAKE 1 TABLET BY MOUTH DAILY (Patient taking differently: TAKE 1 TABLET BY MOUTH every evening) 90 tablet 2  . spironolactone (ALDACTONE) 25 MG tablet TAKE 1 TABLET BY MOUTH EVERY DAY. 90 tablet 1  . zolpidem (AMBIEN) 5 MG tablet Take 1 tablet (5 mg total) by mouth at bedtime as needed for sleep (Do not take more than 2-3 times per week). 25 tablet 3  No current facility-administered medications for this visit.   Chief complaint-noted Family history reviewed for today's visit. No changes. Social history- patient is a never smoker.  Objective: BP 138/60 mmHg  Pulse 71  Temp(Src) 98 F (36.7 C) (Oral)  Ht 5\' 3"  (1.6 m)  Wt 167 lb (75.751 kg)  BMI 29.59 kg/m2  SpO2 96% Gen: NAD, alert, cooperative with exam, coughing intermittently throughout exam HEENT: NCAT, EOMI, MMM, oropharynx normal, TMs clear Neck: FROM, supple, no cervical lymphadenopathy CV: RRR, no murmur Resp: Mild inspiratory and expiratory wheezing in all lung fields, normal work of breathing, no  crackles Msk: No edema, warm, normal tone, moves UE/LE spontaneously Neuro: Alert and oriented, no gross deficits Skin: No rashes, no lesions Psych: Appropriate behavior  Assessment/Plan: Hospital follow-up for influenza: Pt feeling better overall, but still having residual cough. Discussed that she may continue to have cough for 1-2 weeks. Mild inspiratory and expiratory wheezing in all lung fields, but no crackles. Also with no fevers, so pneumonia less likely.  - Completed Prednisone course today - Continue Tamiflu for 3 more days to complete course - Will increase Tessalon dose from 100mg  tid to 200mg  tid - Will increase Mucinex dose from 600mg  bid to 1,200mg  bid - Advised Pt to return in 1 week if cough is not improved - Return precautions given - Follow-up with PCP as needed   Hyman Bible, MD PGY-1

## 2015-04-26 DIAGNOSIS — H353221 Exudative age-related macular degeneration, left eye, with active choroidal neovascularization: Secondary | ICD-10-CM | POA: Diagnosis not present

## 2015-05-02 ENCOUNTER — Encounter: Payer: Self-pay | Admitting: Family Medicine

## 2015-05-02 ENCOUNTER — Ambulatory Visit (INDEPENDENT_AMBULATORY_CARE_PROVIDER_SITE_OTHER): Payer: Medicare Other | Admitting: Family Medicine

## 2015-05-02 VITALS — BP 155/68 | HR 75 | Temp 97.9°F | Wt 156.0 lb

## 2015-05-02 DIAGNOSIS — H60399 Other infective otitis externa, unspecified ear: Secondary | ICD-10-CM

## 2015-05-02 MED ORDER — AMOXICILLIN-POT CLAVULANATE 875-125 MG PO TABS: 1.0000 | ORAL_TABLET | Freq: Two times a day (BID) | ORAL | Status: DC

## 2015-05-02 MED ORDER — CIPROFLOXACIN-DEXAMETHASONE 0.3-0.1 % OT SUSP: 4.0000 [drp] | Freq: Two times a day (BID) | OTIC | Status: DC

## 2015-05-02 NOTE — Progress Notes (Signed)
Date of Visit: 05/02/2015   HPI:  Patient presents for a same day appointment to discuss ear issues.  Has had pus draining from left ear for 1 week. R ear started having watery drainage on Saturday. No pain in ears. Notes chronic tympanic membrane perforation bilaterally. Had surgery on her ears a long time ago. No fevers. Is going out of town soon for a month and wants to get this treated now so she doesn't have to deal with it while out of state.   ROS: See HPI  [place]: history of chronic systolic & diastolic heart failure, severe aortic stenosis, CKD stage 3, CAD, hyperlipidemia, hypertension, osteoarthritis, paroxysmal atrial fibrillation, left renal cell carcinoma  PHYSICAL EXAM: BP 155/68 mmHg  Pulse 75  Temp(Src) 97.9 F (36.6 C) (Oral)  Wt 156 lb (70.761 kg) Gen: NAD, pleasant, cooperative HEENT: normocephalic, atraumatic. Bilateral ears with some canal irritation. Tympanic membranes abnormal appearing bilaterally - landmarks difficult to fully visualize, some mild erythema, no obvious bulging Heart: regular rate and rhythm, no murmur Lungs: clear to auscultation bilaterally normal work of breathing  Neuro: alert grossly nonfocal, speech normal  ASSESSMENT/PLAN:  1. Otitis externa - with drainage of ears described as purulent concern for otitis externa. Patient reports long history of ear issues and describes usually needing oral antibiotics for this as well as topical ear drops. Well appearing overall. Plan: - ciprodex drops 4 drops each ear twice daily for 7 days - rx given for aumgentin (given chronic tympanic membrane perforations), instructed to fill but not to take unless symptoms not resolved with ciprodex drops - patient agreeable to this plan  FOLLOW UP: Follow up as needed if symptoms worsen or fail to improve.    [place]. Ardelia Mems, [place]

## 2015-05-02 NOTE — Patient Instructions (Signed)
Sent in ear drops and oral antibiotics for you Wait to start the oral antibiotics if the ear drops don't work in the next couple of days  Follow up if not getting better after trying both, sooner if worsening  Be well, Dr. Ardelia Mems

## 2015-06-08 DIAGNOSIS — H353221 Exudative age-related macular degeneration, left eye, with active choroidal neovascularization: Secondary | ICD-10-CM | POA: Diagnosis not present

## 2015-06-29 DIAGNOSIS — I480 Paroxysmal atrial fibrillation: Secondary | ICD-10-CM | POA: Diagnosis not present

## 2015-07-20 DIAGNOSIS — H353221 Exudative age-related macular degeneration, left eye, with active choroidal neovascularization: Secondary | ICD-10-CM | POA: Diagnosis not present

## 2015-08-22 DIAGNOSIS — H40013 Open angle with borderline findings, low risk, bilateral: Secondary | ICD-10-CM | POA: Diagnosis not present

## 2015-08-22 DIAGNOSIS — Z961 Presence of intraocular lens: Secondary | ICD-10-CM | POA: Diagnosis not present

## 2015-08-22 DIAGNOSIS — H353114 Nonexudative age-related macular degeneration, right eye, advanced atrophic with subfoveal involvement: Secondary | ICD-10-CM | POA: Diagnosis not present

## 2015-08-22 DIAGNOSIS — H353221 Exudative age-related macular degeneration, left eye, with active choroidal neovascularization: Secondary | ICD-10-CM | POA: Diagnosis not present

## 2015-08-31 DIAGNOSIS — H353221 Exudative age-related macular degeneration, left eye, with active choroidal neovascularization: Secondary | ICD-10-CM | POA: Diagnosis not present

## 2015-10-19 DIAGNOSIS — H353221 Exudative age-related macular degeneration, left eye, with active choroidal neovascularization: Secondary | ICD-10-CM | POA: Diagnosis not present

## 2015-10-24 ENCOUNTER — Other Ambulatory Visit: Payer: Self-pay | Admitting: *Deleted

## 2015-10-24 DIAGNOSIS — Z5181 Encounter for therapeutic drug level monitoring: Secondary | ICD-10-CM | POA: Diagnosis not present

## 2015-10-28 NOTE — Telephone Encounter (Signed)
2nd request.  Deva Ron L, RN  

## 2015-10-30 MED ORDER — ZOLPIDEM TARTRATE 5 MG PO TABS: 5.0000 mg | ORAL_TABLET | Freq: Every evening | ORAL | 0 refills | Status: DC | PRN

## 2015-10-30 NOTE — Telephone Encounter (Signed)
Pls call in the [place] let her know she needs to come in for an appointment before I can refill  Thanks  [place]

## 2015-10-31 DIAGNOSIS — R739 Hyperglycemia, unspecified: Secondary | ICD-10-CM | POA: Diagnosis not present

## 2015-10-31 DIAGNOSIS — I480 Paroxysmal atrial fibrillation: Secondary | ICD-10-CM | POA: Diagnosis not present

## 2015-10-31 DIAGNOSIS — I10 Essential (primary) hypertension: Secondary | ICD-10-CM | POA: Diagnosis not present

## 2015-10-31 DIAGNOSIS — Z5181 Encounter for therapeutic drug level monitoring: Secondary | ICD-10-CM | POA: Diagnosis not present

## 2015-10-31 NOTE — Telephone Encounter (Signed)
Medication called into pharmacy.  Tried contacting patient on both numbers listed with no answer or VM available.  Will try again later today or will send a letter if can't be reached. Loeta Herst,CMA

## 2015-11-09 ENCOUNTER — Encounter: Payer: Self-pay | Admitting: Family Medicine

## 2015-11-09 ENCOUNTER — Ambulatory Visit (INDEPENDENT_AMBULATORY_CARE_PROVIDER_SITE_OTHER): Payer: Medicare Other | Admitting: Family Medicine

## 2015-11-09 DIAGNOSIS — Z23 Encounter for immunization: Secondary | ICD-10-CM

## 2015-11-09 DIAGNOSIS — R739 Hyperglycemia, unspecified: Secondary | ICD-10-CM | POA: Insufficient documentation

## 2015-11-09 DIAGNOSIS — F5101 Primary insomnia: Secondary | ICD-10-CM

## 2015-11-09 DIAGNOSIS — E7800 Pure hypercholesterolemia, unspecified: Secondary | ICD-10-CM | POA: Diagnosis not present

## 2015-11-09 DIAGNOSIS — M159 Polyosteoarthritis, unspecified: Secondary | ICD-10-CM

## 2015-11-09 LAB — LIPID PANEL
Cholesterol: 176 mg/dL (ref 125–200)
HDL: 53 mg/dL (ref >=46)
LDL Cholesterol: 89 mg/dL (ref <130)
Total CHOL/HDL Ratio: 3.3 Ratio (ref <=5.0)
Triglycerides: 169 mg/dL — ABNORMAL HIGH (ref <150)
VLDL: 34 mg/dL — ABNORMAL HIGH (ref <30)

## 2015-11-09 LAB — POCT GLYCOSYLATED HEMOGLOBIN (HGB A1C): Hemoglobin A1C: 5.9

## 2015-11-09 NOTE — Assessment & Plan Note (Signed)
New No symptoms of diabetes Will check a1c

## 2015-11-09 NOTE — Patient Instructions (Signed)
Good to see you today!  Thanks for coming in.  I will send a letter or call depending on your blood tests  Have a great trip to [facility]

## 2015-11-09 NOTE — Progress Notes (Signed)
Subjective  Patient is presenting with the following illnesses  Hyperglycemia - had a blood sugar of 190 in recent blood work from Dr Einar Gip.  No polydipsia or uria.   No lightheadness or skin infection  HYPERLIPIDEMIA Symptoms Chest pain on exertion:  no   Leg claudication:   no Medications (modifying factor): Compliance- not on medication Right upper quadrant pain- no  Muscle aches- no Duration - years   Timing - continuous    Component Value Date/Time   CHOL 181 11/10/2012 1345   TRIG 247 (H) 11/10/2012 1345   HDL 50 11/10/2012 1345   LDLDIRECT 79 10/22/2011 1448   VLDL 49 (H) 11/10/2012 1345   CHOLHDL 3.6 11/10/2012 1345   DJD Her joints are doing well.  Not taking any pain medications except rarely naprosyn.  No joint swelling or redness  Chief Complaint noted Review of Symptoms - see HPI PMH - Smoking status noted.     Objective Vital Signs reviewed Alert nad Lungs:  Normal respiratory effort, chest expands symmetrically. Lungs are clear to auscultation, no crackles or wheezes. Extremities:  No cyanosis, edema, or deformity noted with good range of motion of all major joints.       Assessments/Plans  No problem-specific Assessment & Plan notes found for this encounter.   See Encounter view if individual problem A/Ps not visible See after visit summary for details of patient instuctions

## 2015-11-09 NOTE — Assessment & Plan Note (Signed)
Seems stable check labs

## 2015-11-09 NOTE — Assessment & Plan Note (Signed)
Stable

## 2015-11-09 NOTE — Assessment & Plan Note (Signed)
Uses ambien sparingly but really improves her QOL when insomnia is bad

## 2015-11-14 ENCOUNTER — Encounter: Payer: Self-pay | Admitting: Family Medicine

## 2015-11-28 ENCOUNTER — Telehealth: Payer: Self-pay | Admitting: Family Medicine

## 2015-11-28 MED ORDER — CIPROFLOXACIN-HYDROCORTISONE 0.2-1 % OT SUSP: 3.0000 [drp] | Freq: Two times a day (BID) | OTIC | 0 refills | Status: DC

## 2015-11-28 NOTE — Telephone Encounter (Signed)
Pls let her know I sent in ear drops  If not better in 3-5 days or if any worse needs to come in  Thanks  [facility]

## 2015-11-28 NOTE — Telephone Encounter (Signed)
Would like dr Erin Hearing to call her in some ear drops. She has an ear infection and she has been "mopping" it out.  [place]

## 2015-11-29 NOTE — Telephone Encounter (Signed)
Pts daughter informed and voiced understanding.

## 2015-12-12 ENCOUNTER — Other Ambulatory Visit: Payer: Self-pay | Admitting: Family Medicine

## 2015-12-13 DIAGNOSIS — H353221 Exudative age-related macular degeneration, left eye, with active choroidal neovascularization: Secondary | ICD-10-CM | POA: Diagnosis not present

## 2015-12-13 DIAGNOSIS — H35373 Puckering of macula, bilateral: Secondary | ICD-10-CM | POA: Diagnosis not present

## 2015-12-13 DIAGNOSIS — H353134 Nonexudative age-related macular degeneration, bilateral, advanced atrophic with subfoveal involvement: Secondary | ICD-10-CM | POA: Diagnosis not present

## 2016-01-10 ENCOUNTER — Other Ambulatory Visit: Payer: Self-pay | Admitting: *Deleted

## 2016-01-10 MED ORDER — ZOLPIDEM TARTRATE 5 MG PO TABS: 5.0000 mg | ORAL_TABLET | Freq: Every evening | ORAL | 1 refills | Status: DC | PRN

## 2016-01-10 NOTE — Telephone Encounter (Signed)
Pls call in the Ambien  Thanks  [place]

## 2016-01-10 NOTE — Telephone Encounter (Signed)
Ambien 5 mg tab: take 1 tablet PO PRN for sleep; don't take no more than 2-3 weeks; #25, 1 refill per Dr. Erin Hearing.  Derl Barrow, RN

## 2016-01-10 NOTE — Telephone Encounter (Signed)
Pt has sores on head again and would like a refill on the cream Dr. Erin Hearing gave her last time for it. Pt uses [employer]. Please advise. Thanks! ep

## 2016-01-11 MED ORDER — TRIAMCINOLONE ACETONIDE 0.1 % EX CREA: 1.0000 "application " | TOPICAL_CREAM | Freq: Two times a day (BID) | CUTANEOUS | 0 refills | Status: AC

## 2016-02-07 DIAGNOSIS — H906 Mixed conductive and sensorineural hearing loss, bilateral: Secondary | ICD-10-CM | POA: Diagnosis not present

## 2016-02-08 DIAGNOSIS — H353221 Exudative age-related macular degeneration, left eye, with active choroidal neovascularization: Secondary | ICD-10-CM | POA: Diagnosis not present

## 2016-02-08 IMAGING — CR DG CHEST 1V PORT
1 series · 1 of 1 positions shown · non-contrast
Comparison: 08/20/2013

CLINICAL DATA: Near syncope

EXAM:
PORTABLE CHEST - 1 VIEW

[AP]
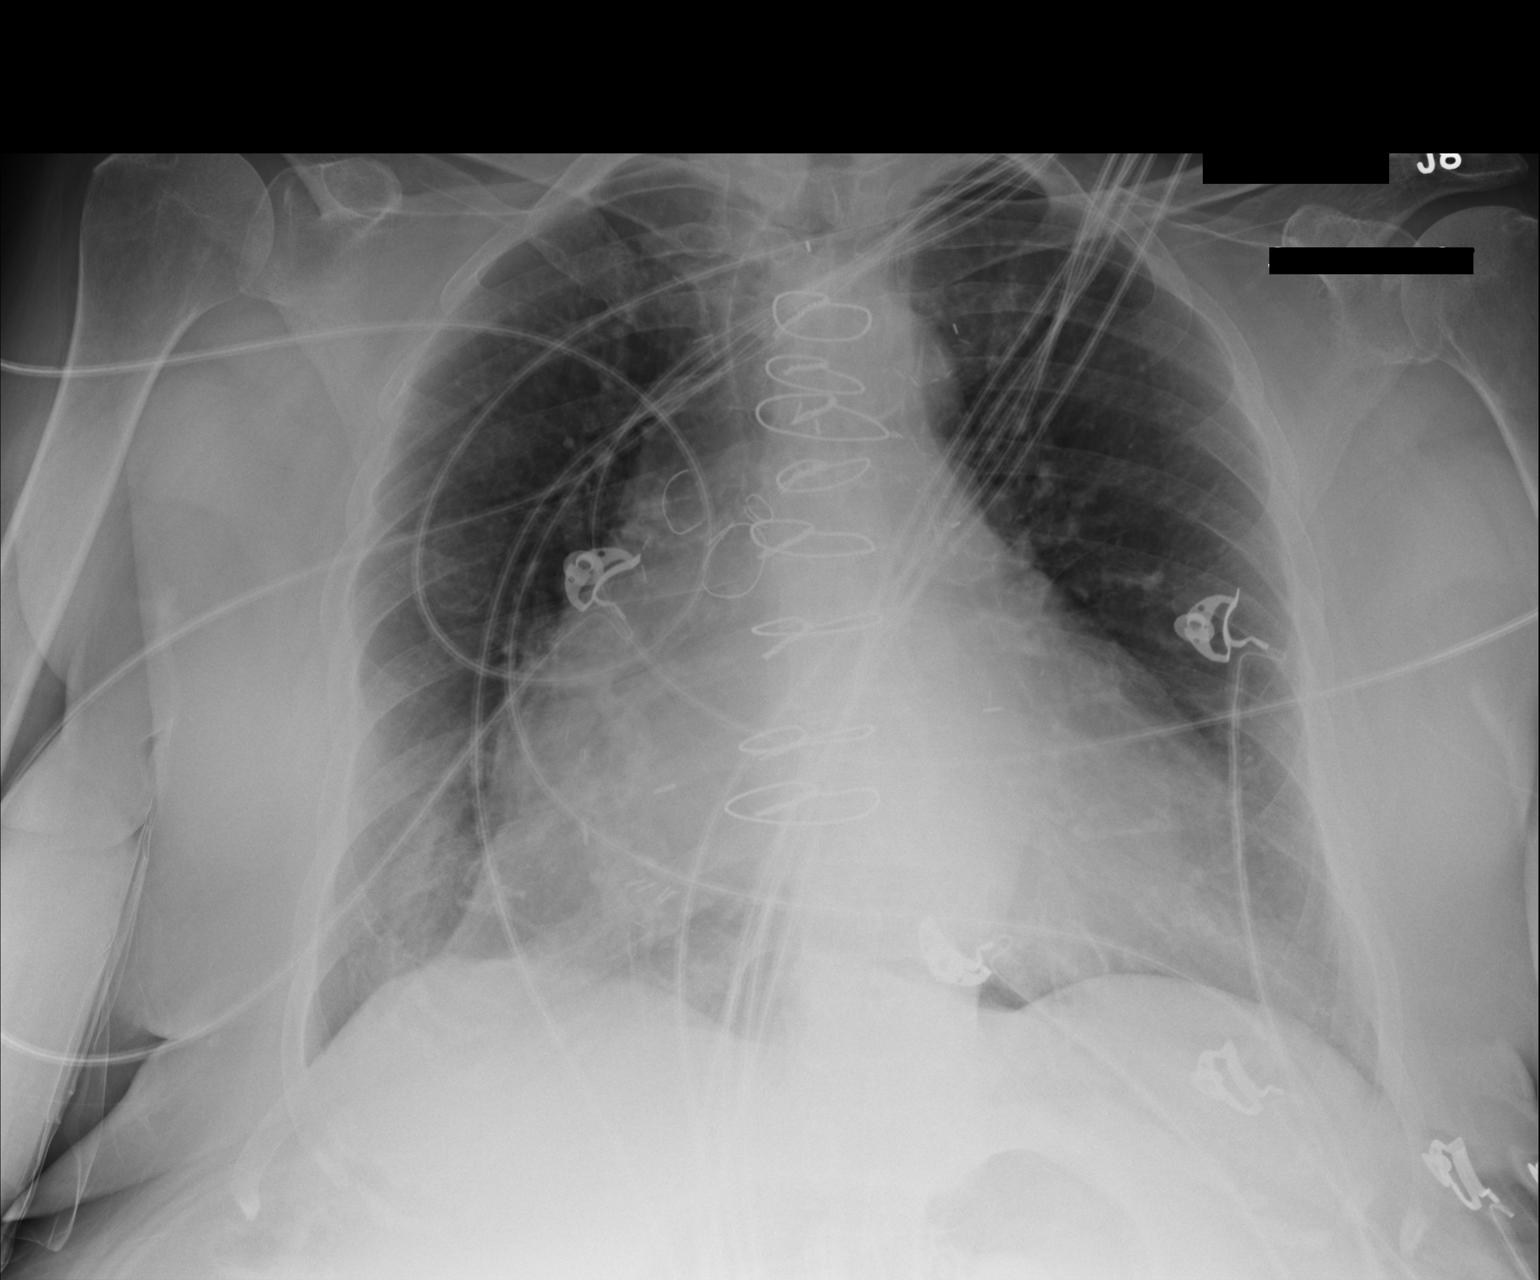

[1 of 1 positions shown; findings below may reference images not displayed]

FINDINGS: Chronic cardiomegaly. Status post CABG with stenting, likely of a
venous graft. Negative upper mediastinal contours. There is no
edema, consolidation, effusion, or pneumothorax.
IMPRESSION: No active disease.

## 2016-03-19 ENCOUNTER — Other Ambulatory Visit: Payer: Self-pay | Admitting: Family Medicine

## 2016-03-29 DIAGNOSIS — H353221 Exudative age-related macular degeneration, left eye, with active choroidal neovascularization: Secondary | ICD-10-CM | POA: Diagnosis not present

## 2016-03-29 DIAGNOSIS — H35372 Puckering of macula, left eye: Secondary | ICD-10-CM | POA: Diagnosis not present

## 2016-03-29 DIAGNOSIS — H353134 Nonexudative age-related macular degeneration, bilateral, advanced atrophic with subfoveal involvement: Secondary | ICD-10-CM | POA: Diagnosis not present

## 2016-03-29 DIAGNOSIS — H35363 Drusen (degenerative) of macula, bilateral: Secondary | ICD-10-CM | POA: Diagnosis not present

## 2016-04-09 ENCOUNTER — Other Ambulatory Visit: Payer: Self-pay | Admitting: Family Medicine

## 2016-04-12 DIAGNOSIS — H353221 Exudative age-related macular degeneration, left eye, with active choroidal neovascularization: Secondary | ICD-10-CM | POA: Diagnosis not present

## 2016-04-12 DIAGNOSIS — H35372 Puckering of macula, left eye: Secondary | ICD-10-CM | POA: Diagnosis not present

## 2016-04-12 DIAGNOSIS — H353134 Nonexudative age-related macular degeneration, bilateral, advanced atrophic with subfoveal involvement: Secondary | ICD-10-CM | POA: Diagnosis not present

## 2016-04-20 DIAGNOSIS — I10 Essential (primary) hypertension: Secondary | ICD-10-CM | POA: Diagnosis not present

## 2016-04-20 DIAGNOSIS — Z5181 Encounter for therapeutic drug level monitoring: Secondary | ICD-10-CM | POA: Diagnosis not present

## 2016-04-20 DIAGNOSIS — R739 Hyperglycemia, unspecified: Secondary | ICD-10-CM | POA: Diagnosis not present

## 2016-04-20 DIAGNOSIS — I480 Paroxysmal atrial fibrillation: Secondary | ICD-10-CM | POA: Diagnosis not present

## 2016-05-23 ENCOUNTER — Other Ambulatory Visit (HOSPITAL_COMMUNITY): Payer: Self-pay | Admitting: Interventional Radiology

## 2016-05-23 DIAGNOSIS — N2889 Other specified disorders of kidney and ureter: Secondary | ICD-10-CM

## 2016-06-04 ENCOUNTER — Other Ambulatory Visit: Payer: Self-pay | Admitting: *Deleted

## 2016-06-04 DIAGNOSIS — N289 Disorder of kidney and ureter, unspecified: Secondary | ICD-10-CM

## 2016-06-14 ENCOUNTER — Other Ambulatory Visit: Payer: Self-pay | Admitting: *Deleted

## 2016-06-14 ENCOUNTER — Other Ambulatory Visit: Payer: Self-pay | Admitting: Family Medicine

## 2016-06-15 NOTE — Telephone Encounter (Signed)
Please let he know she needs an office visit (has been more than a year) before I can refill her [name]  Thanks  [place]

## 2016-06-18 NOTE — Telephone Encounter (Signed)
Left message on voicemail for patient to call back and scheduled an appointment.

## 2016-06-19 DIAGNOSIS — N289 Disorder of kidney and ureter, unspecified: Secondary | ICD-10-CM | POA: Diagnosis not present

## 2016-06-20 LAB — CREATININE WITH EST GFR
Creat: 1.37 mg/dL — ABNORMAL HIGH (ref 0.60–0.88)
GFR, Est African American: 42 mL/min — ABNORMAL LOW (ref >=60)
GFR, Est Non African American: 36 mL/min — ABNORMAL LOW (ref >=60)

## 2016-06-20 LAB — BUN: BUN: 23 mg/dL (ref 7–25)

## 2016-06-21 DIAGNOSIS — H35372 Puckering of macula, left eye: Secondary | ICD-10-CM | POA: Diagnosis not present

## 2016-06-21 DIAGNOSIS — H43812 Vitreous degeneration, left eye: Secondary | ICD-10-CM | POA: Diagnosis not present

## 2016-06-21 DIAGNOSIS — H353222 Exudative age-related macular degeneration, left eye, with inactive choroidal neovascularization: Secondary | ICD-10-CM | POA: Diagnosis not present

## 2016-06-21 DIAGNOSIS — H353134 Nonexudative age-related macular degeneration, bilateral, advanced atrophic with subfoveal involvement: Secondary | ICD-10-CM | POA: Diagnosis not present

## 2016-06-27 ENCOUNTER — Encounter: Payer: Self-pay | Admitting: Family Medicine

## 2016-06-27 ENCOUNTER — Ambulatory Visit (INDEPENDENT_AMBULATORY_CARE_PROVIDER_SITE_OTHER): Payer: Medicare Other | Admitting: Family Medicine

## 2016-06-27 ENCOUNTER — Other Ambulatory Visit: Payer: Self-pay | Admitting: Family Medicine

## 2016-06-27 DIAGNOSIS — F5101 Primary insomnia: Secondary | ICD-10-CM | POA: Diagnosis not present

## 2016-06-27 DIAGNOSIS — R739 Hyperglycemia, unspecified: Secondary | ICD-10-CM

## 2016-06-27 DIAGNOSIS — N183 Chronic kidney disease, stage 3 unspecified: Secondary | ICD-10-CM

## 2016-06-27 DIAGNOSIS — C649 Malignant neoplasm of unspecified kidney, except renal pelvis: Secondary | ICD-10-CM | POA: Diagnosis not present

## 2016-06-27 DIAGNOSIS — I10 Essential (primary) hypertension: Secondary | ICD-10-CM | POA: Diagnosis not present

## 2016-06-27 MED ORDER — ZOLPIDEM TARTRATE 5 MG PO TABS: 5.0000 mg | ORAL_TABLET | Freq: Every evening | ORAL | 1 refills | Status: DC | PRN

## 2016-06-27 NOTE — Assessment & Plan Note (Signed)
For renal MRI end of this month

## 2016-06-27 NOTE — Assessment & Plan Note (Signed)
Stable.  Using [place] appropriately.  Understands risks

## 2016-06-27 NOTE — Assessment & Plan Note (Signed)
BP Readings from Last 3 Encounters:  06/27/16 (!) 162/62  11/09/15 (!) 108/56  05/02/15 (!) 155/68   Elevated systolic today but low diastolic.  Could not tolerate hydralazine.  Will defer any further treatment to cardiology

## 2016-06-27 NOTE — Patient Instructions (Addendum)
Good to see you today!  Thanks for coming in.  Good luck with the MRI  Keep taking the medications as you are  If you need more ear drops ask your pharmacy to call me  Be careful with the Ambien as it will increase your risk of falls  Come back in 6-12 months

## 2016-06-27 NOTE — Progress Notes (Signed)
Subjective  Patient is presenting with the following illnesses  HYPERTENSION Disease Monitoring  Home BP Monitoring (Severity) takes soemtimes last 130/52 Symptoms - Chest pain- no    Dyspnea- none new Medications (Modifying factors) Compliance-  Daily Could not tolerate hydralazine from Dr Jenkins [place]. Lightheadedness-  no  Edema- no Timing - continuous  Duration - years ROS - See HPI  INSOMNIA Frequently has trouble sleeping.  No shortness of breath or severe snoring.  Takes ambien 2- 3 times a week.  No falls or confusion  KIDNEY DISEASE No leg swelling if takes lasix.  Avoiding NSAIDS.  No skin itching  HYPERGLYCEMIA No polyuria or dipsia. Weight is stable. No skin problems  PMH Lab Review   Potassium  Date Value Ref Range Status  04/19/2015 4.5 3.5 - 5.1 mmol/L Final   Sodium  Date Value Ref Range Status  04/19/2015 139 135 - 145 mmol/L Final   Creat  Date Value Ref Range Status  06/19/2016 1.37 (H) 0.60 - 0.88 mg/dL Final    Comment:      For patients > or = 81 years of age: The upper reference limit for Creatinine is approximately 13% higher for people identified as African-American.             Chief Complaint noted Review of Symptoms - see HPI PMH - Smoking status noted.     Objective Vital Signs reviewed Alert nad Able to get up and down from exam table without problems H - RRR with Gr 3 m Lungs:  Normal respiratory effort, chest expands symmetrically. Lungs are clear to auscultation, no crackles or wheezes. Extremities:  No cyanosis, edema, or deformity noted with good range of motion of all major joints.       Assessments/Plans  No problem-specific Assessment & Plan notes found for this encounter.   See Encounter view if individual problem A/Ps not visible See after visit summary for details of patient instuctions

## 2016-06-27 NOTE — Assessment & Plan Note (Signed)
Stable.   For renal MRI end of this month

## 2016-06-27 NOTE — Assessment & Plan Note (Signed)
Stable She is watching her weight

## 2016-07-03 ENCOUNTER — Ambulatory Visit (HOSPITAL_COMMUNITY)
Admission: RE | Admit: 2016-07-03 | Discharge: 2016-07-03 | Disposition: A | Discharge status: Home / Self Care | Payer: Medicare Other | Source: Ambulatory Visit | Attending: Interventional Radiology | Admitting: Interventional Radiology

## 2016-07-03 ENCOUNTER — Ambulatory Visit
Admission: RE | Admit: 2016-07-03 | Discharge: 2016-07-03 | Disposition: A | Discharge status: Home / Self Care | Payer: Medicare Other | Source: Ambulatory Visit | Attending: Interventional Radiology | Admitting: Interventional Radiology

## 2016-07-03 DIAGNOSIS — N281 Cyst of kidney, acquired: Secondary | ICD-10-CM | POA: Insufficient documentation

## 2016-07-03 DIAGNOSIS — I517 Cardiomegaly: Secondary | ICD-10-CM | POA: Insufficient documentation

## 2016-07-03 DIAGNOSIS — K8020 Calculus of gallbladder without cholecystitis without obstruction: Secondary | ICD-10-CM | POA: Insufficient documentation

## 2016-07-03 DIAGNOSIS — D1803 Hemangioma of intra-abdominal structures: Secondary | ICD-10-CM | POA: Insufficient documentation

## 2016-07-03 DIAGNOSIS — N2889 Other specified disorders of kidney and ureter: Secondary | ICD-10-CM

## 2016-07-03 DIAGNOSIS — I700 Atherosclerosis of aorta: Secondary | ICD-10-CM | POA: Diagnosis not present

## 2016-07-03 HISTORY — PX: IR RADIOLOGIST EVAL & MGMT: IMG5224

## 2016-07-03 MED ORDER — GADOBENATE DIMEGLUMINE 529 MG/ML IV SOLN
15.0000 mL | Freq: Once | INTRAVENOUS | Status: AC | PRN
  Administered 2016-07-03: 15 mL via INTRAVENOUS

## 2016-07-03 NOTE — Progress Notes (Signed)
Chief Complaint: Follow-up of cystic left renal mass.  History of Present Illness: Jodi [name] is a 83 y.o. female initially referred in 2013 by Dr. [name] for assessment and consideration of ablation of a cystic and partially enhancing lesion of the upper left kidney. This has remained stable over time demonstrating no evidence of growth. A known angiomyolipoma of the upper right kidney has also been stable and not demonstrated growth. Underlying chronic kidney disease is present with most recent GFR of 36 mL per minute.  Past Medical History:  Diagnosis Date  . Aortic stenosis   . Arthritis    "maybe in my fingers and toes" (09/28/2014)  . Bradycardia   . CHF (congestive heart failure) ([place])   . Chronic renal insufficiency, stage III (moderate)    [name] 09/27/2014  . Coronary artery disease   . Heart murmur   . Hyperlipidemia   . Hypertension   . Macular degeneration, left eye   . Myocardial infarction [facility]) 1995; 2003  . Paroxysmal atrial fibrillation (HCC)   . Renal cell carcinoma ([place]) 09/23/2012  . Renal mass 12/06/2010   CT Abdomen 11-27-10 upper pole region of the right kidney which is suspicious for solid lesion, measuring 1.9 x 1.3 cm. This lesion is concerning for renal cell carcinoma given the solid appearance.  Following with Dr [name].  Renal bx was benign    . Shortness of breath   . Splenic infarct 12/06/2010  . Stroke [facility]) 2003   "when I had heart surgery"; denies residual on 09/28/2014  . TIA (transient ischemic attack) "several"    Past Surgical History:  Procedure Laterality Date  . CARDIAC CATHETERIZATION    . CARDIOVERSION N/A 10/20/2013   Procedure: CARDIOVERSION;  Surgeon: Laverda Page, MD;  Location: [facility];  Service: Cardiovascular;  Laterality: N/A;  H&P in file  . CARDIOVERSION N/A 05/18/2014   Procedure: CARDIOVERSION;  Surgeon: [name], MD;  Location: [facility];  Service: Cardiovascular;  Laterality: N/A;  . CATARACT  EXTRACTION W/ INTRAOCULAR LENS  IMPLANT, BILATERAL Bilateral ~ 2013  . CORONARY ANGIOPLASTY    . CORONARY ARTERY BYPASS GRAFT  1995 and 2003  . LEFT AND RIGHT HEART CATHETERIZATION WITH CORONARY ANGIOGRAM N/A 11/24/2013   Procedure: LEFT AND RIGHT HEART CATHETERIZATION WITH CORONARY ANGIOGRAM;  Surgeon: Laverda Page, MD;  Location: [facility] CATH LAB;  Service: Cardiovascular;  Laterality: N/A;  . PERCUTANEOUS NEEDLE BIOPSY OF RENAL LESION  ?2014  . TOTAL ABDOMINAL HYSTERECTOMY  1990's   "both ovaries were full of little tiny sores"  . TYMPANOPLASTY Bilateral    "had holes in them; still have holes in them"    Allergies: Sulfamethoxazole-trimethoprim and Tape  Medications: Prior to Admission medications   Medication Sig Start Date End Date Taking? Authorizing Provider  albuterol (PROVENTIL HFA;VENTOLIN HFA) 108 (90 Base) MCG/ACT inhaler Inhale 2 puffs into the lungs every 6 (six) hours as needed for wheezing or shortness of breath. 04/11/15   Patrecia Pour, MD  amLODipine (NORVASC) 10 MG tablet Take 10 mg by mouth daily.  03/14/15   [provider]  apixaban (ELIQUIS) 5 MG TABS tablet Take 1 tablet (5 mg total) by mouth 2 (two) times daily. 08/21/13   [name], MD  beta carotene w/minerals (OCUVITE) tablet Take 1 tablet by mouth daily.    [provider]  Cholecalciferol (VITAMIN D3) 2000 UNITS TABS Take 2,000 Units by mouth daily at 12 noon.     [provider]  CIPRO HC otic suspension PLACE 3 DROPS INTO THE LEFT EAR 2 TIMES DAILY. 06/28/16   [name], MD  CRANBERRY PO Take 2 capsules by mouth daily with lunch.    [provider]  dofetilide (TIKOSYN) 125 MCG capsule Take 1 capsule (125 mcg total) by mouth 2 (two) times daily. 09/30/14   [name], MD  furosemide (LASIX) 20 MG tablet Take 1 tablet (20 mg total) by mouth daily. Daily until your hospital follow up. 03/05/15   Rosemarie Ax, MD  Multiple Vitamin (MULTIVITAMIN WITH MINERALS) TABS  tablet Take 1 tablet by mouth daily.    [provider]  naproxen sodium (ANAPROX) 220 MG tablet Take 440 mg by mouth daily as needed (for headache).    [provider]  rosuvastatin (CRESTOR) 20 MG tablet TAKE 1 TABLET BY MOUTH DAILY 04/10/16   [name], MD  spironolactone (ALDACTONE) 25 MG tablet TAKE 1 TABLET BY MOUTH EVERY DAY. 06/14/16   [name], MD  triamcinolone cream (KENALOG) 0.1 % Apply 1 application topically 2 (two) times daily. Do not use more than 2 weeks in a row 01/11/16 07/05/16  Chambliss, Jeb Levering, MD  zolpidem (AMBIEN) 5 MG tablet Take 1 tablet (5 mg total) by mouth at bedtime as needed for sleep (Do not take more than 2-3 times per week). 06/27/16   [name], MD     Family History  Problem Relation Age of Onset  . Coronary artery disease Mother   . Coronary artery disease Brother   . Stroke Mother   . Heart attack Brother   . Heart attack Son   . Heart attack Daughter   . Heart attack Sister     Social History   Social History  . Marital status: Widowed    Spouse name: N/A  . Number of children: 8  . Years of education: 10   Occupational History  . retired-mill worker    Social History Main Topics  . Smoking status: Never Smoker  . Smokeless tobacco: Never Used  . Alcohol use No  . Drug use: No  . Sexual activity: No   Other Topics Concern  . Not on file   Social History Narrative   Health Care POA:    Emergency Contact: son Legrand Como   End of Life Plan: reports done, encourage to bring [place] a copy   Who lives with you: self- retirement community   Any pets: none   Diet: Pt has varied diet of protein, starch, vegetables   Exercise: Pt reports walking almost every day   Seatbelts: Pt reports wearing seatbelt when in vehicles.    Sun Exposure/Protection: sunglasses   Hobbies: church, walking, visiting friends                Review of Systems: A 12 point ROS discussed and pertinent positives  are indicated in the HPI above.  All other systems are negative.  Review of Systems  Constitutional: Negative.   Respiratory: Negative.   Cardiovascular: Negative.   Gastrointestinal: Negative.   Genitourinary: Negative.   Musculoskeletal: Negative.   Neurological: Negative.     Vital Signs: BP (!) 171/78   Pulse 64   Temp 97.9 F (36.6 C) (Oral)   Resp 17   Ht 5\' 3"  (1.6 m)   Wt 159 lb (72.1 kg)   SpO2 96%   BMI 28.17 kg/m   Physical Exam  Constitutional: She is oriented to person, place, and time. She  appears well-developed and well-nourished. No distress.  Abdominal: Soft. She exhibits no distension. There is no tenderness.  Musculoskeletal: She exhibits no edema.  Neurological: She is alert and oriented to person, place, and time.  Skin: She is not diaphoretic.  Vitals reviewed.   Imaging: Mr Abdomen Wwo Contrast  Result Date: 07/03/2016 CLINICAL DATA:  Suspected cystic left renal cell carcinoma in the left kidney upper pole. EXAM: MRI ABDOMEN WITHOUT AND WITH CONTRAST TECHNIQUE: Multiplanar multisequence MR imaging of the abdomen was performed both before and after the administration of intravenous contrast. CONTRAST:  29mL MULTIHANCE GADOBENATE DIMEGLUMINE 529 MG/ML IV SOLN COMPARISON:  05/20/2014 FINDINGS: Lower chest: Bandlike scarring with nodular component in the right lower lobe, for example on image 15/5, not appreciably changed compared to the exam from 2 years ago, maximum thickness of the nodular component 1.6 cm on image 15/5, previously 1.7 cm. Moderate cardiomegaly.  Lower sternal wires noted. Hepatobiliary: Multiple gallstones in the gallbladder measuring up to 1.2 cm in long axis. No biliary dilatation. Mild transient hepatic attenuation difference peripherally in the right hepatic lobe on image 14/901, obscured by motion artifact. The only lesion in this vicinity is a 0.7 by 0.5 cm T2 signal hyperintensity which is thought to likely have enhancement and  delayed enhancement favoring a small hemangioma. There is a small cyst in segment 4 of the liver on image 29/904, about 3 mm in diameter. Pancreas: 1.0 by 0.7 cm cystic lesion along the uncinate process of the pancreas on image 25/4 common no change from prior. A the Spleen:  Unremarkable Adrenals/Urinary Tract:  Adrenal glands normal. 1.7 by 1.2 cm partially exophytic lesion from the right kidney upper pole has fatty component internally compatible with angiomyolipoma. 2.2 by 1.6 cm partially cystic mass of the left kidney upper pole, with thick enhancing internal septation and enhancing margins on image 30/901, essentially stable from 07/17/2012 in terms of size. Multiple other Bosniak category 1 and category 2 cysts are present in the kidneys. Stomach/Bowel: Unremarkable Vascular/Lymphatic: Aortoiliac atherosclerotic vascular disease. Scattered small porta hepatis/peripancreatic lymph nodes are present. Portacaval node 1.2 cm in short axis, within normal limits for this location. Other:  No supplemental non-categorized findings. Musculoskeletal: Mild lumbar spondylosis and degenerative disc disease. IMPRESSION: 1. Stable appearance of the Bosniak category 3 cystic lesion with thick enhancing walls in the left kidney upper pole, not appreciably changed from the 2014 MRI, likely a small indolent cystic renal cell carcinoma. 2. Other bilateral Bosniak category 1 and category 2 cysts are present bilaterally. 3. Right kidney upper pole angiomyolipoma. 4. Cholelithiasis. 5. Stable nodular scarring in the right lower lobe. 6. Small chronic focus of transient hepatic attenuation difference peripherally in the right hepatic lobe associated with a small hemangioma. 7.  Aortic Atherosclerosis (ICD10-I70.0). 8. Considerable cardiomegaly. Electronically Signed   By: Van Clines M.D.   On: 07/03/2016 10:49    Labs:  BMP:  Recent Labs  06/19/16 1120  BUN 23  CREATININE 1.37*  GFRNONAA 36*  GFRAA 42*     Assessment and Plan:  Follow-up MRI was performed earlier today demonstrating a stable cystic lesion of the upper pole of the left kidney measuring approximately 1.6 x 2.2 cm and demonstrating internal septation and enhancing margins. This has now been stable for over 5 years and likely will not have to be ablated. Given stability and underlying renal insufficiency, I have recommended that we stop following the lesion unless she were to develop any symptoms such as flank pain or  hematuria. Mrs. Rourk would like to stop performing any further routine surveillance imaging.  Electronically SignedAletta Edouard T 07/03/2016, 5:16 PM     I spent a total of 15 Minutes in face to face in clinical consultation, greater than 50% of which was counseling/coordinating care for surveillance of a left renal lesion.

## 2016-07-20 ENCOUNTER — Encounter: Payer: Self-pay | Admitting: Interventional Radiology

## 2016-08-17 ENCOUNTER — Emergency Department (HOSPITAL_COMMUNITY)
Admission: EM | Admit: 2016-08-17 | Discharge: 2016-08-17 | Disposition: A | Discharge status: Home / Self Care | Payer: Medicare Other | Attending: Emergency Medicine | Admitting: Emergency Medicine

## 2016-08-17 ENCOUNTER — Emergency Department (HOSPITAL_COMMUNITY): Payer: Medicare Other

## 2016-08-17 ENCOUNTER — Encounter (HOSPITAL_COMMUNITY): Payer: Self-pay | Admitting: Emergency Medicine

## 2016-08-17 DIAGNOSIS — J811 Chronic pulmonary edema: Secondary | ICD-10-CM | POA: Diagnosis not present

## 2016-08-17 DIAGNOSIS — S3993XA Unspecified injury of pelvis, initial encounter: Secondary | ICD-10-CM | POA: Diagnosis not present

## 2016-08-17 DIAGNOSIS — S199XXA Unspecified injury of neck, initial encounter: Secondary | ICD-10-CM | POA: Diagnosis not present

## 2016-08-17 DIAGNOSIS — N183 Chronic kidney disease, stage 3 (moderate): Secondary | ICD-10-CM | POA: Diagnosis not present

## 2016-08-17 DIAGNOSIS — Z8673 Personal history of transient ischemic attack (TIA), and cerebral infarction without residual deficits: Secondary | ICD-10-CM | POA: Diagnosis not present

## 2016-08-17 DIAGNOSIS — Y9389 Activity, other specified: Secondary | ICD-10-CM | POA: Diagnosis not present

## 2016-08-17 DIAGNOSIS — Z79899 Other long term (current) drug therapy: Secondary | ICD-10-CM | POA: Diagnosis not present

## 2016-08-17 DIAGNOSIS — S299XXA Unspecified injury of thorax, initial encounter: Secondary | ICD-10-CM | POA: Diagnosis not present

## 2016-08-17 DIAGNOSIS — S301XXA Contusion of abdominal wall, initial encounter: Secondary | ICD-10-CM | POA: Insufficient documentation

## 2016-08-17 DIAGNOSIS — M25551 Pain in right hip: Secondary | ICD-10-CM | POA: Diagnosis not present

## 2016-08-17 DIAGNOSIS — Y999 Unspecified external cause status: Secondary | ICD-10-CM | POA: Diagnosis not present

## 2016-08-17 DIAGNOSIS — I5043 Acute on chronic combined systolic (congestive) and diastolic (congestive) heart failure: Secondary | ICD-10-CM | POA: Insufficient documentation

## 2016-08-17 DIAGNOSIS — R1084 Generalized abdominal pain: Secondary | ICD-10-CM | POA: Diagnosis present

## 2016-08-17 DIAGNOSIS — S3690XA Unspecified injury of unspecified intra-abdominal organ, initial encounter: Secondary | ICD-10-CM | POA: Diagnosis not present

## 2016-08-17 DIAGNOSIS — I130 Hypertensive heart and chronic kidney disease with heart failure and stage 1 through stage 4 chronic kidney disease, or unspecified chronic kidney disease: Secondary | ICD-10-CM | POA: Insufficient documentation

## 2016-08-17 DIAGNOSIS — Y929 Unspecified place or not applicable: Secondary | ICD-10-CM | POA: Diagnosis not present

## 2016-08-17 DIAGNOSIS — R51 Headache: Secondary | ICD-10-CM | POA: Insufficient documentation

## 2016-08-17 DIAGNOSIS — S0990XA Unspecified injury of head, initial encounter: Secondary | ICD-10-CM | POA: Diagnosis not present

## 2016-08-17 DIAGNOSIS — R100 Acute abdomen: Secondary | ICD-10-CM | POA: Diagnosis not present

## 2016-08-17 LAB — COMPREHENSIVE METABOLIC PANEL
ALT: 23 U/L (ref 14–54)
AST: 34 U/L (ref 15–41)
Albumin: 4.6 g/dL (ref 3.5–5.0)
Alkaline Phosphatase: 59 U/L (ref 38–126)
Anion gap: 10 (ref 5–15)
BUN: 24 mg/dL — ABNORMAL HIGH (ref 6–20)
CO2: 22 mmol/L (ref 22–32)
Calcium: 10.9 mg/dL — ABNORMAL HIGH (ref 8.9–10.3)
Chloride: 104 mmol/L (ref 101–111)
Creatinine, Ser: 1.29 mg/dL — ABNORMAL HIGH (ref 0.44–1.00)
GFR calc Af Amer: 44 mL/min — ABNORMAL LOW (ref >60)
GFR calc non Af Amer: 38 mL/min — ABNORMAL LOW (ref >60)
Glucose, Bld: 154 mg/dL — ABNORMAL HIGH (ref 65–99)
Potassium: 4.5 mmol/L (ref 3.5–5.1)
Sodium: 136 mmol/L (ref 135–145)
Total Bilirubin: 1.3 mg/dL — ABNORMAL HIGH (ref 0.3–1.2)
Total Protein: 7.2 g/dL (ref 6.5–8.1)

## 2016-08-17 LAB — CBC
HCT: 39.3 % (ref 36.0–46.0)
Hemoglobin: 13.5 g/dL (ref 12.0–15.0)
MCH: 31 pg (ref 26.0–34.0)
MCHC: 34.4 g/dL (ref 30.0–36.0)
MCV: 90.1 fL (ref 78.0–100.0)
Platelets: 206 10*3/uL (ref 150–400)
RBC: 4.36 MIL/uL (ref 3.87–5.11)
RDW: 13.1 % (ref 11.5–15.5)
WBC: 8.7 10*3/uL (ref 4.0–10.5)

## 2016-08-17 LAB — URINALYSIS, ROUTINE W REFLEX MICROSCOPIC
Bilirubin Urine: NEGATIVE
Glucose, UA: NEGATIVE mg/dL
Hgb urine dipstick: NEGATIVE
Ketones, ur: NEGATIVE mg/dL
Leukocytes, UA: NEGATIVE
Nitrite: NEGATIVE
Protein, ur: 100 mg/dL — AB
Specific Gravity, Urine: 1.006 (ref 1.005–1.030)
Squamous Epithelial / LPF: NONE SEEN
pH: 7 (ref 5.0–8.0)

## 2016-08-17 LAB — I-STAT CHEM 8, ED
BUN: 29 mg/dL — ABNORMAL HIGH (ref 6–20)
Calcium, Ion: 1.13 mmol/L — ABNORMAL LOW (ref 1.15–1.40)
Chloride: 107 mmol/L (ref 101–111)
Creatinine, Ser: 1.2 mg/dL — ABNORMAL HIGH (ref 0.44–1.00)
Glucose, Bld: 150 mg/dL — ABNORMAL HIGH (ref 65–99)
HCT: 39 % (ref 36.0–46.0)
Hemoglobin: 13.3 g/dL (ref 12.0–15.0)
Potassium: 4.5 mmol/L (ref 3.5–5.1)
Sodium: 138 mmol/L (ref 135–145)
TCO2: 23 mmol/L (ref 0–100)

## 2016-08-17 LAB — I-STAT CG4 LACTIC ACID, ED: Lactic Acid, Venous: 2.29 mmol/L (ref 0.5–1.9)

## 2016-08-17 LAB — CDS SEROLOGY

## 2016-08-17 LAB — SAMPLE TO BLOOD BANK

## 2016-08-17 LAB — ETHANOL: Alcohol, Ethyl (B): <5 mg/dL (ref <5)

## 2016-08-17 LAB — PROTIME-INR
INR: 1.23
Prothrombin Time: 15.6 seconds — ABNORMAL HIGH (ref 11.4–15.2)

## 2016-08-17 MED ORDER — MORPHINE SULFATE (PF) 4 MG/ML IV SOLN
2.0000 mg | Freq: Once | INTRAVENOUS | Status: AC
  Administered 2016-08-17: 2 mg via INTRAVENOUS
  Filled 2016-08-17: qty 1

## 2016-08-17 MED ORDER — IOPAMIDOL (ISOVUE-300) INJECTION 61%
INTRAVENOUS | Status: AC
  Administered 2016-08-17: 80 mL
  Filled 2016-08-17: qty 100

## 2016-08-17 NOTE — ED Notes (Signed)
Portable Xray in progress.

## 2016-08-17 NOTE — ED Notes (Signed)
Patient transported to CT 
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
## 2016-08-17 NOTE — ED Notes (Signed)
ED Provider at bedside. 

## 2016-08-17 NOTE — ED Notes (Signed)
Pt son was on scene and has her keys, purse. Only belongings brought to ED with patient are her clothes and shoes.

## 2016-08-17 NOTE — ED Notes (Signed)
External female catheter placed on the pt on a continues 40 suction.

## 2016-08-17 NOTE — Discharge Instructions (Signed)
Please hold your Eliquis for the next 3 days and then follow up with your primary care provider for recheck.
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
## 2016-08-18 ENCOUNTER — Encounter (HOSPITAL_COMMUNITY): Payer: Self-pay | Admitting: Emergency Medicine

## 2016-08-18 ENCOUNTER — Emergency Department (HOSPITAL_COMMUNITY)
Admission: EM | Admit: 2016-08-18 | Discharge: 2016-08-18 | Disposition: A | Discharge status: Home / Self Care | Payer: Medicare Other | Attending: Emergency Medicine | Admitting: Emergency Medicine

## 2016-08-18 DIAGNOSIS — G8911 Acute pain due to trauma: Secondary | ICD-10-CM | POA: Insufficient documentation

## 2016-08-18 DIAGNOSIS — N183 Chronic kidney disease, stage 3 (moderate): Secondary | ICD-10-CM | POA: Diagnosis not present

## 2016-08-18 DIAGNOSIS — I110 Hypertensive heart disease with heart failure: Secondary | ICD-10-CM | POA: Diagnosis not present

## 2016-08-18 DIAGNOSIS — I129 Hypertensive chronic kidney disease with stage 1 through stage 4 chronic kidney disease, or unspecified chronic kidney disease: Secondary | ICD-10-CM | POA: Insufficient documentation

## 2016-08-18 DIAGNOSIS — I5042 Chronic combined systolic (congestive) and diastolic (congestive) heart failure: Secondary | ICD-10-CM | POA: Diagnosis not present

## 2016-08-18 DIAGNOSIS — E7800 Pure hypercholesterolemia, unspecified: Secondary | ICD-10-CM | POA: Insufficient documentation

## 2016-08-18 DIAGNOSIS — I2510 Atherosclerotic heart disease of native coronary artery without angina pectoris: Secondary | ICD-10-CM | POA: Insufficient documentation

## 2016-08-18 DIAGNOSIS — R079 Chest pain, unspecified: Secondary | ICD-10-CM | POA: Diagnosis not present

## 2016-08-18 DIAGNOSIS — M549 Dorsalgia, unspecified: Secondary | ICD-10-CM | POA: Diagnosis not present

## 2016-08-18 DIAGNOSIS — Z79899 Other long term (current) drug therapy: Secondary | ICD-10-CM | POA: Diagnosis not present

## 2016-08-18 DIAGNOSIS — R52 Pain, unspecified: Secondary | ICD-10-CM

## 2016-08-18 DIAGNOSIS — I252 Old myocardial infarction: Secondary | ICD-10-CM | POA: Diagnosis not present

## 2016-08-18 DIAGNOSIS — I480 Paroxysmal atrial fibrillation: Secondary | ICD-10-CM | POA: Insufficient documentation

## 2016-08-18 DIAGNOSIS — Z7901 Long term (current) use of anticoagulants: Secondary | ICD-10-CM | POA: Insufficient documentation

## 2016-08-18 DIAGNOSIS — Z8673 Personal history of transient ischemic attack (TIA), and cerebral infarction without residual deficits: Secondary | ICD-10-CM | POA: Diagnosis not present

## 2016-08-18 MED ORDER — ACETAMINOPHEN-CODEINE #3 300-30 MG PO TABS
1.0000 | ORAL_TABLET | Freq: Once | ORAL | Status: AC
  Administered 2016-08-18: 1 via ORAL
  Filled 2016-08-18: qty 1

## 2016-08-18 MED ORDER — DICLOFENAC SODIUM 1 % TD GEL: 2.0000 g | Freq: Four times a day (QID) | TRANSDERMAL | 0 refills | Status: DC

## 2016-08-18 MED ORDER — ACETAMINOPHEN-CODEINE #3 300-30 MG PO TABS: 1.0000 | ORAL_TABLET | Freq: Four times a day (QID) | ORAL | 0 refills | Status: DC | PRN
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
## 2016-08-18 NOTE — Discharge Instructions (Signed)
Take Tylenol-3's as needed for pain. Have caution when taking these, as they can increase risk of falling. Do not take extra Tylenol while taking this medication. You will continue to be stiff and sore for the next several days. Follow-up with your primary care provider as discussed yesterday. To the emergency department if you develop any new or worsening symptoms.

## 2016-08-18 NOTE — ED Notes (Signed)
PA in to see pt. 

## 2016-08-20 ENCOUNTER — Other Ambulatory Visit: Payer: Self-pay

## 2016-08-20 NOTE — Patient Outreach (Signed)
Outreach Patient due to ED visit on 08/18/16/.  Patient has a follow up appointment scheduled for 08/20/16.  She knows how to contact Doctor and PCP is accurate in the system.  24 hour Nurse Line was given and patient did not want to complete engagement tool at this time.

## 2016-08-21 ENCOUNTER — Encounter: Payer: Self-pay | Admitting: Internal Medicine

## 2016-08-21 ENCOUNTER — Ambulatory Visit (INDEPENDENT_AMBULATORY_CARE_PROVIDER_SITE_OTHER): Payer: Medicare Other | Admitting: Internal Medicine

## 2016-08-21 DIAGNOSIS — T148XXA Other injury of unspecified body region, initial encounter: Secondary | ICD-10-CM | POA: Diagnosis not present

## 2016-08-21 DIAGNOSIS — R911 Solitary pulmonary nodule: Secondary | ICD-10-CM

## 2016-08-21 DIAGNOSIS — E041 Nontoxic single thyroid nodule: Secondary | ICD-10-CM

## 2016-08-21 MED ORDER — ACETAMINOPHEN-CODEINE #3 300-30 MG PO TABS: 1.0000 | ORAL_TABLET | Freq: Four times a day (QID) | ORAL | 0 refills | Status: DC | PRN

## 2016-08-21 NOTE — Assessment & Plan Note (Signed)
CT chest on 7/13 with pulmonary nodule  Needs a 6-12 month CT chest

## 2016-08-21 NOTE — Patient Instructions (Signed)
Please do not take tylenol 3 and ambien at the same time. This medication does have a increased fall risk so be careful. Please follow up in 4 weeks with your PCP. If worsening can follow up sooner

## 2016-08-21 NOTE — Assessment & Plan Note (Signed)
Noted on CT cervical scan  Recommend for thyroid ultrasound at next appointment  Consider TSH and T4 as needed Discussed with PCP, will follow up with patient in 4 weeks
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
## 2016-08-24 ENCOUNTER — Ambulatory Visit (INDEPENDENT_AMBULATORY_CARE_PROVIDER_SITE_OTHER): Payer: Medicare Other | Admitting: Family Medicine

## 2016-08-24 ENCOUNTER — Encounter: Payer: Self-pay | Admitting: Family Medicine

## 2016-08-24 VITALS — BP 146/88 | HR 66 | Temp 98.5°F | Wt 160.0 lb

## 2016-08-24 DIAGNOSIS — T148XXA Other injury of unspecified body region, initial encounter: Secondary | ICD-10-CM | POA: Diagnosis not present

## 2016-08-24 LAB — POCT HEMOGLOBIN: Hemoglobin: 12.4 g/dL (ref 12.2–16.2)

## 2016-08-24 MED ORDER — HYDROCODONE-ACETAMINOPHEN 5-325 MG PO TABS: 1.0000 | ORAL_TABLET | Freq: Four times a day (QID) | ORAL | 0 refills | Status: DC | PRN
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
## 2016-08-24 NOTE — Patient Instructions (Addendum)
  I'm sorry you're hurting so badly!  Please take miralax every day. Please stop taking the Tylenol 3, take the Norco instead every 6 hours as needed for pain.  If you develop fever, chills, shortness of breath, or lightheadness after standing, please go to the ED.  Please see Dr. Erin Hearing on Wednesday 7/25.   Lucila Maine, DO PGY-2, [facility] 08/24/2016 11:55 AM

## 2016-08-25 ENCOUNTER — Encounter (HOSPITAL_COMMUNITY): Payer: Self-pay

## 2016-08-25 ENCOUNTER — Emergency Department (HOSPITAL_COMMUNITY): Payer: Medicare Other

## 2016-08-25 ENCOUNTER — Emergency Department (HOSPITAL_COMMUNITY)
Admission: EM | Admit: 2016-08-25 | Discharge: 2016-08-25 | Disposition: A | Discharge status: Home / Self Care | Payer: Medicare Other | Attending: Emergency Medicine | Admitting: Emergency Medicine

## 2016-08-25 DIAGNOSIS — Z85528 Personal history of other malignant neoplasm of kidney: Secondary | ICD-10-CM | POA: Insufficient documentation

## 2016-08-25 DIAGNOSIS — R5383 Other fatigue: Secondary | ICD-10-CM | POA: Diagnosis not present

## 2016-08-25 DIAGNOSIS — R0602 Shortness of breath: Secondary | ICD-10-CM

## 2016-08-25 DIAGNOSIS — I252 Old myocardial infarction: Secondary | ICD-10-CM | POA: Insufficient documentation

## 2016-08-25 DIAGNOSIS — S299XXA Unspecified injury of thorax, initial encounter: Secondary | ICD-10-CM | POA: Diagnosis not present

## 2016-08-25 DIAGNOSIS — I2510 Atherosclerotic heart disease of native coronary artery without angina pectoris: Secondary | ICD-10-CM | POA: Diagnosis not present

## 2016-08-25 DIAGNOSIS — R404 Transient alteration of awareness: Secondary | ICD-10-CM | POA: Diagnosis not present

## 2016-08-25 DIAGNOSIS — I5042 Chronic combined systolic (congestive) and diastolic (congestive) heart failure: Secondary | ICD-10-CM | POA: Insufficient documentation

## 2016-08-25 DIAGNOSIS — R52 Pain, unspecified: Secondary | ICD-10-CM | POA: Diagnosis not present

## 2016-08-25 DIAGNOSIS — R531 Weakness: Secondary | ICD-10-CM | POA: Diagnosis present

## 2016-08-25 DIAGNOSIS — Z79899 Other long term (current) drug therapy: Secondary | ICD-10-CM | POA: Insufficient documentation

## 2016-08-25 DIAGNOSIS — R079 Chest pain, unspecified: Secondary | ICD-10-CM | POA: Diagnosis not present

## 2016-08-25 DIAGNOSIS — I130 Hypertensive heart and chronic kidney disease with heart failure and stage 1 through stage 4 chronic kidney disease, or unspecified chronic kidney disease: Secondary | ICD-10-CM | POA: Diagnosis not present

## 2016-08-25 DIAGNOSIS — Z8673 Personal history of transient ischemic attack (TIA), and cerebral infarction without residual deficits: Secondary | ICD-10-CM | POA: Diagnosis not present

## 2016-08-25 DIAGNOSIS — Z7901 Long term (current) use of anticoagulants: Secondary | ICD-10-CM | POA: Diagnosis not present

## 2016-08-25 DIAGNOSIS — N183 Chronic kidney disease, stage 3 (moderate): Secondary | ICD-10-CM | POA: Diagnosis not present

## 2016-08-25 DIAGNOSIS — R1010 Upper abdominal pain, unspecified: Secondary | ICD-10-CM | POA: Diagnosis not present

## 2016-08-25 LAB — CBC
HCT: 38.3 % (ref 36.0–46.0)
Hemoglobin: 12.7 g/dL (ref 12.0–15.0)
MCH: 30.3 pg (ref 26.0–34.0)
MCHC: 33.2 g/dL (ref 30.0–36.0)
MCV: 91.4 fL (ref 78.0–100.0)
Platelets: 210 10*3/uL (ref 150–400)
RBC: 4.19 MIL/uL (ref 3.87–5.11)
RDW: 13.6 % (ref 11.5–15.5)
WBC: 9.1 10*3/uL (ref 4.0–10.5)

## 2016-08-25 LAB — COMPREHENSIVE METABOLIC PANEL
ALT: 24 U/L (ref 14–54)
AST: 34 U/L (ref 15–41)
Albumin: 4.3 g/dL (ref 3.5–5.0)
Alkaline Phosphatase: 60 U/L (ref 38–126)
Anion gap: 12 (ref 5–15)
BUN: 22 mg/dL — ABNORMAL HIGH (ref 6–20)
CO2: 21 mmol/L — ABNORMAL LOW (ref 22–32)
Calcium: 9.7 mg/dL (ref 8.9–10.3)
Chloride: 105 mmol/L (ref 101–111)
Creatinine, Ser: 1.11 mg/dL — ABNORMAL HIGH (ref 0.44–1.00)
GFR calc Af Amer: 53 mL/min — ABNORMAL LOW (ref >60)
GFR calc non Af Amer: 45 mL/min — ABNORMAL LOW (ref >60)
Glucose, Bld: 135 mg/dL — ABNORMAL HIGH (ref 65–99)
Potassium: 4.4 mmol/L (ref 3.5–5.1)
Sodium: 138 mmol/L (ref 135–145)
Total Bilirubin: 1.7 mg/dL — ABNORMAL HIGH (ref 0.3–1.2)
Total Protein: 7.1 g/dL (ref 6.5–8.1)

## 2016-08-25 LAB — I-STAT TROPONIN, ED
Troponin i, poc: 0.02 ng/mL (ref 0.00–0.08)
Troponin i, poc: 0.02 ng/mL (ref 0.00–0.08)

## 2016-08-25 LAB — URINALYSIS, ROUTINE W REFLEX MICROSCOPIC
Bacteria, UA: NONE SEEN
Bilirubin Urine: NEGATIVE
Glucose, UA: NEGATIVE mg/dL
Hgb urine dipstick: NEGATIVE
Ketones, ur: NEGATIVE mg/dL
Leukocytes, UA: NEGATIVE
Nitrite: NEGATIVE
Protein, ur: 30 mg/dL — AB
Specific Gravity, Urine: 1.008 (ref 1.005–1.030)
Squamous Epithelial / LPF: NONE SEEN
pH: 7 (ref 5.0–8.0)

## 2016-08-25 MED ORDER — IOPAMIDOL (ISOVUE-370) INJECTION 76%
INTRAVENOUS | Status: AC
  Administered 2016-08-25: 80 mL
  Filled 2016-08-25: qty 100

## 2016-08-25 MED ORDER — HYDROCODONE-ACETAMINOPHEN 5-325 MG PO TABS
1.0000 | ORAL_TABLET | Freq: Once | ORAL | Status: AC
  Administered 2016-08-25: 1 via ORAL
  Filled 2016-08-25: qty 1

## 2016-08-25 NOTE — Discharge Instructions (Signed)
Your blood work and CT scan did not show explanation for your shortness of breath. It is possible that you may have had a panic attack. Continue to take all your regular medications. Continue pain medications. Follow up with family doctor as needed.

## 2016-08-25 NOTE — ED Notes (Signed)
Patient transported to CT 

## 2016-08-25 NOTE — ED Notes (Signed)
Patient transported to X-ray 

## 2016-08-25 NOTE — ED Notes (Addendum)
Pt with numerous family members visiting. Jodi [name], NT explained to family earlier that only 2 visitors were permitted at a time. Upon rounding, 5 visitors in pt's room. This nurse reminded family of policy, explained reasoning for visitation rules, and requesting family switch between the room and lobby to ensure no more than 2 people were in the room at a time. Pt family appear agitated but verbalized understanding.
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
## 2016-08-25 NOTE — ED Notes (Signed)
ED Provider at bedside. 

## 2016-08-27 ENCOUNTER — Telehealth: Payer: Self-pay | Admitting: Family Medicine

## 2016-08-27 NOTE — Telephone Encounter (Signed)
Feels about the same maybe a little better No increased shortness of breath or fever  Will see me on Wed unless feeling better

## 2016-08-28 DIAGNOSIS — H35372 Puckering of macula, left eye: Secondary | ICD-10-CM | POA: Diagnosis not present

## 2016-08-28 DIAGNOSIS — H35371 Puckering of macula, right eye: Secondary | ICD-10-CM | POA: Diagnosis not present

## 2016-08-28 DIAGNOSIS — H353222 Exudative age-related macular degeneration, left eye, with inactive choroidal neovascularization: Secondary | ICD-10-CM | POA: Diagnosis not present

## 2016-08-28 DIAGNOSIS — H353124 Nonexudative age-related macular degeneration, left eye, advanced atrophic with subfoveal involvement: Secondary | ICD-10-CM | POA: Diagnosis not present

## 2016-08-29 ENCOUNTER — Ambulatory Visit: Payer: Medicare Other | Admitting: Family Medicine

## 2016-09-10 ENCOUNTER — Other Ambulatory Visit: Payer: Self-pay | Admitting: Family Medicine

## 2016-09-12 ENCOUNTER — Ambulatory Visit: Payer: Medicare Other | Admitting: Family Medicine

## 2016-09-26 ENCOUNTER — Ambulatory Visit (INDEPENDENT_AMBULATORY_CARE_PROVIDER_SITE_OTHER): Payer: Medicare Other | Admitting: Family Medicine

## 2016-09-26 ENCOUNTER — Encounter: Payer: Self-pay | Admitting: Family Medicine

## 2016-09-26 DIAGNOSIS — J208 Acute bronchitis due to other specified organisms: Secondary | ICD-10-CM | POA: Diagnosis not present

## 2016-09-26 DIAGNOSIS — R0609 Other forms of dyspnea: Secondary | ICD-10-CM

## 2016-09-26 DIAGNOSIS — R0600 Dyspnea, unspecified: Secondary | ICD-10-CM

## 2016-09-26 DIAGNOSIS — T148XXA Other injury of unspecified body region, initial encounter: Secondary | ICD-10-CM

## 2016-09-26 MED ORDER — ALBUTEROL SULFATE HFA 108 (90 BASE) MCG/ACT IN AERS: 1.0000 | INHALATION_SPRAY | Freq: Four times a day (QID) | RESPIRATORY_TRACT | 0 refills | Status: DC | PRN
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
## 2016-09-26 NOTE — Assessment & Plan Note (Signed)
Recurrent worsened.  Very likely cardiac due to her low EF.  No signs of fluid overload today.  She would like to try albuterol since she thinks might have helped in the past.  Will try low dose 1 puff

## 2016-09-26 NOTE — Patient Instructions (Addendum)
Good to see you today!  Thanks for coming in.  Try the albuterol one puff as needed.  If you have any chest pain then stop it  See Dr Einar Gip about the shortness of breath   Have fun

## 2016-09-26 NOTE — Assessment & Plan Note (Signed)
Improved.  Seem to be recovering well

## 2016-10-02 ENCOUNTER — Ambulatory Visit (INDEPENDENT_AMBULATORY_CARE_PROVIDER_SITE_OTHER): Payer: Medicare Other | Admitting: *Deleted

## 2016-10-02 ENCOUNTER — Encounter: Payer: Self-pay | Admitting: *Deleted

## 2016-10-02 VITALS — BP 112/74 | HR 60 | Temp 98.4°F | Ht 63.0 in | Wt 157.2 lb

## 2016-10-02 DIAGNOSIS — Z23 Encounter for immunization: Secondary | ICD-10-CM

## 2016-10-02 DIAGNOSIS — Z0000 Encounter for general adult medical examination without abnormal findings: Secondary | ICD-10-CM | POA: Diagnosis not present

## 2016-10-02 DIAGNOSIS — R739 Hyperglycemia, unspecified: Secondary | ICD-10-CM

## 2016-10-02 LAB — POCT GLYCOSYLATED HEMOGLOBIN (HGB A1C): Hemoglobin A1C: 6.1

## 2016-10-02 NOTE — Progress Notes (Signed)
I have reviewed this visit and discussed with Jodi Rucks, RN, BSN, and agree with her documentation.  [name]       Subjective:   Jodi [name] is a 83 y.o. female who presents for Medicare Annual (Subsequent) preventive examination.  Cardiac Risk Factors include: advanced age (>66men, >40 women);dyslipidemia;hypertension;sedentary lifestyle     Objective:     Vitals: BP 112/74 (BP Location: Left Arm, Patient Position: Sitting, Cuff Size: Normal)   Pulse 60   Temp 98.4 F (36.9 C) (Oral)   Ht 5\' 3"  (1.6 m)   Wt 157 lb 3.2 oz (71.3 kg)   SpO2 97%   BMI 27.85 kg/m   Body mass index is 27.85 kg/m.   Tobacco History  Smoking Status  . Never Smoker  Smokeless Tobacco  . Never Used   Patient reports smoking total of 4-5 packs of cigarettes in youth. Has no plans to restart.  Past Medical History:  Diagnosis Date  . Aortic stenosis   . Arthritis    "maybe in my fingers and toes" (09/28/2014)  . Bradycardia   . CHF (congestive heart failure) ([place])   . Chronic renal insufficiency, stage III (moderate)    [name] 09/27/2014  . Coronary artery disease   . Heart murmur   . Hyperlipidemia   . Hypertension   . Macular degeneration, left eye   . Myocardial infarction [facility]) 1995; 2003  . Paroxysmal atrial fibrillation (HCC)   . Renal cell carcinoma ([place]) 09/23/2012  . Renal mass 12/06/2010   CT Abdomen 11-27-10 upper pole region of the right kidney which is suspicious for solid lesion, measuring 1.9 x 1.3 cm. This lesion is concerning for renal cell carcinoma given the solid appearance.  Following with Dr [name].  Renal bx was benign    . Shortness of breath   . Splenic infarct 12/06/2010  . Stroke [facility]) 2003   "when I had heart surgery"; denies residual on 09/28/2014  . TIA (transient ischemic attack) "several"   Past Surgical History:  Procedure Laterality Date  . CARDIAC CATHETERIZATION    . CARDIOVERSION N/A 10/20/2013   Procedure:  CARDIOVERSION;  Surgeon: Laverda Page, MD;  Location: [place];  Service: Cardiovascular;  Laterality: N/A;  H&P in file  . CARDIOVERSION N/A 05/18/2014   Procedure: CARDIOVERSION;  Surgeon: [name], MD;  Location: [place];  Service: Cardiovascular;  Laterality: N/A;  . CATARACT EXTRACTION W/ INTRAOCULAR LENS  IMPLANT, BILATERAL Bilateral ~ 2013  . CORONARY ANGIOPLASTY    . CORONARY ARTERY BYPASS GRAFT  1995 and 2003  . IR RADIOLOGIST EVAL & MGMT  07/03/2016  . LEFT AND RIGHT HEART CATHETERIZATION WITH CORONARY ANGIOGRAM N/A 11/24/2013   Procedure: LEFT AND RIGHT HEART CATHETERIZATION WITH CORONARY ANGIOGRAM;  Surgeon: Laverda Page, MD;  Location: [facility] CATH LAB;  Service: Cardiovascular;  Laterality: N/A;  . PERCUTANEOUS NEEDLE BIOPSY OF RENAL LESION  ?2014  . TOTAL ABDOMINAL HYSTERECTOMY  1990's   "both ovaries were full of little tiny sores"  . TYMPANOPLASTY Bilateral    "had holes in them; still have holes in them"   Family History  Problem Relation Age of Onset  . Coronary artery disease Mother   . Stroke Mother   . Leukemia Father   . Leukemia Sister   . Cancer Brother   . Coronary artery disease Brother   . Heart attack Brother   . Heart attack Son   . Heart attack Daughter   . Myelodysplastic syndrome Sister  History  Sexual Activity  . Sexual activity: No    Outpatient Encounter Prescriptions as of 10/02/2016  Medication Sig  . albuterol (PROVENTIL HFA;VENTOLIN HFA) 108 (90 Base) MCG/ACT inhaler Inhale 1 puff into the lungs every 6 (six) hours as needed for wheezing or shortness of breath.  Marland Kitchen amLODipine (NORVASC) 10 MG tablet Take 10 mg by mouth daily.   Marland Kitchen apixaban (ELIQUIS) 5 MG TABS tablet Take 1 tablet (5 mg total) by mouth 2 (two) times daily.  . beta carotene w/minerals (OCUVITE) tablet Take 1 tablet by mouth daily.  . Cholecalciferol (VITAMIN D3) 2000 UNITS TABS Take 2,000 Units by mouth daily at 12 noon.   Marland Kitchen CRANBERRY PO Take 2 capsules by  mouth daily with lunch.  . dofetilide (TIKOSYN) 125 MCG capsule Take 1 capsule (125 mcg total) by mouth 2 (two) times daily.  . furosemide (LASIX) 20 MG tablet Take 1 tablet (20 mg total) by mouth daily. Daily until your hospital follow up.  Marland Kitchen HYDROcodone-acetaminophen (NORCO) 5-325 MG tablet Take 1 tablet by mouth every 6 (six) hours as needed for moderate pain.  . Multiple Vitamin (MULTIVITAMIN WITH MINERALS) TABS tablet Take 1 tablet by mouth daily.  . rosuvastatin (CRESTOR) 20 MG tablet TAKE 1 TABLET BY MOUTH DAILY (Patient taking differently: TAKE 20 MG BY MOUTH DAILY)  . spironolactone (ALDACTONE) 25 MG tablet TAKE 1 TABLET BY MOUTH EVERY DAY.  Marland Kitchen zolpidem (AMBIEN) 5 MG tablet Take 1 tablet (5 mg total) by mouth at bedtime as needed for sleep (Do not take more than 2-3 times per week).  . [DISCONTINUED] diclofenac sodium (VOLTAREN) 1 % GEL Apply 2 g topically 4 (four) times daily. (Patient not taking: Reported on 08/25/2016)   No facility-administered encounter medications on file as of 10/02/2016.     Activities of Daily Living In your present state of health, do you have any difficulty performing the following activities: 10/02/2016  Hearing? Y  Vision? Y  Difficulty concentrating or making decisions? N  Walking or climbing stairs? Y  Dressing or bathing? N  Doing errands, shopping? N  Preparing Food and eating ? N  Using the Toilet? N  In the past six months, have you accidently leaked urine? Y  Do you have problems with loss of bowel control? N  Managing your Medications? N  Managing your Finances? N  Housekeeping or managing your Housekeeping? N  Some recent data might be hidden   Home Safety:  My home has a working smoke alarm:  Yes X 1           My home throw rugs have been fastened down to the floor or removed:  Non-slip backs I have a non-slip surface or non-slip mats in the bathtub and shower:  Yes and grab bars        All my home's stairs have handrails, including any  outdoor stairs  One level home with 3 outside steps with handrails on both sides.         My home's floors, stairs and hallways are free from clutter, wires and cords:  Yes     I have animals in my home  No I wear seatbelts consistently:  Yes   Patient Care Team: [name], MD as PCP - General [name], MD (Cardiology) Monna Fam, MD (Ophthalmology) Iantha Fallen (Dentistry)    Assessment:     Exercise Activities and Dietary recommendations Current Exercise Habits: The patient does not participate in regular exercise at present, Exercise  limited by: respiratory conditions(s);cardiac condition(s)  Goals    . Eat more fruits and vegetables    . maintain current level of physical activity and increase if notice less shortness of breath with albuterol      Patient had been walking one mile daily but has had increased SHOB on exertion. PCP and cardiologist aware. Currently walking 10 minutes 4 X per week.  Fall Risk Fall Risk  10/03/2016 09/26/2016 08/24/2016 06/27/2016 11/09/2015  Falls in the past year? No No No No No  Number falls in past yr: - - - - -  Injury with Fall? - - - - -   TUG Test:  Done in 12 seconds. Patient used both hands to push out of chair and one hand to sit back down. Patient wearing flip flops. Discussed wearing closed, well-fitting shoes to decrease risk of fall. Falls prevention discussed in detail and literature given.  Cognitive Function: Mini-Cog  Passed with score 5/5   Depression Screen PHQ 2/9 Scores 10/03/2016 09/26/2016 08/24/2016 08/21/2016  PHQ - 2 Score 0 0 0 0     Cognitive Function MMSE - Mini Mental State Exam 04/24/2013  Orientation to time 5  Orientation to Place 5  Registration 3  Attention/ Calculation 5  Recall 3  Language- name 2 objects 2  Language- repeat 1  Language- follow 3 step command 3  Language- read & follow direction 1  Write a sentence 1  Copy design 1  Total score 30        Immunization  History  Administered Date(s) Administered  . Influenza Split 10/25/2010, 10/22/2011, 09/25/2013  . Influenza Whole 11/07/2006, 11/10/2007, 11/15/2008  . Influenza,inj,Quad PF,6+ Mos 11/10/2012, 10/25/2014, 11/09/2015, 10/02/2016  . Pneumococcal Polysaccharide-23 01/06/1995  . Td 02/06/1987  . Zoster 08/05/2009   Screening Tests Health Maintenance  Topic Date Due  . DTaP/Tdap/Td (1 - Tdap) 02/07/1987  . TETANUS/TDAP  02/05/1997  . INFLUENZA VACCINE  Completed  . DEXA SCAN  Completed   Flu vaccine administered today Patient will consider getting Td vaccine Hgb A1c done today = 6.1. Last A1c 5.9 on 11/09/2015    Plan:     Flu vaccine administered today Patient will consider getting Td vaccine Hgb A1c done today = 6.1. Last A1c 5.9 on 11/09/2015  I have personally reviewed and noted the following in the patient's chart:   . Medical and social history . Use of alcohol, tobacco or illicit drugs  . Current medications and supplements . Functional ability and status . Nutritional status . Physical activity . Advanced directives . List of other physicians . Hospitalizations, surgeries, and ER visits in previous 12 months . Vitals . Screenings to include cognitive, depression, and falls . Referrals and appointments  In addition, I have reviewed and discussed with patient certain preventive protocols, quality metrics, and best practice recommendations. A written personalized care plan for preventive services as well as general preventive health recommendations were provided to patient.     Velora Heckler, RN  10/03/2016

## 2016-10-02 NOTE — Patient Instructions (Addendum)
Ms. Kelsay,  Thank you for taking time to come for yourMedicare Wellness Visit. I appreciate your ongoing commitment to your health goals. Please review the following plan we discussed and let me know if I can assist you in the future.   These are the goals we discussed:  Goals    . Eat more fruits and vegetables    . maintain current level of physical activity and increase if notice less shortness of breath with albuterol        Fall Prevention in the Home Falls can cause injuries. They can happen to people of all ages. There are many things you can do to make your home safe and to help prevent falls. What can I do on the outside of my home?  Regularly fix the edges of walkways and driveways and fix any cracks.  Remove anything that might make you trip as you walk through a door, such as a raised step or threshold.  Trim any bushes or trees on the path to your home.  Use bright outdoor lighting.  Clear any walking paths of anything that might make someone trip, such as rocks or tools.  Regularly check to see if handrails are loose or broken. Make sure that both sides of any steps have handrails.  Any raised decks and porches should have guardrails on the edges.  Have any leaves, snow, or ice cleared regularly.  Use sand or salt on walking paths during winter.  Clean up any spills in your garage right away. This includes oil or grease spills. What can I do in the bathroom?  Use night lights.  Install grab bars by the toilet and in the tub and shower. Do not use towel bars as grab bars.  Use non-skid mats or decals in the tub or shower.  If you need to sit down in the shower, use a plastic, non-slip stool.  Keep the floor dry. Clean up any water that spills on the floor as soon as it happens.  Remove soap buildup in the tub or shower regularly.  Attach bath mats securely with double-sided non-slip rug tape.  Do not have throw rugs and other things on the floor that  can make you trip. What can I do in the bedroom?  Use night lights.  Make sure that you have a light by your bed that is easy to reach.  Do not use any sheets or blankets that are too big for your bed. They should not hang down onto the floor.  Have a firm chair that has side arms. You can use this for support while you get dressed.  Do not have throw rugs and other things on the floor that can make you trip. What can I do in the kitchen?  Clean up any spills right away.  Avoid walking on wet floors.  Keep items that you use a lot in easy-to-reach places.  If you need to reach something above you, use a strong step stool that has a grab bar.  Keep electrical cords out of the way.  Do not use floor polish or wax that makes floors slippery. If you must use wax, use non-skid floor wax.  Do not have throw rugs and other things on the floor that can make you trip. What can I do with my stairs?  Do not leave any items on the stairs.  Make sure that there are handrails on both sides of the stairs and use them. Fix handrails that  are broken or loose. Make sure that handrails are as long as the stairways.  Check any carpeting to make sure that it is firmly attached to the stairs. Fix any carpet that is loose or worn.  Avoid having throw rugs at the top or bottom of the stairs. If you do have throw rugs, attach them to the floor with carpet tape.  Make sure that you have a light switch at the top of the stairs and the bottom of the stairs. If you do not have them, ask someone to add them for you. What else can I do to help prevent falls?  Wear shoes that: ? Do not have high heels. ? Have rubber bottoms. ? Are comfortable and fit you well. ? Are closed at the toe. Do not wear sandals.  If you use a stepladder: ? Make sure that it is fully opened. Do not climb a closed stepladder. ? Make sure that both sides of the stepladder are locked into place. ? Ask someone to hold it for  you, if possible.  Clearly mark and make sure that you can see: ? Any grab bars or handrails. ? First and last steps. ? Where the edge of each step is.  Use tools that help you move around (mobility aids) if they are needed. These include: ? Canes. ? Walkers. ? Scooters. ? Crutches.  Turn on the lights when you go into a dark area. Replace any light bulbs as soon as they burn out.  Set up your furniture so you have a clear path. Avoid moving your furniture around.  If any of your floors are uneven, fix them.  If there are any pets around you, be aware of where they are.  Review your medicines with your doctor. Some medicines can make you feel dizzy. This can increase your chance of falling. Ask your doctor what other things that you can do to help prevent falls. This information is not intended to replace advice given to you by your health care provider. Make sure you discuss any questions you have with your health care provider. Document Released: 11/18/2008 Document Revised: 06/30/2015 Document Reviewed: 02/26/2014 Elsevier Interactive Patient Education  2018 [employer] Maintenance, Female Adopting a healthy lifestyle and getting preventive care can go a long way to promote health and wellness. Talk with your health care provider about what schedule of regular examinations is right for you. This is a good chance for you to check in with your provider about disease prevention and staying healthy. In between checkups, there are plenty of things you can do on your own. Experts have done a lot of research about which lifestyle changes and preventive measures are most likely to keep you healthy. Ask your health care provider for more information. Weight and diet Eat a healthy diet  Be sure to include plenty of vegetables, fruits, low-fat dairy products, and lean protein.  Do not eat a lot of foods high in solid fats, added sugars, or salt.  Get regular exercise. This is  one of the most important things you can do for your health. ? Most adults should exercise for at least 150 minutes each week. The exercise should increase your heart rate and make you sweat (moderate-intensity exercise). ? Most adults should also do strengthening exercises at least twice a week. This is in addition to the moderate-intensity exercise.  Maintain a healthy weight  Body mass index (BMI) is a measurement that can be used to identify  possible weight problems. It estimates body fat based on height and weight. Your health care provider can help determine your BMI and help you achieve or maintain a healthy weight.  For females 80 years of age and older: ? A BMI below 18.5 is considered underweight. ? A BMI of 18.5 to 24.9 is normal. ? A BMI of 25 to 29.9 is considered overweight. ? A BMI of 30 and above is considered obese.  Watch levels of cholesterol and blood lipids  You should start having your blood tested for lipids and cholesterol at 81 years of age, then have this test every 5 years.  You may need to have your cholesterol levels checked more often if: ? Your lipid or cholesterol levels are high. ? You are older than 81 years of age. ? You are at high risk for heart disease.  Cancer screening Lung Cancer  Lung cancer screening is recommended for adults 48-13 years old who are at high risk for lung cancer because of a history of smoking.  A yearly low-dose CT scan of the lungs is recommended for people who: ? Currently smoke. ? Have quit within the past 15 years. ? Have at least a 30-pack-year history of smoking. A pack year is smoking an average of one pack of cigarettes a day for 1 year.  Yearly screening should continue until it has been 15 years since you quit.  Yearly screening should stop if you develop a health problem that would prevent you from having lung cancer treatment.  Breast Cancer  Practice breast self-awareness. This means understanding how your  breasts normally appear and feel.  It also means doing regular breast self-exams. Let your health care provider know about any changes, no matter how small.  If you are in your 20s or 30s, you should have a clinical breast exam (CBE) by a health care provider every 1-3 years as part of a regular health exam.  If you are 54 or older, have a CBE every year. Also consider having a breast X-ray (mammogram) every year.  If you have a family history of breast cancer, talk to your health care provider about genetic screening.  If you are at high risk for breast cancer, talk to your health care provider about having an MRI and a mammogram every year.  Breast cancer gene (BRCA) assessment is recommended for women who have family members with BRCA-related cancers. BRCA-related cancers include: ? Breast. ? Ovarian. ? Tubal. ? Peritoneal cancers.  Results of the assessment will determine the need for genetic counseling and BRCA1 and BRCA2 testing.  Cervical Cancer Your health care provider may recommend that you be screened regularly for cancer of the pelvic organs (ovaries, uterus, and vagina). This screening involves a pelvic examination, including checking for microscopic changes to the surface of your cervix (Pap test). You may be encouraged to have this screening done every 3 years, beginning at age 61.  For women ages 67-65, health care providers may recommend pelvic exams and Pap testing every 3 years, or they may recommend the Pap and pelvic exam, combined with testing for human papilloma virus (HPV), every 5 years. Some types of HPV increase your risk of cervical cancer. Testing for HPV may also be done on women of any age with unclear Pap test results.  Other health care providers may not recommend any screening for nonpregnant women who are considered low risk for pelvic cancer and who do not have symptoms. Ask your health care provider  if a screening pelvic exam is right for you.  If you  have had past treatment for cervical cancer or a condition that could lead to cancer, you need Pap tests and screening for cancer for at least 20 years after your treatment. If Pap tests have been discontinued, your risk factors (such as having a new sexual partner) need to be reassessed to determine if screening should resume. Some women have medical problems that increase the chance of getting cervical cancer. In these cases, your health care provider may recommend more frequent screening and Pap tests.  Colorectal Cancer  This type of cancer can be detected and often prevented.  Routine colorectal cancer screening usually begins at 81 years of age and continues through 81 years of age.  Your health care provider may recommend screening at an earlier age if you have risk factors for colon cancer.  Your health care provider may also recommend using home test kits to check for hidden blood in the stool.  A small camera at the end of a tube can be used to examine your colon directly (sigmoidoscopy or colonoscopy). This is done to check for the earliest forms of colorectal cancer.  Routine screening usually begins at age 27.  Direct examination of the colon should be repeated every 5-10 years through 81 years of age. However, you may need to be screened more often if early forms of precancerous polyps or small growths are found.  Skin Cancer  Check your skin from head to toe regularly.  Tell your health care provider about any new moles or changes in moles, especially if there is a change in a mole's shape or color.  Also tell your health care provider if you have a mole that is larger than the size of a pencil eraser.  Always use sunscreen. Apply sunscreen liberally and repeatedly throughout the day.  Protect yourself by wearing long sleeves, pants, a wide-brimmed hat, and sunglasses whenever you are outside.  Heart disease, diabetes, and high blood pressure  High blood pressure causes  heart disease and increases the risk of stroke. High blood pressure is more likely to develop in: ? People who have blood pressure in the high end of the normal range (130-139/85-89 mm Hg). ? People who are overweight or obese. ? People who are African American.  If you are 65-81 years of age, have your blood pressure checked every 3-5 years. If you are 46 years of age or older, have your blood pressure checked every year. You should have your blood pressure measured twice-once when you are at a hospital or clinic, and once when you are not at a hospital or clinic. Record the average of the two measurements. To check your blood pressure when you are not at a hospital or clinic, you can use: ? An automated blood pressure machine at a pharmacy. ? A home blood pressure monitor.  If you are between 28 years and 14 years old, ask your health care provider if you should take aspirin to prevent strokes.  Have regular diabetes screenings. This involves taking a blood sample to check your fasting blood sugar level. ? If you are at a normal weight and have a low risk for diabetes, have this test once every three years after 81 years of age. ? If you are overweight and have a high risk for diabetes, consider being tested at a younger age or more often. Preventing infection Hepatitis B  If you have a higher risk for  hepatitis B, you should be screened for this virus. You are considered at high risk for hepatitis B if: ? You were born in a country where hepatitis B is common. Ask your health care provider which countries are considered high risk. ? Your parents were born in a high-risk country, and you have not been immunized against hepatitis B (hepatitis B vaccine). ? You have HIV or AIDS. ? You use needles to inject street drugs. ? You live with someone who has hepatitis B. ? You have had sex with someone who has hepatitis B. ? You get hemodialysis treatment. ? You take certain medicines for  conditions, including cancer, organ transplantation, and autoimmune conditions.  Hepatitis C  Blood testing is recommended for: ? Everyone born from [birth year] through 1963. ? Anyone with known risk factors for hepatitis C.  Sexually transmitted infections (STIs)  You should be screened for sexually transmitted infections (STIs) including gonorrhea and chlamydia if: ? You are sexually active and are younger than 81 years of age. ? You are older than 81 years of age and your health care provider tells you that you are at risk for this type of infection. ? Your sexual activity has changed since you were last screened and you are at an increased risk for chlamydia or gonorrhea. Ask your health care provider if you are at risk.  If you do not have HIV, but are at risk, it may be recommended that you take a prescription medicine daily to prevent HIV infection. This is called pre-exposure prophylaxis (PrEP). You are considered at risk if: ? You are sexually active and do not regularly use condoms or know the HIV status of your partner(s). ? You take drugs by injection. ? You are sexually active with a partner who has HIV.  Talk with your health care provider about whether you are at high risk of being infected with HIV. If you choose to begin PrEP, you should first be tested for HIV. You should then be tested every 3 months for as long as you are taking PrEP. Pregnancy  If you are premenopausal and you may become pregnant, ask your health care provider about preconception counseling.  If you may become pregnant, take 400 to 800 micrograms (mcg) of folic acid every day.  If you want to prevent pregnancy, talk to your health care provider about birth control (contraception). Osteoporosis and menopause  Osteoporosis is a disease in which the bones lose minerals and strength with aging. This can result in serious bone fractures. Your risk for osteoporosis can be identified using a bone density  scan.  If you are 86 years of age or older, or if you are at risk for osteoporosis and fractures, ask your health care provider if you should be screened.  Ask your health care provider whether you should take a calcium or vitamin D supplement to lower your risk for osteoporosis.  Menopause may have certain physical symptoms and risks.  Hormone replacement therapy may reduce some of these symptoms and risks. Talk to your health care provider about whether hormone replacement therapy is right for you. Follow these instructions at home:  Schedule regular health, dental, and eye exams.  Stay current with your immunizations.  Do not use any tobacco products including cigarettes, chewing tobacco, or electronic cigarettes.  If you are pregnant, do not drink alcohol.  If you are breastfeeding, limit how much and how often you drink alcohol.  Limit alcohol intake to no more than 1 drink  per day for nonpregnant women. One drink equals 12 ounces of beer, 5 ounces of wine, or 1 ounces of hard liquor.  Do not use street drugs.  Do not share needles.  Ask your health care provider for help if you need support or information about quitting drugs.  Tell your health care provider if you often feel depressed.  Tell your health care provider if you have ever been abused or do not feel safe at home. This information is not intended to replace advice given to you by your health care provider. Make sure you discuss any questions you have with your health care provider. Document Released: 08/07/2010 Document Revised: 06/30/2015 Document Reviewed: 10/26/2014 Elsevier Interactive Patient Education  [employer].

## 2016-10-03 ENCOUNTER — Encounter: Payer: Self-pay | Admitting: *Deleted

## 2016-10-04 ENCOUNTER — Encounter: Payer: Self-pay | Admitting: *Deleted

## 2016-10-09 ENCOUNTER — Telehealth: Payer: Self-pay | Admitting: Family Medicine

## 2016-10-09 NOTE — Telephone Encounter (Signed)
Pt is calling and would like the doctor to call in some antibiotics for her. She is coughing, and has congestion in her chest. jw

## 2016-10-11 NOTE — Telephone Encounter (Signed)
Pls let her know I am sorry it would not be safe for [place] to call in antibiotics without examining her

## 2016-10-15 ENCOUNTER — Ambulatory Visit
Admission: RE | Admit: 2016-10-15 | Discharge: 2016-10-15 | Disposition: A | Discharge status: Home / Self Care | Payer: Medicare Other | Source: Ambulatory Visit | Attending: Family Medicine | Admitting: Family Medicine

## 2016-10-15 ENCOUNTER — Ambulatory Visit (INDEPENDENT_AMBULATORY_CARE_PROVIDER_SITE_OTHER): Payer: Medicare Other | Admitting: Family Medicine

## 2016-10-15 VITALS — BP 150/88 | HR 73 | Temp 98.0°F | Ht 63.0 in | Wt 159.6 lb

## 2016-10-15 DIAGNOSIS — H7291 Unspecified perforation of tympanic membrane, right ear: Secondary | ICD-10-CM

## 2016-10-15 DIAGNOSIS — J181 Lobar pneumonia, unspecified organism: Secondary | ICD-10-CM | POA: Diagnosis not present

## 2016-10-15 DIAGNOSIS — J189 Pneumonia, unspecified organism: Secondary | ICD-10-CM

## 2016-10-15 MED ORDER — CIPROFLOXACIN-DEXAMETHASONE 0.3-0.1 % OT SUSP: 4.0000 [drp] | Freq: Two times a day (BID) | OTIC | 0 refills | Status: DC

## 2016-10-15 MED ORDER — AMOXICILLIN-POT CLAVULANATE 875-125 MG PO TABS: 1.0000 | ORAL_TABLET | Freq: Two times a day (BID) | ORAL | 0 refills | Status: DC

## 2016-10-15 NOTE — Telephone Encounter (Signed)
Patient has appointment today with Dr. Shawna Orleans.

## 2016-10-15 NOTE — Assessment & Plan Note (Signed)
Likely community-acquired pneumonia given R sided rhonchi on exam. Patient has had no fevers or respiratory distress and is well appearing with good oxygen saturation during ambulation so appears appropriate for outpatient treatment. Will treat with Augmentin to cover possible otitis media of the right tympanic membrane. Will obtain chest x-ray today. Differential also includes CHF exacerbation given questionable worsening orthopnea. Discussed return precautions and close follow-up later this week.

## 2016-10-15 NOTE — Patient Instructions (Signed)
It was good to see you today!  For your breathing - Get a chest xray today - Take antibiotic augmentin for the next 7 days, this antibiotic will also treat your ear - Since you are having ear drainage and the ear drum in your R ear was perforated, please use antibiotic ciprodex ear drops for the next 7 days   Please check-out at the front desk before leaving the clinic. Make an appointment for recheck later this week.    Sign up for My Chart to have easy access to your labs results, and communication with your primary care physician.  Feel free to call with any questions or concerns at any time, at [phone].   Take care,  Dr. Bufford Lope, [place]

## 2016-10-15 NOTE — Progress Notes (Signed)
    Subjective:  Jodi [name] is a 83 y.o. female who presents to the [facility] today with a chief complaint of cough.   HPI:  Cough - runny nose, cough for 2 weeks, sputum whitish was yellow before - Has been taking OTC cough syrup - No fever/chills, no n/v, no diarrhea, no body aches  - has been short of breath with wheezing, tried albuterol inhaler but did not bring any relief, States in perhaps may be starting to need more than one pillow at night - poor appetite, has been staying well hydrated  - has had yellowish drainage from R ear. 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 No tragus tenderness. Both tympanic membranes with rupture. Left tympanic membrane with some scarring. Oropharynx nonerythematous without exudates CV: irregular, loud systolic murmur Pulm: NWOB, crackles  with rhonchi on lower R base GI: Normal bowel sounds present. Soft, Nontender, Nondistended. MSK: L LE with 2+ pitting edema that patient states is chronic for her Skin: warm, dry Neuro: grossly normal, moves all extremities Psych: Normal affect and thought content   Assessment/Plan:  Community acquired pneumonia of right lower lobe of lung ([place]) Likely community-acquired pneumonia given R sided rhonchi on exam. Patient has had no fevers or respiratory distress and is well appearing with good oxygen saturation during  ambulation so appears appropriate for outpatient treatment. Will treat with Augmentin to cover possible otitis media of the right tympanic membrane. Will obtain chest x-ray today. Differential also includes CHF exacerbation given questionable worsening orthopnea. Discussed return precautions and close follow-up later this week.   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 Although TM has been chronically ruptured appears more erythematous with yellow drainage per patient history, so will treat with Ciprodex for 7 days.  Bufford Lope, DO PGY-2, [facility] 10/15/2016 10:52 AM

## 2016-10-18 ENCOUNTER — Ambulatory Visit (INDEPENDENT_AMBULATORY_CARE_PROVIDER_SITE_OTHER): Payer: Medicare Other | Admitting: Family Medicine

## 2016-10-18 ENCOUNTER — Encounter: Payer: Self-pay | Admitting: Family Medicine

## 2016-10-18 VITALS — BP 150/80 | HR 58 | Temp 98.1°F | Wt 158.0 lb

## 2016-10-18 DIAGNOSIS — J208 Acute bronchitis due to other specified organisms: Secondary | ICD-10-CM | POA: Diagnosis not present

## 2016-10-18 NOTE — Patient Instructions (Addendum)
It was good to see you today!  Glad to hear you are feeling better, lungs sound improved today.  For your leg, no imaging necessary. It appears to be healing on its own.    Sign up for My Chart to have easy access to your labs results, and communication with your primary care physician.  Feel free to call with any questions or concerns at any time, at [phone].   Take care,  Dr. Bufford Lope, [place]

## 2016-10-18 NOTE — Progress Notes (Signed)
    Subjective:  Jodi [name] is a 83 y.o. female who presents to the [facility] today for follow up cough.  HPI:  Cough - states that has felt improved on antibiotics, having less shortness of breath and wheezing - Tolerating antibiotics well - Having less drainage from her ear with ear drops - No fevers or chills, no nausea or vomiting, no diarrhea, no body aches - Appetite has improved, has continued to stay well hydrated at home  ROS: Per HPI  Objective:  Physical Exam: BP (!) 150/80   Pulse (!) 58   Temp 98.1 F (36.7 C) (Oral)   Wt 158 lb (71.7 kg)   SpO2 90%   BMI 27.99 kg/m   Gen: NAD, resting comfortably HEENT: R TM with mild erythema and no visible drainage, still with rupture that is chronic. No changes to L TM with chronic scarring CV: irregularly irregular, loud systolic murmur Pulm: NWOB, improved air movement with mild rhonchi, no wheezing or crackles GI: Normal bowel sounds present. Soft, Nontender, Nondistended. MSK: L LE with 1+ pitting edema and small round protrusion on shin with some mild discoloration in the area of trauma, no TTP. Skin: warm, dry Neuro: grossly normal, moves all extremities Psych: Normal affect and thought content   Assessment/Plan:  Community acquired pneumonia of right lower lobe of lung ([place]) Patient seen earlier this week on 9/10 for protective cough and wheezing, was started on Augmentin to cover pneumonia. However chest x-ray was negative. Most likely an acute bronchitis given has had clinical improvement at home as well as on lung exam today. Continue current management and advised patient to finish out antibiotic course   Bufford Lope, DO PGY-2, [facility] 10/18/2016 4:24 PM

## 2016-10-20 NOTE — Assessment & Plan Note (Signed)
Patient seen earlier this week on 9/10 for protective cough and wheezing, was started on Augmentin to cover pneumonia. However chest x-ray was negative. Most likely an acute bronchitis given has had clinical improvement at home as well as on lung exam today. Continue current management and advised patient to finish out antibiotic course

## 2016-10-26 DIAGNOSIS — Z5181 Encounter for therapeutic drug level monitoring: Secondary | ICD-10-CM | POA: Diagnosis not present

## 2016-10-26 DIAGNOSIS — I480 Paroxysmal atrial fibrillation: Secondary | ICD-10-CM | POA: Diagnosis not present

## 2016-10-26 DIAGNOSIS — I080 Rheumatic disorders of both mitral and aortic valves: Secondary | ICD-10-CM | POA: Diagnosis not present

## 2016-10-26 DIAGNOSIS — I2510 Atherosclerotic heart disease of native coronary artery without angina pectoris: Secondary | ICD-10-CM | POA: Diagnosis not present

## 2016-10-29 DIAGNOSIS — H35033 Hypertensive retinopathy, bilateral: Secondary | ICD-10-CM | POA: Diagnosis not present

## 2016-10-29 DIAGNOSIS — H353114 Nonexudative age-related macular degeneration, right eye, advanced atrophic with subfoveal involvement: Secondary | ICD-10-CM | POA: Diagnosis not present

## 2016-10-29 DIAGNOSIS — H353221 Exudative age-related macular degeneration, left eye, with active choroidal neovascularization: Secondary | ICD-10-CM | POA: Diagnosis not present

## 2016-10-29 DIAGNOSIS — H40013 Open angle with borderline findings, low risk, bilateral: Secondary | ICD-10-CM | POA: Diagnosis not present
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
## 2016-10-31 ENCOUNTER — Encounter (HOSPITAL_COMMUNITY): Payer: Self-pay

## 2016-10-31 ENCOUNTER — Ambulatory Visit (HOSPITAL_COMMUNITY)
Admission: RE | Admit: 2016-10-31 | Discharge: 2016-10-31 | Disposition: A | Discharge status: Home / Self Care | Payer: Medicare Other | Source: Ambulatory Visit | Attending: Cardiology | Admitting: Cardiology

## 2016-10-31 ENCOUNTER — Encounter (HOSPITAL_COMMUNITY)
Admission: RE | Disposition: A | Discharge status: Home / Self Care | Payer: Self-pay | Source: Ambulatory Visit | Attending: Cardiology

## 2016-10-31 ENCOUNTER — Ambulatory Visit (HOSPITAL_COMMUNITY): Payer: Medicare Other | Admitting: Certified Registered Nurse Anesthetist

## 2016-10-31 DIAGNOSIS — I272 Pulmonary hypertension, unspecified: Secondary | ICD-10-CM | POA: Diagnosis not present

## 2016-10-31 DIAGNOSIS — I110 Hypertensive heart disease with heart failure: Secondary | ICD-10-CM | POA: Diagnosis not present

## 2016-10-31 DIAGNOSIS — Z951 Presence of aortocoronary bypass graft: Secondary | ICD-10-CM | POA: Diagnosis not present

## 2016-10-31 DIAGNOSIS — G43709 Chronic migraine without aura, not intractable, without status migrainosus: Secondary | ICD-10-CM | POA: Diagnosis not present

## 2016-10-31 DIAGNOSIS — I083 Combined rheumatic disorders of mitral, aortic and tricuspid valves: Secondary | ICD-10-CM | POA: Insufficient documentation

## 2016-10-31 DIAGNOSIS — I481 Persistent atrial fibrillation: Secondary | ICD-10-CM | POA: Diagnosis present

## 2016-10-31 DIAGNOSIS — I2510 Atherosclerotic heart disease of native coronary artery without angina pectoris: Secondary | ICD-10-CM | POA: Insufficient documentation

## 2016-10-31 DIAGNOSIS — I130 Hypertensive heart and chronic kidney disease with heart failure and stage 1 through stage 4 chronic kidney disease, or unspecified chronic kidney disease: Secondary | ICD-10-CM | POA: Diagnosis not present

## 2016-10-31 DIAGNOSIS — Z955 Presence of coronary angioplasty implant and graft: Secondary | ICD-10-CM | POA: Diagnosis not present

## 2016-10-31 DIAGNOSIS — E7800 Pure hypercholesterolemia, unspecified: Secondary | ICD-10-CM | POA: Diagnosis not present

## 2016-10-31 DIAGNOSIS — I5032 Chronic diastolic (congestive) heart failure: Secondary | ICD-10-CM | POA: Insufficient documentation

## 2016-10-31 DIAGNOSIS — N183 Chronic kidney disease, stage 3 (moderate): Secondary | ICD-10-CM | POA: Insufficient documentation

## 2016-10-31 DIAGNOSIS — I5042 Chronic combined systolic (congestive) and diastolic (congestive) heart failure: Secondary | ICD-10-CM | POA: Diagnosis not present

## 2016-10-31 DIAGNOSIS — Z7901 Long term (current) use of anticoagulants: Secondary | ICD-10-CM | POA: Diagnosis not present

## 2016-10-31 DIAGNOSIS — E782 Mixed hyperlipidemia: Secondary | ICD-10-CM | POA: Insufficient documentation

## 2016-10-31 DIAGNOSIS — I480 Paroxysmal atrial fibrillation: Secondary | ICD-10-CM | POA: Diagnosis present

## 2016-10-31 DIAGNOSIS — I495 Sick sinus syndrome: Secondary | ICD-10-CM | POA: Diagnosis not present

## 2016-10-31 DIAGNOSIS — I4891 Unspecified atrial fibrillation: Secondary | ICD-10-CM | POA: Diagnosis not present

## 2016-10-31 HISTORY — PX: CARDIOVERSION: SHX1299

## 2016-10-31 SURGERY — CARDIOVERSION
Anesthesia: General

## 2016-10-31 MED ORDER — PROPOFOL 10 MG/ML IV BOLUS
INTRAVENOUS | Status: DC | PRN
Start: 2016-10-31 — End: 2016-10-31
  Administered 2016-10-31: 20 mg via INTRAVENOUS
  Administered 2016-10-31: 40 mg via INTRAVENOUS

## 2016-10-31 MED ORDER — SODIUM CHLORIDE 0.9 % IV SOLN
INTRAVENOUS | Status: DC
  Administered 2016-10-31: 11:00:00 via INTRAVENOUS

## 2016-10-31 MED ORDER — LIDOCAINE HCL (CARDIAC) 20 MG/ML IV SOLN
INTRAVENOUS | Status: DC | PRN
Start: 2016-10-31 — End: 2016-10-31
  Administered 2016-10-31: 20 mg via INTRAVENOUS

## 2016-10-31 NOTE — Anesthesia Postprocedure Evaluation (Signed)
Anesthesia Post Note  Patient: LAQUASHA GROOME  Procedure(s) Performed: Procedure(s) (LRB): CARDIOVERSION (N/A)     Patient location during evaluation: PACU Anesthesia Type: General Level of consciousness: awake and alert Pain management: pain level controlled Vital Signs Assessment: post-procedure vital signs reviewed and stable Respiratory status: spontaneous breathing, nonlabored ventilation, respiratory function stable and patient connected to nasal cannula oxygen Cardiovascular status: blood pressure returned to baseline and stable Postop Assessment: no apparent nausea or vomiting Anesthetic complications: no    Last Vitals:  Vitals:   10/31/16 1155 10/31/16 1204  BP: 135/66 (!) 141/59  Pulse: 70 71  Resp: (!) 22 16  Temp:    SpO2: 98% 96%    Last Pain:  Vitals:   10/31/16 1146  TempSrc: Oral                 Tiajuana Amass

## 2016-10-31 NOTE — Anesthesia Preprocedure Evaluation (Signed)
Anesthesia Evaluation  Patient identified by MRN, date of birth, ID band Patient awake    Reviewed: Allergy & Precautions, NPO status , Patient's Chart, lab work & pertinent test results  Airway Mallampati: II  TM Distance: >3 FB Neck ROM: Full    Dental  (+) Dental Advisory Given   Pulmonary shortness of breath,    breath sounds clear to auscultation       Cardiovascular hypertension, Pt. on medications + CAD, + Past MI and +CHF  + Valvular Problems/Murmurs AS  Rhythm:Regular Rate:Normal     Neuro/Psych TIACVA    GI/Hepatic negative GI ROS, Neg liver ROS,   Endo/Other  negative endocrine ROS  Renal/GU Renal disease     Musculoskeletal  (+) Arthritis ,   Abdominal   Peds  Hematology negative hematology ROS (+)   Anesthesia Other Findings   Reproductive/Obstetrics                             Anesthesia Physical Anesthesia Plan  ASA: III  Anesthesia Plan: General   Post-op Pain Management:    Induction: Intravenous  PONV Risk Score and Plan: 3 and Ondansetron, Propofol infusion and Treatment may vary due to age or medical condition  Airway Management Planned: Natural Airway and Mask  Additional Equipment:   Intra-op Plan:   Post-operative Plan:   Informed Consent: I have reviewed the patients History and Physical, chart, labs and discussed the procedure including the risks, benefits and alternatives for the proposed anesthesia with the patient or authorized representative who has indicated his/her understanding and acceptance.   Dental advisory given  Plan Discussed with: CRNA  Anesthesia Plan Comments:         Anesthesia Quick Evaluation

## 2016-10-31 NOTE — Interval H&P Note (Signed)
History and Physical Interval Note:  10/31/2016 11:27 AM  Jodi [name]  has presented today for surgery, with the diagnosis of AFIB  The various methods of treatment have been discussed with the patient and family. After consideration of risks, benefits and other options for treatment, the patient has consented to  Procedure(s): CARDIOVERSION (N/A) as a surgical intervention .  The patient's history has been reviewed, patient examined, no change in status, stable for surgery.  I have reviewed the patient's chart and labs.  Questions were answered to the patient's satisfaction.     [name]

## 2016-10-31 NOTE — CV Procedure (Signed)
Direct current cardioversion:  Indication symptomatic A. Fibrillation.  Procedure: Using 60 mg of IV Propofol and 20 IV Lidocaine (for reducing venous pain) for achieving deep sedation, synchronized direct current cardioversion performed. Patient was delivered with 120, 150 Joules of electricity X 2 with success to NSR. Patient tolerated the procedure well. No immediate complication noted.

## 2016-10-31 NOTE — Transfer of Care (Signed)
Immediate Anesthesia Transfer of Care Note  Patient: Jodi [name]  Procedure(s) Performed: Procedure(s): CARDIOVERSION (N/A)  Patient Location: Endoscopy Unit  Anesthesia Type:General  Level of Consciousness: awake, alert , oriented and patient cooperative  Airway & Oxygen Therapy: Patient Spontanous Breathing and Patient connected to nasal cannula oxygen  Post-op Assessment: Report given to RN and Post -op Vital signs reviewed and stable  Post vital signs: Reviewed and stable  Last Vitals:  Vitals:   10/31/16 1050  BP: 115/90  Pulse: 87  Resp: (!) 26  Temp: 36.6 C  SpO2: 98%    Last Pain:  Vitals:   10/31/16 1050  TempSrc: Oral         Complications: No apparent anesthesia complications

## 2016-11-05 ENCOUNTER — Other Ambulatory Visit: Payer: Self-pay | Admitting: Family Medicine

## 2016-11-07 DIAGNOSIS — I480 Paroxysmal atrial fibrillation: Secondary | ICD-10-CM | POA: Diagnosis not present

## 2016-11-08 ENCOUNTER — Telehealth: Payer: Self-pay

## 2016-11-08 NOTE — Telephone Encounter (Signed)
Patient called again wanting a refill on her Ambien.Jodi [name]

## 2016-11-12 ENCOUNTER — Other Ambulatory Visit: Payer: Self-pay | Admitting: Family Medicine

## 2016-11-12 DIAGNOSIS — I4891 Unspecified atrial fibrillation: Secondary | ICD-10-CM | POA: Diagnosis not present

## 2016-11-12 NOTE — Telephone Encounter (Signed)
I filled out and faxed in an order for [facility]

## 2016-11-12 NOTE — Telephone Encounter (Signed)
Pt called she really needy her Ambien, she only gets about 25 pills every 3 months or so. She is in Afib again since she is not sleeping. Please call her and let her know when this is going to be taking care of. She started requesting this medication October 1st. jw

## 2016-11-14 DIAGNOSIS — I480 Paroxysmal atrial fibrillation: Secondary | ICD-10-CM | POA: Diagnosis not present

## 2016-11-18 NOTE — H&P (Signed)
OFFICE VISIT NOTES COPIED TO EPIC FOR DOCUMENTATION  . History of Present Illness Jodi Page MD; 11/10/2016 2:49 PM) Patient words: 7-10 day f/u cardioversion; Last office visit 10/26/16.  The patient is a 83 year old female who presents for a Follow-up for Atrial fibrillation.  Additional reasons for visit:  Follow-up for Coronary artery disease is described as the following: She has history of mild aortic stenosis and moderate to moderately severe mitral regurgitation by cardiac catheterization on 11/24/2013 as echocardiogram had suggested critical aortic stenosis. Hence medical therapy only was recommended. She has paroxysmal symptomatic atrial fibrillation, hypertension, stage III chronic kidney disease, mixed hyperlipidemia and known coronary artery disease. She has had CABG in 1994, followed by coronary stents in 2002 and redo CABG in 2003.  She now presents here for follow-up of paroxysmal atrial fibrillation, she is presently on Tikosyn for maintaining sinus rhythm. She could not tolerate any of the medication due to marked bradycardia. She had been doing well until 3 weeks ago she developed upper respiratory infection and since then has noticed cough which is productive, has been treated with antibiotics, has also noticed marked fatigue. No leg edema, no PND or orthopnea. Denies any chest pain or palpitations. I had seen her on 10/26/2016 and due to recurrence of A. Fib, she underwent direct current cardioversion on 10/30/2016. She felt well for about 2-3 days, but again has started to feel tired and fatigued.   Problem List/Past Medical ([name]; 11/07/2016 2:27 PM) CAD in native artery (I25.10)  CAD/ASHD: CABG in 1994. Stents in 2002. H/O Redo CABG 06/05/01 consisting of a patent LIMA to the LAD, with free RIMA to RI, SVG to OM1, SVG to PL A, SVG to ramus intermediate branch Dr. [name]. 11/24/2013: R + L Heart Cath; Moderate pulmonary hypertension, here pressure  46/18 mmHg. Cardiac output 3.96, index 2.2. SVG to RCA ostial 90%, patent free RIMA to RI, SVG to OM1, LIMA to LAD. Aortic valve area 1.1 cm, Mean gradient of 14 mmHg. Moderate to severe mitral regurgitation, LVEF 40-45% with basal to midinferior akinesis. Lexiscan stress 12/15/10: Inferior, septal and distal anterior and apical scar. No ischemia. EF 47%. Benign essential HTN (I10)  Postsurgical aortocoronary bypass status (Z95.1)  EKG 03/26/2012 NSR @ 65/min. LAE, LVH with repolarizatiuoun abnormality. Ventricular couplets. Mixed hyperlipidemia (E78.2)  Bilateral carotid bruits (R09.89)  Carotid duplex 02/28/12: No evidence of hemodynamically significant stenosis in the bilateral carotid bifurcation vessels. There is evidence of homogeneous plaque in the bilateral carotid artery. Left ICA demonstrates mild tortuosity. Carotid duplex 01/01/11: Mild mixed plaque bilateral carotid. Simple cysts thyroid gland bilateral. Chronic renal insufficiency, stage III (moderate) (N18.3)  Chronic diastolic congestive heart failure (I50.32)  Chronic migraine without aura without status migrainosus, not intractable (G43.709)  OV Note Dr [name] 11/18/2013: Headache probably suggest tension headache, some migraine features. She'll be followed up. Mitral regurgitation and aortic stenosis (I08.0)  left + right Heart catheterization 11/24/2013: Moderate pulmonary hypertension, here pressure 46/18 mmHg. Cardiac output 3.96, can't take index 2.2. SVG to RCA ostial 90%, patent free RIMA to RI, SVG to OM1, LIMA to LAD. Aortic valve area 1.1 cm, Mean gradient of 14 mmHg. Moderate to severe mitral regurgitation, LVEF 40-45% with basal to midinferior akinesis. Sick sinus syndrome due to SA node dysfunction (I49.5)  Shortness of breath on exertion (R06.02)  Therapeutic drug monitoring (Z51.81)  Patient on Tikosyn Labwork  10/27/2016: Glucose 106, creatinine 1.2, EGFR 41/48, potassium 4.6, BMP otherwise normal. Magnesium  2.3.  Labs 10/02/2016: A1c 6.1%. Labs 08/25/2016: Serum glucose 135 mg, BUN 22, creatinine 1.11, potassium 4.4, eGFR 45 mL. HB 0.7/HCT 38.3, platelets 210. Normal indicis. Labs 04/20/2016: HB 13.6/HCT 40.1, normal indicis. Serum glucose 109 mg, BUN 18, creatinine 1.07, eGFR 49 mL, potassium 4.7. CMP otherwise normal. Total cholesterol 168, triglycerides 122, HDL 51, LDL 73. TSH normal. Magnesium 2.2. Labs 10/24/2015: Serum glucose 198 g, BUN 19, serum creatinine 1.14, eGFR 45 mL. Potassium 4.2. CBC normal, magnesium 2.2 normal. Hyperglycemia (R73.9)  Paroxysmal atrial fibrillation (I48.0) [08/05/2013]: CHA2DS2-VASc Score is 5 with yearly risk of stroke of 6.7%. HAS-Bled score is 2 and estimated major bleeding in one year is 1.88-3.2% Cardioversion 05/18/2014: Atrial flutter to junctional escape rhythm at the rate of 40 bpm. Cardioversion 10/20/2013: A. Fib to NSR. History of cardioversion (F81.829) [10/20/2013]: Cardioversion 05/18/2014: Atrial flutter to junctional escape rhythm at the rate of 40 bpm on Amiodarone. Cardioversion 10/20/2013: A. Fib to NSR. Aortic stenosis, mild (I35.0) [11/24/2013]:  Allergies ([name]; 12-04-16 2:27 PM) Sulfa Drugs  Rash.  Family History ([name]; 12/04/2016 2:27 PM) Mother  Deceased. at age 73, from Natural Causes. Known Heart Valve Replacement at age 100. Father  Deceased. at age 74 from Leukemia. Sister 3  Older; 2-Deceased Brother 3  2022-03-15 Yrs Younger-Deceased; 2-Older 1-Deceased from an MI in his late 92's  Social History ([name]; Dec 04, 2016 2:27 PM) Current tobacco use  Never smoker. Non Drinker/No Alcohol Use  Marital status  Widowed. Number of Children  7. Living Situation  Lives alone. at [facility]  Past Surgical History ([name]; Dec 04, 2016 2:27 PM) Percutaneous needle biopsy of renal lesion (93716) [12/19/2011]: No renal carcinoma. Done for left renal mass. Dr. [name] CABG in 1994,  Had a MI in 2004  Stenting of veing grafts in 2002 and restenosis and occlusion of vein grafts led to Redo C ABG 06/05/01 consisting of a patent LIMA to the LAD, with free RIMA to RI, SVG to OM1, SVG to PL A, SVG to ramus intermediate branch Dr. [name].  Hysterectomy 1990.   Medication History Frances Furbish Wynetta Emery; 2016-12-04 2:49 PM) Furosemide (20MG Tablet, 1 Table Tablet Tablet Oral daily as needed only for leg edema, Taken starting 10/09/2016) Active. AmLODIPine Besylate (10MG Tablet, 1 (one) Tablet Tablet Oral daily, Taken starting 07/09/2016) Active. Dofetilide (125MCG Capsule, 1 (one) Capsul Capsule Cap Oral two times daily, Taken starting 07/09/2016) Active. Eliquis (5MG Tablet, 1 Tablet Tablet Oral two times daily, Taken starting 07/03/2016) Active. Spironolactone (25MG Tablet, 1 Oral daily, Taken starting 12/03/2013) Active. Zolpidem Tartrate (5MG Tablet, Oral at bedtime, as needed) Active. Multiple Vitamin (1 (one) Oral daily) Active. Vitamin D3 (1000UNIT Tablet, 1 Oral daily) Active. Biotin (5000MCG Tablet, 1 Oral daily) Active. Folic Acid (967ELF Tablet, 1 Oral daily) Active. Vitamin B12 (1 Oral daily) Specific strength unknown - Active. Cranberry Concentrate (1 Oral daily) Specific strength unknown - Active. Vitamin B-6 (100MG Tablet, 1 Oral daily) Active. Rosuvastatin Calcium (20MG Tablet, 1 Oral daily) Active. PreserVision AREDS 2 (2 Oral daily) Active. Beta Carotene (25000UNIT Capsule, 1 Oral daily) Active. Turmeric Curcumin (500MG Capsule, 1 Oral daily) Active. ProAir HFA (108 (90 Base)MCG/ACT Aerosol Soln, Inhalation prn) Active. Medications Reconciled (verbally)  Diagnostic Studies History (April Garrison; 2016/12/04 2:38 PM) Cardioversion [10/31/2016]:  Other Problems ([name]; Dec 04, 2016 2:27 PM) Had a slight stroke during CABG in 2003  Dizziness (R42)     Review of Systems Jodi Page, MD; 11/10/2016 2:51 PM) General Present-  Fatigue. Not Present- Fever  and Night Sweats. Respiratory Present- Cough (productive) and Difficulty Breathing on Exertion (worse last 2 weeks). Not Present- Bloody sputum and Wakes up from Sleep Wheezing or Short of Breath. Cardiovascular Not Present- Chest Pain, Claudications, Edema, Leg Cramps, Orthopnea and Paroxysmal Nocturnal Dyspnea. Gastrointestinal Not Present- Black, Tarry Stool, Bloody Stool and Heartburn. Musculoskeletal Present- Joint Pain (chronic). Not Present- Claudication. Neurological Not Present- Decreased Memory, Dizziness, Fainting, Focal Neurological Symptoms, Syncope and Visual Changes. Endocrine Not Present- Heat Intolerance, Polydipsia and Polyuria. Hematology Not Present- Easy Bleeding, Easy Bruising, Nose Bleed and Petechiae. All other systems negative  Vitals Frances Furbish Johnson; 11/07/2016 2:54 PM) 11/07/2016 2:45 PM Weight: 158.56 lb Height: 64in Body Surface Area: 1.77 m Body Mass Index: 27.22 kg/m  Pulse: 66 (Regular)  P.OX: 95% (Room air) BP: 167/75 (Sitting, Left Arm, Standard)       Physical Exam Jodi Page MD; 11/10/2016 2:49 PM) General Mental Status-Alert. General Appearance-Cooperative, Appears younger than stated age, Not in acute distress. Orientation-Oriented X3. Build & Nutrition-Well built.  Head and Neck Thyroid Gland Characteristics - no palpable nodules, no palpable enlargement.  Chest and Lung Exam Chest and lung exam reveals -quiet, even and easy respiratory effort with no use of accessory muscles and on auscultation, normal breath sounds, no adventitious sounds. Auscultation  Cardiovascular Inspection Jugular vein - Right - No Distention. Auscultation Rhythm - Irregular. Heart Sounds - S1 is variable, S2 WNL and No gallop present. Murmurs & Other Heart Sounds: Murmur - Location - Apex and Sternal Border - Right. Timing - Early systolic. Grade - III/VI. Radiation -  Carotids.  Abdomen Palpation/Percussion Normal exam - Non Tender and No hepatosplenomegaly. Auscultation Normal exam - Bowel sounds normal.  Peripheral Vascular Lower Extremity Inspection - Bilateral - Varicose veins. Palpation - Edema - Bilateral - No edema. Femoral pulse - Bilateral - Normal. Popliteal pulse - Bilateral - Normal. Dorsalis pedis pulse - Bilateral - Normal. Posterior tibial pulse - Bilateral - Normal. Carotid arteries - Bilateral-Soft Bruit. Abdomen-No prominent abdominal aortic pulsation, No epigastric bruit.  Neurologic Motor-Grossly intact without any focal deficits.  Musculoskeletal Global Assessment Left Lower Extremity - normal range of motion without pain. Right Lower Extremity - normal range of motion without pain.    Assessment & Plan Jodi Page MD; 11/10/2016 2:51 PM) CAD in native artery (I25.10) Story: CAD/ASHD: CABG in 1994. Stents in 2002. H/O Redo CABG 06/05/01 consisting of a patent LIMA to the LAD, with free RIMA to RI, SVG to OM1, SVG to PL A, SVG to ramus intermediate branch Dr. [name].  11/24/2013: R + L Heart Cath; Moderate pulmonary hypertension, here pressure 46/18 mmHg. Cardiac output 3.96, index 2.2. SVG to RCA ostial 90%, patent free RIMA to RI, SVG to OM1, LIMA to LAD. Aortic valve area 1.1 cm, Mean gradient of 14 mmHg. Moderate to severe mitral regurgitation, LVEF 40-45% with basal to midinferior akinesis.  Lexiscan stress 12/15/10: Inferior, septal and distal anterior and apical scar. No ischemia. EF 47%. Paroxysmal atrial fibrillation (I48.0) Story: CHA2DS2-VASc Score is 5 with yearly risk of stroke of 6.7%. HAS-Bled score is 2 and estimated major bleeding in one year is 1.88-3.2%   Cardioversion 10/30/16, 05/18/2014: 05/18/2014: Atrial flutter to junctional escape rhythm at the rate of 40 bpm. Cardioversion 10/20/2013: A. Fib to NSR. Impression: EKG 10/26/2016: Atrial fibrillation with controlled ventricular response at the  rate of 79 bpm, normal axis. No evidence of ischemia. Normal QT interval. Compared to EKG 04/20/2016: Normal sinus rhythm at rate of 59  bpm, normal axis, poor R-wave progression, cannot exclude anterior infarct old. LVH. Normal QT interval. No significant change from EKG 10/31/2015. Current Plans Complete electrocardiogram (93000) Therapeutic drug monitoring (Z51.81) Story: Patient on Tikosyn Mitral regurgitation and aortic stenosis (I08.0) Story: left + right Heart catheterization 11/24/2013: Moderate pulmonary hypertension, here pressure 46/18 mmHg. Cardiac output 3.96, can't take index 2.2. SVG to RCA ostial 90%, patent free RIMA to RI, SVG to OM1, LIMA to LAD. Aortic valve area 1.1 cm, Mean gradient of 14 mmHg. Moderate to severe mitral regurgitation, LVEF 40-45% with basal to midinferior akinesis. Future Plans 12/06/2016: Echocardiography, transthoracic, real-time with image documentation (2D), includes M-mode recording, when performed, complete, with spectral Doppler echocardiography, and with color flow Doppler echocardiography (09311) - one time Labwork Story: 10/27/2016: Glucose 106, creatinine 1.2, EGFR 41/48, potassium 4.6, BMP otherwise normal. Magnesium 2.3.  Labs 10/02/2016: A1c 6.1%.  Labs 08/25/2016: Serum glucose 135 mg, BUN 22, creatinine 1.11, potassium 4.4, eGFR 45 mL. HB 0.7/HCT 38.3, platelets 210. Normal indicis.  Labs 04/20/2016: HB 13.6/HCT 40.1, normal indicis. Serum glucose 109 mg, BUN 18, creatinine 1.07, eGFR 49 mL, potassium 4.7. CMP otherwise normal. Total cholesterol 168, triglycerides 122, HDL 51, LDL 73. TSH normal. Magnesium 2.2.  Labs 10/24/2015: Serum glucose 198 g, BUN 19, serum creatinine 1.14, eGFR 45 mL. Potassium 4.2. CBC normal, magnesium 2.2 normal.  Note:-  Recommendations:  Patient underwent direct current cardioversion 2 weeks ago, at unfortunately she is back in atrial fibrillation. She is extremely symptomatic, after cardioversion, she had felt  energetic, had been traveling and also going to discharge and doing church activities without limitations. However or the past 3-4 days, she again started noticed marked fatigue and she is now back in atrial fibrillation. Not much to offer from therapeutic standpoint, ablation would be a consideration. I will refer her to be evaluated for the same to EP. Continue Phyllis Ginger for now. Cannnot tolerate any other medications due to marked bradycardia.  Will repeat echocardiogram as it has been greater than 2 years since last echo. Do not suspect progression of coronary artery disease. She does have high-grade stenosis history to ostial RCA however the right was small and medical therapy was recommended. The could certainly consider repeating stress test. Office visit in 3 months. There is no clinical evidence of heart failure.  CC Dr. Margaretmary Eddy. Ref: EP evaluation for A. Fib    Signed by Jodi Page, MD (11/10/2016 2:52 PM)  Addendum: Patient called [place] on 11/15/2016 and requested that we schedule her for cardioversion due to marked fatigue and dyspnea. She is aware that it may not last long.

## 2016-11-20 ENCOUNTER — Ambulatory Visit (HOSPITAL_COMMUNITY)
Admission: RE | Admit: 2016-11-20 | Discharge: 2016-11-20 | Disposition: A | Discharge status: Home / Self Care | Payer: Medicare Other | Source: Ambulatory Visit | Attending: Cardiology | Admitting: Cardiology

## 2016-11-20 ENCOUNTER — Ambulatory Visit (HOSPITAL_COMMUNITY): Payer: Medicare Other | Admitting: Certified Registered Nurse Anesthetist

## 2016-11-20 ENCOUNTER — Encounter (HOSPITAL_COMMUNITY)
Admission: RE | Disposition: A | Discharge status: Home / Self Care | Payer: Self-pay | Source: Ambulatory Visit | Attending: Cardiology

## 2016-11-20 ENCOUNTER — Encounter (HOSPITAL_COMMUNITY): Payer: Self-pay | Admitting: *Deleted

## 2016-11-20 DIAGNOSIS — Z7901 Long term (current) use of anticoagulants: Secondary | ICD-10-CM | POA: Diagnosis not present

## 2016-11-20 DIAGNOSIS — I2510 Atherosclerotic heart disease of native coronary artery without angina pectoris: Secondary | ICD-10-CM | POA: Diagnosis not present

## 2016-11-20 DIAGNOSIS — Z79899 Other long term (current) drug therapy: Secondary | ICD-10-CM | POA: Insufficient documentation

## 2016-11-20 DIAGNOSIS — I495 Sick sinus syndrome: Secondary | ICD-10-CM | POA: Insufficient documentation

## 2016-11-20 DIAGNOSIS — I080 Rheumatic disorders of both mitral and aortic valves: Secondary | ICD-10-CM | POA: Diagnosis not present

## 2016-11-20 DIAGNOSIS — N183 Chronic kidney disease, stage 3 (moderate): Secondary | ICD-10-CM | POA: Insufficient documentation

## 2016-11-20 DIAGNOSIS — Z951 Presence of aortocoronary bypass graft: Secondary | ICD-10-CM | POA: Diagnosis not present

## 2016-11-20 DIAGNOSIS — I480 Paroxysmal atrial fibrillation: Secondary | ICD-10-CM | POA: Diagnosis not present

## 2016-11-20 DIAGNOSIS — I130 Hypertensive heart and chronic kidney disease with heart failure and stage 1 through stage 4 chronic kidney disease, or unspecified chronic kidney disease: Secondary | ICD-10-CM | POA: Diagnosis not present

## 2016-11-20 DIAGNOSIS — I5032 Chronic diastolic (congestive) heart failure: Secondary | ICD-10-CM | POA: Diagnosis not present

## 2016-11-20 DIAGNOSIS — I272 Pulmonary hypertension, unspecified: Secondary | ICD-10-CM | POA: Insufficient documentation

## 2016-11-20 DIAGNOSIS — Z955 Presence of coronary angioplasty implant and graft: Secondary | ICD-10-CM | POA: Insufficient documentation

## 2016-11-20 DIAGNOSIS — E782 Mixed hyperlipidemia: Secondary | ICD-10-CM | POA: Diagnosis not present

## 2016-11-20 HISTORY — PX: CARDIOVERSION: SHX1299

## 2016-11-20 SURGERY — CARDIOVERSION
Anesthesia: General

## 2016-11-20 MED ORDER — PROPOFOL 10 MG/ML IV BOLUS
INTRAVENOUS | Status: DC | PRN
  Administered 2016-11-20: 50 mg via INTRAVENOUS

## 2016-11-20 MED ORDER — LIDOCAINE HCL (CARDIAC) 20 MG/ML IV SOLN
INTRAVENOUS | Status: DC | PRN
  Administered 2016-11-20: 20 mg via INTRAVENOUS

## 2016-11-20 MED ORDER — SODIUM CHLORIDE 0.9 % IV SOLN
INTRAVENOUS | Status: DC
  Administered 2016-11-20: 13:00:00 via INTRAVENOUS

## 2016-11-20 NOTE — Anesthesia Procedure Notes (Signed)
Performed by: Oletta Lamas

## 2016-11-20 NOTE — Transfer of Care (Signed)
Immediate Anesthesia Transfer of Care Note  Patient: Jodi [name]  Procedure(s) Performed: CARDIOVERSION (N/A )  Patient Location: Endoscopy Unit  Anesthesia Type:General  Level of Consciousness: awake, alert , oriented and patient cooperative  Airway & Oxygen Therapy: Patient Spontanous Breathing and Patient connected to nasal cannula oxygen  Post-op Assessment: Report given to RN and Post -op Vital signs reviewed and stable  Post vital signs: Reviewed and stable  Last Vitals:  Vitals:   11/20/16 1237  BP: (!) 189/92  Pulse: 88  Resp: 19  Temp: 36.4 C  SpO2: 99%    Last Pain:  Vitals:   11/20/16 1237  TempSrc: Oral         Complications: No apparent anesthesia complications

## 2016-11-20 NOTE — Interval H&P Note (Signed)
History and Physical Interval Note:  11/20/2016 12:58 PM  Jodi [name]  has presented today for surgery, with the diagnosis of afib  The various methods of treatment have been discussed with the patient and family. After consideration of risks, benefits and other options for treatment, the patient has consented to  Procedure(s): CARDIOVERSION (N/A) as a surgical intervention .  The patient's history has been reviewed, patient examined, no change in status, stable for surgery.  I have reviewed the patient's chart and labs.  Questions were answered to the patient's satisfaction.     [name]

## 2016-11-20 NOTE — Anesthesia Postprocedure Evaluation (Signed)
Anesthesia Post Note  Patient: CHRISELDA LEPPERT  Procedure(s) Performed: CARDIOVERSION (N/A )     Patient location during evaluation: PACU Anesthesia Type: General Level of consciousness: awake and alert Pain management: pain level controlled Vital Signs Assessment: post-procedure vital signs reviewed and stable Respiratory status: spontaneous breathing, nonlabored ventilation, respiratory function stable and patient connected to nasal cannula oxygen Cardiovascular status: blood pressure returned to baseline and stable Postop Assessment: no apparent nausea or vomiting Anesthetic complications: no    Last Vitals:  Vitals:   11/20/16 1330 11/20/16 1340  BP: (!) 163/84 (!) 164/77  Pulse: 72 74  Resp: 18 16  Temp:    SpO2: 97% 97%    Last Pain:  Vitals:   11/20/16 1322  TempSrc: Oral                 Tiajuana Amass

## 2016-11-20 NOTE — Anesthesia Preprocedure Evaluation (Signed)
Anesthesia Evaluation  Patient identified by MRN, date of birth, ID band Patient awake    Reviewed: Allergy & Precautions, NPO status , Patient's Chart, lab work & pertinent test results  Airway Mallampati: II  TM Distance: >3 FB Neck ROM: Full    Dental   Pulmonary shortness of breath,    Pulmonary exam normal        Cardiovascular hypertension, Pt. on medications + CAD, + CABG and +CHF   Rhythm:Irregular Rate:Normal     Neuro/Psych TIACVA    GI/Hepatic negative GI ROS, Neg liver ROS,   Endo/Other  negative endocrine ROS  Renal/GU Renal diseasenegative Renal ROS     Musculoskeletal  (+) Arthritis ,   Abdominal   Peds  Hematology negative hematology ROS (+)   Anesthesia Other Findings   Reproductive/Obstetrics                             Anesthesia Physical Anesthesia Plan  ASA: II  Anesthesia Plan: General   Post-op Pain Management:    Induction: Intravenous  PONV Risk Score and Plan: Propofol infusion, Treatment may vary due to age or medical condition and Ondansetron  Airway Management Planned: Mask and Natural Airway  Additional Equipment:   Intra-op Plan:   Post-operative Plan:   Informed Consent: I have reviewed the patients History and Physical, chart, labs and discussed the procedure including the risks, benefits and alternatives for the proposed anesthesia with the patient or authorized representative who has indicated his/her understanding and acceptance.     Plan Discussed with: CRNA  Anesthesia Plan Comments:         Anesthesia Quick Evaluation

## 2016-11-20 NOTE — CV Procedure (Signed)
Direct current cardioversion:  Indication symptomatic A. Fibrillation.  Procedure: Using 40 mg of IV Propofol and 20 IV Lidocaine (for reducing venous pain) for achieving deep sedation, synchronized direct current cardioversion performed. Patient was delivered with 120 Joules of electricity X 1 with success to NSR. Patient tolerated the procedure well. No immediate complication noted.

## 2016-11-20 NOTE — Discharge Instructions (Signed)
Electrical Cardioversion, Care After °This sheet gives you information about how to care for yourself after your procedure. Your health care provider may also give you more specific instructions. If you have problems or questions, contact your health care provider. °What can I expect after the procedure? °After the procedure, it is common to have: °· Some redness on the skin where the shocks were given. ° °Follow these instructions at home: °· Do not drive for 24 hours if you were given a medicine to help you relax (sedative). °· Take over-the-counter and prescription medicines only as told by your health care provider. °· Ask your health care provider how to check your pulse. Check it often. °· Rest for 48 hours after the procedure or as told by your health care provider. °· Avoid or limit your caffeine use as told by your health care provider. °Contact a health care provider if: °· You feel like your heart is beating too quickly or your pulse is not regular. °· You have a serious muscle cramp that does not go away. °Get help right away if: °· You have discomfort in your chest. °· You are dizzy or you feel faint. °· You have trouble breathing or you are short of breath. °· Your speech is slurred. °· You have trouble moving an arm or leg on one side of your body. °· Your fingers or toes turn cold or blue. °This information is not intended to replace advice given to you by your health care provider. Make sure you discuss any questions you have with your health care provider. °Document Released: 11/12/2012 Document Revised: 08/26/2015 Document Reviewed: 07/29/2015 °Elsevier Interactive Patient Education © 2018 [employer] ° °

## 2016-11-26 ENCOUNTER — Encounter: Payer: Self-pay | Admitting: Internal Medicine

## 2016-11-26 ENCOUNTER — Ambulatory Visit (INDEPENDENT_AMBULATORY_CARE_PROVIDER_SITE_OTHER): Payer: Medicare Other | Admitting: Internal Medicine

## 2016-11-26 VITALS — BP 140/64 | HR 75 | Ht 63.5 in | Wt 158.4 lb

## 2016-11-26 DIAGNOSIS — I481 Persistent atrial fibrillation: Secondary | ICD-10-CM | POA: Diagnosis not present

## 2016-11-26 DIAGNOSIS — I4819 Other persistent atrial fibrillation: Secondary | ICD-10-CM

## 2016-11-26 DIAGNOSIS — I480 Paroxysmal atrial fibrillation: Secondary | ICD-10-CM | POA: Diagnosis not present

## 2016-11-26 MED ORDER — DILTIAZEM HCL 30 MG PO TABS: ORAL_TABLET | ORAL | 1 refills | Status: DC

## 2016-11-26 NOTE — Progress Notes (Signed)
Electrophysiology Office Note   Date:  11/26/2016   ID:  Jodi [name], DOB 26-Feb-1932, MRN [id number]  PCP:  [name], MD  Cardiologist:  Dr Einar Gip Primary Electrophysiologist: Thompson Grayer, MD    Chief Complaint  Patient presents with  . Atrial Fibrillation     History of Present Illness: Jodi [name] is a 83 y.o. female who presents today for electrophysiology evaluation.   The patient is referred by Dr Einar Gip for EP consultation regarding persistent atrial fibrillation.  She also has advanced valvular heart disease.  Her afib is persistent.  Heart rates are well controlled.   She has fatigue and chronic SOB.  She had aortic stenosis as well as moderate to severe MR.  She has teakn [place] with some improvement previously. She also has bradycardia when in sinus rhythm.  She has fatigue in sinus rhythm also. Today, she denies symptoms of palpitations, chest pain, shortness of breath, orthopnea, PND, lower extremity edema, claudication, dizziness, presyncope, syncope, bleeding, or neurologic sequela. The patient is tolerating medications without difficulties and is otherwise without complaint today.    Past Medical History:  Diagnosis Date  . Aortic stenosis   . Arthritis    "maybe in my fingers and toes" (09/28/2014)  . Bradycardia   . CHF (congestive heart failure) ([place])   . Chronic renal insufficiency, stage III (moderate) ([place])    [name] 09/27/2014  . Coronary artery disease   . Heart murmur   . Hyperlipidemia   . Hypertension   . Macular degeneration, left eye   . Myocardial infarction [facility]) 1995; 2003  . Paroxysmal atrial fibrillation (HCC)   . Renal cell carcinoma ([place]) 09/23/2012  . Renal mass 12/06/2010   CT Abdomen 11-27-10 upper pole region of the right kidney which is suspicious for solid lesion, measuring 1.9 x 1.3 cm. This lesion is concerning for renal cell carcinoma given the solid appearance.  Following with Dr [name].  Renal bx was  benign    . Shortness of breath   . Splenic infarct 12/06/2010  . Stroke [facility]) 2003   "when I had heart surgery"; denies residual on 09/28/2014  . TIA (transient ischemic attack) "several"   Past Surgical History:  Procedure Laterality Date  . CARDIAC CATHETERIZATION    . CARDIOVERSION N/A 10/20/2013   Procedure: CARDIOVERSION;  Surgeon: Laverda Page, MD;  Location: [place];  Service: Cardiovascular;  Laterality: N/A;  H&P in file  . CARDIOVERSION N/A 05/18/2014   Procedure: CARDIOVERSION;  Surgeon: [name], MD;  Location: [facility] ENDOSCOPY;  Service: Cardiovascular;  Laterality: N/A;  . CARDIOVERSION N/A 10/31/2016   Procedure: CARDIOVERSION;  Surgeon: [name], MD;  Location: [facility] ENDOSCOPY;  Service: Cardiovascular;  Laterality: N/A;  . CARDIOVERSION N/A 11/20/2016   Procedure: CARDIOVERSION;  Surgeon: [name], MD;  Location: [place];  Service: Cardiovascular;  Laterality: N/A;  . CATARACT EXTRACTION W/ INTRAOCULAR LENS  IMPLANT, BILATERAL Bilateral ~ 2013  . CORONARY ANGIOPLASTY    . CORONARY ARTERY BYPASS GRAFT  1995 and 2003  . IR RADIOLOGIST EVAL & MGMT  07/03/2016  . LEFT AND RIGHT HEART CATHETERIZATION WITH CORONARY ANGIOGRAM N/A 11/24/2013   Procedure: LEFT AND RIGHT HEART CATHETERIZATION WITH CORONARY ANGIOGRAM;  Surgeon: Laverda Page, MD;  Location: [facility] CATH LAB;  Service: Cardiovascular;  Laterality: N/A;  . PERCUTANEOUS NEEDLE BIOPSY OF RENAL LESION  ?2014  . TOTAL ABDOMINAL HYSTERECTOMY  1990's   "both ovaries were full of little tiny sores"  . TYMPANOPLASTY  Bilateral    "had holes in them; still have holes in them"     Current Outpatient Prescriptions  Medication Sig Dispense Refill  . albuterol (PROVENTIL HFA;VENTOLIN HFA) 108 (90 Base) MCG/ACT inhaler Inhale 1 puff into the lungs every 6 (six) hours as needed for wheezing or shortness of breath. 1 Inhaler 0  . amLODipine (NORVASC) 10 MG tablet Take 10 mg by mouth daily.     Marland Kitchen apixaban (ELIQUIS) 5 MG  TABS tablet Take 1 tablet (5 mg total) by mouth 2 (two) times daily. 60 tablet 1  . beta carotene w/minerals (OCUVITE) tablet Take 1 tablet by mouth daily.    . Cholecalciferol (VITAMIN D3) 2000 UNITS TABS Take 2,000 Units by mouth daily at 12 noon.     Marland Kitchen CRANBERRY PO Take 2 capsules by mouth daily with lunch.    . dofetilide (TIKOSYN) 125 MCG capsule Take 1 capsule (125 mcg total) by mouth 2 (two) times daily. 14 capsule 0  . furosemide (LASIX) 20 MG tablet Take 1 tablet (20 mg total) by mouth daily. Daily until your hospital follow up. 30 tablet 0  . Multiple Vitamin (MULTIVITAMIN WITH MINERALS) TABS tablet Take 1 tablet by mouth daily.    . rosuvastatin (CRESTOR) 20 MG tablet TAKE 1 TABLET BY MOUTH DAILY 90 tablet 3  . spironolactone (ALDACTONE) 25 MG tablet TAKE 1 TABLET BY MOUTH EVERY DAY. 90 tablet 1  . zolpidem (AMBIEN) 5 MG tablet TAKE 1 TABLET BY MOUTH AT BEDTIME AS NEEDED FOR SLEEP. 25 tablet 4   No current facility-administered medications for this visit.     Allergies:   Sulfamethoxazole-trimethoprim and Tape   Social History:  The patient  reports that she has never smoked. She has never used smokeless tobacco. She reports that she does not drink alcohol or use drugs.   Family History:  The patient's  family history includes Cancer in her brother; Coronary artery disease in her brother and mother; Heart attack in her brother, daughter, and son; Leukemia in her father and sister; Myelodysplastic syndrome in her sister; Stroke in her mother.    ROS:  Please see the history of present illness.   All other systems are personally reviewed and negative.    PHYSICAL EXAM: VS:  BP 140/64   Pulse 75   Ht 5' 3.5" (1.613 m)   Wt 158 lb 6.4 oz (71.8 kg)   SpO2 94%   BMI 27.62 kg/m  , BMI Body mass index is 27.62 kg/m. GEN: elderly, in no acute distress  HEENT: normal  Neck: no JVD, carotid bruits, or masses Cardiac: iRRR, 2/6 SEM LUSB which is late peaking, 3/6 blowing  holosystolic murmur at the apex Respiratory:  clear to auscultation bilaterally, normal work of breathing GI: soft, nontender, nondistended, + BS MS: no deformity or atrophy  Skin: warm and dry  Neuro:  Strength and sensation are intact Psych: euthymic mood, full affect  EKG:  EKG is ordered today. The ekg ordered today is personally reviewed and shows afib, V rate 75 bpm, LVH   Recent Labs: 08/25/2016: ALT 24; BUN 22; Creatinine, Ser 1.11; Hemoglobin 12.7; Platelets 210; Potassium 4.4; Sodium 138  personally reviewed   Lipid Panel     Component Value Date/Time   CHOL 176 11/09/2015 1127   TRIG 169 (H) 11/09/2015 1127   HDL 53 11/09/2015 1127   CHOLHDL 3.3 11/09/2015 1127   VLDL 34 (H) 11/09/2015 1127   LDLCALC 89 11/09/2015 1127   LDLDIRECT 79  10/22/2011 1448   personally reviewed   Wt Readings from Last 3 Encounters:  11/26/16 158 lb 6.4 oz (71.8 kg)  10/31/16 156 lb (70.8 kg)  10/18/16 158 lb (71.7 kg)      Other studies personally reviewed: Additional studies/ records that were reviewed today include: records from Dr Irven Shelling office  Review of the above records today demonstrates: as above   ASSESSMENT AND PLAN:  1.  Persistent atrial fibrillation The patient has valvular atrial fibrillation.  In the setting of severe MR, we really do not have options for her afib.  Ablation is not expected to be successful. She is not interested in valve surgery.  Given her advanced age, she would prefer a conservative approach. As her afib has not responded to [place], we will stop tikosyn at this time.  She is well rate controlled and therefore would not benefit from an AV nodal ablation.  She is given cardizem 30mg  tablets which she can use prn. She is clear that she prefers a conservative rate control approach and is not interested in ablation.    Follow-up with Dr Einar Gip as scheduled I will see as needed going forward  Current medicines are reviewed at length with the patient  today.   The patient does not have concerns regarding her medicines.  The following changes were made today:  none  Labs/ tests ordered today include:  Orders Placed This Encounter  Procedures  . EKG 12-Lead     Signed, Thompson Grayer, MD  11/26/2016 8:55 AM     [facility] [address]  [place] [address] [phone] (office) [phone] (fax)

## 2016-11-26 NOTE — Patient Instructions (Addendum)
Medication Instructions:  Your physician has recommended you make the following change in your medication:   1.)  Stop Tikosyn  2.) Cardizem ( Diltiazem) 30mg  every 4 hours as needed for palpitations   -- If you need a refill on your cardiac medications before your next appointment, please call your pharmacy. --  Labwork: None ordered  Testing/Procedures: None ordered  Follow-Up: Your physician wants you to follow-up as needed with Dr. Rayann Heman.     Thank you for choosing [facility]!!   [phone]  Any Other Special Instructions Will Be Listed Below (If Applicable).

## 2016-11-27 DIAGNOSIS — H353124 Nonexudative age-related macular degeneration, left eye, advanced atrophic with subfoveal involvement: Secondary | ICD-10-CM | POA: Diagnosis not present

## 2016-11-27 DIAGNOSIS — H35371 Puckering of macula, right eye: Secondary | ICD-10-CM | POA: Diagnosis not present

## 2016-11-27 DIAGNOSIS — H353222 Exudative age-related macular degeneration, left eye, with inactive choroidal neovascularization: Secondary | ICD-10-CM | POA: Diagnosis not present

## 2016-11-27 DIAGNOSIS — H35372 Puckering of macula, left eye: Secondary | ICD-10-CM | POA: Diagnosis not present

## 2016-11-27 DIAGNOSIS — H353112 Nonexudative age-related macular degeneration, right eye, intermediate dry stage: Secondary | ICD-10-CM | POA: Diagnosis not present

## 2016-11-28 DIAGNOSIS — I2510 Atherosclerotic heart disease of native coronary artery without angina pectoris: Secondary | ICD-10-CM | POA: Diagnosis not present

## 2016-11-28 DIAGNOSIS — Z5181 Encounter for therapeutic drug level monitoring: Secondary | ICD-10-CM | POA: Diagnosis not present

## 2016-11-28 DIAGNOSIS — I4891 Unspecified atrial fibrillation: Secondary | ICD-10-CM | POA: Diagnosis not present

## 2016-11-28 DIAGNOSIS — I080 Rheumatic disorders of both mitral and aortic valves: Secondary | ICD-10-CM | POA: Diagnosis not present

## 2016-12-04 ENCOUNTER — Inpatient Hospital Stay (HOSPITAL_COMMUNITY)
Admission: EM | Admit: 2016-12-04 | Discharge: 2016-12-06 | DRG: 291 | Disposition: A | Discharge status: Home / Self Care | Payer: Medicare Other | Attending: Cardiology | Admitting: Cardiology

## 2016-12-04 ENCOUNTER — Telehealth: Payer: Self-pay | Admitting: Family Medicine

## 2016-12-04 ENCOUNTER — Emergency Department (HOSPITAL_COMMUNITY): Payer: Medicare Other

## 2016-12-04 ENCOUNTER — Encounter (HOSPITAL_COMMUNITY): Payer: Self-pay | Admitting: Emergency Medicine

## 2016-12-04 DIAGNOSIS — Z8673 Personal history of transient ischemic attack (TIA), and cerebral infarction without residual deficits: Secondary | ICD-10-CM

## 2016-12-04 DIAGNOSIS — I5043 Acute on chronic combined systolic (congestive) and diastolic (congestive) heart failure: Secondary | ICD-10-CM | POA: Diagnosis present

## 2016-12-04 DIAGNOSIS — Z8249 Family history of ischemic heart disease and other diseases of the circulatory system: Secondary | ICD-10-CM | POA: Diagnosis not present

## 2016-12-04 DIAGNOSIS — I130 Hypertensive heart and chronic kidney disease with heart failure and stage 1 through stage 4 chronic kidney disease, or unspecified chronic kidney disease: Principal | ICD-10-CM | POA: Diagnosis present

## 2016-12-04 DIAGNOSIS — Z951 Presence of aortocoronary bypass graft: Secondary | ICD-10-CM

## 2016-12-04 DIAGNOSIS — N183 Chronic kidney disease, stage 3 (moderate): Secondary | ICD-10-CM | POA: Diagnosis present

## 2016-12-04 DIAGNOSIS — I252 Old myocardial infarction: Secondary | ICD-10-CM

## 2016-12-04 DIAGNOSIS — R069 Unspecified abnormalities of breathing: Secondary | ICD-10-CM | POA: Diagnosis not present

## 2016-12-04 DIAGNOSIS — I2510 Atherosclerotic heart disease of native coronary artery without angina pectoris: Secondary | ICD-10-CM | POA: Diagnosis present

## 2016-12-04 DIAGNOSIS — I350 Nonrheumatic aortic (valve) stenosis: Secondary | ICD-10-CM | POA: Diagnosis present

## 2016-12-04 DIAGNOSIS — R0602 Shortness of breath: Secondary | ICD-10-CM | POA: Diagnosis present

## 2016-12-04 DIAGNOSIS — Z85528 Personal history of other malignant neoplasm of kidney: Secondary | ICD-10-CM

## 2016-12-04 DIAGNOSIS — I482 Chronic atrial fibrillation: Secondary | ICD-10-CM | POA: Diagnosis present

## 2016-12-04 DIAGNOSIS — N179 Acute kidney failure, unspecified: Secondary | ICD-10-CM | POA: Diagnosis present

## 2016-12-04 DIAGNOSIS — I340 Nonrheumatic mitral (valve) insufficiency: Secondary | ICD-10-CM | POA: Diagnosis present

## 2016-12-04 DIAGNOSIS — I10 Essential (primary) hypertension: Secondary | ICD-10-CM | POA: Diagnosis not present

## 2016-12-04 DIAGNOSIS — I481 Persistent atrial fibrillation: Secondary | ICD-10-CM | POA: Diagnosis present

## 2016-12-04 DIAGNOSIS — Z7901 Long term (current) use of anticoagulants: Secondary | ICD-10-CM | POA: Diagnosis not present

## 2016-12-04 DIAGNOSIS — I5033 Acute on chronic diastolic (congestive) heart failure: Secondary | ICD-10-CM | POA: Diagnosis not present

## 2016-12-04 DIAGNOSIS — H3530 Unspecified macular degeneration: Secondary | ICD-10-CM | POA: Diagnosis present

## 2016-12-04 DIAGNOSIS — E785 Hyperlipidemia, unspecified: Secondary | ICD-10-CM | POA: Diagnosis present

## 2016-12-04 DIAGNOSIS — I480 Paroxysmal atrial fibrillation: Secondary | ICD-10-CM | POA: Diagnosis not present

## 2016-12-04 LAB — CBC WITH DIFFERENTIAL/PLATELET
Basophils Absolute: 0.1 10*3/uL (ref 0.0–0.1)
Basophils Relative: 1 %
Eosinophils Absolute: 0.3 10*3/uL (ref 0.0–0.7)
Eosinophils Relative: 3 %
HCT: 41.3 % (ref 36.0–46.0)
Hemoglobin: 13.7 g/dL (ref 12.0–15.0)
Lymphocytes Relative: 29 %
Lymphs Abs: 2.8 10*3/uL (ref 0.7–4.0)
MCH: 30.4 pg (ref 26.0–34.0)
MCHC: 33.2 g/dL (ref 30.0–36.0)
MCV: 91.8 fL (ref 78.0–100.0)
Monocytes Absolute: 0.9 10*3/uL (ref 0.1–1.0)
Monocytes Relative: 9 %
Neutro Abs: 5.6 10*3/uL (ref 1.7–7.7)
Neutrophils Relative %: 58 %
Platelets: 202 10*3/uL (ref 150–400)
RBC: 4.5 MIL/uL (ref 3.87–5.11)
RDW: 14.5 % (ref 11.5–15.5)
WBC: 9.6 10*3/uL (ref 4.0–10.5)

## 2016-12-04 LAB — COMPREHENSIVE METABOLIC PANEL
ALT: 15 U/L (ref 14–54)
AST: 29 U/L (ref 15–41)
Albumin: 4.1 g/dL (ref 3.5–5.0)
Alkaline Phosphatase: 80 U/L (ref 38–126)
Anion gap: 11 (ref 5–15)
BUN: 34 mg/dL — ABNORMAL HIGH (ref 6–20)
CO2: 21 mmol/L — ABNORMAL LOW (ref 22–32)
Calcium: 9.6 mg/dL (ref 8.9–10.3)
Chloride: 104 mmol/L (ref 101–111)
Creatinine, Ser: 1.52 mg/dL — ABNORMAL HIGH (ref 0.44–1.00)
GFR calc Af Amer: 36 mL/min — ABNORMAL LOW (ref >60)
GFR calc non Af Amer: 31 mL/min — ABNORMAL LOW (ref >60)
Glucose, Bld: 105 mg/dL — ABNORMAL HIGH (ref 65–99)
Potassium: 4.8 mmol/L (ref 3.5–5.1)
Sodium: 136 mmol/L (ref 135–145)
Total Bilirubin: 1.4 mg/dL — ABNORMAL HIGH (ref 0.3–1.2)
Total Protein: 6.8 g/dL (ref 6.5–8.1)

## 2016-12-04 LAB — TROPONIN I: Troponin I: <0.03 ng/mL (ref <0.03)

## 2016-12-04 LAB — BRAIN NATRIURETIC PEPTIDE: B Natriuretic Peptide: 693.6 pg/mL — ABNORMAL HIGH (ref 0.0–100.0)

## 2016-12-04 MED ORDER — DILTIAZEM HCL ER COATED BEADS 120 MG PO CP24
120.0000 mg | ORAL_CAPSULE | Freq: Every day | ORAL | Status: DC
  Administered 2016-12-04 – 2016-12-06 (×3): 120 mg via ORAL
  Filled 2016-12-04 (×3): qty 1

## 2016-12-04 MED ORDER — SODIUM CHLORIDE 0.9 % IV SOLN: 250.0000 mL | INTRAVENOUS | Status: DC | PRN

## 2016-12-04 MED ORDER — METOPROLOL TARTRATE 25 MG PO TABS
50.0000 mg | ORAL_TABLET | Freq: Two times a day (BID) | ORAL | Status: DC
  Administered 2016-12-04 – 2016-12-05 (×2): 50 mg via ORAL
  Filled 2016-12-04 (×2): qty 2

## 2016-12-04 MED ORDER — SODIUM CHLORIDE 0.9% FLUSH
3.0000 mL | Freq: Two times a day (BID) | INTRAVENOUS | Status: DC
Start: 2016-12-04 — End: 2016-12-06
  Administered 2016-12-04 – 2016-12-05 (×3): 3 mL via INTRAVENOUS

## 2016-12-04 MED ORDER — ALBUTEROL SULFATE (2.5 MG/3ML) 0.083% IN NEBU: 2.5000 mg | INHALATION_SOLUTION | Freq: Four times a day (QID) | RESPIRATORY_TRACT | Status: DC | PRN

## 2016-12-04 MED ORDER — FUROSEMIDE 10 MG/ML IJ SOLN
40.0000 mg | Freq: Once | INTRAMUSCULAR | Status: AC
  Administered 2016-12-04: 40 mg via INTRAVENOUS
  Filled 2016-12-04: qty 4

## 2016-12-04 MED ORDER — ONDANSETRON HCL 4 MG/2ML IJ SOLN: 4.0000 mg | Freq: Four times a day (QID) | INTRAMUSCULAR | Status: DC | PRN

## 2016-12-04 MED ORDER — ZOLPIDEM TARTRATE 5 MG PO TABS
5.0000 mg | ORAL_TABLET | Freq: Every evening | ORAL | Status: DC | PRN
  Administered 2016-12-05: 5 mg via ORAL
  Filled 2016-12-04: qty 1

## 2016-12-04 MED ORDER — SODIUM CHLORIDE 0.9% FLUSH: 3.0000 mL | INTRAVENOUS | Status: DC | PRN

## 2016-12-04 MED ORDER — SPIRONOLACTONE 25 MG PO TABS: 25.0000 mg | ORAL_TABLET | Freq: Every day | ORAL | Status: DC

## 2016-12-04 MED ORDER — FUROSEMIDE 10 MG/ML IJ SOLN
40.0000 mg | Freq: Three times a day (TID) | INTRAMUSCULAR | Status: DC
Start: 2016-12-04 — End: 2016-12-05
  Administered 2016-12-04 – 2016-12-05 (×2): 40 mg via INTRAVENOUS
  Filled 2016-12-04 (×2): qty 4

## 2016-12-04 MED ORDER — APIXABAN 5 MG PO TABS
5.0000 mg | ORAL_TABLET | Freq: Two times a day (BID) | ORAL | Status: DC
  Administered 2016-12-04: 5 mg via ORAL
  Filled 2016-12-04 (×2): qty 1

## 2016-12-04 MED ORDER — ROSUVASTATIN CALCIUM 20 MG PO TABS
20.0000 mg | ORAL_TABLET | Freq: Every day | ORAL | Status: DC
  Administered 2016-12-04 – 2016-12-05 (×2): 20 mg via ORAL
  Filled 2016-12-04 (×2): qty 1

## 2016-12-04 NOTE — Telephone Encounter (Signed)
Has been having troubles breathing for a few days now. Mostly when she lays down to sleep. Has to sleep sitting up in recliner. Ask that dr calls her. ag

## 2016-12-04 NOTE — ED Notes (Signed)
ED Provider at bedside. 

## 2016-12-04 NOTE — ED Notes (Signed)
Pt able to use bedside toilet, tolerated well.

## 2016-12-04 NOTE — ED Provider Notes (Signed)
[place] EMERGENCY DEPARTMENT Provider Note   CSN: [id number] Arrival date & time: 12/04/16  1154     History   Chief Complaint Chief Complaint  Patient presents with  . Shortness of Breath  . Fatigue    HPI Jodi [name] is a 83 y.o. female.  HPI  Patient p/w SOB, fatigue and DOE. She has Hx of CHF, valvular disease and afib.  She cardiology within the past two weeks and has started taking a beta blocker. She now has c/o SOB/ DOE that is progressive over the recent past.  No clear alleviating or exacerbating factors. No obvious weight gain or loss. no n/v/d.  In particular she notes that over the past few days she has become incapable of walking even a very short distance without dyspnea and fatigue.     Past Medical History:  Diagnosis Date  . Aortic stenosis   . Arthritis    "maybe in my fingers and toes" (09/28/2014)  . Bradycardia   . CHF (congestive heart failure) ([place])   . Chronic renal insufficiency, stage III (moderate) ([place])    [name] 09/27/2014  . Coronary artery disease   . Heart murmur   . Hyperlipidemia   . Hypertension   . Macular degeneration, left eye   . Myocardial infarction [facility]) 1995; 2003  . Paroxysmal atrial fibrillation (HCC)   . Renal cell carcinoma ([place]) 09/23/2012  . Renal mass 12/06/2010   CT Abdomen 11-27-10 upper pole region of the right kidney which is suspicious for solid lesion, measuring 1.9 x 1.3 cm. This lesion is concerning for renal cell carcinoma given the solid appearance.  Following with Dr [name].  Renal bx was benign    . Shortness of breath   . Splenic infarct 12/06/2010  . Stroke [facility]) 2003   "when I had heart surgery"; denies residual on 09/28/2014  . TIA (transient ischemic attack) "several"    Patient Active Problem List   Diagnosis Date Noted  . Pulmonary nodule 08/21/2016  . Thyroid nodule 08/21/2016  . Bruising 08/21/2016  . Hyperglycemia 11/09/2015  . Acute bronchitis due to  other specified organisms 04/11/2015  . Dyspnea 03/05/2015  . Chronic combined systolic and diastolic congestive heart failure ([place]) 03/04/2015  . Left renal mass   . Paroxysmal atrial fibrillation ([place]) 08/20/2013  . Community acquired pneumonia of right lower lobe of lung ([place]) 02/02/2013  . Renal cell carcinoma ([place]) 09/23/2012  . Chronic kidney disease (CKD), stage III (moderate) ([place]) 02/20/2012  . Aortic stenosis, severe 12/06/2010  . SEBORRHEA 11/10/2007  . Insomnia 06/09/2007  . HYPERCHOLESTEROLEMIA 04/04/2006  . HYPERTENSION, BENIGN SYSTEMIC 04/04/2006  . CORONARY, ARTERIOSCLEROSIS 04/04/2006  . Acute combined systolic and diastolic heart failure ([place]) 04/04/2006  . Osteoarthritis involving multiple joints on both sides of body 04/04/2006    Past Surgical History:  Procedure Laterality Date  . CARDIAC CATHETERIZATION    . CARDIOVERSION N/A 10/20/2013   Procedure: CARDIOVERSION;  Surgeon: Laverda Page, MD;  Location: [place];  Service: Cardiovascular;  Laterality: N/A;  H&P in file  . CARDIOVERSION N/A 05/18/2014   Procedure: CARDIOVERSION;  Surgeon: [name], MD;  Location: [facility] ENDOSCOPY;  Service: Cardiovascular;  Laterality: N/A;  . CARDIOVERSION N/A 10/31/2016   Procedure: CARDIOVERSION;  Surgeon: [name], MD;  Location: [facility] ENDOSCOPY;  Service: Cardiovascular;  Laterality: N/A;  . CARDIOVERSION N/A 11/20/2016   Procedure: CARDIOVERSION;  Surgeon: [name], MD;  Location: [place];  Service: Cardiovascular;  Laterality: N/A;  .  CATARACT EXTRACTION W/ INTRAOCULAR LENS  IMPLANT, BILATERAL Bilateral ~ 2013  . CORONARY ANGIOPLASTY    . CORONARY ARTERY BYPASS GRAFT  1995 and 2003  . IR RADIOLOGIST EVAL & MGMT  07/03/2016  . LEFT AND RIGHT HEART CATHETERIZATION WITH CORONARY ANGIOGRAM N/A 11/24/2013   Procedure: LEFT AND RIGHT HEART CATHETERIZATION WITH CORONARY ANGIOGRAM;  Surgeon: Laverda Page, MD;  Location: [facility] CATH LAB;  Service: Cardiovascular;   Laterality: N/A;  . PERCUTANEOUS NEEDLE BIOPSY OF RENAL LESION  ?2014  . TOTAL ABDOMINAL HYSTERECTOMY  1990's   "both ovaries were full of little tiny sores"  . TYMPANOPLASTY Bilateral    "had holes in them; still have holes in them"    OB History    No data available       Home Medications    Prior to Admission medications   Medication Sig Start Date End Date Taking? Authorizing Provider  albuterol (PROVENTIL HFA;VENTOLIN HFA) 108 (90 Base) MCG/ACT inhaler Inhale 1 puff into the lungs every 6 (six) hours as needed for wheezing or shortness of breath. 09/26/16  Yes Chambliss, Jeb Levering, MD  amLODipine (NORVASC) 10 MG tablet Take 10 mg by mouth daily.  03/14/15  Yes [provider]  apixaban (ELIQUIS) 5 MG TABS tablet Take 1 tablet (5 mg total) by mouth 2 (two) times daily. 08/21/13  Yes [name], MD  beta carotene w/minerals (OCUVITE) tablet Take 1 tablet by mouth daily.   Yes [provider]  Cholecalciferol (VITAMIN D3) 2000 UNITS TABS Take 2,000 Units by mouth daily.    Yes [provider]  CRANBERRY PO Take 2 capsules by mouth daily.    Yes [provider]  diltiazem (CARDIZEM) 30 MG tablet Take 1 tablet by mouth every 4 hours for palpitations as needed. Patient taking differently: Take 30 mg by mouth every 4 (four) hours as needed (for palpitations).  11/26/16  Yes Allred, Jeneen Rinks, MD  furosemide (LASIX) 20 MG tablet Take 1 tablet (20 mg total) by mouth daily. Daily until your hospital follow up. Patient taking differently: Take 20 mg by mouth every Monday, Wednesday, and Friday.  03/05/15  Yes Rosemarie Ax, MD  metoprolol tartrate (LOPRESSOR) 50 MG tablet Take 50 mg by mouth 2 (two) times daily.   Yes [provider]  Multiple Vitamin (MULTIVITAMIN WITH MINERALS) TABS tablet Take 1 tablet by mouth daily.   Yes [provider]  naproxen sodium (ALEVE) 220 MG tablet Take 220 mg by mouth 2 (two) times daily as needed (for pain or  headaches).   Yes [provider]  rosuvastatin (CRESTOR) 20 MG tablet TAKE 1 TABLET BY MOUTH DAILY Patient taking differently: Take 20 mg by mouth once a day 04/10/16  Yes Chambliss, Jeb Levering, MD  spironolactone (ALDACTONE) 25 MG tablet TAKE 1 TABLET BY MOUTH EVERY DAY. Patient taking differently: Take 25 mg by mouth once a day 09/10/16  Yes Chambliss, Jeb Levering, MD  zolpidem (AMBIEN) 5 MG tablet TAKE 1 TABLET BY MOUTH AT BEDTIME AS NEEDED FOR SLEEP. Patient taking differently: Take 5 mg by mouth at bedtime as needed for sleep 11/12/16  Yes Chambliss, Jeb Levering, MD    Family History Family History  Problem Relation Age of Onset  . Coronary artery disease Mother   . Stroke Mother   . Leukemia Father   . Leukemia Sister   . Cancer Brother   . Coronary artery disease Brother   . Heart attack Brother   . Heart attack  Son   . Heart attack Daughter   . Myelodysplastic syndrome Sister     Social History Social History  Substance Use Topics  . Smoking status: Never Smoker  . Smokeless tobacco: Never Used  . Alcohol use No     Allergies   Sulfamethoxazole-trimethoprim and Tape   Review of Systems Review of Systems  Constitutional:       Per HPI, otherwise negative  HENT:       Per HPI, otherwise negative  Respiratory: Positive for shortness of breath. Negative for chest tightness.   Cardiovascular:       Per HPI, otherwise negative  Gastrointestinal: Negative for vomiting.  Endocrine:       Negative aside from HPI  Genitourinary:       Neg aside from HPI   Musculoskeletal:       Per HPI, otherwise negative  Skin: Negative.   Neurological: Positive for weakness. Negative for syncope.     Physical Exam Updated Vital Signs BP (!) 162/79   Pulse 86   Temp 97.7 F (36.5 C) (Oral)   Resp (!) 25   Ht 5' 3.5" (1.613 m)   Wt 71.7 kg (158 lb)   SpO2 94%   BMI 27.55 kg/m   Physical Exam  Constitutional: She is oriented to person, place, and time. She  appears well-developed and well-nourished. No distress.  HENT:  Head: Normocephalic and atraumatic.  Eyes: Conjunctivae and EOM are normal.  Cardiovascular: An irregularly irregular rhythm present. Bradycardia present.   Murmur heard. Pulmonary/Chest: No stridor. She has decreased breath sounds.  Abdominal: She exhibits no distension.  Musculoskeletal: She exhibits no edema.  Neurological: She is alert and oriented to person, place, and time. No cranial nerve deficit.  Skin: Skin is warm and dry.  Psychiatric: She has a normal mood and affect.  Nursing note and vitals reviewed.    ED Treatments / Results  Labs (all labs ordered are listed, but only abnormal results are displayed) Labs Reviewed  COMPREHENSIVE METABOLIC PANEL - Abnormal; Notable for the following:       Result Value   CO2 21 (*)    Glucose, Bld 105 (*)    BUN 34 (*)    Creatinine, Ser 1.52 (*)    Total Bilirubin 1.4 (*)    GFR calc non Af Amer 31 (*)    GFR calc Af Amer 36 (*)    All other components within normal limits  BRAIN NATRIURETIC PEPTIDE - Abnormal; Notable for the following:    B Natriuretic Peptide 693.6 (*)    All other components within normal limits  CBC WITH DIFFERENTIAL/PLATELET  TROPONIN I    EKG  EKG Interpretation  Date/Time:  Tuesday December 04 2016 12:14:18 EDT Ventricular Rate:  50 PR Interval:    QRS Duration: 117 QT Interval:  442 QTC Calculation: 403 R Axis:   62 Text Interpretation:  Sinus bradycardia Artifact Premature atrial complexes Non-specific intra-ventricular conduction delay Abnormal ekg Confirmed by Carmin Muskrat [phone]) on 12/04/2016 3:44:58 PM       Cardiac monitor the patient has inconsistent rhythm, sinus bradycardia versus junctional bradycardia with periods of atrial fibrillation.    Radiology Dg Chest 2 View  Result Date: 12/04/2016 CLINICAL DATA:  Shortness of breath and fatigue which have worsened over the past few weeks. EXAM: CHEST  2 VIEW  COMPARISON:  PA and lateral chest 08/14/2016.  CT chest 08/25/2016. FINDINGS: There is marked cardiomegaly without edema. No consolidative process, pneumothorax or pleural  effusion. No acute bony abnormality. The patient is status post CABG. IMPRESSION: Marked cardiomegaly without acute disease. Electronically Signed   By: Inge Rise M.D.   On: 12/04/2016 12:36    Procedures Procedures (including critical care time)  Medications Ordered in ED Medications  furosemide (LASIX) injection 40 mg (not administered)     Initial Impression / Assessment and Plan / ED Course  I have reviewed the triage vital signs and the nursing notes.  Pertinent labs & imaging results that were available during my care of the patient were reviewed by me and considered in my medical decision making (see chart for details).  3:45 PM Patient is comfortable. Patient has received Lasix. With concern for worsening heart failure, as well as consideration of her valvular disease contributing to her substantial dyspnea on exertion, ongoing shortness of breath, I've discussed the patient's case with her cardiologist for admission for further evaluation, management.   Final Clinical Impressions(s) / ED Diagnoses  Congestive heart failure exacerbation   Carmin Muskrat, MD 12/04/16 1546

## 2016-12-04 NOTE — ED Notes (Signed)
Patient transported to X-ray 

## 2016-12-04 NOTE — H&P (Addendum)
Jodi [name] is an 83 y.o. female.   Chief Complaint: Shortness of breath HPI: Patient with known coronary artery disease, CABG 1994 and redo CABG in 2003, history of moderately severe mitral regurgitation by echocardiogram performed in 2016, paroxysmal atrial fibrillation now appears to be permanent atrial fibrillation and was extremely symptomatic in the past, has not been able to maintain sinus rhythm in spite of recent cardioversion x2.  She is now on rate control strategy.  Had been doing well but over the past 2 days noticed gradually worsening dyspnea, last night she could not sleep due to severe orthopnea and even walking in the house she was markedly dyspneic.  She then presented to the emergency room.  In the ED, BNP was elevated, she was given IV Lasix, I was called to admit the patient and evaluate the patient.  Patient states that her dyspnea still present but appears to have improved since she received Lasix.  She admits not being noncompliant, she has been very careful with salt and diet, has been taking all her medications as prescribed.  Her past medical history significant for hypertension, hyperlipidemia, stage III chronic kidney disease, paroxysmal atrial fibrillation, history of renal cell carcinoma in remission.  She has had CAD and CABG with redo CABG in 1994 and 2003.   Past Medical History:  Diagnosis Date  . Aortic stenosis   . Arthritis    "maybe in my fingers and toes" (09/28/2014)  . Bradycardia   . CHF (congestive heart failure) ([place])   . Chronic renal insufficiency, stage III (moderate) ([place])    [name] 09/27/2014  . Coronary artery disease   . Heart murmur   . Hyperlipidemia   . Hypertension   . Macular degeneration, left eye   . Myocardial infarction [facility]) 1995; 2003  . Paroxysmal atrial fibrillation (H[facility])   . Renal cell carcinoma ([place]) 09/23/2012  . Renal mass 12/06/2010   CT Abdomen 11-27-10 upper pole region of the right kidney which is suspicious for  solid lesion, measuring 1.9 x 1.3 cm. This lesion is concerning for renal cell carcinoma given the solid appearance.  Following with Dr [name].  Renal bx was benign    . Shortness of breath   . Splenic infarct 12/06/2010  . Stroke [facility]) 2003   "when I had heart surgery"; denies residual on 09/28/2014  . TIA (transient ischemic attack) "several"    Past Surgical History:  Procedure Laterality Date  . CARDIAC CATHETERIZATION    . CARDIOVERSION N/A 10/20/2013   Procedure: CARDIOVERSION;  Surgeon: Laverda Page, MD;  Location: [place];  Service: Cardiovascular;  Laterality: N/A;  H&P in file  . CARDIOVERSION N/A 05/18/2014   Procedure: CARDIOVERSION;  Surgeon: [name], MD;  Location: [facility] ENDOSCOPY;  Service: Cardiovascular;  Laterality: N/A;  . CARDIOVERSION N/A 10/31/2016   Procedure: CARDIOVERSION;  Surgeon: [name], MD;  Location: [facility] ENDOSCOPY;  Service: Cardiovascular;  Laterality: N/A;  . CARDIOVERSION N/A 11/20/2016   Procedure: CARDIOVERSION;  Surgeon: [name], MD;  Location: [place];  Service: Cardiovascular;  Laterality: N/A;  . CATARACT EXTRACTION W/ INTRAOCULAR LENS  IMPLANT, BILATERAL Bilateral ~ 2013  . CORONARY ANGIOPLASTY    . CORONARY ARTERY BYPASS GRAFT  1995 and 2003  . IR RADIOLOGIST EVAL & MGMT  07/03/2016  . LEFT AND RIGHT HEART CATHETERIZATION WITH CORONARY ANGIOGRAM N/A 11/24/2013   Procedure: LEFT AND RIGHT HEART CATHETERIZATION WITH CORONARY ANGIOGRAM;  Surgeon: Laverda Page, MD;  Location: [facility] CATH LAB;  Service:  Cardiovascular;  Laterality: N/A;  . PERCUTANEOUS NEEDLE BIOPSY OF RENAL LESION  ?2014  . TOTAL ABDOMINAL HYSTERECTOMY  1990's   "both ovaries were full of little tiny sores"  . TYMPANOPLASTY Bilateral    "had holes in them; still have holes in them"    Family History  Problem Relation Age of Onset  . Coronary artery disease Mother   . Stroke Mother   . Leukemia Father   . Leukemia Sister   . Cancer Brother   . Coronary artery  disease Brother   . Heart attack Brother   . Heart attack Son   . Heart attack Daughter   . Myelodysplastic syndrome Sister    Social History:  reports that she has never smoked. She has never used smokeless tobacco. She reports that she does not drink alcohol or use drugs.  Allergies:  Allergies  Allergen Reactions  . Sulfamethoxazole-Trimethoprim Other (See Comments)    Caused shaking and chills  . Tape Other (See Comments)    Tears skin.  Please use "paper" tape     (Not in a hospital admission)  Results for orders placed or performed during the hospital encounter of 12/04/16 (from the past 48 hour(s))  Comprehensive metabolic panel     Status: Abnormal   Collection Time: 12/04/16 12:03 PM  Result Value Ref Range   Sodium 136 135 - 145 mmol/L   Potassium 4.8 3.5 - 5.1 mmol/L   Chloride 104 101 - 111 mmol/L   CO2 21 (L) 22 - 32 mmol/L   Glucose, Bld 105 (H) 65 - 99 mg/dL   BUN 34 (H) 6 - 20 mg/dL   Creatinine, Ser 1.52 (H) 0.44 - 1.00 mg/dL   Calcium 9.6 8.9 - 10.3 mg/dL   Total Protein 6.8 6.5 - 8.1 g/dL   Albumin 4.1 3.5 - 5.0 g/dL   AST 29 15 - 41 U/L   ALT 15 14 - 54 U/L   Alkaline Phosphatase 80 38 - 126 U/L   Total Bilirubin 1.4 (H) 0.3 - 1.2 mg/dL   GFR calc non Af Amer 31 (L) >60 mL/min   GFR calc Af Amer 36 (L) >60 mL/min    Comment: (NOTE) The eGFR has been calculated using the CKD EPI equation. This calculation has not been validated in all clinical situations. eGFR's persistently <60 mL/min signify possible Chronic Kidney Disease.    Anion gap 11 5 - 15  CBC WITH DIFFERENTIAL     Status: None   Collection Time: 12/04/16 12:03 PM  Result Value Ref Range   WBC 9.6 4.0 - 10.5 K/uL   RBC 4.50 3.87 - 5.11 MIL/uL   Hemoglobin 13.7 12.0 - 15.0 g/dL   HCT 41.3 36.0 - 46.0 %   MCV 91.8 78.0 - 100.0 fL   MCH 30.4 26.0 - 34.0 pg   MCHC 33.2 30.0 - 36.0 g/dL   RDW 14.5 11.5 - 15.5 %   Platelets 202 150 - 400 K/uL   Neutrophils Relative % 58 %   Neutro Abs  5.6 1.7 - 7.7 K/uL   Lymphocytes Relative 29 %   Lymphs Abs 2.8 0.7 - 4.0 K/uL   Monocytes Relative 9 %   Monocytes Absolute 0.9 0.1 - 1.0 K/uL   Eosinophils Relative 3 %   Eosinophils Absolute 0.3 0.0 - 0.7 K/uL   Basophils Relative 1 %   Basophils Absolute 0.1 0.0 - 0.1 K/uL  Troponin I (MHP)     Status: None   Collection  Time: 12/04/16 12:03 PM  Result Value Ref Range   Troponin I <0.03 <0.03 ng/mL  Brain natriuretic peptide     Status: Abnormal   Collection Time: 12/04/16 12:03 PM  Result Value Ref Range   B Natriuretic Peptide 693.6 (H) 0.0 - 100.0 pg/mL   Dg Chest 2 View  Result Date: 12/04/2016 CLINICAL DATA:  Shortness of breath and fatigue which have worsened over the past few weeks. EXAM: CHEST  2 VIEW COMPARISON:  PA and lateral chest 08/14/2016.  CT chest 08/25/2016. FINDINGS: There is marked cardiomegaly without edema. No consolidative process, pneumothorax or pleural effusion. No acute bony abnormality. The patient is status post CABG. IMPRESSION: Marked cardiomegaly without acute disease. Electronically Signed   By: Inge Rise M.D.   On: 12/04/2016 12:36    Review of Systems  Constitutional: Negative.   HENT: Negative.   Eyes: Negative.   Respiratory: Positive for shortness of breath. Negative for cough, hemoptysis, sputum production and wheezing.   Cardiovascular: Positive for orthopnea and PND. Negative for chest pain, palpitations, claudication and leg swelling.  Gastrointestinal: Negative.   Genitourinary: Negative.   Musculoskeletal: Negative.   Skin: Negative.   Neurological: Negative.   Endo/Heme/Allergies: Negative.   Psychiatric/Behavioral: Negative.     Blood pressure 140/87, pulse 72, temperature 97.7 F (36.5 C), temperature source Oral, resp. rate (!) 23, height 5' 3.5" (1.613 m), weight 71.7 kg (158 lb), SpO2 97 %. Physical Exam  Constitutional: She is oriented to person, place, and time. She appears well-nourished.  Moderately built   HENT:  Head: Normocephalic and atraumatic.  Eyes: Conjunctivae and EOM are normal. Right eye exhibits discharge. No scleral icterus.  Neck: Normal range of motion. Neck supple. JVD present.  Cardiovascular:  S1 is muffled, S2 is normal, long systolic murmur heard in the apex, 3 out of 6 in intensity, conducted to the axilla.  No gallop or rub.  Trace right leg edema present.  Respiratory: She has rales (Bilateral basal crackles heard right more than the left.).  GI: Soft. Bowel sounds are normal. She exhibits no distension. There is no tenderness. There is no rebound.  Musculoskeletal: Normal range of motion. She exhibits no edema or tenderness.  Lymphadenopathy:    She has no cervical adenopathy.  Neurological: She is alert and oriented to person, place, and time.  Skin: Skin is warm and dry.    Assessment/Plan 1.  Acute on chronic systolic and diastolic heart failure secondary to valvular heart disease 2.  Moderately severe, probably severe mitral regurgitation by physical exam, she was scheduled to have echocardiogram in 2 days. 3.  Chronic renal insufficiency stage III, serum creatinine has worsened since prior lab evaluation. 4.  Hypertension 5.  Persistent atrial fibrillation. CHA2DS2-VASc Score is 5 with yearly risk of stroke of 6.7%.  6.  CAD of the native vessel and bypass graft. CABG in 1994. Stents in 2002. H/O Redo CABG 06/05/01 consisting of a patent LIMA to the LAD, with free RIMA to RI, SVG to OM1, SVG to PL A, SVG to ramus intermediate branch Dr. [name].  11/24/2013: R + L Heart Cath; Moderate pulmonary hypertension, here pressure 46/18 mmHg. Cardiac output 3.96, index 2.2. SVG to RCA ostial 90%, patent free RIMA to RI, SVG to OM1, LIMA to LAD. Aortic valve area 1.1 cm, Mean gradient of 14 mmHg. Moderate to severe mitral regurgitation, LVEF 40-45% with basal to midinferior akinesis.  Recommendation: We will admit the patient and diurese her.  Repeat echocardiogram in  the morning.  Should be extremely high risk for redo mitral and possibly aortic valve replacement as patient also has moderate aortic stenosis.  In view of her multiple medical comorbidities, medical therapy is recommended.  I have discussed this with the patient and her daughter at the bedside.  I concerned that she may be reaching closer towards frequent CHF and end of life.  Will hold off on spironolactone until renal function is reevaluated in the morning.  [name], MD 12/04/2016, 5:59 PM

## 2016-12-04 NOTE — ED Triage Notes (Signed)
Pt arrives from home via [facility] reporting SOB with fatigue worsening over several weeks.  Pt denies CP, dizziness, reports exertional SOB and orthopnea.  Pt became very fatigued transferring from chair to bed.  Lung sounds clear, pitting edema noted BLE.

## 2016-12-04 NOTE — ED Notes (Signed)
Ordered heart healthy tray  

## 2016-12-05 ENCOUNTER — Inpatient Hospital Stay (HOSPITAL_COMMUNITY): Payer: Medicare Other

## 2016-12-05 LAB — BASIC METABOLIC PANEL
Anion gap: 11 (ref 5–15)
BUN: 39 mg/dL — ABNORMAL HIGH (ref 6–20)
CO2: 27 mmol/L (ref 22–32)
Calcium: 9.4 mg/dL (ref 8.9–10.3)
Chloride: 100 mmol/L — ABNORMAL LOW (ref 101–111)
Creatinine, Ser: 1.6 mg/dL — ABNORMAL HIGH (ref 0.44–1.00)
GFR calc Af Amer: 33 mL/min — ABNORMAL LOW (ref >60)
GFR calc non Af Amer: 29 mL/min — ABNORMAL LOW (ref >60)
Glucose, Bld: 134 mg/dL — ABNORMAL HIGH (ref 65–99)
Potassium: 4.3 mmol/L (ref 3.5–5.1)
Sodium: 138 mmol/L (ref 135–145)

## 2016-12-05 LAB — BRAIN NATRIURETIC PEPTIDE: B Natriuretic Peptide: 648.5 pg/mL — ABNORMAL HIGH (ref 0.0–100.0)

## 2016-12-05 LAB — ECHOCARDIOGRAM COMPLETE
Height: 63 in
Weight: 2499.2 oz

## 2016-12-05 MED ORDER — PERFLUTREN LIPID MICROSPHERE
1.0000 mL | INTRAVENOUS | Status: AC | PRN
  Administered 2016-12-05: 2 mL via INTRAVENOUS
  Filled 2016-12-05: qty 10

## 2016-12-05 MED ORDER — FUROSEMIDE 10 MG/ML IJ SOLN: 80.0000 mg | Freq: Three times a day (TID) | INTRAMUSCULAR | Status: DC

## 2016-12-05 MED ORDER — APIXABAN 2.5 MG PO TABS
2.5000 mg | ORAL_TABLET | Freq: Two times a day (BID) | ORAL | Status: DC
  Administered 2016-12-05 – 2016-12-06 (×2): 2.5 mg via ORAL
  Filled 2016-12-05 (×3): qty 1

## 2016-12-05 MED ORDER — MAGNESIUM HYDROXIDE 400 MG/5ML PO SUSP
15.0000 mL | Freq: Every day | ORAL | Status: DC | PRN
  Administered 2016-12-05: 15 mL via ORAL
  Filled 2016-12-05: qty 30

## 2016-12-05 MED ORDER — DICLOFENAC SODIUM 1 % TD GEL
2.0000 g | TRANSDERMAL | Status: DC | PRN
  Administered 2016-12-05: 2 g via TOPICAL
  Filled 2016-12-05: qty 100

## 2016-12-05 MED ORDER — METOPROLOL TARTRATE 25 MG PO TABS
25.0000 mg | ORAL_TABLET | Freq: Two times a day (BID) | ORAL | Status: DC
  Administered 2016-12-05: 25 mg via ORAL
  Filled 2016-12-05 (×2): qty 1

## 2016-12-05 MED ORDER — FUROSEMIDE 10 MG/ML IJ SOLN
40.0000 mg | Freq: Three times a day (TID) | INTRAMUSCULAR | Status: AC
  Administered 2016-12-05: 40 mg via INTRAVENOUS
  Filled 2016-12-05: qty 4

## 2016-12-05 NOTE — Progress Notes (Signed)
Pt on Metoprolol 50 mg PO BID. Pt's heart rate now in the 47-50's. MD notified and ordered to decrease it to 25 mg PO BID. Will continue to monitor pt.

## 2016-12-05 NOTE — Progress Notes (Addendum)
Subjective:  Still has dyspnea. States she is diuresing well with frequent trips to bathroom.  Objective:   Intake/Output from previous day: 10/30 0701 - 10/31 0700 In: -  Out: 500 [Urine:500]  Physical Exam: Blood pressure 114/61, pulse (!) 20, temperature (!) 97.5 F (36.4 C), temperature source Oral, resp. rate 18, height 5\' 3"  (1.6 m), weight 70.9 kg (156 lb 3.2 oz), SpO2 97 %.  Neck: Normal range of motion. Neck supple. JVD present.  Cardiovascular:  S1 is muffled, S2 is normal, long systolic murmur heard in the apex, 3 out of 6 in intensity, conducted to the axilla.  No gallop or rub.  Trace right leg edema present.  Respiratory: She has rales (Bilateral basal crackles heard right more than the left.).  GI: Soft. Bowel sounds are normal. She exhibits no distension. There is no tenderness. There is no rebound.  Musculoskeletal: Normal range of motion. She exhibits no edema or tenderness.  Lymphadenopathy:    She has no cervical adenopathy.  Neurological: She is alert and oriented to person, place, and time.  Skin: Skin is warm and dry.   Lab Results: BMP  Recent Labs  08/25/16 1022 12/04/16 1203 12/05/16 0429  NA 138 136 138  K 4.4 4.8 4.3  CL 105 104 100*  CO2 21* 21* 27  GLUCOSE 135* 105* 134*  BUN 22* 34* 39*  CREATININE 1.11* 1.52* 1.60*  CALCIUM 9.7 9.6 9.4  GFRNONAA 45* 31* 29*  GFRAA 53* 36* 33*    CBC  Recent Labs Lab 12/04/16 1203  WBC 9.6  RBC 4.50  HGB 13.7  HCT 41.3  PLT 202  MCV 91.8  MCH 30.4  MCHC 33.2  RDW 14.5  LYMPHSABS 2.8  MONOABS 0.9  EOSABS 0.3  BASOSABS 0.1    HEMOGLOBIN A1C Lab Results  Component Value Date   HGBA1C 6.1 10/02/2016   MPG 137 (H) 11/29/2010    Cardiac Panel (last 3 results)  Recent Labs  12/04/16 1203  TROPONINI <0.03   Lipid Panel     Component Value Date/Time   CHOL 176 11/09/2015 1127   TRIG 169 (H) 11/09/2015 1127   HDL 53 11/09/2015 1127   CHOLHDL 3.3 11/09/2015 1127   VLDL 34 (H)  11/09/2015 1127   LDLCALC 89 11/09/2015 1127   LDLDIRECT 79 10/22/2011 1448   BNP (last 3 results)  Recent Labs  12/04/16 1203 12/05/16 0429  BNP 693.6* 648.5*  Hepatic Function Panel  Recent Labs  08/17/16 1858 08/25/16 1022 12/04/16 1203  PROT 7.2 7.1 6.8  ALBUMIN 4.6 4.3 4.1  AST 34 34 29  ALT 23 24 15   ALKPHOS 59 60 80  BILITOT 1.3* 1.7* 1.4*    Imaging: Dg Chest 2 View  Result Date: 12/04/2016 CLINICAL DATA:  Shortness of breath and fatigue which have worsened over the past few weeks. EXAM: CHEST  2 VIEW COMPARISON:  PA and lateral chest 08/14/2016.  CT chest 08/25/2016. FINDINGS: There is marked cardiomegaly without edema. No consolidative process, pneumothorax or pleural effusion. No acute bony abnormality. The patient is status post CABG. IMPRESSION: Marked cardiomegaly without acute disease. Electronically Signed   By: Inge Rise M.D.   On: 12/04/2016 12:36    Cardiac Studies:  EKG 12/04/2016: Atrial fibrillation with controlled ventricular response at the rate of 50 bpm, normal QT interval.  LVH.  Nonspecific T abnormality.  ECHO: 12/05/2016: - Left ventricle: The cavity size was mildly dilated. Systolic  function was moderately to severely reduced. The  estimated   ejection fraction was in the range of 30% to 35%. Mild diffuse  hypokinesis. Severe hypokinesis of the entireinferolateral and  inferior myocardium. The study is not technically sufficient to  allow evaluation of LV diastolic function. - Aortic valve: Moderately calcified annulus. Trileaflet; severely  thickened, severely calcified leaflets. Valve mobility was  severely restricted. There was severe stenosis. Mean gradient  (S): 27 mm Hg. Peak gradient (S): 51 mm Hg. Valve area (VTI): 0.6  cm^2. Valve area (Vmax): 0.62 cm^2. Valve area (Vmean): 0.59   cm^2. - Mitral valve: Moderately to severely calcified annulus.  Moderately calcified leaflets . Mobility of the posterior leaflet  was moderately  restricted. There was moderate to severe  regurgitation directed posteriorly. Valve area by continuity  equation (using LVOT flow): 1.05 cm^2. - Left atrium: The atrium was moderately dilated. - Right atrium: The atrium was mildly dilated. - Pulmonary arteries: PA peak pressure: 49 mm Hg (S). The right ventricular systolic pressure was   increased consistent with moderate pulmonary hypertension.  Scheduled Meds: . apixaban  2.5 mg Oral BID  . diltiazem  120 mg Oral Daily  . metoprolol tartrate  50 mg Oral BID  . rosuvastatin  20 mg Oral q1800  . sodium chloride flush  3 mL Intravenous Q12H   Continuous Infusions: . sodium chloride     PRN Meds:.sodium chloride, albuterol, magnesium hydroxide, ondansetron (ZOFRAN) IV, sodium chloride flush, zolpidem   Assessment/Plan:  1.  Acute on chronic systolic and diastolic heart failure with severe LV systolic dysfunction 2.  Severe valvular heart disease with moderately severe MR and severe aortic stenosis. 3.  Chronic atrial fibrillation, rate control strategy only. CHA2DS2-VASc Score is 5 with yearly risk of stroke of 6.7%. 4.  Acute on chronic Chronic stage III kidney disease 5.  Hypertension 6.  CAD of the native vessel and bypass graft. CABG in 1994. Stents in 2002. H/O Redo CABG 06/05/01 consisting of a patent LIMA to the LAD, with free RIMA to RI, SVG to OM1, SVG to PL A, SVG to ramus intermediate branch Dr. [name].  11/24/2013: R + L Heart Cath; Moderate pulmonary hypertension, here pressure 46/18 mmHg. Cardiac output 3.96, index 2.2. SVG to RCA ostial 90%, patent free RIMA to RI, SVG to OM1, LIMA to LAD. Aortic valve area 1.1 cm, Mean gradient of 14 mmHg. Moderate to severe mitral regurgitation, LVEF 40-45% with basal to midinferior akinesis.  Recommendation: I have discussed the findings with the patient's son, poor long-term prognosis with expectation of frequent CHF.  Continue diuresis for now.  We will follow up on the BMP.   Consider switching to p.o. furosemide.  Otherwise she is on maximal medical therapy.  ACE inhibitor/Aldactone has been on hold due to slight worsening renal function, may consider adding BiDil.  [name], M.D. 12/05/2016, 6:15 PM [facility] [place] Pager: [phone] Office: [phone] If no answer: [phone]

## 2016-12-05 NOTE — Progress Notes (Signed)
MD Rounding patient is down for ECHO. Plan to IV diuresis and switch to PO tomorrow.

## 2016-12-05 NOTE — Telephone Encounter (Signed)
Patient admitted

## 2016-12-05 NOTE — Evaluation (Signed)
Physical Therapy Evaluation & Discharge Patient Details Name: Jodi [name] MRN: [id number] DOB: [birth year] Today's Date: 12/05/2016   History of Present Illness  Pt is an 83 y/o female admitted secodnary to SOB with acute on chronic HF secondary to valvular heart disease. PMH including but not limited to CHF, CAD s/p CABG in 1993 and 2003.  Clinical Impression  Pt presented supine in bed with HOB elevated, awake and willing to participate in therapy session. Pt with multiple family members present throughout session. Pt ambulated in hallway with supervision without use of an AD. Pt required one standing rest break secondary to SOB; however, pt on RA with SPO2 maintaining in high 90's throughout. Pt reported that she feels she is at her baseline in regards to functional mobility. No further acute PT needs identified at this time. PT signing off.     Follow Up Recommendations No PT follow up    Equipment Recommendations  None recommended by PT    Recommendations for Other Services       Precautions / Restrictions Restrictions Weight Bearing Restrictions: No      Mobility  Bed Mobility Overal bed mobility: Independent                Transfers Overall transfer level: Modified independent Equipment used: None                Ambulation/Gait Ambulation/Gait assistance: Supervision Ambulation Distance (Feet): 250 Feet Assistive device: None Gait Pattern/deviations: Step-through pattern Gait velocity: decreased   General Gait Details: pt required one standing rest break secondary to SOB. pt ambulated on RA with SPO2 maintaining in the high 90's throughout  Stairs            Wheelchair Mobility    Modified Rankin (Stroke Patients Only)       Balance Overall balance assessment: Needs assistance Sitting-balance support: Feet supported Sitting balance-Leahy Scale: Good     Standing balance support: During functional activity;No upper extremity  supported Standing balance-Leahy Scale: Good                               Pertinent Vitals/Pain Pain Assessment: No/denies pain    Home Living Family/patient expects to be discharged to:: Private residence Living Arrangements: Alone Available Help at Discharge: Family;Available 24 hours/day Type of Home: Apartment Home Access: Level entry     Home Layout: One level Home Equipment: Cane - single point      Prior Function Level of Independence: Independent               Hand Dominance        Extremity/Trunk Assessment   Upper Extremity Assessment Upper Extremity Assessment: Overall WFL for tasks assessed    Lower Extremity Assessment Lower Extremity Assessment: Overall WFL for tasks assessed    Cervical / Trunk Assessment Cervical / Trunk Assessment: Normal  Communication   Communication: No difficulties  Cognition Arousal/Alertness: Awake/alert Behavior During Therapy: WFL for tasks assessed/performed Overall Cognitive Status: Within Functional Limits for tasks assessed                                        General Comments      Exercises     Assessment/Plan    PT Assessment Patent does not need any further PT services  PT Problem List  PT Treatment Interventions      PT Goals (Current goals can be found in the Care Plan section)  Acute Rehab PT Goals Patient Stated Goal: return home    Frequency     Barriers to discharge        Co-evaluation               AM-PAC PT "6 Clicks" Daily Activity  Outcome Measure Difficulty turning over in bed (including adjusting bedclothes, sheets and blankets)?: None Difficulty moving from lying on back to sitting on the side of the bed? : None Difficulty sitting down on and standing up from a chair with arms (e.g., wheelchair, bedside commode, etc,.)?: None Help needed moving to and from a bed to chair (including a wheelchair)?: None Help needed walking in  hospital room?: None Help needed climbing 3-5 steps with a railing? : None 6 Click Score: 24    End of Session   Activity Tolerance: Patient tolerated treatment well Patient left: in bed;with call bell/phone within reach;with family/visitor present Nurse Communication: Mobility status PT Visit Diagnosis: Other abnormalities of gait and mobility (R26.89)    Time: 6122-4497 PT Time Calculation (min) (ACUTE ONLY): 33 min   Charges:   PT Evaluation $PT Eval Moderate Complexity: 1 Mod PT Treatments $Gait Training: 8-22 mins   PT G Codes:        [place], PT, DPT [place] 12/05/2016, 5:19 PM

## 2016-12-05 NOTE — Progress Notes (Signed)
  Echocardiogram 2D Echocardiogram has been performed.  Jodi [name] 12/05/2016, 9:30 AM

## 2016-12-06 LAB — BASIC METABOLIC PANEL
Anion gap: 11 (ref 5–15)
BUN: 37 mg/dL — ABNORMAL HIGH (ref 6–20)
CO2: 29 mmol/L (ref 22–32)
Calcium: 9.3 mg/dL (ref 8.9–10.3)
Chloride: 99 mmol/L — ABNORMAL LOW (ref 101–111)
Creatinine, Ser: 1.51 mg/dL — ABNORMAL HIGH (ref 0.44–1.00)
GFR calc Af Amer: 36 mL/min — ABNORMAL LOW (ref >60)
GFR calc non Af Amer: 31 mL/min — ABNORMAL LOW (ref >60)
Glucose, Bld: 118 mg/dL — ABNORMAL HIGH (ref 65–99)
Potassium: 4.1 mmol/L (ref 3.5–5.1)
Sodium: 139 mmol/L (ref 135–145)

## 2016-12-06 LAB — BRAIN NATRIURETIC PEPTIDE: B Natriuretic Peptide: 699.9 pg/mL — ABNORMAL HIGH (ref 0.0–100.0)

## 2016-12-06 MED ORDER — ISOSORB DINITRATE-HYDRALAZINE 20-37.5 MG PO TABS: 1.0000 | ORAL_TABLET | Freq: Three times a day (TID) | ORAL | 1 refills | Status: DC

## 2016-12-06 MED ORDER — FUROSEMIDE 40 MG PO TABS
40.0000 mg | ORAL_TABLET | Freq: Every morning | ORAL | 1 refills | Status: DC
Start: 2016-12-06 — End: 2018-07-28

## 2016-12-06 MED ORDER — APIXABAN 2.5 MG PO TABS: 2.5000 mg | ORAL_TABLET | Freq: Two times a day (BID) | ORAL | Status: DC

## 2016-12-06 MED ORDER — DILTIAZEM HCL ER COATED BEADS 120 MG PO CP24: 120.0000 mg | ORAL_CAPSULE | Freq: Every day | ORAL | Status: DC

## 2016-12-06 NOTE — Discharge Summary (Signed)
Physician Discharge Summary  Patient ID: Jodi [name] MRN: [id number] DOB/AGE: 1933-02-02 83 y.o.  Admit date: 12/04/2016 Discharge date: 12/06/2016  Admission Diagnoses: Acute on chronic systolic and diastolic heart failure due to valvular heart disease Discharge Diagnoses:  Active Problems:   Acute on chronic diastolic (congestive) heart failure (HCC) 2. Persistent atrial fibrillation CHA2DS2-VASc Score is 5 with yearly risk of stroke of 6.7%. 3. Severe multivalve a heart disease with moderately severe MR and severe aortic stenosis 4. Acute on chronic stage III kidney disease 5. Hypertension 6. CAD of the native vessel and bypass graft: CABG in 1994. Stents in 2002. H/O Redo CABG 06/05/01 consisting of a patent LIMA to the LAD, with free RIMA to RI, SVG to OM1, SVG to PL A, SVG to ramus intermediate branch Dr. [name].  11/24/2013: R + L Heart Cath; Moderate pulmonary hypertension, here pressure 46/18 mmHg. Cardiac output 3.96, index 2.2. SVG to RCA ostial 90%, patent free RIMA to RI, SVG to OM1, LIMA to LAD. Aortic valve area 1.1 cm, Mean gradient of 14 mmHg. Moderate to severe mitral regurgitation, LVEF 40-45% with basal to midinferior akinesis.  Discharged Condition: stable  Hospital Course: Patient presented to the emergency room with marked dyspnea on 12/04/2016, orthopnea and PND.  She was admitted to the hospital with acute heart failure, and diuresed close to 2 L of fluid with improvement in symptoms.  An echocardiogram was obtained on 12/05/2016 revealing severely reduced LVEF.  After long discussion with the family, triglycerides that she preferred to be DO NOT RESUSCITATE, wants to continue aggressive medical therapy only.  After diuresis as symptoms improved, she was felt stable for discharge.  The dosage of Eliquis was reduced due to renal insufficiency, she was started on BiDil 4-year-old t.i.d., not on ACE inhibitor or ARB as patient had renal insufficiency and was also  on small dose of spironolactone which she has tolerated well.  She'll be followed up in the outpatient basis in 10 days on 12/19/2016.  Significant Diagnostic Studies:  ECHO: 12/05/2016: - Left ventricle: The cavity size was mildly dilated. Systolicfunction was moderately to severely reduced. The estimated ejection fraction was in the range of 30% to 35%. Mild diffusehypokinesis. Severe hypokinesis of the entireinferolateral andinferior myocardium. The study is not technically sufficient toallow evaluation of LV diastolic function. - Aortic valve: Moderately calcified annulus. Trileaflet; severelythickened, severely calcified leaflets. Valve mobility wasseverely restricted. There was severe stenosis. Mean gradient(S): 27 mm Hg. Peak gradient (S): 51 mm Hg. Valve area (VTI): 0.6 cm Valve area (Vmax): 0.62 cm.  Valve area (Vmean): 0.59 cm - Mitral valve: Moderately to severely calcified annulus.Moderately calcified leaflets . Mobility of the posterior leafletwas moderately restricted. There was moderate to severeregurgitation directed posteriorly. Valve area by continuityequation (using LVOT flow): 1.0 cm - Left atrium: The atrium was moderately dilated. - Right atrium: The atrium was mildly dilated. - Pulmonary arteries: PA peak pressure: 49 mm Hg (S). The right ventricular systolic pressure was increased consistent with moderate pulmonary hypertension.   Discharge Exam: Blood pressure (!) 142/61, pulse (!) 49, temperature 97.8 F (36.6 C), temperature source Oral, resp. rate 18, height 5\' 3"  (1.6 m), weight 69.5 kg (153 lb 3.2 oz), SpO2 97 %.  Neck: Normal range of motion. Neck supple. JVDpresent.  Cardiovascular:  S1 is muffled, S2 is normal, long systolic murmur heard in the apex, 3 out of 6 in intensity, conducted to the axilla. No gallop or rub. Trace right leg edema present. Respiratory: Clear lung  fields GI: Soft. Bowel sounds are normal. She exhibits no  distension. There is no tenderness. There is no rebound.  Musculoskeletal: Normal range of motion. She exhibits no edemaor tenderness.  Lymphadenopathy: She has no cervical adenopathy.  Neurological: She is alertand oriented to person, place, and time.  Skin: Skin is warmand dry.   Disposition: 01-Home or Self Care   Allergies as of 12/06/2016      Reactions   Sulfamethoxazole-trimethoprim Other (See Comments)   Caused shaking and chills   Tape Other (See Comments)   Tears skin.  Please use "paper" tape      Medication List    STOP taking these medications   amLODipine 10 MG tablet Commonly known as:  NORVASC     TAKE these medications   albuterol 108 (90 Base) MCG/ACT inhaler Commonly known as:  PROVENTIL HFA;VENTOLIN HFA Inhale 1 puff into the lungs every 6 (six) hours as needed for wheezing or shortness of breath.   ALEVE 220 MG tablet Generic drug:  naproxen sodium Take 220 mg by mouth 2 (two) times daily as needed (for pain or headaches).   apixaban 2.5 MG Tabs tablet Commonly known as:  ELIQUIS Take 1 tablet (2.5 mg total) by mouth 2 (two) times daily. Take 1/2 tablet of 5 mg Eliquis twice daily What changed:  medication strength  how much to take  additional instructions   beta carotene w/minerals tablet Take 1 tablet by mouth daily.   CRANBERRY PO Take 2 capsules by mouth daily.   diltiazem 120 MG 24 hr capsule Commonly known as:  CARDIZEM CD Take 1 capsule (120 mg total) by mouth daily.   diltiazem 30 MG tablet Commonly known as:  CARDIZEM Take 1 tablet by mouth every 4 hours for palpitations as needed. What changed:  how much to take  how to take this  when to take this  reasons to take this  additional instructions   furosemide 40 MG tablet Commonly known as:  LASIX Take 1 tablet (40 mg total) by mouth every morning. What changed:  medication strength  how much to take  when to take this  additional instructions    isosorbide-hydrALAZINE 20-37.5 MG tablet Commonly known as:  BIDIL Take 1 tablet by mouth 3 (three) times daily.   metoprolol tartrate 50 MG tablet Commonly known as:  LOPRESSOR Take 50 mg by mouth 2 (two) times daily.   multivitamin with minerals Tabs tablet Take 1 tablet by mouth daily.   rosuvastatin 20 MG tablet Commonly known as:  CRESTOR TAKE 1 TABLET BY MOUTH DAILY What changed:  See the new instructions.   spironolactone 25 MG tablet Commonly known as:  ALDACTONE TAKE 1 TABLET BY MOUTH EVERY DAY. What changed:  See the new instructions.   Vitamin D3 2000 units Tabs Take 2,000 Units by mouth daily.   zolpidem 5 MG tablet Commonly known as:  AMBIEN TAKE 1 TABLET BY MOUTH AT BEDTIME AS NEEDED FOR SLEEP. What changed:  See the new instructions.      Follow-up Information    [name], MD Follow up on 12/19/2016.   Specialty:  Cardiology Why:  To be seen at 1:45 pm. Bring all medications. You will need lab (blood draw) in 10 days at [facility] 3-4 days prior to your visit with me. Contact information: [address] [place] [address] [phone]           Signed: [name], MD, [facility] 12/06/2016, 2:17 PM

## 2016-12-06 NOTE — Plan of Care (Signed)
Problem: Activity: Goal: Risk for activity intolerance will decrease Outcome: Progressing Patient ambulated length of hall.  Tolerated well.

## 2016-12-06 NOTE — Progress Notes (Signed)
SATURATION QUALIFICATIONS: (This note is used to comply with regulatory documentation for home oxygen)  Patient Saturations on Room Air at Rest = 98  Patient Saturations on Room Air while Ambulating = 98-87 back up to the 90's. Lowest patient dropped was 87, however did not last long. Came back up tot he 90's quickly.

## 2016-12-06 NOTE — Progress Notes (Signed)
Patient is stable and ready for discharge.  Reviewed AVS/medications with patient and patient's daughter. Answered all of their questions.

## 2016-12-06 NOTE — Care Management Note (Signed)
Case Management Note  Patient Details  Name: Jodi [name] MRN: [id number] Date of Birth: 1932-07-31  Subjective/Objective:   CHF                Action/Plan: PCP is Family Medicine at [facility]; has private insurance with [employer] [facility] with prescription drug coverage. Lives alone, continues to drive; no needs identified at this time  Expected Discharge Date:  12/06/16               Expected Discharge Plan:  Home/Self Care  In-House Referral:   [facility]  Discharge planning Services  CM Consult     Status of Service:  In process, will continue to follow  Sherrilyn Rist [phone] 12/06/2016, 3:22 PM

## 2016-12-11 ENCOUNTER — Other Ambulatory Visit: Payer: Self-pay

## 2016-12-11 NOTE — Patient Outreach (Signed)
[place] [facility]) Care Management  12/11/2016  ANNEKE CUNDY 1932-04-22 [id number]     EMMI-HF RED ON EMMI ALERT Day # 4 Date: 12/10/16 Red Alert Reason: "Weighed themselves today? No"   Outreach attempt # 1 to patient. Spoke with patient who states she is doing good since discharge home. Reviewed and addressed red alert with patient. She states that she forgets to weigh some times. She did remember to weigh this morning and weight was 151.8 lbs. She voices that her weight does fluctuate at times. She denies any edema and/or SOB. She states she is taking her diuretic as ordered. Patient inquiring about how much fluids she can have per day. Advised patient that usual amount is 1- 1.5ltr per day. RN CM encouraged patient to discuss this with MD at next appt. She voices that she goes to see cardiologist on 12/19/16 and PCP on 12/26/16. She denies any issues with transportation and has supportive family. Patient reports that she has been told to limit and record her sodium intake daily which she is doing. RN CM encouraged patient to also monitor and write down daily weights. Patient voices she has all her meds and understands how and why to take them. She has questions regarding rather or not she should continue to take Lopressor since she is on other BP meds. Lopressor listed on d/c summary as a med to continue. Patient is recording BP daily but she is unable to recall if she checks BP before or after meds. RN CM educated patient on why its important to track when BP taken and rather before or after. Sh voices understanding and will start writing this info down. She will discuss BP meds at MD appt next week. RN CM reviewed with patient s/s of worsening condition and when to seek medical attention. She voiced understanding. No further RN CM needs or concerns at this time. Advised patient that they would continue to get automated EMMI- HF post discharge calls to assess how they are doing  following recent hospitalization and will receive a call from a nurse if any of their responses were abnormal. Patient voiced understanding and was appreciative of f/u call.      Plan: RN CM will notify [facility] administrative assistant of case status.    Enzo Montgomery, RN,BSN,CCM [place] Management Telephonic Care Management Coordinator Direct Phone: [phone] Toll Free: [phone] Fax: [phone]

## 2016-12-13 ENCOUNTER — Other Ambulatory Visit: Payer: Self-pay

## 2016-12-13 NOTE — Patient Outreach (Signed)
[place] [facility]) Care Management  12/13/2016  Jodi [name] [birth year] [id number]     EMMI-HF RED ON EMMI ALERT Day # 6 Date: 12/12/16 Red Alert Reason: "Lost interest in things they used to enjoy? Yes"   Outreach attempt # 1 to patient. Spoke with patient. Reviewed and addressed red alert with patient. She reports that machine misunderstood her response and recorded it inaccurately. She denies any such feelings. She states she has bene doing well overall since return home. Her weight this morning was 150.6 lbs and yesterday was 151. She reports that machine recorded her wrong weight yesterday she told the machine 151 but it recorded 181. She voices that she has been really careful about the foods she eat to limit her sodium intake. She is keeping track of daily sodium amount and voiced she is staying within 500mg . RN CM reviewed with patient normal daily sodium level and HF daily sodium levels. Also discussed s/s of hyponatremia. She states that her son told her the same info and that she needed to be careful not to consume too less salt due to its side effects. Otherwise, patient voices no further RN CM needs or concerns. Advised patient that they would continue to get automated EMMI- HF post discharge calls to assess how they are doing following recent hospitalization and will receive a call from a nurse if any of their responses were abnormal. Patient voiced understanding and was appreciative of f/u call.       Plan: RN CM will notify [facility] administrative assistant of case status.   Enzo Montgomery, RN,BSN,CCM [place] Management Telephonic Care Management Coordinator Direct Phone: [phone] Toll Free: [phone] Fax: [phone]

## 2016-12-17 ENCOUNTER — Other Ambulatory Visit: Payer: Self-pay

## 2016-12-17 DIAGNOSIS — I5032 Chronic diastolic (congestive) heart failure: Secondary | ICD-10-CM | POA: Diagnosis not present

## 2016-12-17 NOTE — Patient Outreach (Signed)
[facility]) Care Management  12/17/2016  Jodi [name] 12-11-1932 [id number]     EMMI-HF RED ON EMMI ALERT Day #  8 Date: 12/14/16 Red Alert Reason: " Went to follow up appt? No"   Red on EMMI dashboard received. No outreach call warranted to patient at this time. RN CM addressed issue on previous call. Patient has PCP appt scheduled for 12/26/16.       Plan: RN CM will notify [facility] administrative assistant of case status.    Enzo Montgomery, RN,BSN,CCM [place] Management Telephonic Care Management Coordinator Direct Phone: [phone] Toll Free: [phone] Fax: [phone]

## 2016-12-19 DIAGNOSIS — K219 Gastro-esophageal reflux disease without esophagitis: Secondary | ICD-10-CM | POA: Diagnosis not present

## 2016-12-19 DIAGNOSIS — I4891 Unspecified atrial fibrillation: Secondary | ICD-10-CM | POA: Diagnosis not present

## 2016-12-19 DIAGNOSIS — I2510 Atherosclerotic heart disease of native coronary artery without angina pectoris: Secondary | ICD-10-CM | POA: Diagnosis not present

## 2016-12-19 DIAGNOSIS — I080 Rheumatic disorders of both mitral and aortic valves: Secondary | ICD-10-CM | POA: Diagnosis not present

## 2016-12-26 ENCOUNTER — Encounter: Payer: Self-pay | Admitting: Family Medicine

## 2016-12-26 ENCOUNTER — Other Ambulatory Visit: Payer: Self-pay

## 2016-12-26 ENCOUNTER — Ambulatory Visit (INDEPENDENT_AMBULATORY_CARE_PROVIDER_SITE_OTHER): Payer: Medicare Other | Admitting: Family Medicine

## 2016-12-26 DIAGNOSIS — I5041 Acute combined systolic (congestive) and diastolic (congestive) heart failure: Secondary | ICD-10-CM

## 2016-12-26 DIAGNOSIS — E7800 Pure hypercholesterolemia, unspecified: Secondary | ICD-10-CM | POA: Diagnosis not present

## 2016-12-26 DIAGNOSIS — I480 Paroxysmal atrial fibrillation: Secondary | ICD-10-CM | POA: Diagnosis not present

## 2016-12-26 NOTE — Assessment & Plan Note (Signed)
Stable on current medications 

## 2016-12-26 NOTE — Assessment & Plan Note (Signed)
Stable

## 2016-12-26 NOTE — Progress Notes (Signed)
Subjective  Patient is presenting with the following illnesses  CHF Follow up from recent hospitalization.  Feels much better. Watching salt intake.  No chest pain or worsening shortness of breath.  Able to do her ADLS.  Medications reviewed.    HYPERLIPIDEMIA Symptoms Chest pain on exertion:  no   Leg claudication:   no Medications (modifying factor): Compliance- crestor daily Right upper quadrant pain- no  Muscle aches- no Duration - years   Timing - continuous  AFIB No symptoms of heart racing or lightheadness or chest pain.  She is unsure if she it taking diltiazem long acting n addition to her as needed 30 mg for palpitations. Taking eliquis regularly.  No bleeding      Component Value Date/Time   CHOL 176 11/09/2015 1127   TRIG 169 (H) 11/09/2015 1127   HDL 53 11/09/2015 1127   LDLDIRECT 79 10/22/2011 1448   VLDL 34 (H) 11/09/2015 1127   CHOLHDL 3.3 11/09/2015 1127      Chief Complaint noted Review of Symptoms - see HPI PMH - Smoking status noted.     Objective Vital Signs reviewed Heart - loud murmur regular rate Lungs:  Normal respiratory effort, chest expands symmetrically. Lungs are clear to auscultation, no crackles or wheezes. Extrem - no edema     Assessments/Plans  Paroxysmal atrial fibrillation (HCC) Stable.  Asked her to check on her diltiazem at home.    Acute combined systolic and diastolic heart failure (HCC) Stable   HYPERCHOLESTEROLEMIA Stable on current medications    See after visit summary for details of patient instuctions

## 2016-12-26 NOTE — Assessment & Plan Note (Signed)
Stable.  Asked her to check on her diltiazem at home.

## 2016-12-26 NOTE — Patient Instructions (Signed)
Good to see you today!  Thanks for coming in.  Please review your medication list to make sure we have everything correct  Call us or Dr Einar Gip if you are swelling any or having chest pain   Have a great holidays with family  Come back in 3 months unless a problem

## 2016-12-28 ENCOUNTER — Telehealth: Payer: Self-pay

## 2016-12-28 NOTE — Patient Outreach (Signed)
Received incoming call on 12/28/16 from patient stating she wished to know longer receive automated EMMI prevent calls. Patient states she is receiving a overwhelming amount of calls daily and the calls are duplicates. Patient also states that she has recently experienced false information being recorded on the EMMI prevent calls which in turn has led to misinformation to clinical staff. Per patients request, calls now deactivated. Call documented for future reference.   [place] Management Assistant

## 2017-01-08 DIAGNOSIS — I080 Rheumatic disorders of both mitral and aortic valves: Secondary | ICD-10-CM | POA: Diagnosis not present

## 2017-01-08 DIAGNOSIS — Z66 Do not resuscitate: Secondary | ICD-10-CM | POA: Diagnosis not present

## 2017-01-08 DIAGNOSIS — I5042 Chronic combined systolic (congestive) and diastolic (congestive) heart failure: Secondary | ICD-10-CM | POA: Diagnosis not present

## 2017-01-08 DIAGNOSIS — I482 Chronic atrial fibrillation: Secondary | ICD-10-CM | POA: Diagnosis not present

## 2017-03-01 DIAGNOSIS — I482 Chronic atrial fibrillation: Secondary | ICD-10-CM | POA: Diagnosis not present

## 2017-03-01 DIAGNOSIS — I080 Rheumatic disorders of both mitral and aortic valves: Secondary | ICD-10-CM | POA: Diagnosis not present

## 2017-03-01 DIAGNOSIS — I5042 Chronic combined systolic (congestive) and diastolic (congestive) heart failure: Secondary | ICD-10-CM | POA: Diagnosis not present

## 2017-03-01 DIAGNOSIS — R0602 Shortness of breath: Secondary | ICD-10-CM | POA: Diagnosis not present

## 2017-03-07 DIAGNOSIS — R0602 Shortness of breath: Secondary | ICD-10-CM | POA: Diagnosis not present

## 2017-03-11 DIAGNOSIS — R0602 Shortness of breath: Secondary | ICD-10-CM | POA: Diagnosis not present

## 2017-03-18 ENCOUNTER — Other Ambulatory Visit: Payer: Self-pay | Admitting: Family Medicine

## 2017-03-29 DIAGNOSIS — I5042 Chronic combined systolic (congestive) and diastolic (congestive) heart failure: Secondary | ICD-10-CM | POA: Diagnosis not present

## 2017-03-29 DIAGNOSIS — R0602 Shortness of breath: Secondary | ICD-10-CM | POA: Diagnosis not present

## 2017-03-29 DIAGNOSIS — I482 Chronic atrial fibrillation: Secondary | ICD-10-CM | POA: Diagnosis not present

## 2017-03-29 DIAGNOSIS — I080 Rheumatic disorders of both mitral and aortic valves: Secondary | ICD-10-CM | POA: Diagnosis not present

## 2017-04-01 ENCOUNTER — Other Ambulatory Visit: Payer: Self-pay | Admitting: Family Medicine

## 2017-05-02 ENCOUNTER — Telehealth: Payer: Self-pay

## 2017-05-02 ENCOUNTER — Ambulatory Visit (INDEPENDENT_AMBULATORY_CARE_PROVIDER_SITE_OTHER): Payer: Medicare Other | Admitting: Family Medicine

## 2017-05-02 VITALS — BP 130/84 | HR 75 | Temp 97.8°F | Wt 160.0 lb

## 2017-05-02 DIAGNOSIS — R300 Dysuria: Secondary | ICD-10-CM

## 2017-05-02 LAB — POCT URINALYSIS DIP (MANUAL ENTRY)
Bilirubin, UA: NEGATIVE
Glucose, UA: NEGATIVE mg/dL
Ketones, POC UA: NEGATIVE mg/dL
Nitrite, UA: NEGATIVE
Protein Ur, POC: 30 mg/dL — AB
Spec Grav, UA: 1.015
Urobilinogen, UA: 0.2 U/dL
pH, UA: 7

## 2017-05-02 LAB — POCT UA - MICROSCOPIC ONLY

## 2017-05-02 MED ORDER — FOSFOMYCIN TROMETHAMINE 3 G PO PACK: 3.0000 g | PACK | Freq: Once | ORAL | Status: DC

## 2017-05-02 MED ORDER — FOSFOMYCIN TROMETHAMINE 3 G PO PACK: 3.0000 g | PACK | Freq: Once | ORAL | 0 refills | Status: AC

## 2017-05-02 NOTE — Telephone Encounter (Signed)
Patient left message that she believes she has a UTI and wants to know if she can get an antibiotic without an office visit. Danley Danker, RN [facility] RN)

## 2017-05-02 NOTE — Telephone Encounter (Signed)
Patient has history of UTIs  Called but did not answer.  Left VM we would call back  Please check with her  As long as she does not have fever or back pain or nausea and vomiting  And feels like she has when she had UTI before then we will call in the antibiotics if she has any of these she would need to be seen

## 2017-05-02 NOTE — Patient Instructions (Addendum)
It was a pleasure to see you today! Thank you for choosing [facility] for your primary care. Jodi [name] was seen for UTI. I am prescribing you, Fosfomycin, an antibiotic for your UTI. This medication is a one time dose that does not interfere with your heart failure medicines. Come back to clinic in a few days if symptoms do not improve, if you develop fever, your pain gets worse. Go to the emergency room if you any of these things happen more severely or if you develop chest pain, dizziness, or any other unusual symptom.   Best,  Marny Lowenstein, MD, MS FAMILY MEDICINE RESIDENT - PGY1 05/02/2017 3:45 PM  Acute Urinary Retention, Female Urinary retention means you are unable to pee completely or at all (empty your bladder). Follow these instructions at home:  Drink enough fluids to keep your pee (urine) clear or pale yellow.  If you are sent home with a tube that drains the bladder (catheter), there will be a drainage bag attached to it. There are two types of bags. One is big that you can wear at night without having to empty it. One is smaller and needs to be emptied more often. ? Keep the drainage bag emptied. ? Keep the drainage bag lower than the tube.  Only take medicine as told by your doctor. Contact a doctor if:  You have a low-grade fever.  You have spasms or you are leaking pee when you have spasms. Get help right away if:  You have chills or a fever.  Your catheter stops draining pee.  Your catheter falls out.  You have increased bleeding that does not stop after you have rested and increased the amount of fluids you had been drinking. This information is not intended to replace advice given to you by your health care provider. Make sure you discuss any questions you have with your health care provider. Document Released: 07/11/2007 Document Revised: 06/30/2015 Document Reviewed: 07/03/2012 Elsevier Interactive Patient Education  2017 [employer].

## 2017-05-02 NOTE — Telephone Encounter (Signed)
Pt called nurse line again really wanting to be seen for UTI. She is increasingly uncomfortable. Pt put on overflow for today. To be here by 3:30- aware of wait.  Wallace Cullens, RN

## 2017-05-02 NOTE — Progress Notes (Signed)
Subjective:  Jodi [name] is a 84 y.o. female who presents to the [facility] today with a chief complaint of dysuria.   HPI:  DYSURIA  Pain urinating started two days ago. Pain is: Every time she goes to the restroom.  She notices pain that she says feels like "electric through my whole body including the arms ". Medications tried: She has had antibiotics in the past but she does not know which ones to treat UTI. Any antibiotics in the last 30 days: None More than 3 UTIs in the last 12 months: No STD exposure: No Possibly pregnant: No  Symptoms Urgency: Yes, every 30 minutes to an hour Frequency: Every time she goes to the restroom Blood in urine: Positive on UA, patient is not noticed any frank blood Pain in back: No Fever: No Vaginal discharge: No Mouth Ulcers: No  Review of Symptoms - see HPI PMH - Smoking status noted.    Objective:  Physical Exam: BP 130/84 (BP Location: Left Arm)   Pulse 75   Temp 97.8 F (36.6 C) (Oral)   Wt 160 lb (72.6 kg)   SpO2 99%   BMI 28.34 kg/m   Gen: NAD, resting comfortably CV: RRR with no murmurs appreciated Pulm: NWOB, CTAB with no crackles, wheezes, or rhonchi GI: Normal bowel sounds present. Soft, Nontender, Nondistended. MSK: no edema, cyanosis, or clubbing noted Skin: warm, dry Neuro: grossly normal, moves all extremities Psych: Normal affect and thought content  Results for orders placed or performed in visit on 05/02/17 (from the past 72 hour(s))  POCT urinalysis dipstick     Status: Abnormal   Collection Time: 05/02/17  3:08 PM  Result Value Ref Range   Color, UA yellow yellow   Clarity, UA cloudy (A) clear   Glucose, UA negative negative mg/dL   Bilirubin, UA negative negative   Ketones, POC UA negative negative mg/dL   Spec Grav, UA 1.015 1.010 - 1.025   Blood, UA large (A) negative   pH, UA 7.0 5.0 - 8.0   Protein Ur, POC =30 (A) negative mg/dL   Urobilinogen, UA 0.2 0.2 or 1.0 E.U./dL   Nitrite, UA  Negative Negative   Leukocytes, UA Small (1+) (A) Negative  POCT UA - Microscopic Only     Status: Abnormal   Collection Time: 05/02/17  3:08 PM  Result Value Ref Range   WBC, Ur, HPF, POC 5-10    RBC, urine, microscopic TOO MANY TO COUNT    Bacteria, U Microscopic TRACE    Epithelial cells, urine per micros 1-5      Assessment/Plan:  Dysuria likely due to acute cystitis.  Likely uncomplicated given that she has no fevers.  UA shows small leukocytes, RBCs, no nitrites, trace bacteria.  Given patient's symptoms we will treat empirically with 1 dose of fosfomycin, as this was not shown to interact with any of her current CHF medications.  Macrobid and Bactrim have been shown to potentiate potassium irregularities.  Patient was returned if symptoms did not get better.    Lab Orders     POCT urinalysis dipstick     POCT UA - Microscopic Only  Meds ordered this encounter  Medications  . DISCONTD: fosfomycin (MONUROL) packet 3 g  . fosfomycin (MONUROL) 3 g PACK    Sig: Take 3 g by mouth once for 1 dose.    Dispense:  3 g    Refill:  0    Marny Lowenstein, MD, MS FAMILY MEDICINE RESIDENT -  PGY1 05/02/2017 5:32 PM

## 2017-05-03 ENCOUNTER — Telehealth: Payer: Self-pay

## 2017-05-03 ENCOUNTER — Other Ambulatory Visit: Payer: Self-pay | Admitting: Family Medicine

## 2017-05-03 MED ORDER — CEPHALEXIN 500 MG PO CAPS: 500.0000 mg | ORAL_CAPSULE | Freq: Two times a day (BID) | ORAL | 0 refills | Status: AC

## 2017-05-03 NOTE — Telephone Encounter (Signed)
Will forward to RN team. Jodi [name]

## 2017-05-03 NOTE — Telephone Encounter (Signed)
Seen by Dr. Grandville Silos yesterday.   Still need to call? Jodi [name], Salome Spotted, CMA

## 2017-05-03 NOTE — Telephone Encounter (Signed)
No need to call Thanks!  Have a good WE

## 2017-05-03 NOTE — Telephone Encounter (Signed)
PA initiated, may be 72 hours before decision is made. Other covered alternatives are Methenamine or Nitrofurantoin. Do you want to change to one of these or wait for answer from PA. Patient called nurse line crying because she has been unable to start medications.  Wallace Cullens, RN

## 2017-05-03 NOTE — Telephone Encounter (Signed)
Received fax from [place] requesting prior authorization of Monurol 3 gm packet.  Sulfamethoxazole/Trimethoprim is preferred.   Do you want to send preferred or attempt a PA on Monurol?  Danley Danker, RN [facility] RN)

## 2017-05-03 NOTE — Telephone Encounter (Signed)
Pt aware rx ready for pick up. Wallace Cullens, RN

## 2017-05-03 NOTE — Progress Notes (Signed)
[place] needs prior auth.  Bactim allergic.  Rx with keflex in uncomplicated UTI dose (reviewed note - no mention of upper track disease.)

## 2017-05-08 DIAGNOSIS — I482 Chronic atrial fibrillation: Secondary | ICD-10-CM | POA: Diagnosis not present

## 2017-05-08 DIAGNOSIS — I080 Rheumatic disorders of both mitral and aortic valves: Secondary | ICD-10-CM | POA: Diagnosis not present

## 2017-05-08 DIAGNOSIS — R0602 Shortness of breath: Secondary | ICD-10-CM | POA: Diagnosis not present

## 2017-05-08 DIAGNOSIS — I5042 Chronic combined systolic (congestive) and diastolic (congestive) heart failure: Secondary | ICD-10-CM | POA: Diagnosis not present

## 2017-05-15 DIAGNOSIS — I5042 Chronic combined systolic (congestive) and diastolic (congestive) heart failure: Secondary | ICD-10-CM | POA: Diagnosis not present

## 2017-06-17 ENCOUNTER — Other Ambulatory Visit: Payer: Self-pay | Admitting: Family Medicine

## 2017-07-10 DIAGNOSIS — I080 Rheumatic disorders of both mitral and aortic valves: Secondary | ICD-10-CM | POA: Diagnosis not present

## 2017-07-10 DIAGNOSIS — I5042 Chronic combined systolic (congestive) and diastolic (congestive) heart failure: Secondary | ICD-10-CM | POA: Diagnosis not present

## 2017-07-10 DIAGNOSIS — R0602 Shortness of breath: Secondary | ICD-10-CM | POA: Diagnosis not present

## 2017-07-10 DIAGNOSIS — I482 Chronic atrial fibrillation: Secondary | ICD-10-CM | POA: Diagnosis not present

## 2017-07-15 ENCOUNTER — Other Ambulatory Visit: Payer: Self-pay | Admitting: Internal Medicine

## 2017-08-05 DIAGNOSIS — R5383 Other fatigue: Secondary | ICD-10-CM | POA: Diagnosis not present

## 2017-08-05 DIAGNOSIS — R0602 Shortness of breath: Secondary | ICD-10-CM | POA: Diagnosis not present

## 2017-08-05 DIAGNOSIS — R5381 Other malaise: Secondary | ICD-10-CM | POA: Diagnosis not present

## 2017-08-05 DIAGNOSIS — I5042 Chronic combined systolic (congestive) and diastolic (congestive) heart failure: Secondary | ICD-10-CM | POA: Diagnosis not present

## 2017-09-02 ENCOUNTER — Other Ambulatory Visit: Payer: Self-pay | Admitting: Family Medicine

## 2017-10-06 IMAGING — CR DG CHEST 2V
2 series · 2 of 2 positions shown · non-contrast
Comparison: 04/15/2015, 05/19/2014 and 02/06/2013

CLINICAL DATA: 82-year-old female with increasing shortness breath.
Hypertension. Nonsmoker. Subsequent encounter.

EXAM:
CHEST  2 VIEW

[chest lat]
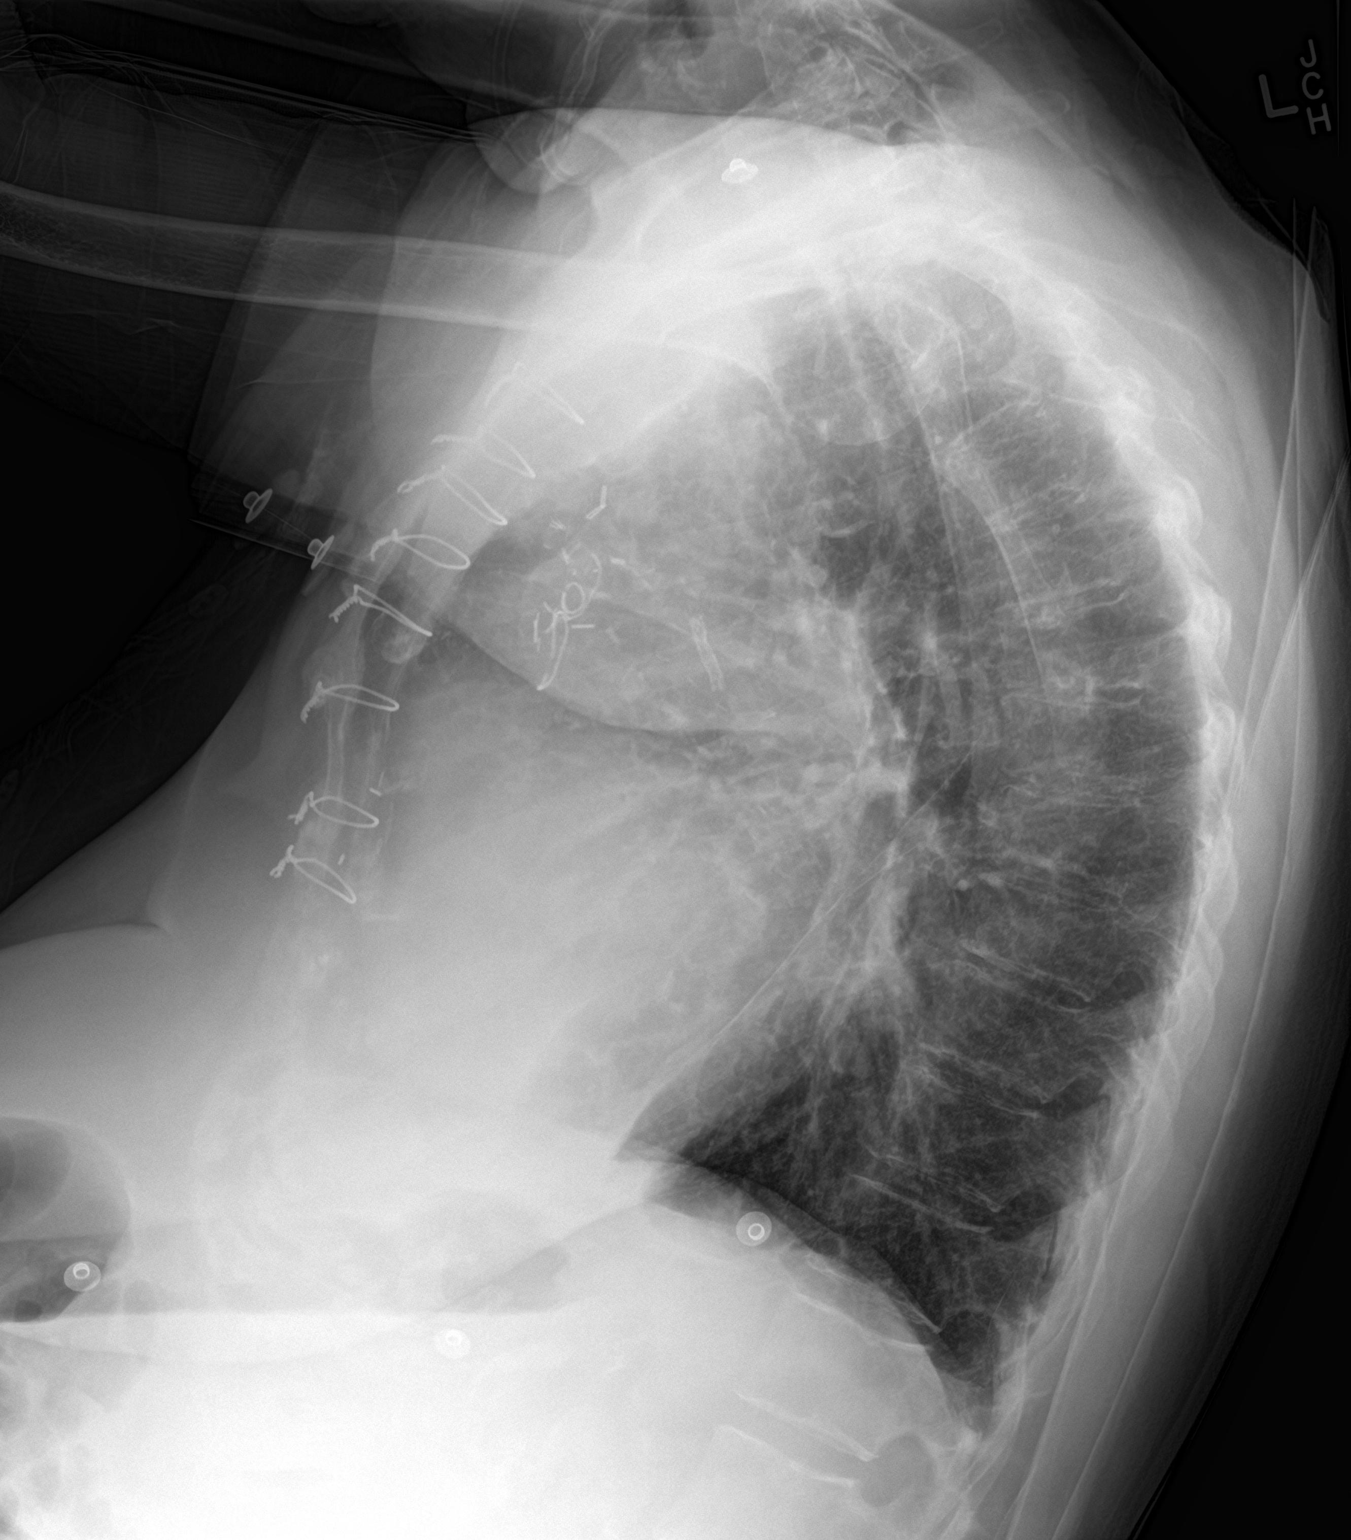

[chest ap]
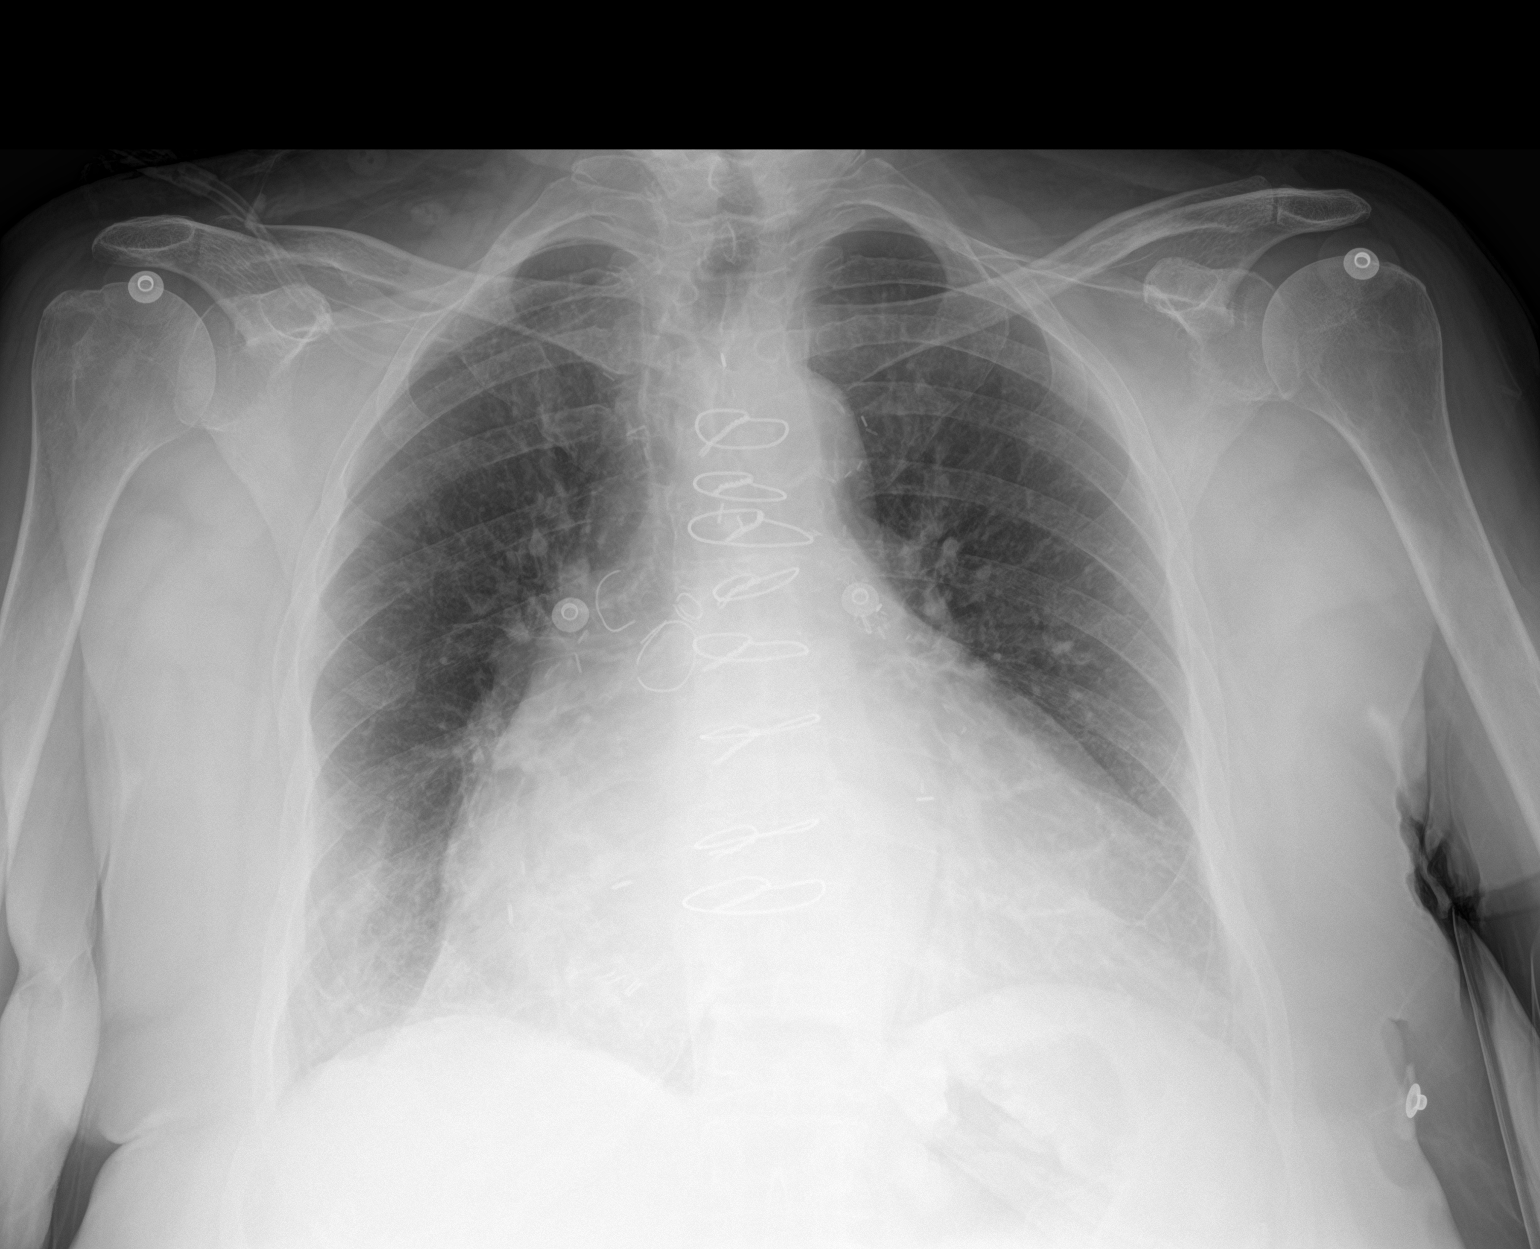

[2 of 2 positions shown; findings below may reference images not displayed]

FINDINGS: Post CABG.  Prominent chronic cardiomegaly.

Calcified tortuous aorta.

Increased lung markings most notable lung bases greater on right
similar to prior exam without segmental consolidation.

Mild pulmonary vascular prominence most notable centrally and stable
in appearance.

Coronary artery calcifications.
IMPRESSION: Chronic prominent cardiomegaly.

Chronic lung markings without segmental consolidation.

Pulmonary vascular prominence most notable centrally stable in
appearance.

Calcified tortuous aorta

## 2017-10-11 DIAGNOSIS — I482 Chronic atrial fibrillation: Secondary | ICD-10-CM | POA: Diagnosis not present

## 2017-10-11 DIAGNOSIS — R5381 Other malaise: Secondary | ICD-10-CM | POA: Diagnosis not present

## 2017-10-11 DIAGNOSIS — R5383 Other fatigue: Secondary | ICD-10-CM | POA: Diagnosis not present

## 2017-10-11 DIAGNOSIS — R0602 Shortness of breath: Secondary | ICD-10-CM | POA: Diagnosis not present

## 2017-10-11 DIAGNOSIS — I5042 Chronic combined systolic (congestive) and diastolic (congestive) heart failure: Secondary | ICD-10-CM | POA: Diagnosis not present

## 2017-11-14 DIAGNOSIS — H353221 Exudative age-related macular degeneration, left eye, with active choroidal neovascularization: Secondary | ICD-10-CM | POA: Diagnosis not present

## 2017-11-14 DIAGNOSIS — H40013 Open angle with borderline findings, low risk, bilateral: Secondary | ICD-10-CM | POA: Diagnosis not present

## 2017-11-14 DIAGNOSIS — H35033 Hypertensive retinopathy, bilateral: Secondary | ICD-10-CM | POA: Diagnosis not present

## 2017-11-14 DIAGNOSIS — H353114 Nonexudative age-related macular degeneration, right eye, advanced atrophic with subfoveal involvement: Secondary | ICD-10-CM | POA: Diagnosis not present

## 2017-11-18 ENCOUNTER — Other Ambulatory Visit: Payer: Self-pay | Admitting: Family Medicine

## 2017-11-22 ENCOUNTER — Other Ambulatory Visit: Payer: Self-pay | Admitting: Pharmacist

## 2017-11-22 NOTE — Patient Outreach (Addendum)
[facility]) Care Management  [place]   11/22/2017  Jodi [name] Aug 02, 1932 [id number]  84 year old female referred to [place] for medication review due to High Risk review of [employer] beneficiaries. PMHx includes, but not limited to, CAD s/p CABG x2, CHF, atrial fibrillation, HTN.    Patient was contacted today to review medications due to multiple [facility] [place] codes (CHF, HTN) and elevated RAF score. HIPAA verifiers identified.   She does note that she has "laser eye surgery" regarding "high pressure" in her eyes upcoming.  Atrial Fibrillation - Patient notes that Dr. Einar Gip reduced her to metoprolol tartrate 50 mg once daily and that she is only taking diltiazem 30 mg PRN palpitations - Confirms that she is taking Eliquis 2.5 mg BID. Notes that she has upcoming eye surgery, but that it is laser surgery and she has not received any instruction about needing to hold blood thinners  Heart Failure - Notes that Bidil was increased to 2 tablets BID and that she is compliant with this - Per chart review, cardiology aware that patient is not on ACE/ARB therapy d/t renal insufficiency - Notes that her breathing is "always bad", and that she has not been using albuterol d/t it not helping  Insomnia - Notes that Dr. Einar Gip told her to increase zolpidem to 10 mg daily. She indicates that she is still having a hard time sleeping and staying asleep, even with zolpidem  Pain Management - Notes that she very rarely uses naproxen for pain; indicates that acetaminophen provides no benefit   Objective: Lab Results  Component Value Date   CREATININE 1.51 (H) 12/06/2016   CREATININE 1.60 (H) 12/05/2016   CREATININE 1.52 (H) 12/04/2016  eGFR 31 mL/min/1.53m; CrCl 27 mL/min  Lab Results  Component Value Date   HGBA1C 6.1 10/02/2016    Lipid Panel     Component Value Date/Time   CHOL 176 11/09/2015 1127   TRIG 169 (H) 11/09/2015 1127   HDL  53 11/09/2015 1127   CHOLHDL 3.3 11/09/2015 1127   VLDL 34 (H) 11/09/2015 1127   LDLCALC 89 11/09/2015 1127   LDLDIRECT 79 10/22/2011 1448    BP Readings from Last 3 Encounters:  05/02/17 130/84  12/26/16 108/72  12/06/16 (!) 142/61    Allergies  Allergen Reactions  . Sulfamethoxazole-Trimethoprim Other (See Comments)    Caused shaking and chills  . Tape Other (See Comments)    Tears skin.  Please use "paper" tape    Medications Reviewed Today    Reviewed [facility]De Hollingshead [facility] on 11/22/17 at 1157  Med List Status: <None>  Medication Order Taking? Sig Documenting Provider Last Dose Status Informant  albuterol (PROVENTIL HFA;VENTOLIN HFA) 108 (90 Base) MCG/ACT inhaler [id number] Inhale 1 puff into the lungs every 6 (six) hours as needed for wheezing or shortness of breath.  Patient not taking:  Reported on 12/26/2016   CLind Covert MD Not Taking Active Self  apixaban (ELIQUIS) 2.5 MG TABS tablet [id number] Take 1 tablet (2.5 mg total) by mouth 2 (two) times daily. Take 1/2 tablet of 5 mg Eliquis twice daily GAdrian Prows MD Taking Active   beta carotene w/minerals (OCUVITE) tablet [id number] Take 1 tablet by mouth 2 (two) times daily.  [provider] Taking Active Self  Cholecalciferol (VITAMIN D3) 2000 UNITS TABS [id number] Take 2,000 Units by mouth daily.  [provider] Taking Active Self  CRANBERRY PO [id number] Take  2 capsules by mouth daily.  [provider] Taking Active Self           Med Note Meda Coffee, Corey Harold Apr 10, 2015  9:45 AM)         Patient not taking:       Discontinued 11/22/17 1157 (Change in therapy)   diltiazem (CARDIZEM) 30 MG tablet [id number] Yes TAKE 1 TABLET BY MOUTH EVERY 4 HOURS FOR PALPITATIONS AS NEEDED. Thompson Grayer, MD Taking Active   furosemide (LASIX) 40 MG tablet [id number] Yes Take 1 tablet (40 mg total) by mouth every morning. [name], MD Taking Active    isosorbide-hydrALAZINE (BIDIL) 20-37.5 MG tablet [id number] Yes Take 1 tablet by mouth 3 (three) times daily. [name], MD Taking Active   metoprolol tartrate (LOPRESSOR) 50 MG tablet [id number] Yes Take 50 mg by mouth 2 (two) times daily. [provider] Taking Active Self           Med Note Darnelle Maffucci, Arville Lime   Fri Nov 22, 2017 11:34 AM) Taking ONCE DAILY in the EVENING  Multiple Vitamin (MULTIVITAMIN WITH MINERALS) TABS tablet [id number] Yes Take 1 tablet by mouth daily. [provider] Taking Active Self  naproxen sodium (ALEVE) 220 MG tablet [id number] No Take 220 mg by mouth 2 (two) times daily as needed (for pain or headaches). [provider] Not Taking Active Self  omeprazole (PRILOSEC) 20 MG capsule [id number] No Take 20 mg by mouth as needed. [provider] Not Taking Active   rosuvastatin (CRESTOR) 20 MG tablet [id number] Yes TAKE 1 TABLET BY MOUTH DAILY Chambliss, Jeb Levering, MD Taking Active   spironolactone (ALDACTONE) 25 MG tablet [id number] Yes TAKE 1 TABLET (25 MG TOTAL) BY MOUTH DAILY. [name], MD Taking Active   zolpidem (AMBIEN) 5 MG tablet [id number] Yes TAKE 1 TABLET BY MOUTH AT BEDTIME AS NEEDED. [name], MD Taking Active            Med Note Darnelle Maffucci, Arville Lime   Fri Nov 22, 2017 11:47 AM) Per patient, Dr. Einar Gip increased to 10 mg PRN           Assessment:  Drugs sorted by system:  Neurologic/Psychologic: zolpidem  Cardiovascular: Eliquis, diltiazem PRN, furosemide, isosorbide-hydralazine, metoprolol tartrate, rosuvastatin, spironolactone  Pulmonary/Allergy: Albuterol HFA  Gastrointestinal: omeprazole PRN  Pain: naproxen (sparingly)  Vitamins/Minerals/Supplements: multivitamin, Ocuvite/AREDS, Vitamin D3, cranberry  Atrial Fibrillation - Contacted Dr. Irven Shelling office to confirm that he intended metoprolol tartrate to be dosed once daily in this patient  Pain Management: - Educated patient that  NSAIDs are typically recommended to be avoided in renal and cardiovascular disease; she notes that she hasn't taken naproxen in a very long time and rarely uses  Insomnia: - It is recommended to avoid increasing zolpidem past 5 mg in females. Patient notes that this was recommended per Dr. Einar Gip; of note, patient has not picked up zolpidem at the pharmacy since 09/02/2017 for 25 tablets of 5 mg.    Plan: - Contacted Dr. Irven Shelling office for metoprolol dosing clarification. Will update medication list as appropriate - Will route message to PCP Dr. Erin Hearing for [place] - Patient has my contact information and can reach out with any questions or concerns

## 2017-11-27 ENCOUNTER — Other Ambulatory Visit: Payer: Self-pay | Admitting: Pharmacist

## 2017-11-27 NOTE — Patient Outreach (Signed)
[facility]) Care Management  11/27/2017  RANIKA MCNIEL Sep 09, 1932 [id number]  Spoke with nurse at Dr. Irven Shelling office. She notes that at last appointment in July, Dr. Einar Gip decreased metoprolol tartrate dosing to 25 mg (1/2 tablet) twice daily - patient has been taking 1 tablet daily, likely only receiving ~12 hours of medication coverage.   Updated medication list to reflect the intended 25 mg BID. Contacted patient, left HIPAA compliant message for her to return my call.   Catie Darnelle Maffucci, PharmD PGY2 Ambulatory Care Pharmacy Resident, [place] [facility] Phone: [phone]

## 2017-11-28 ENCOUNTER — Other Ambulatory Visit: Payer: Self-pay | Admitting: Pharmacist

## 2017-11-28 NOTE — Patient Outreach (Signed)
[facility]) Care Management  11/28/2017  NASRA COUNCE 1932-10-06 [id number]  Patient returned my call. I informed her that Dr. Einar Gip had intended her to take 25 mg (1/2 of 50 mg tablet) twice daily, while she had understood him instructing her to "half her dose" to mean taking 50 mg (1 tablet) daily.   Clarified instructions. Patient expressed understanding. She is going to try splitting the tablets, and if they crumble, she'll let me know so that a new prescription can be sent to her pharmacy for 25 mg tablets.    Plan - Will close case at this time; happy to reopen in the future if needed.  Catie Darnelle Maffucci, PharmD PGY2 Ambulatory Care Pharmacy Resident, [place] [facility] Phone: [phone]

## 2017-12-09 DIAGNOSIS — H40011 Open angle with borderline findings, low risk, right eye: Secondary | ICD-10-CM | POA: Diagnosis not present

## 2017-12-23 DIAGNOSIS — H40013 Open angle with borderline findings, low risk, bilateral: Secondary | ICD-10-CM | POA: Diagnosis not present

## 2017-12-23 DIAGNOSIS — H40052 Ocular hypertension, left eye: Secondary | ICD-10-CM | POA: Diagnosis not present

## 2018-01-10 ENCOUNTER — Telehealth: Payer: Self-pay | Admitting: *Deleted

## 2018-01-10 MED ORDER — CEPHALEXIN 500 MG PO CAPS: 500.0000 mg | ORAL_CAPSULE | Freq: Three times a day (TID) | ORAL | 0 refills | Status: DC

## 2018-01-10 NOTE — Telephone Encounter (Signed)
LMOVM informing pt. Deseree Blount, CMA  

## 2018-01-10 NOTE — Telephone Encounter (Signed)
Pt states that she is having UTI symptoms.  States that is has been going on for 2 - 3 days and burns when she urinates.   Advised that she may need an appointment first (FYI we are booked today and Monday).  She states that Dr. Erin Hearing knows that she knows what a UTI is and "usually" calls in the medicine.  She also states "im still in my gown and I dont drive on [address]"  Will forward to MD to advise. Charlot Gouin, Salome Spotted, CMA

## 2018-01-10 NOTE — Telephone Encounter (Signed)
Please let her know.  I sent in keflex to take three times a day for 5 days  Let us know if she is not better or getting any worse with fever

## 2018-01-13 DIAGNOSIS — I080 Rheumatic disorders of both mitral and aortic valves: Secondary | ICD-10-CM | POA: Diagnosis not present

## 2018-01-13 DIAGNOSIS — R0602 Shortness of breath: Secondary | ICD-10-CM | POA: Diagnosis not present

## 2018-01-13 DIAGNOSIS — I482 Chronic atrial fibrillation, unspecified: Secondary | ICD-10-CM | POA: Diagnosis not present

## 2018-01-13 DIAGNOSIS — I5042 Chronic combined systolic (congestive) and diastolic (congestive) heart failure: Secondary | ICD-10-CM | POA: Diagnosis not present

## 2018-01-27 ENCOUNTER — Ambulatory Visit (INDEPENDENT_AMBULATORY_CARE_PROVIDER_SITE_OTHER): Payer: Medicare Other | Admitting: Family Medicine

## 2018-01-27 ENCOUNTER — Telehealth: Payer: Self-pay

## 2018-01-27 VITALS — BP 112/72 | HR 51 | Temp 97.6°F | Wt 149.0 lb

## 2018-01-27 DIAGNOSIS — R300 Dysuria: Secondary | ICD-10-CM | POA: Diagnosis not present

## 2018-01-27 DIAGNOSIS — N390 Urinary tract infection, site not specified: Secondary | ICD-10-CM

## 2018-01-27 DIAGNOSIS — R911 Solitary pulmonary nodule: Secondary | ICD-10-CM | POA: Diagnosis not present

## 2018-01-27 LAB — POCT URINALYSIS DIP (MANUAL ENTRY)
Bilirubin, UA: NEGATIVE
Glucose, UA: NEGATIVE mg/dL
Ketones, POC UA: NEGATIVE mg/dL
Nitrite, UA: POSITIVE — AB
Protein Ur, POC: 30 mg/dL — AB
Spec Grav, UA: 1.01 (ref 1.010–1.025)
Urobilinogen, UA: 0.2 E.U./dL
pH, UA: 6.5 (ref 5.0–8.0)

## 2018-01-27 LAB — POCT UA - MICROSCOPIC ONLY: RBC, urine, microscopic: >20

## 2018-01-27 MED ORDER — NITROFURANTOIN MONOHYD MACRO 100 MG PO CAPS: 100.0000 mg | ORAL_CAPSULE | Freq: Two times a day (BID) | ORAL | 0 refills | Status: DC

## 2018-01-27 NOTE — Assessment & Plan Note (Signed)
Given that patient has advanced CHF and wishes DNR will not further work up these lung nodules

## 2018-01-27 NOTE — Patient Instructions (Signed)
Good to see you Merry Christmas!  Take the macrobid antibiotics twice a day until all gone - 10 days  You should feel better in 2-3 days.  If not or you have fever or nausea and vomiting or back pain let us know immediately  We will check a urine culture if you are not on the right antibiotics we will call you

## 2018-01-27 NOTE — Assessment & Plan Note (Signed)
Inadequately treated UTI.  Will check culture and start macrobid as she has no systemic symptoms

## 2018-01-27 NOTE — Progress Notes (Signed)
ubjective  Jodi [name] is a 84 y.o. female is presenting with the following  DYSURIA  Pain urinating started 5 days.  Was treated for UTI with keflex first week of Dec.  Symptoms improved but then returned Pain is: burning whenever urinates Medications tried: none Any antibiotics in the last 30 days: yes See above More than 3 UTIs in the last 12 months: yes STD exposure: no Possibly pregnant: no  Symptoms Urgency: yes Frequency: yes Blood in urine: no Pain in back:no Fever: no Vaginal discharge: no Mouth Ulcers: no  Review of Symptoms - see HPI PMH - Smoking status noted.   Last UTI was about 6 months ago    Chief Complaint noted Review of Symptoms - see HPI PMH - Smoking status noted.    Objective Vital Signs reviewed BP 112/72   Pulse (!) 51   Temp 97.6 F (36.4 C) (Oral)   Wt 149 lb (67.6 kg)   SpO2 92%   BMI 26.39 kg/m  Alert nad Psych:  Cognition and judgment appear intact. Alert, communicative  and cooperative with normal attention span and concentration. No apparent delusions, illusions, hallucinations  Assessments/Plans  See after visit summary for details of patient instuctions  Pulmonary nodule Given that patient has advanced CHF and wishes DNR will not further work up these lung nodules  Recurrent UTI Inadequately treated UTI.  Will check culture and start macrobid as she has no systemic symptoms

## 2018-01-27 NOTE — Telephone Encounter (Signed)
Pt double booked this pm. Fleeger, Salome Spotted, [place]

## 2018-01-27 NOTE — Telephone Encounter (Signed)
Pt called LVM on nurse line stating I have another UTI, "it burns when I pee." Pt stated she would like an antibiotic called in, "he usually does this for me."   I called patient back to get more information. I had to LVM. We do not have any apts today and closed the next 2.   Please send antibiotic if able to [facility] on [address].

## 2018-01-27 NOTE — Telephone Encounter (Signed)
Patient needs an office visit and culture since just had antibiotics earlier this month.  Can double book me this afternoon

## 2018-01-29 LAB — URINE CULTURE

## 2018-02-13 ENCOUNTER — Other Ambulatory Visit: Payer: Self-pay

## 2018-02-13 ENCOUNTER — Ambulatory Visit (INDEPENDENT_AMBULATORY_CARE_PROVIDER_SITE_OTHER): Payer: Medicare Other | Admitting: Family Medicine

## 2018-02-13 VITALS — BP 160/72 | HR 61 | Temp 97.9°F | Ht 63.0 in | Wt 155.0 lb

## 2018-02-13 DIAGNOSIS — R05 Cough: Secondary | ICD-10-CM | POA: Diagnosis not present

## 2018-02-13 DIAGNOSIS — Z0000 Encounter for general adult medical examination without abnormal findings: Secondary | ICD-10-CM

## 2018-02-13 DIAGNOSIS — R059 Cough, unspecified: Secondary | ICD-10-CM

## 2018-02-13 DIAGNOSIS — I10 Essential (primary) hypertension: Secondary | ICD-10-CM

## 2018-02-13 NOTE — Patient Instructions (Addendum)
It was a pleasure to see you today! Thank you for choosing [facility] for your primary care. Jodi [name] was seen for viral symptoms.   Today we talked about the viral symptoms even have her last 3 weeks.  We know that you tried some cough syrup and it did not work for you and you do not want to try any more at this point.  As you have not had a fever, your cough is nonproductive, your lungs sound good, and you have only mild fatigue and you are able to hold down food.. We think this is likely a viral illness that your body should get over within time and you appear stable to go home and should not require hospitalization.  You can continue to try over-the-counter cough syrup if you think will help you.  We also talked about the fact that your blood pressure is still uncontrolled.  As you are seeing a cardiologist for heart failure and A. fib we think it is appropriate for you to discuss blood pressure medication changes with them.  You said that you do not want [place] to call and schedule point with Dr. Einar Gip and that you will do it yourself, please make sure that you follow-up on this.  We also discussed that you are overdue for a tetanus shot but as you not feeling well right now he do not want to do it currently at this visit, please speak to your pharmacist about getting this done once you are feeling better.  If you don't hear from [place] in two weeks, please give [place] a call to verify your results. Otherwise, we look forward to seeing you again at your next visit. If you have any questions or concerns before then, please call the clinic at [phone].   Please bring all your medications to every doctors visit   Sign up for My Chart to have easy access to your labs results, and communication with your Primary care physician.     Please check-out at the front desk before leaving the clinic.     Best,  Dr. [name] FAMILY MEDICINE RESIDENT - PGY2 02/13/2018 3:42 PM

## 2018-02-14 ENCOUNTER — Ambulatory Visit: Payer: Medicare Other

## 2018-02-14 DIAGNOSIS — R05 Cough: Secondary | ICD-10-CM | POA: Insufficient documentation

## 2018-02-14 DIAGNOSIS — Z0000 Encounter for general adult medical examination without abnormal findings: Secondary | ICD-10-CM | POA: Insufficient documentation

## 2018-02-14 DIAGNOSIS — R059 Cough, unspecified: Secondary | ICD-10-CM | POA: Insufficient documentation

## 2018-02-14 DIAGNOSIS — Z7189 Other specified counseling: Secondary | ICD-10-CM | POA: Insufficient documentation

## 2018-02-14 NOTE — Assessment & Plan Note (Signed)
Patient was told that they are overdue for Tdap, that Medicare will usually [facility] and they said that they will arrange with her pharmacist.

## 2018-02-14 NOTE — Assessment & Plan Note (Signed)
Patient with uncontrolled hypertension at 160/72.  She is asymptomatic at this point and says that she thinks she is generally around the 150s at home.  We discussed that given her significant cardiac history and the fact that she is following with cardiology already that she should speak to them about the next step in her management.  Patient says that she would prefer to schedule herself with Dr. Einar Gip and does not want assistance with this task.  She understands the need for blood pressure control.

## 2018-02-14 NOTE — Assessment & Plan Note (Signed)
Patient with 2 to 3 weeks complaint of cough.  She at one point did have fevers and muscle aches although those have gone away.  She now only has a mild cough with extremely deep inspiration.  She has had baseline activity level and energy.  She denies any chest pain, shortness of breath, nausea vomiting at this time.  She feels she has been improving but is frustrated with the persistent dry cough that happens a few times per day.  She was taking some over-the-counter cough medicine but it did not completely resolve the issue.  She says she feels well but wants to verify that there is nothing significant happening.  Patient in late recovery phase of likely viral illness seems to be self-limiting, return precautions given

## 2018-02-14 NOTE — Progress Notes (Signed)
    Subjective:  Jodi [name] is a 85 y.o. female who presents to the [facility] today with a chief complaint of upper respiratory illness follow-up.   HPI: Patient with 2 to 3 weeks complaint of cough.  She at one point did have fevers and muscle aches although those have gone away.  She now only has a mild cough with extremely deep inspiration.  She has had baseline activity level and energy.  She denies any chest pain, shortness of breath, nausea vomiting at this time.  She feels she has been improving but is frustrated with the persistent dry cough that happens a few times per day.  She was taking some over-the-counter cough medicine but it did not completely resolve the issue.  She says she feels well but wants to verify that there is nothing significant happening.  Patient in late recovery phase of likely viral illness seems to be self-limiting, return precautions given    Objective:  Physical Exam: BP (!) 160/72   Pulse 61   Temp 97.9 F (36.6 C) (Oral)   Ht 5\' 3"  (1.6 m)   Wt 155 lb (70.3 kg)   SpO2 97%   BMI 27.46 kg/m   Gen: NAD, resting comfortably ENT: No lymphadenopathy, stridor, neck pain,, difficulty swallowing, throat pain CV: RRR with 3/6 murmur appreciated, patient is aware and this is known condition Pulm: NWOB, CTAB with no crackles, wheezes, or rhonchi, despite clear lungs patient did have mild nonproductive cough during deep inspiration MSK: no edema, cyanosis, or clubbing noted Skin: warm, dry Neuro: grossly normal, moves all extremities Psych: Normal affect and thought content  No results found for this or any previous visit (from the past 72 hour(s)).   Assessment/Plan:  Healthcare maintenance Patient was told that they are overdue for Tdap, that [facility] and they said that they will arrange with her pharmacist.  Cough Patient with 2 to 3 weeks complaint of cough.  She at one point did have fevers and muscle aches although  those have gone away.  She now only has a mild cough with extremely deep inspiration.  She has had baseline activity level and energy.  She denies any chest pain, shortness of breath, nausea vomiting at this time.  She feels she has been improving but is frustrated with the persistent dry cough that happens a few times per day.  She was taking some over-the-counter cough medicine but it did not completely resolve the issue.  She says she feels well but wants to verify that there is nothing significant happening.  Patient in late recovery phase of likely viral illness seems to be self-limiting, return precautions given  HYPERTENSION, BENIGN SYSTEMIC Patient with uncontrolled hypertension at 160/72.  She is asymptomatic at this point and says that she thinks she is generally around the 150s at home.  We discussed that given her significant cardiac history and the fact that she is following with cardiology already that she should speak to them about the next step in her management.  Patient says that she would prefer to schedule herself with Dr. Einar Gip and does not want assistance with this task.  She understands the need for blood pressure control.   [name], DO FAMILY MEDICINE RESIDENT - PGY2 02/14/2018 11:49 AM

## 2018-02-20 ENCOUNTER — Telehealth: Payer: Self-pay | Admitting: *Deleted

## 2018-02-20 MED ORDER — CEPHALEXIN 250 MG PO CAPS: 250.0000 mg | ORAL_CAPSULE | Freq: Three times a day (TID) | ORAL | 0 refills | Status: DC

## 2018-02-20 NOTE — Telephone Encounter (Signed)
Recurrent UTI symptoms  Last UC did not grow   Trial of keflex given her many comorbidities and difficulty coming to the office

## 2018-02-20 NOTE — Telephone Encounter (Signed)
Patient called to check status.    She states if the med is called into [facility] before 5 today she can have it delivered. Savahanna Almendariz, Salome Spotted, CMA

## 2018-02-20 NOTE — Telephone Encounter (Signed)
Pt calls because she has "another UTI".  She states that she stopped drinking orange juice and changed to apple juice but is still having the same problem.   Madix Blowe, Salome Spotted, CMA

## 2018-02-24 ENCOUNTER — Other Ambulatory Visit: Payer: Self-pay | Admitting: Family Medicine

## 2018-02-24 ENCOUNTER — Other Ambulatory Visit: Payer: Self-pay | Admitting: Internal Medicine

## 2018-02-25 DIAGNOSIS — H40052 Ocular hypertension, left eye: Secondary | ICD-10-CM | POA: Diagnosis not present

## 2018-02-25 DIAGNOSIS — H40013 Open angle with borderline findings, low risk, bilateral: Secondary | ICD-10-CM | POA: Diagnosis not present

## 2018-03-24 ENCOUNTER — Other Ambulatory Visit: Payer: Self-pay

## 2018-03-24 DIAGNOSIS — G4730 Sleep apnea, unspecified: Secondary | ICD-10-CM

## 2018-03-24 MED ORDER — ZOLPIDEM TARTRATE 5 MG PO TABS: 10.0000 mg | ORAL_TABLET | Freq: Every evening | ORAL | 4 refills | Status: DC | PRN

## 2018-03-26 ENCOUNTER — Other Ambulatory Visit: Payer: Self-pay | Admitting: Family Medicine

## 2018-03-26 DIAGNOSIS — G4730 Sleep apnea, unspecified: Secondary | ICD-10-CM

## 2018-03-28 ENCOUNTER — Other Ambulatory Visit: Payer: Self-pay | Admitting: Cardiology

## 2018-03-28 DIAGNOSIS — F5101 Primary insomnia: Secondary | ICD-10-CM

## 2018-03-28 DIAGNOSIS — G4730 Sleep apnea, unspecified: Secondary | ICD-10-CM

## 2018-03-28 MED ORDER — ZOLPIDEM TARTRATE 5 MG PO TABS: 5.0000 mg | ORAL_TABLET | Freq: Every evening | ORAL | 1 refills | Status: DC | PRN

## 2018-03-28 MED ORDER — ZOLPIDEM TARTRATE 5 MG PO TABS: 10.0000 mg | ORAL_TABLET | Freq: Every evening | ORAL | 2 refills | Status: DC | PRN

## 2018-04-21 ENCOUNTER — Telehealth: Payer: Self-pay

## 2018-04-21 ENCOUNTER — Other Ambulatory Visit: Payer: Self-pay | Admitting: Family Medicine

## 2018-04-21 NOTE — Telephone Encounter (Signed)
Patient wants to know if PCP will prescribe an ear drop for right ear drainage/pain for over 7 days. Does not want to come to office.  Call back is [phone]  Danley Danker, RN [facility])

## 2018-04-22 MED ORDER — CIPROFLOXACIN-HYDROCORTISONE 0.2-1 % OT SUSP: 3.0000 [drp] | Freq: Two times a day (BID) | OTIC | 0 refills | Status: DC

## 2018-04-22 NOTE — Telephone Encounter (Signed)
Attempted to call pt again. No answer/ voicemail set up. Will try again tomorrow. Koda Routon Kennon Holter, CMA

## 2018-04-22 NOTE — Telephone Encounter (Signed)
Please let her know I sent it in  Thanks  [facility]

## 2018-04-22 NOTE — Telephone Encounter (Signed)
Attempted to call patient concerning RX for ear drops. No answer and no voice mail.  Ozella Almond, CMA '

## 2018-04-24 MED ORDER — CIPROFLOXACIN-HYDROCORTISONE 0.2-1 % OT SUSP: 3.0000 [drp] | Freq: Two times a day (BID) | OTIC | 0 refills | Status: DC

## 2018-04-24 NOTE — Addendum Note (Signed)
Addended by: Dorna Bloom on: 04/24/2018 10:39 AM   Modules accepted: Orders

## 2018-04-24 NOTE — Telephone Encounter (Signed)
Attempted to call patient again concerning ear drops. Still no answer and no voice mail.  If patient should happen to call back please inform her that the ear drops have been called into her pharmacy.  Ozella Almond, [place]

## 2018-04-24 NOTE — Telephone Encounter (Signed)
Patient called back to nurse line. Pt needs ear drops to be sent to [facility]. Will resent and cancel [facility] prescription.

## 2018-04-30 ENCOUNTER — Telehealth: Payer: Self-pay | Admitting: Family Medicine

## 2018-04-30 NOTE — Telephone Encounter (Signed)
Called to check  She is doing well Asked to call if needs anythign

## 2018-05-26 ENCOUNTER — Other Ambulatory Visit: Payer: Self-pay | Admitting: Internal Medicine

## 2018-05-26 ENCOUNTER — Other Ambulatory Visit: Payer: Self-pay | Admitting: Cardiology

## 2018-05-26 ENCOUNTER — Other Ambulatory Visit: Payer: Self-pay | Admitting: Family Medicine

## 2018-05-26 ENCOUNTER — Telehealth: Payer: Self-pay

## 2018-05-26 DIAGNOSIS — I480 Paroxysmal atrial fibrillation: Secondary | ICD-10-CM

## 2018-05-26 MED ORDER — METOPROLOL TARTRATE 25 MG PO TABS: 25.0000 mg | ORAL_TABLET | Freq: Two times a day (BID) | ORAL | 1 refills | Status: DC

## 2018-06-09 ENCOUNTER — Telehealth: Payer: Self-pay | Admitting: *Deleted

## 2018-06-09 NOTE — Telephone Encounter (Signed)
Pt called requesting rx for abx for UTI symptoms.  LMOVM for pt to return call.  Will schedule telemedicine visit when call returned. Christen Bame, CMA

## 2018-06-10 ENCOUNTER — Other Ambulatory Visit: Payer: Self-pay

## 2018-06-10 ENCOUNTER — Encounter: Payer: Self-pay | Admitting: Family Medicine

## 2018-06-10 ENCOUNTER — Telehealth (INDEPENDENT_AMBULATORY_CARE_PROVIDER_SITE_OTHER): Payer: Medicare Other | Admitting: Family Medicine

## 2018-06-10 DIAGNOSIS — N390 Urinary tract infection, site not specified: Secondary | ICD-10-CM | POA: Diagnosis not present

## 2018-06-10 MED ORDER — CEPHALEXIN 500 MG PO CAPS: 500.0000 mg | ORAL_CAPSULE | Freq: Three times a day (TID) | ORAL | 0 refills | Status: AC

## 2018-06-10 NOTE — Assessment & Plan Note (Addendum)
I gave her the option of coming in to the lab for urine culture. She does not have a ride and is unable to come in. Her symptoms is convincing for UTI for which she has had multiple episodes. She was last treated with Nitrofurantoin in Dec. To prevent resistant, I will switch to a different regimen. Keflex 250 mg BID for 5 days She is advised to come in soon if there is no improvement for urine culture. She agreed with the plan.

## 2018-06-10 NOTE — Progress Notes (Signed)
Wt: 149.8lb BP:  [facility]

## 2018-06-10 NOTE — Progress Notes (Signed)
[facility] Telemedicine Visit  Patient consented to have virtual visit. Method of visit: Telephone  Encounter participants: Patient: Jodi [name] - located at home Provider: Andrena Mews - located at office Others (if applicable): NA  Chief Complaint: UTI symptoms  HPI:  Dysuria   This is a recurrent problem. Episode onset: Started yesterday morning. The problem occurs every urination. The problem has been gradually worsening. The quality of the pain is described as burning. Pain scale: When she urinates pain is 10/10 in severity. The pain is severe. There has been no fever. She is not sexually active. History of pyelonephritis: She thinks she had kidney infection a very long time again. Associated symptoms include frequency. Pertinent negatives include no discharge, flank pain, hematuria, nausea or vomiting. Associated symptoms comments: Uncertain if there is blood in her urine, but there is a color change. Treatments tried: Nitrofurantoin in Dec. She has used multiple other antibiotic. No use of antibiotics in the last few days. Azo did not help. The treatment provided no relief. Her past medical history is significant for recurrent UTIs.     ROS: per HPI  Pertinent PMHx: Problem list reviewed  Exam:  Respiratory: No distress  Assessment/Plan:  No problem-specific Assessment & Plan notes found for this encounter.    Time spent during visit with patient: 17 minutes

## 2018-06-16 ENCOUNTER — Other Ambulatory Visit: Payer: Self-pay | Admitting: Cardiology

## 2018-06-16 DIAGNOSIS — F5101 Primary insomnia: Secondary | ICD-10-CM

## 2018-06-16 DIAGNOSIS — I5042 Chronic combined systolic (congestive) and diastolic (congestive) heart failure: Secondary | ICD-10-CM

## 2018-07-17 ENCOUNTER — Encounter: Payer: Self-pay | Admitting: Cardiology

## 2018-07-17 ENCOUNTER — Other Ambulatory Visit: Payer: Self-pay

## 2018-07-17 ENCOUNTER — Ambulatory Visit (INDEPENDENT_AMBULATORY_CARE_PROVIDER_SITE_OTHER): Payer: Medicare Other | Admitting: Cardiology

## 2018-07-17 VITALS — BP 128/73 | HR 64 | Ht 63.0 in | Wt 149.0 lb

## 2018-07-17 DIAGNOSIS — I5042 Chronic combined systolic (congestive) and diastolic (congestive) heart failure: Secondary | ICD-10-CM

## 2018-07-17 DIAGNOSIS — I350 Nonrheumatic aortic (valve) stenosis: Secondary | ICD-10-CM | POA: Diagnosis not present

## 2018-07-17 DIAGNOSIS — I340 Nonrheumatic mitral (valve) insufficiency: Secondary | ICD-10-CM | POA: Diagnosis not present

## 2018-07-17 DIAGNOSIS — F5101 Primary insomnia: Secondary | ICD-10-CM

## 2018-07-17 DIAGNOSIS — I2510 Atherosclerotic heart disease of native coronary artery without angina pectoris: Secondary | ICD-10-CM

## 2018-07-17 DIAGNOSIS — I4819 Other persistent atrial fibrillation: Secondary | ICD-10-CM

## 2018-07-17 DIAGNOSIS — I10 Essential (primary) hypertension: Secondary | ICD-10-CM | POA: Diagnosis not present

## 2018-07-17 MED ORDER — ZOLPIDEM TARTRATE 10 MG PO TABS: 10.0000 mg | ORAL_TABLET | Freq: Every evening | ORAL | 3 refills | Status: DC | PRN
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
## 2018-07-28 ENCOUNTER — Other Ambulatory Visit: Payer: Self-pay | Admitting: Cardiology

## 2018-07-28 NOTE — Telephone Encounter (Signed)
Pleaser fill

## 2018-08-04 ENCOUNTER — Other Ambulatory Visit: Payer: Self-pay | Admitting: Family Medicine

## 2018-08-04 DIAGNOSIS — J208 Acute bronchitis due to other specified organisms: Secondary | ICD-10-CM

## 2018-08-21 ENCOUNTER — Telehealth: Payer: Self-pay

## 2018-08-21 ENCOUNTER — Other Ambulatory Visit: Payer: Self-pay

## 2018-08-21 NOTE — Telephone Encounter (Signed)
Pt calling c/o not being able to breathe/sob, chest feeling heavy like someone is sitting on her chest, She has been taking her meds she thinks that it may be her metoprolol being changed to 50mg  BID. Per [place] she is to changed her metoprolol to 25mg  1/2 BID and a extra dose of Lasix

## 2018-08-25 ENCOUNTER — Telehealth: Payer: Self-pay

## 2018-08-25 ENCOUNTER — Emergency Department (HOSPITAL_COMMUNITY): Payer: Medicare Other

## 2018-08-25 ENCOUNTER — Inpatient Hospital Stay (HOSPITAL_COMMUNITY)
Admission: EM | Admit: 2018-08-25 | Discharge: 2018-08-28 | DRG: 291 | Disposition: A | Discharge status: Home Health | Payer: Medicare Other | Attending: Family Medicine | Admitting: Family Medicine

## 2018-08-25 ENCOUNTER — Other Ambulatory Visit: Payer: Self-pay

## 2018-08-25 ENCOUNTER — Encounter (HOSPITAL_COMMUNITY): Payer: Self-pay | Admitting: Emergency Medicine

## 2018-08-25 DIAGNOSIS — R011 Cardiac murmur, unspecified: Secondary | ICD-10-CM | POA: Diagnosis not present

## 2018-08-25 DIAGNOSIS — I272 Pulmonary hypertension, unspecified: Secondary | ICD-10-CM | POA: Diagnosis present

## 2018-08-25 DIAGNOSIS — I080 Rheumatic disorders of both mitral and aortic valves: Secondary | ICD-10-CM | POA: Diagnosis present

## 2018-08-25 DIAGNOSIS — Z85528 Personal history of other malignant neoplasm of kidney: Secondary | ICD-10-CM | POA: Diagnosis not present

## 2018-08-25 DIAGNOSIS — I2510 Atherosclerotic heart disease of native coronary artery without angina pectoris: Secondary | ICD-10-CM | POA: Diagnosis not present

## 2018-08-25 DIAGNOSIS — I4821 Permanent atrial fibrillation: Secondary | ICD-10-CM | POA: Diagnosis present

## 2018-08-25 DIAGNOSIS — Z951 Presence of aortocoronary bypass graft: Secondary | ICD-10-CM

## 2018-08-25 DIAGNOSIS — Z823 Family history of stroke: Secondary | ICD-10-CM

## 2018-08-25 DIAGNOSIS — E1122 Type 2 diabetes mellitus with diabetic chronic kidney disease: Secondary | ICD-10-CM | POA: Diagnosis not present

## 2018-08-25 DIAGNOSIS — I255 Ischemic cardiomyopathy: Secondary | ICD-10-CM | POA: Diagnosis present

## 2018-08-25 DIAGNOSIS — I130 Hypertensive heart and chronic kidney disease with heart failure and stage 1 through stage 4 chronic kidney disease, or unspecified chronic kidney disease: Secondary | ICD-10-CM | POA: Diagnosis not present

## 2018-08-25 DIAGNOSIS — G4700 Insomnia, unspecified: Secondary | ICD-10-CM | POA: Diagnosis present

## 2018-08-25 DIAGNOSIS — E1165 Type 2 diabetes mellitus with hyperglycemia: Secondary | ICD-10-CM | POA: Diagnosis present

## 2018-08-25 DIAGNOSIS — I5043 Acute on chronic combined systolic (congestive) and diastolic (congestive) heart failure: Secondary | ICD-10-CM | POA: Diagnosis present

## 2018-08-25 DIAGNOSIS — K219 Gastro-esophageal reflux disease without esophagitis: Secondary | ICD-10-CM | POA: Diagnosis present

## 2018-08-25 DIAGNOSIS — Z888 Allergy status to other drugs, medicaments and biological substances status: Secondary | ICD-10-CM

## 2018-08-25 DIAGNOSIS — Z7901 Long term (current) use of anticoagulants: Secondary | ICD-10-CM

## 2018-08-25 DIAGNOSIS — E785 Hyperlipidemia, unspecified: Secondary | ICD-10-CM | POA: Diagnosis present

## 2018-08-25 DIAGNOSIS — J40 Bronchitis, not specified as acute or chronic: Secondary | ICD-10-CM | POA: Diagnosis present

## 2018-08-25 DIAGNOSIS — Z8673 Personal history of transient ischemic attack (TIA), and cerebral infarction without residual deficits: Secondary | ICD-10-CM

## 2018-08-25 DIAGNOSIS — Z20828 Contact with and (suspected) exposure to other viral communicable diseases: Secondary | ICD-10-CM | POA: Diagnosis present

## 2018-08-25 DIAGNOSIS — I252 Old myocardial infarction: Secondary | ICD-10-CM | POA: Diagnosis not present

## 2018-08-25 DIAGNOSIS — N179 Acute kidney failure, unspecified: Secondary | ICD-10-CM | POA: Diagnosis not present

## 2018-08-25 DIAGNOSIS — I4891 Unspecified atrial fibrillation: Secondary | ICD-10-CM | POA: Diagnosis not present

## 2018-08-25 DIAGNOSIS — I110 Hypertensive heart disease with heart failure: Secondary | ICD-10-CM | POA: Diagnosis not present

## 2018-08-25 DIAGNOSIS — Z91048 Other nonmedicinal substance allergy status: Secondary | ICD-10-CM

## 2018-08-25 DIAGNOSIS — Z8249 Family history of ischemic heart disease and other diseases of the circulatory system: Secondary | ICD-10-CM

## 2018-08-25 DIAGNOSIS — Z79899 Other long term (current) drug therapy: Secondary | ICD-10-CM

## 2018-08-25 DIAGNOSIS — R0602 Shortness of breath: Secondary | ICD-10-CM | POA: Diagnosis not present

## 2018-08-25 DIAGNOSIS — N184 Chronic kidney disease, stage 4 (severe): Secondary | ICD-10-CM | POA: Diagnosis present

## 2018-08-25 DIAGNOSIS — Z66 Do not resuscitate: Secondary | ICD-10-CM | POA: Diagnosis present

## 2018-08-25 LAB — CBC
HCT: 36.2 % (ref 36.0–46.0)
Hemoglobin: 11.8 g/dL — ABNORMAL LOW (ref 12.0–15.0)
MCH: 30.5 pg (ref 26.0–34.0)
MCHC: 32.6 g/dL (ref 30.0–36.0)
MCV: 93.5 fL (ref 80.0–100.0)
Platelets: 177 10*3/uL (ref 150–400)
RBC: 3.87 MIL/uL (ref 3.87–5.11)
RDW: 13.9 % (ref 11.5–15.5)
WBC: 8.2 10*3/uL (ref 4.0–10.5)
nRBC: 0 % (ref 0.0–0.2)

## 2018-08-25 LAB — BASIC METABOLIC PANEL
Anion gap: 14 (ref 5–15)
BUN: 39 mg/dL — ABNORMAL HIGH (ref 8–23)
CO2: 23 mmol/L (ref 22–32)
Calcium: 10 mg/dL (ref 8.9–10.3)
Chloride: 99 mmol/L (ref 98–111)
Creatinine, Ser: 2.16 mg/dL — ABNORMAL HIGH (ref 0.44–1.00)
GFR calc Af Amer: 24 mL/min — ABNORMAL LOW (ref >60)
GFR calc non Af Amer: 21 mL/min — ABNORMAL LOW (ref >60)
Glucose, Bld: 182 mg/dL — ABNORMAL HIGH (ref 70–99)
Potassium: 4.6 mmol/L (ref 3.5–5.1)
Sodium: 136 mmol/L (ref 135–145)

## 2018-08-25 LAB — TROPONIN I (HIGH SENSITIVITY): Troponin I (High Sensitivity): 43 ng/L — ABNORMAL HIGH (ref <18)

## 2018-08-25 LAB — BRAIN NATRIURETIC PEPTIDE: B Natriuretic Peptide: 1145.6 pg/mL — ABNORMAL HIGH (ref 0.0–100.0)

## 2018-08-25 MED ORDER — ASPIRIN 81 MG PO CHEW
324.0000 mg | CHEWABLE_TABLET | Freq: Once | ORAL | Status: AC
  Administered 2018-08-26: 324 mg via ORAL
  Filled 2018-08-25: qty 4

## 2018-08-25 MED ORDER — FUROSEMIDE 10 MG/ML IJ SOLN
40.0000 mg | Freq: Once | INTRAMUSCULAR | Status: AC
  Administered 2018-08-26: 40 mg via INTRAVENOUS
  Filled 2018-08-25: qty 4

## 2018-08-25 NOTE — ED Provider Notes (Signed)
[facility] EMERGENCY DEPARTMENT Provider Note   CSN: [id number] Arrival date & time: 08/25/18  1259     History   Chief Complaint Chief Complaint  Patient presents with   Shortness of Breath    HPI Jodi [name] is a 85 y.o. female.     Patient is an 85 year old female with a history significant for mitral regurgitation, aortic stenosis, coronary artery disease status post CABG x2, CHF with last EF in 2019 of 30%, chronic kidney disease and paroxysmal atrial fibrillation who is presenting today with worsening shortness of breath.  Patient states she has been chronically short of breath for months to years now that causes her fatigue and shortness of breath with exertion but over the last 4 to 5 days her symptoms have significantly worsened.  This morning she was unable to even walk into the kitchen to make herself something for breakfast and she became extremely short of breath.  The symptoms did not improve with rest and her son called 65.  Patient is also had intermittent pressure-like chest pain but denies having current pain.  Patient did speak with her cardiologist Dr. Einar Gip and increased her Lasix to 80 mg daily 4 days ago but states symptoms are not improving.  She always has some mild swelling in her lower extremities but does not feel that it has significantly worsened.  She denies any productive cough, fever.  She has been around her son-in-law who did have a positive COVID contact but he came back negative.  She denies any abdominal pain, nausea or vomiting.  The history is provided by the patient and a relative.  Shortness of Breath Severity:  Severe Onset quality:  Gradual Duration:  4 days Timing:  Intermittent Progression:  Worsening Chronicity:  Recurrent Context: activity   Relieved by:  Rest and sitting up Worsened by:  Activity Ineffective treatments:  Diuretics Associated symptoms: chest pain   Associated symptoms: no abdominal pain, no  cough, no fever, no sputum production, no vomiting and no wheezing     Past Medical History:  Diagnosis Date   Aortic stenosis    Arthritis    "maybe in my fingers and toes" (09/28/2014)   Bradycardia    CHF (congestive heart failure) (HCC)    Chronic renal insufficiency, stage III (moderate) ([place])    [name] 09/27/2014   Coronary artery disease    Heart murmur    Hyperlipidemia    Hypertension    Macular degeneration, left eye    Myocardial infarction ([place]) 1995; 2003   Paroxysmal atrial fibrillation ([place])    Renal cell carcinoma ([place]) 09/23/2012   Renal mass 12/06/2010   CT Abdomen 11-27-10 upper pole region of the right kidney which is suspicious for solid lesion, measuring 1.9 x 1.3 cm. This lesion is concerning for renal cell carcinoma given the solid appearance.  Following with Dr [name].  Renal bx was benign     Shortness of breath    Splenic infarct 12/06/2010   Stroke [facility]) 2003   "when I had heart surgery"; denies residual on 09/28/2014   TIA (transient ischemic attack) "several"    Patient Active Problem List   Diagnosis Date Noted   Cough 02/14/2018   Healthcare maintenance 02/14/2018   Recurrent UTI 01/27/2018   Acute on chronic diastolic (congestive) heart failure ([place]) 12/04/2016   Thyroid nodule 08/21/2016   Hyperglycemia 11/09/2015   Chronic combined systolic and diastolic congestive heart failure ([place]) 03/04/2015   Left renal mass  Paroxysmal atrial fibrillation ([place]) 08/20/2013   Renal cell carcinoma ([place]) 09/23/2012   Chronic kidney disease (CKD), stage III (moderate) (HCC) 02/20/2012   Aortic stenosis, severe 12/06/2010   SEBORRHEA 11/10/2007   Insomnia 06/09/2007   HYPERCHOLESTEROLEMIA 04/04/2006   HYPERTENSION, BENIGN SYSTEMIC 04/04/2006   CORONARY, ARTERIOSCLEROSIS 04/04/2006   Acute combined systolic and diastolic heart failure ([place]) 04/04/2006   Osteoarthritis involving multiple joints on both sides of body  04/04/2006    Past Surgical History:  Procedure Laterality Date   CARDIAC CATHETERIZATION     CARDIOVERSION N/A 10/20/2013   Procedure: CARDIOVERSION;  Surgeon: Laverda Page, MD;  Location: [facility];  Service: Cardiovascular;  Laterality: N/A;  H&P in file   CARDIOVERSION N/A 05/18/2014   Procedure: CARDIOVERSION;  Surgeon: [name], MD;  Location: [facility] ENDOSCOPY;  Service: Cardiovascular;  Laterality: N/A;   CARDIOVERSION N/A 10/31/2016   Procedure: CARDIOVERSION;  Surgeon: [name], MD;  Location: [facility] ENDOSCOPY;  Service: Cardiovascular;  Laterality: N/A;   CARDIOVERSION N/A 11/20/2016   Procedure: CARDIOVERSION;  Surgeon: [name], MD;  Location: [facility];  Service: Cardiovascular;  Laterality: N/A;   CATARACT EXTRACTION W/ INTRAOCULAR LENS  IMPLANT, BILATERAL Bilateral ~ 2013   CORONARY ANGIOPLASTY WITH STENT PLACEMENT  2002   CORONARY ARTERY BYPASS GRAFT  1995 and 2003   IR RADIOLOGIST EVAL & MGMT  07/03/2016   LEFT AND RIGHT HEART CATHETERIZATION WITH CORONARY ANGIOGRAM N/A 11/24/2013   Procedure: LEFT AND RIGHT HEART CATHETERIZATION WITH CORONARY ANGIOGRAM;  Surgeon: Laverda Page, MD;  Location[facility] C[facility] LAB;  Service: Cardiovascular;  Laterality: N/A;   PERCUTANEOUS NEEDLE BIOPSY OF RENAL LESION  ?2014   TOTAL ABDOMINAL HYSTERECTOMY  1990's   "both ovaries were full of little tiny sores"   TYMPANOPLASTY Bilateral    "had holes in them; still have holes in them"     OB History   No obstetric history on file.      Home Medications    Prior to Admission medications   Medication Sig Start Date End Date Taking? Authorizing Provider  apixaban (ELIQUIS) 2.5 MG TABS tablet Take 1 tablet (2.5 mg total) by mouth 2 (two) times daily. Take 1/2 tablet of 5 mg Eliquis twice daily Patient taking differently: Take 2.5 mg by mouth 2 (two) times daily.  12/06/16   [name], MD  BIDIL 20-37.5 MG tablet TAKE 2 TABLETS BY MOUTH 3 TIMES A DAY 06/16/18   [name], MD  Cholecalciferol (VITAMIN D3) 2000 UNITS TABS Take 2,000 Units by mouth daily.     [provider]  CRANBERRY PO Take 1 capsule by mouth daily.     [provider]  diltiazem (CARDIZEM) 30 MG tablet TAKE 1 TABLET BY MOUTH EVERY 4 HOURS FOR PALPITATIONS AS NEEDED. NEED APPOINTMENT FOR FURTHER REFILL 05/26/18   Allred, Jeneen Rinks, MD  furosemide (LASIX) 40 MG tablet TAKE 1 TABLET BY MOUTH DAILY Patient taking differently: 40 mg 2 (two) times daily.  07/28/18   [name], MD  metoprolol tartrate (LOPRESSOR) 25 MG tablet Take 1 tablet (25 mg total) by mouth 2 (two) times daily. Patient taking differently: Take 12.5 mg by mouth. 1/2 tablet BID 05/26/18   [name], MD  Multiple Vitamin (MULTIVITAMIN WITH MINERALS) TABS tablet Take 1 tablet by mouth daily.    [provider]  Multiple Vitamins-Minerals (PRESERVISION AREDS) CAPS Take by mouth 2 (two) times a day.    [provider]  omeprazole (PRILOSEC) 20 MG capsule Take 20  mg by mouth as needed.    [provider]  PROAIR HFA 108 (90 Base) MCG/ACT inhaler INHALE 1 PUFF INTO THE LUNGS EVERY 6 HOURS AS NEEDED FOR WHEEZING OR SHORTNESS OF BREATH. 08/04/18   [name], MD  rosuvastatin (CRESTOR) 20 MG tablet TAKE 1 TABLET BY MOUTH DAILY 04/22/18   [name], MD  spironolactone (ALDACTONE) 25 MG tablet TAKE 1 TABLET BY MOUTH DAILY. PLEASE SCHEDULE APPOINTMENT 05/26/18   [name], MD  vitamin B-12 (CYANOCOBALAMIN) 1000 MCG tablet Take 1,000 mcg by mouth daily.    [provider]  zolpidem (AMBIEN) 10 MG tablet Take 1 tablet (10 mg total) by mouth at bedtime as needed for up to 30 days for sleep. 07/17/18 08/16/18  [name], MD    Family History Family History  Problem Relation Age of Onset   Coronary artery disease Mother    Stroke Mother    Leukemia Father    Leukemia Sister    Cancer Brother    Coronary artery disease Brother    Heart attack Brother     Heart attack Son    Heart attack Daughter    Myelodysplastic syndrome Sister     Social History Social History   Tobacco Use   Smoking status: Never Smoker   Smokeless tobacco: Never Used  Substance Use Topics   Alcohol use: No   Drug use: No     Allergies   Sulfamethoxazole-trimethoprim and Tape   Review of Systems Review of Systems  Constitutional: Negative for fever.  Respiratory: Positive for shortness of breath. Negative for cough, sputum production and wheezing.   Cardiovascular: Positive for chest pain.  Gastrointestinal: Negative for abdominal pain and vomiting.  All other systems reviewed and are negative.    Physical Exam Updated Vital Signs BP (!) 157/86    Pulse (!) 58    Temp (!) 97.5 F (36.4 C) (Oral)    Resp 14    Ht 5\' 4"  (1.626 m)    Wt 68 kg    LMP  (Exact Date)    SpO2 100%    BMI 25.75 kg/m   Physical Exam Vitals signs and nursing note reviewed.  Constitutional:      General: She is not in acute distress.    Appearance: She is well-developed.  HENT:     Head: Normocephalic and atraumatic.  Eyes:     Pupils: Pupils are equal, round, and reactive to light.  Cardiovascular:     Rate and Rhythm: Normal rate and regular rhythm.     Heart sounds: Normal heart sounds. No murmur. No friction rub.  Pulmonary:     Effort: Pulmonary effort is normal. Tachypnea present.     Breath sounds: Normal breath sounds. No wheezing or rales.  Abdominal:     General: Bowel sounds are normal. There is no distension.     Palpations: Abdomen is soft.     Tenderness: There is no abdominal tenderness. There is no guarding or rebound.  Musculoskeletal: Normal range of motion.        General: No tenderness.     Right lower leg: Edema present.     Left lower leg: Edema present.     Comments: 1+ pitting edema to the ankles bilaterally   Skin:    General: Skin is warm and dry.     Findings: No rash.  Neurological:     General: No focal deficit present.      Mental Status: She  is alert and oriented to person, place, and time.     Cranial Nerves: No cranial nerve deficit.  Psychiatric:        Mood and Affect: Mood normal.        Behavior: Behavior normal.      ED Treatments / Results  Labs (all labs ordered are listed, but only abnormal results are displayed) Labs Reviewed  BASIC METABOLIC PANEL - Abnormal; Notable for the following components:      Result Value   Glucose, Bld 182 (*)    BUN 39 (*)    Creatinine, Ser 2.16 (*)    GFR calc non Af Amer 21 (*)    GFR calc Af Amer 24 (*)    All other components within normal limits  CBC - Abnormal; Notable for the following components:   Hemoglobin 11.8 (*)    All other components within normal limits  SARS CORONAVIRUS 2 (HOSPITAL ORDER, [facility])  BRAIN NATRIURETIC PEPTIDE  TROPONIN I (HIGH SENSITIVITY)    EKG EKG Interpretation  Date/Time:  Monday August 25 2018 13:26:39 EDT Ventricular Rate:  62 PR Interval:    QRS Duration: 118 QT Interval:  440 QTC Calculation: 446 R Axis:   75 Text Interpretation:  Atrial fibrillation with premature ventricular or aberrantly conducted complexes Anterior infarct , age undetermined No significant change since last tracing Confirmed by Blanchie Dessert [phone]) on 08/25/2018 9:28:33 PM   Radiology Dg Chest Portable 1 View  Result Date: 08/25/2018 CLINICAL DATA:  Shortness of breath. EXAM: PORTABLE CHEST 1 VIEW COMPARISON:  12/04/2016 FINDINGS: Marked cardiomegaly, unchanged from prior. Unchanged mediastinal contours. Post median sternotomy. Mild interstitial coarsening and Kerley B-lines suggesting pulmonary edema. Linear scarring in the right mid lung. No confluent airspace disease. No large pleural effusion. No pneumothorax. IMPRESSION: 1. Chronic marked cardiomegaly.  Suggestion of mild pulmonary edema. 2. Right midlung scarring. Electronically Signed   By: Keith Rake M.D.   On: 08/25/2018 20:50     Procedures Procedures (including critical care time)  Medications Ordered in ED Medications  furosemide (LASIX) injection 40 mg (has no administration in time range)  aspirin chewable tablet 324 mg (has no administration in time range)     Initial Impression / Assessment and Plan / ED Course  I have reviewed the triage vital signs and the nursing notes.  Pertinent labs & imaging results that were available during my care of the patient were reviewed by me and considered in my medical decision making (see chart for details).        Elderly female with multiple cardiac issues and known history of CHF presenting today with worsening shortness of breath which is most likely related to exertion.  Patient has had some intermittent chest pressure but denies having pain currently.  She has increased her Lasix to 80 mg in the last 4 days but states the shortness of breath is not improving.  Spoke with patient's daughter and she states that her mom's symptoms have worsened significantly in the last 4 days.  She denies any infectious symptoms and x-ray today shows signs of chronic cardiomegaly and mild pulmonary edema.  Patient's CBC without acute findings and BMP with mild AKI with creatinine of 2.16.  Troponin and BNP are pending.  Suspect patient will need admission for diuresis due to symptomatic CHF exacerbation.  Patient's last echo was approximately 14 months ago with EF of approximately 30%.  Final Clinical Impressions(s) / ED Diagnoses   Final diagnoses:  None    ED Discharge Orders    None       Blanchie Dessert, MD 08/25/18 [phone]

## 2018-08-25 NOTE — ED Triage Notes (Signed)
Pt reports SOB for months and months. Pt reports having an upcoming doctor's appointment but reports she cannot wait.

## 2018-08-25 NOTE — ED Notes (Signed)
Patient reports not eating anything since last night.  Graham crackers and ginger ale provided.  Reports feeling much better after eating.  Daughter called to check on patient.    Updated on wait.

## 2018-08-25 NOTE — Telephone Encounter (Signed)
Pt's daughter called and said her mom is miserable and doesn't feel well. She was going to have her taekn to ER,  BP  is 112/ 50 . Also has SOB,

## 2018-08-25 NOTE — ED Notes (Signed)
[phone]  

## 2018-08-26 ENCOUNTER — Encounter (HOSPITAL_COMMUNITY): Payer: Self-pay

## 2018-08-26 ENCOUNTER — Inpatient Hospital Stay (HOSPITAL_COMMUNITY): Payer: Medicare Other

## 2018-08-26 DIAGNOSIS — Z20828 Contact with and (suspected) exposure to other viral communicable diseases: Secondary | ICD-10-CM | POA: Diagnosis present

## 2018-08-26 DIAGNOSIS — N183 Chronic kidney disease, stage 3 (moderate): Secondary | ICD-10-CM | POA: Diagnosis not present

## 2018-08-26 DIAGNOSIS — N179 Acute kidney failure, unspecified: Secondary | ICD-10-CM

## 2018-08-26 DIAGNOSIS — I255 Ischemic cardiomyopathy: Secondary | ICD-10-CM | POA: Diagnosis not present

## 2018-08-26 DIAGNOSIS — E785 Hyperlipidemia, unspecified: Secondary | ICD-10-CM | POA: Diagnosis present

## 2018-08-26 DIAGNOSIS — I252 Old myocardial infarction: Secondary | ICD-10-CM | POA: Diagnosis not present

## 2018-08-26 DIAGNOSIS — Z951 Presence of aortocoronary bypass graft: Secondary | ICD-10-CM | POA: Diagnosis not present

## 2018-08-26 DIAGNOSIS — J40 Bronchitis, not specified as acute or chronic: Secondary | ICD-10-CM | POA: Diagnosis present

## 2018-08-26 DIAGNOSIS — I482 Chronic atrial fibrillation, unspecified: Secondary | ICD-10-CM | POA: Diagnosis not present

## 2018-08-26 DIAGNOSIS — Z8673 Personal history of transient ischemic attack (TIA), and cerebral infarction without residual deficits: Secondary | ICD-10-CM | POA: Diagnosis not present

## 2018-08-26 DIAGNOSIS — I340 Nonrheumatic mitral (valve) insufficiency: Secondary | ICD-10-CM

## 2018-08-26 DIAGNOSIS — I5043 Acute on chronic combined systolic (congestive) and diastolic (congestive) heart failure: Secondary | ICD-10-CM | POA: Diagnosis not present

## 2018-08-26 DIAGNOSIS — I272 Pulmonary hypertension, unspecified: Secondary | ICD-10-CM | POA: Diagnosis present

## 2018-08-26 DIAGNOSIS — Z85528 Personal history of other malignant neoplasm of kidney: Secondary | ICD-10-CM | POA: Diagnosis not present

## 2018-08-26 DIAGNOSIS — G4700 Insomnia, unspecified: Secondary | ICD-10-CM | POA: Diagnosis present

## 2018-08-26 DIAGNOSIS — Z823 Family history of stroke: Secondary | ICD-10-CM | POA: Diagnosis not present

## 2018-08-26 DIAGNOSIS — E1122 Type 2 diabetes mellitus with diabetic chronic kidney disease: Secondary | ICD-10-CM | POA: Diagnosis present

## 2018-08-26 DIAGNOSIS — I130 Hypertensive heart and chronic kidney disease with heart failure and stage 1 through stage 4 chronic kidney disease, or unspecified chronic kidney disease: Principal | ICD-10-CM

## 2018-08-26 DIAGNOSIS — I4821 Permanent atrial fibrillation: Secondary | ICD-10-CM | POA: Diagnosis present

## 2018-08-26 DIAGNOSIS — I2510 Atherosclerotic heart disease of native coronary artery without angina pectoris: Secondary | ICD-10-CM | POA: Diagnosis not present

## 2018-08-26 DIAGNOSIS — R011 Cardiac murmur, unspecified: Secondary | ICD-10-CM | POA: Diagnosis present

## 2018-08-26 DIAGNOSIS — I080 Rheumatic disorders of both mitral and aortic valves: Secondary | ICD-10-CM | POA: Diagnosis present

## 2018-08-26 DIAGNOSIS — E1165 Type 2 diabetes mellitus with hyperglycemia: Secondary | ICD-10-CM | POA: Diagnosis present

## 2018-08-26 DIAGNOSIS — K219 Gastro-esophageal reflux disease without esophagitis: Secondary | ICD-10-CM | POA: Diagnosis present

## 2018-08-26 DIAGNOSIS — N184 Chronic kidney disease, stage 4 (severe): Secondary | ICD-10-CM | POA: Diagnosis present

## 2018-08-26 DIAGNOSIS — Z66 Do not resuscitate: Secondary | ICD-10-CM | POA: Diagnosis present

## 2018-08-26 DIAGNOSIS — I350 Nonrheumatic aortic (valve) stenosis: Secondary | ICD-10-CM | POA: Diagnosis not present

## 2018-08-26 DIAGNOSIS — Z8249 Family history of ischemic heart disease and other diseases of the circulatory system: Secondary | ICD-10-CM | POA: Diagnosis not present

## 2018-08-26 LAB — BASIC METABOLIC PANEL
Anion gap: 10 (ref 5–15)
BUN: 34 mg/dL — ABNORMAL HIGH (ref 8–23)
CO2: 26 mmol/L (ref 22–32)
Calcium: 9.7 mg/dL (ref 8.9–10.3)
Chloride: 101 mmol/L (ref 98–111)
Creatinine, Ser: 1.77 mg/dL — ABNORMAL HIGH (ref 0.44–1.00)
GFR calc Af Amer: 30 mL/min — ABNORMAL LOW (ref >60)
GFR calc non Af Amer: 26 mL/min — ABNORMAL LOW (ref >60)
Glucose, Bld: 111 mg/dL — ABNORMAL HIGH (ref 70–99)
Potassium: 4 mmol/L (ref 3.5–5.1)
Sodium: 137 mmol/L (ref 135–145)

## 2018-08-26 LAB — ECHOCARDIOGRAM COMPLETE
Height: 63 in
Weight: 2385.6 oz

## 2018-08-26 LAB — TROPONIN I (HIGH SENSITIVITY): Troponin I (High Sensitivity): 45 ng/L — ABNORMAL HIGH (ref <18)

## 2018-08-26 LAB — HEMOGLOBIN A1C
Hgb A1c MFr Bld: 6.4 % — ABNORMAL HIGH (ref 4.8–5.6)
Mean Plasma Glucose: 136.98 mg/dL

## 2018-08-26 LAB — SARS CORONAVIRUS 2 BY RT PCR (HOSPITAL ORDER, PERFORMED IN ~~LOC~~ HOSPITAL LAB): SARS Coronavirus 2: NEGATIVE

## 2018-08-26 MED ORDER — PERFLUTREN LIPID MICROSPHERE
INTRAVENOUS | Status: AC
  Administered 2018-08-26: 3 mL
  Filled 2018-08-26: qty 10

## 2018-08-26 MED ORDER — SODIUM CHLORIDE 0.9 % IV SOLN: 250.0000 mL | INTRAVENOUS | Status: DC | PRN

## 2018-08-26 MED ORDER — SODIUM CHLORIDE 0.9% FLUSH: 3.0000 mL | INTRAVENOUS | Status: DC | PRN

## 2018-08-26 MED ORDER — ALBUTEROL SULFATE (2.5 MG/3ML) 0.083% IN NEBU: 2.5000 mg | INHALATION_SOLUTION | Freq: Four times a day (QID) | RESPIRATORY_TRACT | Status: DC | PRN

## 2018-08-26 MED ORDER — ISOSORB DINITRATE-HYDRALAZINE 20-37.5 MG PO TABS
2.0000 | ORAL_TABLET | Freq: Three times a day (TID) | ORAL | Status: DC
  Administered 2018-08-26 – 2018-08-28 (×7): 2 via ORAL
  Filled 2018-08-26 (×7): qty 2

## 2018-08-26 MED ORDER — ACETAMINOPHEN 325 MG PO TABS: 650.0000 mg | ORAL_TABLET | ORAL | Status: DC | PRN

## 2018-08-26 MED ORDER — ROSUVASTATIN CALCIUM 20 MG PO TABS
20.0000 mg | ORAL_TABLET | Freq: Every day | ORAL | Status: DC
  Administered 2018-08-26 – 2018-08-28 (×3): 20 mg via ORAL
  Filled 2018-08-26 (×3): qty 1

## 2018-08-26 MED ORDER — APIXABAN 2.5 MG PO TABS
2.5000 mg | ORAL_TABLET | Freq: Two times a day (BID) | ORAL | Status: DC
  Administered 2018-08-26 – 2018-08-28 (×5): 2.5 mg via ORAL
  Filled 2018-08-26 (×5): qty 1

## 2018-08-26 MED ORDER — FUROSEMIDE 10 MG/ML IJ SOLN
INTRAMUSCULAR | Status: AC
  Administered 2018-08-26: 40 mg via INTRAVENOUS
  Filled 2018-08-26: qty 4

## 2018-08-26 MED ORDER — SODIUM CHLORIDE 0.9% FLUSH
3.0000 mL | Freq: Two times a day (BID) | INTRAVENOUS | Status: DC
  Administered 2018-08-26 – 2018-08-28 (×5): 3 mL via INTRAVENOUS

## 2018-08-26 MED ORDER — ONDANSETRON HCL 4 MG/2ML IJ SOLN: 4.0000 mg | Freq: Four times a day (QID) | INTRAMUSCULAR | Status: DC | PRN

## 2018-08-26 MED ORDER — ALBUTEROL SULFATE HFA 108 (90 BASE) MCG/ACT IN AERS
1.0000 | INHALATION_SPRAY | Freq: Four times a day (QID) | RESPIRATORY_TRACT | Status: DC | PRN
  Filled 2018-08-26: qty 6.7

## 2018-08-26 MED ORDER — METOPROLOL TARTRATE 12.5 MG HALF TABLET
12.5000 mg | ORAL_TABLET | Freq: Two times a day (BID) | ORAL | Status: DC
  Administered 2018-08-26 – 2018-08-28 (×5): 12.5 mg via ORAL
  Filled 2018-08-26 (×5): qty 1

## 2018-08-26 MED ORDER — FUROSEMIDE 10 MG/ML IJ SOLN
40.0000 mg | Freq: Two times a day (BID) | INTRAMUSCULAR | Status: DC
  Administered 2018-08-26 – 2018-08-27 (×3): 40 mg via INTRAVENOUS
  Filled 2018-08-26 (×2): qty 4

## 2018-08-26 NOTE — Progress Notes (Signed)
  Echocardiogram 2D Echocardiogram has been performed.  Jannett Celestine 08/26/2018, 2:38 PM

## 2018-08-26 NOTE — ED Notes (Signed)
Ordered bfast 

## 2018-08-26 NOTE — Evaluation (Signed)
Physical Therapy Evaluation Patient Details Name: Jodi [name] MRN: [id number] DOB: July 21, 1932 Today's Date: 08/26/2018   History of Present Illness  Pt is an 85 y/o female presenting with progressive shortness of breath over the past few days, with associated orthopnea and LE edema, likely secondary to with acute on chronic heart failure. PMH is significant for chronic CHF, HLD, HTN, CAD, CABG X 2, A fib, Aortic stenosis, mitral regurg and CKD.  Clinical Impression  Pt admitted secondary to problem above with deficits below. Pt requiring min guard A and HHA to perform gait tasks. Pt with increased SOB, however, oxygen sats >90% on RA throughout. Educated about energy conservation techniques. Discussed use of rollator, however, pt currently refusing. Will continue to follow acutely to maximize functional mobility independence and safety.     Follow Up Recommendations Home health PT    Equipment Recommendations  Other (comment)(pt refusing rollator)    Recommendations for Other Services       Precautions / Restrictions Precautions Precautions: None Restrictions Weight Bearing Restrictions: No      Mobility  Bed Mobility               General bed mobility comments: Sitting at EOB   Transfers Overall transfer level: Needs assistance Equipment used: None Transfers: Sit to/from Stand Sit to Stand: Min guard         General transfer comment: Min guard for safety.   Ambulation/Gait Ambulation/Gait assistance: Min guard Gait Distance (Feet): 120 Feet Assistive device: 1 person hand held assist Gait Pattern/deviations: Step-through pattern;Decreased stride length Gait velocity: Decreased   General Gait Details: Slow, cautious gait. Increased SOB, however, oxygen sats >90% throughout gait. Mild unsteadiness requiring min guard A and HHA.   Stairs            Wheelchair Mobility    Modified Rankin (Stroke Patients Only)       Balance Overall balance  assessment: Needs assistance Sitting-balance support: No upper extremity supported;Feet supported Sitting balance-Leahy Scale: Good     Standing balance support: During functional activity;Single extremity supported;No upper extremity supported Standing balance-Leahy Scale: Fair Standing balance comment: Able to maintain static standing without UE support                              Pertinent Vitals/Pain Pain Assessment: No/denies pain    Home Living Family/patient expects to be discharged to:: Private residence Living Arrangements: Alone Available Help at Discharge: Family;Available PRN/intermittently Type of Home: Apartment Home Access: Stairs to enter Entrance Stairs-Rails: None Entrance Stairs-Number of Steps: 2 Home Layout: One level Home Equipment: Cane - single point;Grab bars - tub/shower      Prior Function Level of Independence: Independent with assistive device(s)         Comments: Reports using cane for ambulation.      Hand Dominance        Extremity/Trunk Assessment   Upper Extremity Assessment Upper Extremity Assessment: Defer to OT evaluation    Lower Extremity Assessment Lower Extremity Assessment: Generalized weakness    Cervical / Trunk Assessment Cervical / Trunk Assessment: Normal  Communication   Communication: No difficulties  Cognition Arousal/Alertness: Awake/alert Behavior During Therapy: WFL for tasks assessed/performed Overall Cognitive Status: Within Functional Limits for tasks assessed  General Comments General comments (skin integrity, edema, etc.): Educated about energy conservation techniques, including taking rest breaks and sitting down while showering.     Exercises     Assessment/Plan    PT Assessment Patient needs continued PT services  PT Problem List Cardiopulmonary status limiting activity;Decreased strength;Decreased mobility;Decreased  activity tolerance;Decreased balance;Decreased knowledge of use of DME       PT Treatment Interventions DME instruction;Gait training;Stair training;Functional mobility training;Therapeutic activities;Therapeutic exercise;Balance training;Patient/family education    PT Goals (Current goals can be found in the Care Plan section)  Acute Rehab PT Goals Patient Stated Goal: to be able to breathe better PT Goal Formulation: With patient Time For Goal Achievement: 09/09/18 Potential to Achieve Goals: Good    Frequency Min 3X/week   Barriers to discharge        Co-evaluation               AM-PAC PT "6 Clicks" Mobility  Outcome Measure Help needed turning from your back to your side while in a flat bed without using bedrails?: None Help needed moving from lying on your back to sitting on the side of a flat bed without using bedrails?: None Help needed moving to and from a bed to a chair (including a wheelchair)?: A Little Help needed standing up from a chair using your arms (e.g., wheelchair or bedside chair)?: A Little Help needed to walk in hospital room?: A Little Help needed climbing 3-5 steps with a railing? : A Little 6 Click Score: 20    End of Session Equipment Utilized During Treatment: Gait belt Activity Tolerance: Patient tolerated treatment well Patient left: in bed;with call bell/phone within reach(sitting EOB ) Nurse Communication: Mobility status PT Visit Diagnosis: Other abnormalities of gait and mobility (R26.89);Muscle weakness (generalized) (M62.81)    Time: 1435-1450 PT Time Calculation (min) (ACUTE ONLY): 15 min   Charges:   PT Evaluation $PT Eval Low Complexity: [place], PT, DPT  Acute Rehabilitation Services  Pager: [phone] Office: [phone]   Rudean Hitt 08/26/2018, 4:22 PM

## 2018-08-26 NOTE — ED Notes (Signed)
Admitting physicians at bedside.

## 2018-08-26 NOTE — ED Notes (Addendum)
ED TO INPATIENT HANDOFF REPORT  ED Nurse Name and Phone #: Thurmond Butts [place] Name/Age/Gender Jodi [name] 85 y.o. female Room/Bed: 039C/039C  Code Status   Code Status: DNR  Home/SNF/Other Home Patient oriented to: A&Ox4 Is this baseline? Yes   Triage Complete: Triage complete  Chief Complaint Chronic SOB  Triage Note Pt reports SOB for months and months. Pt reports having an upcoming doctor's appointment but reports she cannot wait.    Allergies Allergies  Allergen Reactions  . Sulfamethoxazole-Trimethoprim Other (See Comments)    Caused shaking and chills  . Tape Other (See Comments)    Tears skin.  Please use "paper" tape    Level of Care/Admitting Diagnosis ED Disposition    ED Disposition Condition [place]: [place] [[id number]]  Level of Care: Telemetry Cardiac [103]  Covid Evaluation: Confirmed COVID Negative  Diagnosis: Acute on chronic combined systolic and diastolic CHF (congestive heart failure) [facility]) [[id number]]  Admitting Physician: Patriciaann Clan [[id number]]  Attending Physician: Martyn Malay [[id number]]  Estimated length of stay: 3 - 4 days  Certification:: I certify this patient will need inpatient services for at least 2 midnights  PT Class (Do Not Modify): Inpatient [101]  PT Acc Code (Do Not Modify): Private [1]       B Medical/Surgery History Past Medical History:  Diagnosis Date  . Aortic stenosis   . Arthritis    "maybe in my fingers and toes" (09/28/2014)  . Bradycardia   . CHF (congestive heart failure) ([place])   . Chronic renal insufficiency, stage III (moderate) ([place])    [name] 09/27/2014  . Coronary artery disease   . Heart murmur   . Hyperlipidemia   . Hypertension   . Macular degeneration, left eye   . Myocardial infarction [facility]) 1995; 2003  . Paroxysmal atrial fibrillation (HCC)   . Renal cell carcinoma ([place]) 09/23/2012  . Renal mass 12/06/2010   CT Abdomen 11-27-10 upper pole  region of the right kidney which is suspicious for solid lesion, measuring 1.9 x 1.3 cm. This lesion is concerning for renal cell carcinoma given the solid appearance.  Following with Dr [name].  Renal bx was benign    . Shortness of breath   . Splenic infarct 12/06/2010  . Stroke [facility]) 2003   "when I had heart surgery"; denies residual on 09/28/2014  . TIA (transient ischemic attack) "several"   Past Surgical History:  Procedure Laterality Date  . CARDIAC CATHETERIZATION    . CARDIOVERSION N/A 10/20/2013   Procedure: CARDIOVERSION;  Surgeon: Laverda Page, MD;  Location: [place];  Service: Cardiovascular;  Laterality: N/A;  H&P in file  . CARDIOVERSION N/A 05/18/2014   Procedure: CARDIOVERSION;  Surgeon: [name], MD;  Location: [facility] ENDOSCOPY;  Service: Cardiovascular;  Laterality: N/A;  . CARDIOVERSION N/A 10/31/2016   Procedure: CARDIOVERSION;  Surgeon: [name], MD;  Location: [facility] ENDOSCOPY;  Service: Cardiovascular;  Laterality: N/A;  . CARDIOVERSION N/A 11/20/2016   Procedure: CARDIOVERSION;  Surgeon: [name], MD;  Location: [place];  Service: Cardiovascular;  Laterality: N/A;  . CATARACT EXTRACTION W/ INTRAOCULAR LENS  IMPLANT, BILATERAL Bilateral ~ 2013  . CORONARY ANGIOPLASTY WITH STENT PLACEMENT  2002  . CORONARY ARTERY BYPASS GRAFT  1995 and 2003  . IR RADIOLOGIST EVAL & MGMT  07/03/2016  . LEFT AND RIGHT HEART CATHETERIZATION WITH CORONARY ANGIOGRAM N/A 11/24/2013   Procedure: LEFT AND RIGHT HEART CATHETERIZATION WITH CORONARY ANGIOGRAM;  Surgeon: Cammy Brochure  Carlynn Herald, MD;  Location: [place] CATH LAB;  Service: Cardiovascular;  Laterality: N/A;  . PERCUTANEOUS NEEDLE BIOPSY OF RENAL LESION  ?2014  . TOTAL ABDOMINAL HYSTERECTOMY  1990's   "both ovaries were full of little tiny sores"  . TYMPANOPLASTY Bilateral    "had holes in them; still have holes in them"     A IV Location/Drains/Wounds Patient Lines/Drains/Airways Status   Active Line/Drains/Airways    Name:    Placement date:   Placement time:   Site:   Days:   Peripheral IV 08/25/18 Right Antecubital   08/25/18    2308    Antecubital   1   External Urinary Catheter   08/26/18    0133    -   less than 1          Intake/Output Last 24 hours No intake or output data in the 24 hours ending 08/26/18 0805  Labs/Imaging Results for orders placed or performed during the hospital encounter of 08/25/18 (from the past 48 hour(s))  Basic metabolic panel     Status: Abnormal   Collection Time: 08/25/18  1:47 PM  Result Value Ref Range   Sodium 136 135 - 145 mmol/L   Potassium 4.6 3.5 - 5.1 mmol/L   Chloride 99 98 - 111 mmol/L   CO2 23 22 - 32 mmol/L   Glucose, Bld 182 (H) 70 - 99 mg/dL   BUN 39 (H) 8 - 23 mg/dL   Creatinine, Ser 2.16 (H) 0.44 - 1.00 mg/dL   Calcium 10.0 8.9 - 10.3 mg/dL   GFR calc non Af Amer 21 (L) >60 mL/min   GFR calc Af Amer 24 (L) >60 mL/min   Anion gap 14 5 - 15    Comment: Performed at [facility], [address] [place], [place] [address]  CBC     Status: Abnormal   Collection Time: 08/25/18  1:47 PM  Result Value Ref Range   WBC 8.2 4.0 - 10.5 K/uL   RBC 3.87 3.87 - 5.11 MIL/uL   Hemoglobin 11.8 (L) 12.0 - 15.0 g/dL   HCT 36.2 36.0 - 46.0 %   MCV 93.5 80.0 - 100.0 fL   MCH 30.5 26.0 - 34.0 pg   MCHC 32.6 30.0 - 36.0 g/dL   RDW 13.9 11.5 - 15.5 %   Platelets 177 150 - 400 K/uL   nRBC 0.0 0.0 - 0.2 %    Comment: Performed at [facility], [place] [address] [place], [place] [address]  Troponin I (High Sensitivity)     Status: Abnormal   Collection Time: 08/25/18 10:44 PM  Result Value Ref Range   Troponin I (High Sensitivity) 43 (H) <18 ng/L    Comment: (NOTE) Elevated high sensitivity troponin I (hsTnI) values and significant  changes across serial measurements may suggest ACS but many other  chronic and acute conditions are known to elevate hsTnI results.  Refer to the "Links" section for chest pain algorithms and additional  guidance. Performed  at [facility], [place] [address] [place], [place] [address]   Brain natriuretic peptide     Status: Abnormal   Collection Time: 08/25/18 10:44 PM  Result Value Ref Range   B Natriuretic Peptide 1,145.6 (H) 0.0 - 100.0 pg/mL    Comment: Performed at [facility] [address], [place], [place] [address]  SARS Coronavirus 2 (CEPHEID - Performed in [place] hospital lab), Hosp Order     Status:  None   Collection Time: 08/25/18 11:18 PM   Specimen: Nasopharyngeal Swab  Result Value Ref Range   SARS Coronavirus 2 NEGATIVE NEGATIVE    Comment: (NOTE) If result is NEGATIVE SARS-CoV-2 target nucleic acids are NOT DETECTED. The SARS-CoV-2 RNA is generally detectable in upper and lower  respiratory specimens during the acute phase of infection. The lowest  concentration of SARS-CoV-2 viral copies this assay can detect is 250  copies / mL. A negative result does not preclude SARS-CoV-2 infection  and should not be used as the sole basis for treatment or other  patient management decisions.  A negative result may occur with  improper specimen collection / handling, submission of specimen other  than nasopharyngeal swab, presence of viral mutation(s) within the  areas targeted by this assay, and inadequate number of viral copies  (<250 copies / mL). A negative result must be combined with clinical  observations, patient history, and epidemiological information. If result is POSITIVE SARS-CoV-2 target nucleic acids are DETECTED. The SARS-CoV-2 RNA is generally detectable in upper and lower  respiratory specimens dur ing the acute phase of infection.  Positive  results are indicative of active infection with SARS-CoV-2.  Clinical  correlation with patient history and other diagnostic information is  necessary to determine patient infection status.  Positive results do  not rule out bacterial infection or co-infection with other viruses. If result is PRESUMPTIVE  POSTIVE SARS-CoV-2 nucleic acids MAY BE PRESENT.   A presumptive positive result was obtained on the submitted specimen  and confirmed on repeat testing.  While 2019 novel coronavirus  (SARS-CoV-2) nucleic acids may be present in the submitted sample  additional confirmatory testing may be necessary for epidemiological  and / or clinical management purposes  to differentiate between  SARS-CoV-2 and other Sarbecovirus currently known to infect humans.  If clinically indicated additional testing with an alternate test  methodology [phone]) is advised. The SARS-CoV-2 RNA is generally  detectable in upper and lower respiratory sp ecimens during the acute  phase of infection. The expected result is Negative. Fact Sheet for Patients:  StrictlyIdeas.no Fact Sheet for Healthcare Providers: BankingDealers.co.za This test is not yet approved or cleared by the [place] FDA and has been authorized for detection and/or diagnosis of SARS-CoV-2 by FDA under an Emergency Use Authorization (EUA).  This EUA will remain in effect (meaning this test can be used) for the duration of the COVID-19 declaration under Section 564(b)(1) of the Act, 21 U.S.C. section 360bbb-3(b)(1), unless the authorization is terminated or revoked sooner. Performed at [facility], [place] [address] [place], [place] [address]   Troponin I (High Sensitivity)     Status: Abnormal   Collection Time: 08/26/18 12:39 AM  Result Value Ref Range   Troponin I (High Sensitivity) 45 (H) <18 ng/L    Comment: (NOTE) Elevated high sensitivity troponin I (hsTnI) values and significant  changes across serial measurements may suggest ACS but many other  chronic and acute conditions are known to elevate hsTnI results.  Refer to the "Links" section for chest pain algorithms and additional  guidance. Performed at [facility], [place] [address] [place], [place] [address]    Hemoglobin A1c     Status: Abnormal   Collection Time: 08/26/18  4:03 AM  Result Value Ref Range   Hgb A1c MFr Bld 6.4 (H) 4.8 - 5.6 %    Comment: (NOTE) Pre diabetes:          5.7%-6.4% Diabetes:              >  6.4% Glycemic control for   <7.0% adults with diabetes    Mean Plasma Glucose 136.98 mg/dL    Comment: Performed at [facility] [address] [place], [place] [address]  Basic metabolic panel     Status: Abnormal   Collection Time: 08/26/18  6:18 AM  Result Value Ref Range   Sodium 137 135 - 145 mmol/L   Potassium 4.0 3.5 - 5.1 mmol/L   Chloride 101 98 - 111 mmol/L   CO2 26 22 - 32 mmol/L   Glucose, Bld 111 (H) 70 - 99 mg/dL   BUN 34 (H) 8 - 23 mg/dL   Creatinine, Ser 1.77 (H) 0.44 - 1.00 mg/dL   Calcium 9.7 8.9 - 10.3 mg/dL   GFR calc non Af Amer 26 (L) >60 mL/min   GFR calc Af Amer 30 (L) >60 mL/min   Anion gap 10 5 - 15    Comment: Performed at [facility], [place] [address] [place], [place] [address]   Dg Chest Portable 1 View  Result Date: 08/25/2018 CLINICAL DATA:  Shortness of breath. EXAM: PORTABLE CHEST 1 VIEW COMPARISON:  12/04/2016 FINDINGS: Marked cardiomegaly, unchanged from prior. Unchanged mediastinal contours. Post median sternotomy. Mild interstitial coarsening and Kerley B-lines suggesting pulmonary edema. Linear scarring in the right mid lung. No confluent airspace disease. No large pleural effusion. No pneumothorax. IMPRESSION: 1. Chronic marked cardiomegaly.  Suggestion of mild pulmonary edema. 2. Right midlung scarring. Electronically Signed   By: Keith Rake M.D.   On: 08/25/2018 20:50    Pending Labs Unresulted Labs (From admission, onward)    Start     Ordered   08/27/18 9379  Basic metabolic panel  Daily,   R     08/26/18 0525          Vitals/Pain Today's Vitals   08/26/18 0515 08/26/18 0517 08/26/18 0600 08/26/18 0643  BP:  135/69 (!) 117/58 (!) 117/58  Pulse: (!) 58 65 (!) 52 64  Resp: 17 19 14 16   Temp:       TempSrc:      SpO2: 98% 99% 99% 99%  Weight:      Height:      PainSc:        Isolation Precautions No active isolations  Medications Medications  isosorbide-hydrALAZINE (BIDIL) 20-37.5 MG per tablet 2 tablet (has no administration in time range)  metoprolol tartrate (LOPRESSOR) tablet 12.5 mg (has no administration in time range)  rosuvastatin (CRESTOR) tablet 20 mg (has no administration in time range)  apixaban (ELIQUIS) tablet 2.5 mg (has no administration in time range)  albuterol (VENTOLIN HFA) 108 (90 Base) MCG/ACT inhaler 1 puff (has no administration in time range)  sodium chloride flush (NS) 0.9 % injection 3 mL (has no administration in time range)  sodium chloride flush (NS) 0.9 % injection 3 mL (has no administration in time range)  0.9 %  sodium chloride infusion (has no administration in time range)  acetaminophen (TYLENOL) tablet 650 mg (has no administration in time range)  ondansetron (ZOFRAN) injection 4 mg (has no administration in time range)  furosemide (LASIX) injection 40 mg (40 mg Intravenous Given 08/26/18 0127)  aspirin chewable tablet 324 mg (324 mg Oral Given 08/26/18 0123)    Mobility walks Moderate fall risk   Focused Assessments    R Recommendations: See Admitting Provider Note  Report given to: Manley Mason RN  Additional Notes:

## 2018-08-26 NOTE — ED Provider Notes (Signed)
Care assumed from Dr. Maryan Rued.  Patient with history of CABG, CHF, mitral regurgitation, or stenosis presenting with worsening shortness of breath for the past several days.  She did not respond to outpatient diuretics.  Work-up shows CHF with elevated BNP and creatinine.  Patient given IV Lasix.  She appears to be failing outpatient therapy.  Discussed with her cardiologist Dr. Einar Gip who request medical admission and he will consult in the morning.  Patient resting comfortably.  She is dyspneic with conversation however but not hypoxic.  Admission discussed with family practice residents.   Ezequiel Essex, MD 08/26/18 [phone]

## 2018-08-26 NOTE — H&P (Addendum)
[facility] Admission History and Physical Service Pager: [phone]  Patient name: Jodi [name] Medical record number: [id number] Date of birth: 12/04/1932 Age: 85 y.o. Gender: female  Primary Care Provider: [name], MD Consultants: Cardio Code Status: DNR  Preferred Emergency Contact: Mayford Knife, [phone]   Chief Complaint: Acute on chronic CHF   Assessment and Plan: Jodi [name] is a 85 y.o. female presenting with progressive shortness of breath over the past few days, with associated orthopnea and LE edema, likely secondary to with acute on chronic heart failure. PMH is significant for chronic CHF, HLD, HTN, CAD, CABG X 2, A fib, Aortic stenosis, mitral regurg, CKD, osteoarthritis and bronchitis.  SOB, likely 2/2 to acute on chronic combined CHF:  Pt has background of CHF, unaware of her dry weight.  Last echo in 05/2016 with EF 30%. Her dyspnea on this admission is likely secondary to acute on chronic CHF. Findings to support this include insidious worsening of dyspnea over past 3-4 days, bibasilar crackles on exam, tachypnea, elevated BNP 1145 (699 in 2018), cardiomegaly with pleural effusions and a good response to IV Lasix 43m. Placed on 2L [place] while in the ED for comfort, mild increased work of breathing noted with tachypnea. Precipitating factors could include worsening heart failure requiring increased baseline diuretic regimen, vs increased fluid intake at home as patient endorses drinking "good portion "of fluids, vs dietary indiscretions. Could also consider precipitating MI given that patient experienced recent day-long substernal chest pressure around onset of symptoms above. Lastly, considered A. fib with RVR as precipitating event, however reassured she is currently rate controlled on evaluation. Doubt pneumonia given that pt is not febrile, no productive cough and no consolidation on CXR.  Additionally reassured COVID  negative on admit. PE was considered as a differential for dyspnea but less likely given that this presentation was insidious, no tachycardia and Wells' score 0, patient anticoagulated with Apixaban and responded well to Lasix. ED provider spoke with her primary cardiologist, Dr. GEinar Gip who will be consulting on the patient in the morning.  -Admit to cardiac telemetry-Dr Chambliss -S/p IV 40 mg in ED tonight, likely re-dose for 423mLasix BID in am  -Strict ins and out -Daily weights -f/u troponin  -Monitor kidney function with daily BMP as on diuretics -Repeat echo to assess ejection fraction   -Continue CHF medications as below -Primary cardiologist consulted, appreciate recommendations -Decrease oxygen as tolerated, maintain sats over 92% -PT/OT evaluation  Chest pain: Acute, resolved.  A few days prior to admission pt reported central chest pain like "someone is on my chest" with intermittent pressure throughout the past few days.  Differentials include CHF as above, ACS, anxiety, A.fib.  Suspect likely associated with acute on chronic heart failure as discussed above.  However, given her history of CAD and CABG X 2 ACS should be considered, and will assess ventricular wall motion on echocardiogram.  As patient is no longer experiencing chest pain, believe current ACS is unlikely, despite worsening shortness of breath that is more likely secondary to heart failure exacerbation.  Especially as EKG showing no ischemic changes and troponins trending flat thus far (43, 45).  Mild elevation in troponin consistent with acute CHF.  Could consider secondary to A. fib, as patient states symptoms resolved after taking her PRN diltiazem, however reassured pt has been rate controlled and not tachycardic since arrival. -monitor for pain -Follow-up troponin -EKG in a.m. -Diuresis as above  AKI on CKD Cr 2.16 (  baseline 1.11-1.5). Likely in the setting of acute heart failure exacerbation with decreased  renal perfusion. -Holding spironolactone, will restart as appropriate -Continue diuresis as above -BMP in a.m.  Chronic combined CHF Likely exacerbation as above.  Last echo: LV EF 30% in 05/2017 with grade II diastolic dysfunction.  Follows with Dr. Einar Gip, [facility] cardiovascular, on a regular basis.  Takes Lasix 40 mg daily, metoprolol 12.5 mg twice daily, BiDil 3 times daily, and spironolactone 25 mg daily.  Not currently on an ACE or ARB due to varying renal baseline/CKD. -Repeat echo -Dr. Einar Gip to evaluate in the a.m., appreciate recommendations -Continue home CHF medications as above, hold spironolactone for now with current renal function -Continue diuresis with Lasix 39m IV twice daily  Persistent A fib HR currently 63, rate controlled. EKG showed A fib with ventricular bigeminy.  Takes diltiazem 35mQ4PRN palpitations and metoprolol 12.5 mg twice daily at home for rate control (which was recently decreased per her cardiologist from 25 mg twice daily). Anticoagulated with apixaban 2.5 mg twice daily.  Cha2ds2 vasc 6.  - Continue home metoprolol and apixaban - May add on her PRN diltiazem as needed, currently without palpitations  HTN BP 140/80 Furosemide 4049maily, Metoprolol 12.5mg33mD, isosorbide-hydralazine 20-37.5mg 40mabs, TID, spironolactone 25mg 23m daily  -Holding spironolactone due to AKI -Continue other antihypertensives   CAD, CABG X 2 Significant history including CABG in 1994 with revision CABG in 2003.  Additionally had stents placed in 2002. -Monitor chest pain as above -Continue home statin  Prediabetes:  Glucose 182 on admit.  Last A1c 6.1 in 09/2016. - Hemoglobin A1c  Aortic stenosis, mitral regurg Persistent pansystolic murmur on exam, last echo in 05/2017 showing low gradient severe AS and moderate to severe mitral regurg. Cardiology managing conservatively due to multiple concurrent comorbidities. - Management of hypertension, A. fib, and CHF as  above  Bronchitis  Proair 1 puff PRN  GERD Omeprazole 20mg P14mcan add on home omeprazole if needed   HLD  Takes Rosuvastatin 20mg on20maily -Continue    FEN/GI: heart healthy diet with fluid restriction  Prophylaxis: Apixaban   Disposition: resolution of acute on chronic CHF  History of Present Illness:  JacqueliTAKIA RUNYON y.o. 35male presenting with worsening shortness of breath and fatigue over the past 4 days.   Pt has a background of chronic dyspnea over past few months/years. Dyspnea has insidiously been worsening over the past few days. Also reports increased LE swelling and orthopnea. Spoke with her cardiologist, Dr. Ganji, wEinar Gipubled her home Lasix to 80mg fro43mmg, abo62m-4 days ago per patient's report. It helped her urinate more with minimal breathing improvement. Her dyspnea and LE swelling still persisted and dyspnea still persisted. Exercise tolerance is 10-15 ft prior to becoming dyspneic. Today was unable to even walk into the kitchen to make herself some breakfast and became very short of breath. Denies chest pain, however states a few days ago she felt chest tightness, like something was sitting on her chest.  Lasted all day, finally improved to the end of the day when she took her PRN diltiazem.  Was not associated with diaphoresis, palpitations, shortness of breath, or paresthesias at that time.  No longer present. Has intermittent cardiac palpitations with chronic A fib. Denies any associated fever, coughing, sore throat, rhinorrhea, nausea, vomiting, abdominal pain, dysuria, unexpected weight loss.  Denies any lightheadedness, dizziness, syncopal episodes.  Mild headache present. Appetite appropriate. Reports drinking a "good portion"  of water. Denies any diet indiscretions.   Pt's last weight 150.4 lb, however she is unsure on what her dry weight is.  ED Course:  In ED, her vitals were: HR 54, BP 157/86, T 97.5, RR 14, sats 100% on 2.5L. Pertinent labs  showed Cr 2.16 (baseline 1.2-1.6) , BUN 39, eGFR 24, BNP 1145 and trop x 2 trended flat (43,45). CXR showed chronic cardiomegaly and mild pulmonary edema. Pt was given 54m IV Lasix. She responded well to the diuresis with a good urine output and improvement of her dyspnea.   Review Of Systems: Per HPI with the following additions:   Review of Systems  Constitutional: Positive for malaise/fatigue. Negative for fever and weight loss.  HENT: Negative for congestion and sore throat.   Respiratory: Negative for cough, sputum production and shortness of breath.   Cardiovascular: Positive for chest pain, orthopnea and leg swelling. Negative for palpitations.  Gastrointestinal: Negative for abdominal pain, nausea and vomiting.  Genitourinary: Negative for dysuria and hematuria.  Neurological: Negative for dizziness.    Patient Active Problem List   Diagnosis Date Noted  . Acute on chronic combined systolic and diastolic CHF (congestive heart failure) ([facility] 08/26/2018  . Cough 02/14/2018  . Healthcare maintenance 02/14/2018  . Recurrent UTI 01/27/2018  . Acute on chronic diastolic (congestive) heart failure ([facility] 12/04/2016  . Thyroid nodule 08/21/2016  . Hyperglycemia 11/09/2015  . Chronic combined systolic and diastolic congestive heart failure ([facility] 03/04/2015  . Left renal mass   . Paroxysmal atrial fibrillation ([facility] [place] 08/20/2013  . Renal cell carcinoma ([facility] 09/23/2012  . Chronic kidney disease (CKD), stage III (moderate) ([facility] 02/20/2012  . Aortic stenosis, severe 12/06/2010  . SEBORRHEA 11/10/2007  . Insomnia 06/09/2007  . HYPERCHOLESTEROLEMIA 04/04/2006  . HYPERTENSION, BENIGN SYSTEMIC 04/04/2006  . CORONARY, ARTERIOSCLEROSIS 04/04/2006  . Acute combined systolic and diastolic heart failure ([facility] 04/04/2006  . Osteoarthritis involving multiple joints on both sides of body 04/04/2006    Past Medical History: Past Medical History:  Diagnosis Date  . Aortic stenosis   .  Arthritis    "maybe in my fingers and toes" (09/28/2014)  . Bradycardia   . CHF (congestive heart failure) ([facility]   . Chronic renal insufficiency, stage III (moderate) ([facility]    /[name]8/22/2016  . Coronary artery disease   . Heart murmur   . Hyperlipidemia   . Hypertension   . Macular degeneration, left eye   . Myocardial infarction ([facility] 1995; 2003  . Paroxysmal atrial fibrillation (HCC)   . Renal cell carcinoma ([facility] 09/23/2012  . Renal mass 12/06/2010   CT Abdomen 11-27-10 upper pole region of the right kidney which is suspicious for solid lesion, measuring 1.9 x 1.3 cm. This lesion is concerning for renal cell carcinoma given the solid appearance.  Following with Dr GRisa Grill  Renal bx was benign    . Shortness of breath   . Splenic infarct 12/06/2010  . Stroke ([facility] 2003   "when I had heart surgery"; denies residual on 09/28/2014  . TIA (transient ischemic attack) "several"    Past Surgical History: Past Surgical History:  Procedure Laterality Date  . CARDIAC CATHETERIZATION    . CARDIOVERSION N/A 10/20/2013   Procedure: CARDIOVERSION;  Surgeon: JLaverda Page MD;  [place]  Service: Cardiovascular;  Laterality: N/A;  H&P in file  . CARDIOVERSION N/A 05/18/2014   Procedure: CARDIOVERSION;  Surgeon: JAdrian Prows MD;  [place] [facility]  Service: Cardiovascular;  Laterality: N/A;  . CARDIOVERSION N/A  10/31/2016   Procedure: CARDIOVERSION;  Surgeon: [name], MD;  Location: [facility] ENDOSCOPY;  Service: Cardiovascular;  Laterality: N/A;  . CARDIOVERSION N/A 11/20/2016   Procedure: CARDIOVERSION;  Surgeon: [name], MD;  Location: [facility] ENDOSCOPY;  Service: Cardiovascular;  Laterality: N/A;  . CATARACT EXTRACTION W/ INTRAOCULAR LENS  IMPLANT, BILATERAL Bilateral ~ 2013  . CORONARY ANGIOPLASTY WITH STENT PLACEMENT  2002  . CORONARY ARTERY BYPASS GRAFT  1995 and 2003  . IR RADIOLOGIST EVAL & MGMT  07/03/2016  . LEFT AND RIGHT HEART CATHETERIZATION WITH CORONARY ANGIOGRAM  N/A 11/24/2013   Procedure: LEFT AND RIGHT HEART CATHETERIZATION WITH CORONARY ANGIOGRAM;  Surgeon: Laverda Page, MD;  [facility] C[facility] LAB;  Service: Cardiovascular;  Laterality: N/A;  . PERCUTANEOUS NEEDLE BIOPSY OF RENAL LESION  ?2014  . TOTAL ABDOMINAL HYSTERECTOMY  1990's   "both ovaries were full of little tiny sores"  . TYMPANOPLASTY Bilateral    "had holes in them; still have holes in them"    Social History: Social History   Tobacco Use  . Smoking status: Never Smoker  . Smokeless tobacco: Never Used  Substance Use Topics  . Alcohol use: No  . Drug use: No   Additional social history:  Lives alone in ground floor apartments  Mobilises with walking aid Manages ADLs but son helps with shopping Nil smoker Nil EOTH Please also refer to relevant sections of EMR.  Family History: Family History  Problem Relation Age of Onset  . Coronary artery disease Mother   . Stroke Mother   . Leukemia Father   . Leukemia Sister   . Cancer Brother   . Coronary artery disease Brother   . Heart attack Brother   . Heart attack Son   . Heart attack Daughter   . Myelodysplastic syndrome Sister     Allergies and Medications: Allergies  Allergen Reactions  . Sulfamethoxazole-Trimethoprim Other (See Comments)    Caused shaking and chills  . Tape Other (See Comments)    Tears skin.  Please use "paper" tape   No current facility-administered medications on file prior to encounter.    Current Outpatient Medications on File Prior to Encounter  Medication Sig Dispense Refill  . apixaban (ELIQUIS) 2.5 MG TABS tablet Take 1 tablet (2.5 mg total) by mouth 2 (two) times daily. Take 1/2 tablet of 5 mg Eliquis twice daily (Patient taking differently: Take 2.5 mg by mouth 2 (two) times daily. )    . BIDIL 20-37.5 MG tablet TAKE 2 TABLETS BY MOUTH 3 TIMES A DAY 540 tablet 3  . Cholecalciferol (VITAMIN D3) 2000 UNITS TABS Take 2,000 Units by mouth daily.     Marland Kitchen CRANBERRY PO Take 1  capsule by mouth daily.     Marland Kitchen diltiazem (CARDIZEM) 30 MG tablet TAKE 1 TABLET BY MOUTH EVERY 4 HOURS FOR PALPITATIONS AS NEEDED. NEED APPOINTMENT FOR FURTHER REFILL 90 tablet 0  . furosemide (LASIX) 40 MG tablet TAKE 1 TABLET BY MOUTH DAILY (Patient taking differently: 40 mg 2 (two) times daily. ) 90 tablet 3  . metoprolol tartrate (LOPRESSOR) 25 MG tablet Take 1 tablet (25 mg total) by mouth 2 (two) times daily. (Patient taking differently: Take 12.5 mg by mouth. 1/2 tablet BID) 180 tablet 1  . Multiple Vitamin (MULTIVITAMIN WITH MINERALS) TABS tablet Take 1 tablet by mouth daily.    . Multiple Vitamins-Minerals (PRESERVISION AREDS) CAPS Take by mouth 2 (two) times a day.    Marland Kitchen omeprazole (PRILOSEC) 20 MG capsule Take 20  mg by mouth as needed.    Marland Kitchen PROAIR HFA 108 (90 Base) MCG/ACT inhaler INHALE 1 PUFF INTO THE LUNGS EVERY 6 HOURS AS NEEDED FOR WHEEZING OR SHORTNESS OF BREATH. 8.5 g 2  . rosuvastatin (CRESTOR) 20 MG tablet TAKE 1 TABLET BY MOUTH DAILY 90 tablet 3  . spironolactone (ALDACTONE) 25 MG tablet TAKE 1 TABLET BY MOUTH DAILY. PLEASE SCHEDULE APPOINTMENT 90 tablet 0  . vitamin B-12 (CYANOCOBALAMIN) 1000 MCG tablet Take 1,000 mcg by mouth daily.    Marland Kitchen zolpidem (AMBIEN) 10 MG tablet Take 1 tablet (10 mg total) by mouth at bedtime as needed for up to 30 days for sleep. 30 tablet 3    Objective: BP 140/60   Pulse 79   Temp (!) 97.5 F (36.4 C) (Oral)   Resp 16   Ht 5' 4"  (1.626 m)   Wt 68 kg   LMP  (Exact Date)   SpO2 99%   BMI 25.75 kg/m  Exam: General: looks tired, nasal cannula in situ-2L oxygen, no acute distress  Eyes: normal extra occular eye movements, moist mucous membranes, no icterus  ENTM: partial upper and lower dentures, normal pharynx, no exudate or erythema, unable to visualize tonsils Neck: supple neck, no thyromegaly, no thyroid cysts, no lymphadenopathy  Cardiovascular: S1 + S2, pansystolic murmur with reverberation into bilateral carotids, no S3 or S4, HR  irregular, normal cap refill Respiratory: few bibasal crackles otherwise clear, mild increased work of breathing with tachypnea, on 2L satting 98% Gastrointestinal: no organomegaly, abdo soft non tender, bowel sounds present MSK: able to move all 4 limbs, right sided pitting edema of LE up to calve, pitting edema of left foot only, nontender with palpation of bilateral lower extremity Derm: no skin rashes, some bruising  Neuro: grossly in tact, PERRLA 87m bilaterally, EOMI Psych: normal mood, normal affect  Labs and Imaging: CBC BMET  Recent Labs  Lab 08/25/18 1347  WBC 8.2  HGB 11.8*  HCT 36.2  PLT 177   Recent Labs  Lab 08/25/18 1347  NA 136  K 4.6  CL 99  CO2 23  BUN 39*  CREATININE 2.16*  GLUCOSE 182*  CALCIUM 10.0     EKG: A fib, plus ventricular bigeminy   Dg Chest Portable 1 View  Result Date: 08/25/2018 CLINICAL DATA:  Shortness of breath. EXAM: PORTABLE CHEST 1 VIEW COMPARISON:  12/04/2016 FINDINGS: Marked cardiomegaly, unchanged from prior. Unchanged mediastinal contours. Post median sternotomy. Mild interstitial coarsening and Kerley B-lines suggesting pulmonary edema. Linear scarring in the right mid lung. No confluent airspace disease. No large pleural effusion. No pneumothorax. IMPRESSION: 1. Chronic marked cardiomegaly.  Suggestion of mild pulmonary edema. 2. Right midlung scarring. Electronically Signed   By: MKeith RakeM.D.   On: 08/25/2018 20:50   PLattie Haw MD 08/26/2018, 4:30 AM PGY-1, [place] pager: [phone] text pages welcome  FPTS Upper-Level Resident Addendum  I have independently interviewed and examined the patient. I have discussed the above with the original author and agree with their documentation. My edits for correction/addition/clarification are in green.Please see also any attending notes.   SPatriciaann Clan DO  Family Medicine PGY-2
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
## 2018-08-27 DIAGNOSIS — I255 Ischemic cardiomyopathy: Secondary | ICD-10-CM

## 2018-08-27 DIAGNOSIS — I2510 Atherosclerotic heart disease of native coronary artery without angina pectoris: Secondary | ICD-10-CM

## 2018-08-27 LAB — BASIC METABOLIC PANEL
Anion gap: 11 (ref 5–15)
BUN: 42 mg/dL — ABNORMAL HIGH (ref 8–23)
CO2: 24 mmol/L (ref 22–32)
Calcium: 9.1 mg/dL (ref 8.9–10.3)
Chloride: 104 mmol/L (ref 98–111)
Creatinine, Ser: 1.68 mg/dL — ABNORMAL HIGH (ref 0.44–1.00)
GFR calc Af Amer: 32 mL/min — ABNORMAL LOW (ref >60)
GFR calc non Af Amer: 28 mL/min — ABNORMAL LOW (ref >60)
Glucose, Bld: 104 mg/dL — ABNORMAL HIGH (ref 70–99)
Potassium: 3.8 mmol/L (ref 3.5–5.1)
Sodium: 139 mmol/L (ref 135–145)

## 2018-08-27 LAB — BRAIN NATRIURETIC PEPTIDE: B Natriuretic Peptide: 1002.5 pg/mL — ABNORMAL HIGH (ref 0.0–100.0)

## 2018-08-27 MED ORDER — SPIRONOLACTONE 25 MG PO TABS: 25.0000 mg | ORAL_TABLET | Freq: Every day | ORAL | Status: DC

## 2018-08-27 MED ORDER — FUROSEMIDE 10 MG/ML IJ SOLN
40.0000 mg | Freq: Every day | INTRAMUSCULAR | Status: DC
  Administered 2018-08-28: 40 mg via INTRAVENOUS
  Filled 2018-08-27: qty 4

## 2018-08-27 NOTE — Evaluation (Signed)
Occupational Therapy Evaluation Patient Details Name: Jodi [name] MRN: [id number] DOB: [birth year] Today's Date: 08/27/2018    History of Present Illness Pt is an 85 y/o female presenting with progressive shortness of breath over the past few days, with associated orthopnea and LE edema, likely secondary to with acute on chronic heart failure. PMH is significant for chronic CHF, HLD, HTN, CAD, CABG X 2, A fib, Aortic stenosis, mitral regurg and CKD.   Clinical Impression   Pt presenting to OT with slight balance deficits. Edu provided on fall risk reduction strategies and energy conservation techniques, including potential use of AE in the home. Pt receptive and demonstrating safe completion of morning ADL routine. Pt would benefit from 1x acute OT follow up to ensure carryover of safety edu since pt lives alone. No further OT follow up after d/c needed.     Follow Up Recommendations  No OT follow up    Equipment Recommendations  None recommended by OT       Precautions / Restrictions Precautions Precautions: None Restrictions Weight Bearing Restrictions: No      Mobility Bed Mobility               General bed mobility comments: Sitting at EOB   Transfers Overall transfer level: Needs assistance Equipment used: None Transfers: Sit to/from Stand Sit to Stand: Modified independent (Device/Increase time)              Balance Overall balance assessment: Needs assistance Sitting-balance support: No upper extremity supported;Feet supported Sitting balance-Leahy Scale: Good     Standing balance support: During functional activity;No upper extremity supported Standing balance-Leahy Scale: Fair Standing balance comment: Able to complete LB ADLs in standing with brief single leg stance with (S)                           ADL either performed or assessed with clinical judgement   ADL Overall ADL's : Needs assistance/impaired Eating/Feeding: Modified  independent   Grooming: Oral care;Wash/dry face;Wash/dry hands;Modified independent;Standing   Upper Body Bathing: Modified independent;Standing   Lower Body Bathing: Supervison/ safety;Sit to/from stand Lower Body Bathing Details (indicate cue type and reason): Cueing for completing ADLs seated Upper Body Dressing : Modified independent;Standing   Lower Body Dressing: Modified independent;Sit to/from stand   Toilet Transfer: Supervision/safety;Ambulation Toilet Transfer Details (indicate cue type and reason): No AE Toileting- Clothing Manipulation and Hygiene: Supervision/safety;Sit to/from stand       Functional mobility during ADLs: Supervision/safety General ADL Comments: (S), advancing to mod I by end of session. Minimal cueing for safety awareness     Vision Baseline Vision/History: Wears glasses Wears Glasses: Reading only Patient Visual Report: No change from baseline Vision Assessment?: No apparent visual deficits            Pertinent Vitals/Pain Pain Assessment: No/denies pain     Hand Dominance Right   Extremity/Trunk Assessment Upper Extremity Assessment Upper Extremity Assessment: Overall WFL for tasks assessed(WFL for age and condition)   Lower Extremity Assessment Lower Extremity Assessment: Generalized weakness   Cervical / Trunk Assessment Cervical / Trunk Assessment: Normal   Communication Communication Communication: No difficulties   Cognition Arousal/Alertness: Awake/alert Behavior During Therapy: WFL for tasks assessed/performed Overall Cognitive Status: Within Functional Limits for tasks assessed                                 General  Comments: 3/3 memory recall after 3 minutes   General Comments  Provided education re energy conservation techniques and fall risk reduction strategies. All VSS during session.             Home Living Family/patient expects to be discharged to:: Private residence Living Arrangements:  Alone Available Help at Discharge: Family;Available PRN/intermittently Type of Home: Apartment Home Access: Stairs to enter Entrance Stairs-Number of Steps: 2 Entrance Stairs-Rails: None Home Layout: One level     Bathroom Shower/Tub: Teacher, early years/pre: Standard     Home Equipment: Cane - single point;Grab bars - tub/shower          Prior Functioning/Environment Level of Independence: Independent with assistive device(s)        Comments: reports occasionally using cane        OT Problem List: Impaired balance (sitting and/or standing);Decreased knowledge of use of DME or AE      OT Treatment/Interventions: Self-care/ADL training;Energy conservation;DME and/or AE instruction;Balance training;Patient/family education;Therapeutic activities    OT Goals(Current goals can be found in the care plan section) Acute Rehab OT Goals Patient Stated Goal: none stated OT Goal Formulation: With patient Time For Goal Achievement: 09/04/18 Potential to Achieve Goals: Good  OT Frequency: Min 2X/week    AM-PAC OT "6 Clicks" Daily Activity     Outcome Measure Help from another person eating meals?: None Help from another person taking care of personal grooming?: None Help from another person toileting, which includes using toliet, bedpan, or urinal?: A Little Help from another person bathing (including washing, rinsing, drying)?: A Little Help from another person to put on and taking off regular upper body clothing?: None Help from another person to put on and taking off regular lower body clothing?: A Little 6 Click Score: 21   End of Session    Activity Tolerance: Patient tolerated treatment well Patient left: in bed;with call bell/phone within reach  OT Visit Diagnosis: Unsteadiness on feet (R26.81)                Time: 6701-4103 OT Time Calculation (min): 24 min Charges:  OT General Charges $OT Visit: 1 Visit OT Evaluation $OT Eval Low Complexity: 1  Low OT Treatments $Self Care/Home Management : 8-22 mins  Curtis Sites OTR/L  08/27/2018, 8:50 AM

## 2018-08-27 NOTE — Progress Notes (Signed)
Subjective:  With regard to dyspnea she feels much improved.  Fatigue is resolved.  She has been ambulating in the room without any significant discomfort.  Took a shower and felt short of breath.  No recurrence of leg edema.  Intake/Output from previous day:  I/O last 3 completed shifts: In: 720 [P.O.:720] Out: 600 [Urine:600] No intake/output data recorded.  Blood pressure 119/85, pulse (!) 54, temperature 99.1 F (37.3 C), temperature source Oral, resp. rate 20, height 5' 3"  (1.6 m), weight 67.9 kg, SpO2 94 %. Physical Exam  Constitutional: She appears well-developed and well-nourished. No distress.  HENT:  Head: Atraumatic.  Eyes: Conjunctivae are normal.  Neck: Neck supple. No JVD present. No thyromegaly present.  Cardiovascular: Intact distal pulses and normal pulses. An irregularly irregular rhythm present. Exam reveals no gallop, no S3 and no S4.  Murmur heard.  Harsh mid to late systolic murmur is present with a grade of 3/6 at the upper right sternal border radiating to the neck. Pulses:      Carotid pulses are on the right side with bruit and on the left side with bruit. S1 is variable and soft, S2 is muffled. No edema  Pulmonary/Chest: Effort normal and breath sounds normal.  Abdominal: Soft. Bowel sounds are normal.  Musculoskeletal: Normal range of motion.        General: No edema.  Neurological: She is alert.  Skin: Skin is warm and dry.  Psychiatric: She has a normal mood and affect.   Lab Results: BMP BNP (last 3 results) Recent Labs    08/25/18 2244 08/27/18 0425  BNP 1,145.6* 1,002.5*    ProBNP (last 3 results) No results for input(s): PROBNP in the last 8760 hours. BMP Latest Ref Rng & Units 08/27/2018 08/26/2018 08/25/2018  Glucose 70 - 99 mg/dL 104(H) 111(H) 182(H)  BUN 8 - 23 mg/dL 42(H) 34(H) 39(H)  Creatinine 0.44 - 1.00 mg/dL 1.68(H) 1.77(H) 2.16(H)  Sodium 135 - 145 mmol/L 139 137 136  Potassium 3.5 - 5.1 mmol/L 3.8 4.0 4.6  Chloride 98 - 111  mmol/L 104 101 99  CO2 22 - 32 mmol/L 24 26 23   Calcium 8.9 - 10.3 mg/dL 9.1 9.7 10.0   Hepatic Function Latest Ref Rng & Units 12/04/2016 08/25/2016 08/17/2016  Total Protein 6.5 - 8.1 g/dL 6.8 7.1 7.2  Albumin 3.5 - 5.0 g/dL 4.1 4.3 4.6  AST 15 - 41 U/L 29 34 34  ALT 14 - 54 U/L 15 24 23   Alk Phosphatase 38 - 126 U/L 80 60 59  Total Bilirubin 0.3 - 1.2 mg/dL 1.4(H) 1.7(H) 1.3(H)   CBC Latest Ref Rng & Units 08/25/2018 12/04/2016 08/25/2016  WBC 4.0 - 10.5 K/uL 8.2 9.6 9.1  Hemoglobin 12.0 - 15.0 g/dL 11.8(L) 13.7 12.7  Hematocrit 36.0 - 46.0 % 36.2 41.3 38.3  Platelets 150 - 400 K/uL 177 202 210   Lipid Panel     Component Value Date/Time   CHOL 176 11/09/2015 1127   TRIG 169 (H) 11/09/2015 1127   HDL 53 11/09/2015 1127   CHOLHDL 3.3 11/09/2015 1127   VLDL 34 (H) 11/09/2015 1127   LDLCALC 89 11/09/2015 1127   LDLDIRECT 79 10/22/2011 1448   Cardiac Panel (last 3 results) No results for input(s): CKTOTAL, CKMB, TROPONINI, RELINDX in the last 72 hours.  HEMOGLOBIN A1C Lab Results  Component Value Date   HGBA1C 6.4 (H) 08/26/2018   MPG 136.98 08/26/2018   TSH No results for input(s): TSH in the last 8760 hours.  Imaging: Imaging results have been reviewed  Cardiac Studies:  Nocturnal oximetry 03/07/2017: No significant hypoxemia.  left + right Heart catheterization 11/24/2013: Moderate pulmonary hypertension, PA pressure 46/18 mmHg. Cardiac output 3.96, can't take index 2.2. SVG to RCA ostial 90%, patent free RIMA to RI, SVG to OM1, LIMA to LAD. Aortic valve area 1.1 cm, Mean gradient of 14 mmHg. Moderate to severe mitral regurgitation, LVEF 40-45% with basal to midinferior akinesis.  Echocardiogram 08/26/2018 :   1. The left ventricle has moderately reduced systolic function, with an ejection fraction of 35-40%. There is moderately increased left ventricular wall thickness. Indeterminated due to mitral apparatus calcification. There is abnormal septal motion  consistent  with post-operative status. Left ventricular diffuse hypokinesis. Severe hypokinesis of the left ventricular, entire inferior wall. Severe hypokinesis of the left ventricular, entire inferoseptal wall.  2. The right ventricle has moderately reduced systolic function. The cavity was moderately enlarged. There is no increase in right ventricular wall thickness.  3. Left atrial size was severely dilated.  4. The mitral valve is degenerative. Moderate thickening of the mitral valve leaflet. Moderate calcification of the mitral valve leaflet. Mitral valve regurgitation is mild to moderate by color flow Doppler.  5. The tricuspid valve is grossly normal. Tricuspid valve regurgitation is mild-moderate. Pulmonary hypertension is moderate. PA pressure 48 mm Hg (RA 15)  6. The aortic valve is tricuspid. Severely thickening of the aortic valve. Severe calcifcation of the aortic valve. Moderate-severe stenosis of the aortic valve. The gradients may be underestimated due to poor signial and low LVEF.  7. There is evidence of mild plaque in the aortic root.   Scheduled Meds: . apixaban  2.5 mg Oral BID  . furosemide  40 mg Intravenous Q12H  . isosorbide-hydrALAZINE  2 tablet Oral TID  . metoprolol tartrate  12.5 mg Oral BID  . rosuvastatin  20 mg Oral Daily  . sodium chloride flush  3 mL Intravenous Q12H   Continuous Infusions: . sodium chloride     PRN Meds:.sodium chloride, acetaminophen, albuterol, ondansetron (ZOFRAN) IV, sodium chloride flush  Assessment/Plan:  1.  Acute on chronic systolic and diastolic heart failure due to multi valvular heart disease and ischemic cardiomyopathy 2.  Coronary artery disease without angina pectoris 3.  Acute stage IV kidney disease on chronic stage III kidney disease 4.  Moderate to severe aortic stenosis and mild to moderate MR. 5.  Permanent atrial fibrillation. CHA2DS2-VASc Score is 5.  Yearly risk of stroke: 6.7%.  (CHF; HTN; vasc disease DM,  Female = 1; Age  <65 =0; 65-74 = 1,  >75 =2; stroke = 2).    EKG 08/26/2018: Atrial fibrillation with controlled ventricular spots at the rate of 65 bpm, normal axis, cannot exclude inferior infarct old.  Normal QT interval.  Single PVC.  No significant change from prior EKG.  Recommendation: Patient symptoms of dyspnea has improved remarkably well.  There is no JVD, lungs are clear to exam, no leg edema.  However her BNP still is elevated), renal function is improving, would continue IV diuresis for 1 more day and can consider discharge tomorrow morning.  I will initiate spironolactone once her renal function returns back to baseline as she had done very well with regard to heart failure on this medication chronically.  Continue BiDil, Eliquis for atrial fibrillation.  Would recommend following up on the TSH as she has not had this in a while, would also recommend follow-up of BNP and BMP in the morning.  I  have not placed any orders.  [name], M.D. 08/27/2018, 8:23 AM [facility] Pager: [phone] Office: [phone] If no answer: [phone]

## 2018-08-27 NOTE — Discharge Summary (Signed)
[facility] Discharge Summary  Patient name: Jodi [name] Medical record number: [id number] Date of birth: 1932/08/16 Age: 85 y.o. Gender: female Date of Admission: 08/25/2018  Date of Discharge: 08/28/2018 Admitting Physician: Martyn Malay, MD  Primary Care Provider: [name], MD Consultants: cardiology  Indication for Hospitalization: Acute on chronic systolic/diastolic heart failure  Discharge Diagnoses/Problem List:  Htn HLD Hx of stroke CHF Aortic stenosis Atrial fib.  Disposition: Home   Discharge Condition: Stable  Discharge Exam:  General: Alert and oriented in no apparent distress Heart: Irregular rhythm with murmur Lungs: CTA bilaterally, no wheezing Abdomen: Bowel sounds present, no abdominal pain Skin: Warm and dry Extremities: No lower extremity edema   Brief Hospital Course:  Patient had recently seen her cardiologist a few days prior to her admission and had her lasix increased from 40mg  to 80mg  due to her increasing dyspnea and fatigue. She noted an increase in urination but her breathing symptoms continued to worsen. She also noted a bout of chest pain a few days before her admission that felt like tightness and lasted all day. Her dyspnea worsened to the point that prevented her from even walking into her kitchen on the day of admission. In the ED she noted to have an AKI on her chronic kidney disease. She was gently diuresed with 40mg  IV lasix. Her high sensitivity troponins were trending flat (43,45). Chest xray showed mild pulmonary edema with marked cardiomegaly. An echo was performed to assess her heart failure and her primary cardiologist was consulted. BNP on admission was 1,145 which improved to 1,002 by the next day. She continued her diuresis on 40mg  IV lasix once per day and noted that by 7/22 she was feeling much better with more energy and no dyspnea. Her creatinine improved from 2.16 on admission to  1.77>1.68 and she was discharged with a creatine of 1.97. Her atrial fibrillation was rate controlled through her hospitalization on her home metoprolol and she stayed anticoagulated on her home apixaban. Her TSH was noted to be elevated at 7.322 with free t4 within normal limits.  Issues for Follow Up:  1. Cardiology follow up with Dr. Einar Gip 2. PCP follow up on TSH/Free T4 3. PCP follow up regarding shortness of breath/CHF 4.  We stopped her Cardizem during admission, please evaluate if you want to restart or not. 5. We reduced her dose of metoprolol to 12.5 BID, please evaluate if you want to restart or not. 6.  Dr. Einar Gip said he would look at restarting Spironolactone, we have discontinued it and will default to him on whether to continue or not 7.  We did not give patient's Ambien in the hospital and as it's on BEERs criteria we recommend against it as outpatient.  Significant Procedures:  - Echo - 7/21  Significant Labs and Imaging:  Recent Labs  Lab 08/25/18 1347  WBC 8.2  HGB 11.8*  HCT 36.2  PLT 177   Recent Labs  Lab 08/25/18 1347 08/26/18 0618 08/27/18 0425 08/28/18 0412  NA 136 137 139 139  K 4.6 4.0 3.8 4.5  CL 99 101 104 100  CO2 23 26 24 27   GLUCOSE 182* 111* 104* 108*  BUN 39* 34* 42* 47*  CREATININE 2.16* 1.77* 1.68* 1.97*  CALCIUM 10.0 9.7 9.1 9.8      Results/Tests Pending at Time of Discharge:   Discharge Medications:  Allergies as of 08/28/2018      Reactions   Sulfamethoxazole-trimethoprim Other (See Comments)  Caused shaking and chills   Tape Other (See Comments)   Tears skin.  Please use "paper" tape      Medication List    STOP taking these medications   diltiazem 30 MG tablet Commonly known as: CARDIZEM   spironolactone 25 MG tablet Commonly known as: ALDACTONE   zolpidem 10 MG tablet Commonly known as: Ambien     TAKE these medications   apixaban 2.5 MG Tabs tablet Commonly known as: ELIQUIS Take 1 tablet (2.5 mg total) by  mouth 2 (two) times daily.   BiDil 20-37.5 MG tablet Generic drug: isosorbide-hydrALAZINE TAKE 2 TABLETS BY MOUTH 3 TIMES A DAY   CRANBERRY PO Take 1 capsule by mouth daily.   furosemide 40 MG tablet Commonly known as: LASIX TAKE 1 TABLET BY MOUTH DAILY What changed:   how to take this  when to take this   metoprolol tartrate 25 MG tablet Commonly known as: LOPRESSOR Take 0.5 tablets (12.5 mg total) by mouth 2 (two) times daily. What changed: how much to take   multivitamin with minerals Tabs tablet Take 1 tablet by mouth daily.   omeprazole 20 MG capsule Commonly known as: PRILOSEC Take 20 mg by mouth as needed.   PreserVision AREDS Caps Take by mouth 2 (two) times a day.   ProAir HFA 108 (90 Base) MCG/ACT inhaler Generic drug: albuterol INHALE 1 PUFF INTO THE LUNGS EVERY 6 HOURS AS NEEDED FOR WHEEZING OR SHORTNESS OF BREATH. What changed: See the new instructions.   rosuvastatin 20 MG tablet Commonly known as: CRESTOR TAKE 1 TABLET BY MOUTH DAILY   Turmeric 500 MG Caps Take 500 mg by mouth daily.   vitamin B-12 1000 MCG tablet Commonly known as: CYANOCOBALAMIN Take 1,000 mcg by mouth daily.   Vitamin D3 50 MCG (2000 UT) Tabs Take 2,000 Units by mouth daily.       Discharge Instructions: Please refer to Patient Instructions section of EMR for full details.  Patient was counseled important signs and symptoms that should prompt return to medical care, changes in medications, dietary instructions, activity restrictions, and follow up appointments.   Follow-Up Appointments:   Lurline Del, MD 08/28/2018, 12:59 PM PGY-1, [place]

## 2018-08-27 NOTE — Progress Notes (Signed)
Physical Therapy Treatment Patient Details Name: Jodi [name] MRN: [id number] DOB: 09/29/1932 Today's Date: 08/27/2018    History of Present Illness Pt is an 84 y/o female presenting with progressive shortness of breath over the past few days, with associated orthopnea and LE edema, likely secondary to with acute on chronic heart failure. PMH is significant for chronic CHF, HLD, HTN, CAD, CABG X 2, A fib, Aortic stenosis, mitral regurg and CKD.    PT Comments    Pt is looking forward to d/c tomorrow and states she is staying with her daughter for a few days until she's back to 100%. Pt is limited in safe mobility by DoE and fatigue from CHF, as well as generalized weakness. Pt is mod I for bed mobility and min guard for transfers and ambulation of 250 feet without AD. Reinforced education about energy conservation. PT will continue to follow until d/c.     Follow Up Recommendations  Home health PT     Equipment Recommendations  Other (comment)(pt refusing rollator)       Precautions / Restrictions Precautions Precautions: None Restrictions Weight Bearing Restrictions: No    Mobility  Bed Mobility Overal bed mobility: Modified Independent             General bed mobility comments: use of bed rail to pull to EoB   Transfers Overall transfer level: Needs assistance Equipment used: None Transfers: Sit to/from Stand Sit to Stand: Min guard         General transfer comment: Min guard for safety.   Ambulation/Gait Ambulation/Gait assistance: Min guard Gait Distance (Feet): 250 Feet Assistive device: None Gait Pattern/deviations: Step-through pattern;Decreased stride length Gait velocity: Decreased   General Gait Details: slow, steady gait to begin with increased use of handrails in hallway as she progressed and became SoB       Balance Overall balance assessment: Needs assistance Sitting-balance support: No upper extremity supported;Feet supported Sitting  balance-Leahy Scale: Good     Standing balance support: During functional activity;Single extremity supported;No upper extremity supported Standing balance-Leahy Scale: Fair Standing balance comment: Able to maintain static standing without UE support                             Cognition Arousal/Alertness: Awake/alert Behavior During Therapy: WFL for tasks assessed/performed Overall Cognitive Status: Within Functional Limits for tasks assessed                                           General Comments General comments (skin integrity, edema, etc.): Pt with 2/4 DoE with longer distance ambulation, SaO2 >90%O2 throughout session, reinforced education on energy conservation. Pt states she is almost back at her baseline.       Pertinent Vitals/Pain Pain Assessment: No/denies pain    Home Living Family/patient expects to be discharged to:: Private residence Living Arrangements: Alone Available Help at Discharge: Family;Available PRN/intermittently Type of Home: Apartment Home Access: Stairs to enter Entrance Stairs-Rails: None Home Layout: One level Home Equipment: Cane - single point;Grab bars - tub/shower      Prior Function Level of Independence: Independent with assistive device(s)      Comments: Reports using cane for ambulation.    PT Goals (current goals can now be found in the care plan section) Acute Rehab PT Goals Patient Stated Goal: to be able to  breathe better PT Goal Formulation: With patient Time For Goal Achievement: 09/09/18 Potential to Achieve Goals: Good Progress towards PT goals: Progressing toward goals    Frequency    Min 3X/week      PT Plan Current plan remains appropriate       AM-PAC PT "6 Clicks" Mobility   Outcome Measure  Help needed turning from your back to your side while in a flat bed without using bedrails?: None Help needed moving from lying on your back to sitting on the side of a flat bed  without using bedrails?: None Help needed moving to and from a bed to a chair (including a wheelchair)?: A Little Help needed standing up from a chair using your arms (e.g., wheelchair or bedside chair)?: A Little Help needed to walk in hospital room?: A Little Help needed climbing 3-5 steps with a railing? : A Little 6 Click Score: 20    End of Session Equipment Utilized During Treatment: Gait belt Activity Tolerance: Patient tolerated treatment well Patient left: in bed;with call bell/phone within reach(sitting EOB ) Nurse Communication: Mobility status PT Visit Diagnosis: Other abnormalities of gait and mobility (R26.89);Muscle weakness (generalized) (M62.81)     Time: 5462-7035 PT Time Calculation (min) (ACUTE ONLY): 16 min  Charges:  $Gait Training: 8-22 mins                     Jodi [name] B. Migdalia Dk PT, DPT Acute Rehabilitation Services Pager [phone] Office [phone]    [place] 08/27/2018, 4:21 PM

## 2018-08-27 NOTE — Progress Notes (Signed)
Family Medicine Teaching Service Daily Progress Note Intern Pager: [phone]  Patient name: Jodi [name] Medical record number: [id number] Date of birth: 1932-06-01 Age: 85 y.o. Gender: female  Primary Care Provider: [name], MD Consultants: Cardio Code Status: DNR  Pt Overview and Major Events to Date:  Jodi [name] is a 85 y.o. female presenting with progressive shortness of breath over the past few days, with associated orthopnea and LE edema, likely secondary to with acute on chronic heart failure.  Assessment and Plan:  Acute on chronic combined systolic/dialostolic heart failure High sensitivity troponins stable in 40s. BNP 1,145 on 7/20 > 1,002.5 7/22. Echo shows 35-40% ejection fraction with increased left ventricular wall thickness, and diffuse left ventricular hypokinesis, particularly inferior and inferoseptal wall. Creatinine trending down from 2.16 on admission > 1.77 on 7/21 > 1.68 on 7/22. - Follow Dr. Einar Gip, Cardiology recommendations   - IV Lasix 40mg  once per day  - Trend BNP and serum creatinine  - Continue anticoagulation on Eliquis  - Home Spironolactone to be restarted by cardiology as appropriate per Dr. Irven Shelling note  Chest pain - Resolved A few days prior to admission pt reported central chest pain like "someone is on my chest" with intermittent pressure throughout the past few days. Has not endorsed chest pain since. Troponins stable in 40s. EKG shows atrial fib. With ventricular bigeminy and old anterolateral and inferior infarcts. - Monitor for pain  AKI on CKD - Improving Cr 2.16 (baseline 1.11-1.5) > 1.77 on 7/21 > 1.68 on 7/22. Likely in the setting of acute heart failure exacerbation with decreased renal perfusion. -Holding spironolactone, cardiology to restart as appropriate -Continue diuresis as above  Persistent A fib - stable HR currently 54, rate controlled. EKG showed A fib with ventricular bigeminy.  Takes diltiazem 30mg   Q4PRN palpitations and metoprolol 12.5 mg twice daily at home for rate control (which was recently decreased per her cardiologist from 25 mg twice daily). Anticoagulated with apixaban 2.5 mg twice daily.  Cha2ds2 vasc 6.   - Continue home metoprolol and apixaban - May add on her PRN diltiazem as needed, currently without palpitations   HTN - stable Home meds include: Furosemide 40mg  daily, Metoprolol 12.5mg  BID, isosorbide-hydralazine 20-37.5mg  2 tabs, TID, spironolactone 25mg  once daily  -Continue IV lasix, Metoprolol, BiDil  CAD, CABG X 2 Significant history including CABG in 1994 with revision CABG in 2003.  Additionally had stents placed in 2002. -Monitor chest pain as above -Continue home statin  Prediabetes:  Glucose 182 on admit.  Last A1c 6.1 in 09/2016. - Hemoglobin A1c - 6.4  Aortic stenosis, mitral regurg Persistent pansystolic murmur on exam, last echo in 05/2017 showing low gradient severe AS and moderate to severe mitral regurg. Cardiology managing conservatively due to multiple concurrent comorbidities. - Management of hypertension, A. fib, and CHF as above  Bronchitis  Proair 1 puff PRN  GERD Omeprazole 20mg  PRN -can add on home omeprazole if needed   HLD  Takes Rosuvastatin 20mg  once daily -Continue home med  FEN/GI: heart healthy with fluid restriction PPx: Apixaban  Disposition: Pending resolution of acute on chronic CHF  Subjective:  Patient endorses breathing better and feeling less fatigue than yesterday. States she thinks she slept well. She says she is not quite back to what she feels her baseline is but that she is moving in the right direction.  Objective: Temp:  [97.6 F (36.4 C)-99.1 F (37.3 C)] 99.1 F (37.3 C) (07/22 0501) Pulse Rate:  [51-77]  54 (07/22 0501) Resp:  [16-20] 20 (07/22 0501) BP: (101-145)/(50-85) 119/85 (07/22 0501) SpO2:  [94 %-100 %] 94 % (07/22 0501) Weight:  [67.6 kg-67.9 kg] 67.9 kg (07/22 0527) Physical  Exam: General: Alert and oriented in no apparent distress Heart: Irregularly irregular with murmur Lungs: CTA bilaterally, mild expiratory wheezing  Abdomen: Bowel sounds present, no abdominal pain Skin: Warm and dry Extremities: No lower extremity edema   Laboratory: Recent Labs  Lab 08/25/18 1347  WBC 8.2  HGB 11.8*  HCT 36.2  PLT 177   Recent Labs  Lab 08/25/18 1347 08/26/18 0618 08/27/18 0425  NA 136 137 139  K 4.6 4.0 3.8  CL 99 101 104  CO2 23 26 24   BUN 39* 34* 42*  CREATININE 2.16* 1.77* 1.68*  CALCIUM 10.0 9.7 9.1  GLUCOSE 182* 111* 104*      Imaging/Diagnostic Tests: Dg Chest Portable 1 View  Result Date: 08/25/2018 CLINICAL DATA:  Shortness of breath. EXAM: PORTABLE CHEST 1 VIEW COMPARISON:  12/04/2016 FINDINGS: Marked cardiomegaly, unchanged from prior. Unchanged mediastinal contours. Post median sternotomy. Mild interstitial coarsening and Kerley B-lines suggesting pulmonary edema. Linear scarring in the right mid lung. No confluent airspace disease. No large pleural effusion. No pneumothorax. IMPRESSION: 1. Chronic marked cardiomegaly.  Suggestion of mild pulmonary edema. 2. Right midlung scarring. Electronically Signed   By: Keith Rake M.D.   On: 08/25/2018 20:50     Lurline Del, MD 08/27/2018, 6:13 AM PGY-1, [place] Intern pager: [phone], text pages welcome

## 2018-08-28 ENCOUNTER — Telehealth: Payer: Self-pay

## 2018-08-28 LAB — BASIC METABOLIC PANEL
Anion gap: 12 (ref 5–15)
BUN: 47 mg/dL — ABNORMAL HIGH (ref 8–23)
CO2: 27 mmol/L (ref 22–32)
Calcium: 9.8 mg/dL (ref 8.9–10.3)
Chloride: 100 mmol/L (ref 98–111)
Creatinine, Ser: 1.97 mg/dL — ABNORMAL HIGH (ref 0.44–1.00)
GFR calc Af Amer: 27 mL/min — ABNORMAL LOW (ref >60)
GFR calc non Af Amer: 23 mL/min — ABNORMAL LOW (ref >60)
Glucose, Bld: 108 mg/dL — ABNORMAL HIGH (ref 70–99)
Potassium: 4.5 mmol/L (ref 3.5–5.1)
Sodium: 139 mmol/L (ref 135–145)

## 2018-08-28 LAB — T4, FREE: Free T4: 0.99 ng/dL (ref 0.61–1.12)

## 2018-08-28 LAB — TSH: TSH: 7.322 u[IU]/mL — ABNORMAL HIGH (ref 0.350–4.500)

## 2018-08-28 MED ORDER — METOPROLOL TARTRATE 25 MG PO TABS: 12.5000 mg | ORAL_TABLET | Freq: Two times a day (BID) | ORAL | 0 refills | Status: DC

## 2018-08-28 MED ORDER — FUROSEMIDE 40 MG PO TABS: 40.0000 mg | ORAL_TABLET | Freq: Every day | ORAL | Status: DC

## 2018-08-28 MED ORDER — APIXABAN 2.5 MG PO TABS: 2.5000 mg | ORAL_TABLET | Freq: Two times a day (BID) | ORAL | 0 refills | Status: DC

## 2018-08-28 NOTE — Progress Notes (Signed)
Subjective:  Breathing well. Not requiring oxygen.  Objective:  Vital Signs in the last 24 hours: Temp:  [97.6 F (36.4 C)-98.3 F (36.8 C)] 98 F (36.7 C) (07/23 0447) Pulse Rate:  [44-64] 48 (07/23 0447) Resp:  [17-20] 20 (07/23 0447) BP: (102-140)/(49-88) 140/70 (07/23 0447) SpO2:  [95 %-97 %] 96 % (07/23 0447) Weight:  [67.8 kg] 67.8 kg (07/23 0532)  Intake/Output from previous day: 07/22 0701 - 07/23 0700 In: 243 [P.O.:240; I.V.:3] Out: 1350 [Urine:1350]  Physical Exam Constitutional: She appears well-developed and well-nourished. No distress.  HENT:  Head: Atraumatic.  Eyes: Conjunctivae are normal.  Neck: Neck supple. No JVD present. No thyromegaly present.  Cardiovascular: Intact distal pulses and normal pulses. An irregularly irregular rhythm present. Exam reveals no gallop, no S3 and no S4.  Murmur heard.  Harsh mid to late systolic murmur is present with a grade of 3/6 at the upper right sternal border radiating to the neck. Pulses:      Carotid pulses are on the right side with bruit and on the left side with bruit. S1 is variable and soft, S2 is muffled. No edema  Pulmonary/Chest: Effort normal and breath sounds normal.  Abdominal: Soft. Bowel sounds are normal.  Musculoskeletal: Normal range of motion.        General: No edema.  Neurological: She is alert.  Skin: Skin is warm and dry.  Psychiatric: She has a normal mood and affect.   Lab Results: BMP Recent Labs    08/26/18 0618 08/27/18 0425 08/28/18 0412  NA 137 139 139  K 4.0 3.8 4.5  CL 101 104 100  CO2 26 24 27   GLUCOSE 111* 104* 108*  BUN 34* 42* 47*  CREATININE 1.77* 1.68* 1.97*  CALCIUM 9.7 9.1 9.8  GFRNONAA 26* 28* 23*  GFRAA 30* 32* 27*    CBC Recent Labs  Lab 08/25/18 1347  WBC 8.2  RBC 3.87  HGB 11.8*  HCT 36.2  PLT 177  MCV 93.5  MCH 30.5  MCHC 32.6  RDW 13.9    HEMOGLOBIN A1C Lab Results  Component Value Date   HGBA1C 6.4 (H) 08/26/2018   MPG 136.98 08/26/2018     Cardiac Panel (last 3 results) No results for input(s): CKTOTAL, CKMB, TROPONINI, RELINDX in the last 8760 hours.  BNP (last 3 results) Recent Labs    08/25/18 2244 08/27/18 0425  BNP 1,145.6* 1,002.5*    TSH Recent Labs    08/28/18 0412  TSH 7.322*    Lipid Panel     Component Value Date/Time   CHOL 176 11/09/2015 1127   TRIG 169 (H) 11/09/2015 1127   HDL 53 11/09/2015 1127   CHOLHDL 3.3 11/09/2015 1127   VLDL 34 (H) 11/09/2015 1127   LDLCALC 89 11/09/2015 1127   LDLDIRECT 79 10/22/2011 1448      Cardiac Studies:  EKG 08/26/2018: Atrial fibrillation with controlled ventricular rate of 65 bpm, normal axis, cannot exclude inferior infarct old. Normal QT interval. Single PVC. No significant change from prior EKG.   Echocardiogram 08/26/2018 :   1. The left ventricle has moderately reduced systolic function, with an ejection fraction of 35-40%. There is moderately increased left ventricular wall thickness. Indeterminated due to mitral apparatus calcification. There is abnormal septal motion  consistent with post-operative status. Left ventricular diffuse hypokinesis. Severe hypokinesis of the left ventricular, entire inferior wall. Severe hypokinesis of the left ventricular, entire inferoseptal wall. 2. The right ventricle has moderately reduced systolic function. The cavity was  moderately enlarged. There is no increase in right ventricular wall thickness. 3. Left atrial size was severely dilated. 4. The mitral valve is degenerative. Moderate thickening of the mitral valve leaflet. Moderate calcification of the mitral valve leaflet. Mitral valve regurgitation is mild to moderate by color flow Doppler. 5. The tricuspid valve is grossly normal. Tricuspid valve regurgitation is mild-moderate. Pulmonary hypertension is moderate. PA pressure 48 mm Hg (RA 15) 6. The aortic valve is tricuspid. Severely thickening of the aortic valve. Severe calcifcation of the  aortic valve. Moderate-severe stenosis of the aortic valve. The gradients may be underestimated due to poor signial and low LVEF. 7. There is evidence of mild plaque in the aortic root.  Left + right heart catheterization 11/24/2013: Moderate pulmonary hypertension, PA pressure 46/18 mmHg. Cardiac outputL 3.96, can't take index 2.2. SVG to RCA ostial 90%, patent free RIMA to RI, SVG to OM1, LIMA to LAD. Aortic valve area 1.1 cm, Mean gradient of 14 mmHg. Moderate to severe mitral regurgitation, LVEF 40-45% with basal to midinferior akinesis.  Assessment & Recommendations:  85 y/o Caucasian female with CAD s/p CABGX2, ischemic cardiomyopathy EF 35-40%, moderate to severe aortic stenosis (possibly low flow low gradient), mild ot mod MR, TR, mod pulmonary hypertension, CKD 3/4, admitted with acute on chronic systolic heart failure  Acute on chronic systolic heart failure: Modest diuresis. I am not sure all of it has been accounted for.  BNP trending down. Clinically appears euvolumic. Okay to resume furosemide PO 40 mg on discharge.  Continue to hold spironolactone in light of increased creatinine.  Continue metoprolol tartarate 12.5 mg bid for now. Will switch to succinate outpatient.   Continue Bidil 20-37.5 mg 2 tab tid.   Valvular heart disease: Possibly low flow low gradient AS, mild to mod MR, TR. Previously deemed not be a candidate for any intervention. Will continue outpatient follow up with Dr. Einar Gip for medical management.   CAD s/p CABG: Known ischemic cardiomyopathy with no recent angina symptoms. Continue medical management. Not on Aspirin due to ongoing use of eliquis.   Permanent Afib: Slow ventricular response. CHA2DS2-VASc Score is5. Yearly risk of stroke: 6.7% Continue eliquis 2.5 mg bid.   CKD 3/4: Cr 1.97 is about new baseline for the patient. Do not think this indicated AKI.   Will arrange outpatient follow up with Dr. Einar Gip.   Nigel Mormon,  M.D. 08/28/2018, 7:24 AM [facility] Pager: [phone] Office: [phone] If no answer: [phone]

## 2018-08-28 NOTE — Consult Note (Signed)
   [facility] Inpatient Consult   08/28/2018  Jodi [name] [birth year] [id number]  THN status: Not currently active  Patient was assessed for [facility] for community services. Patient has a distant history with [facility] for EMMI calls follow up and [facility] Pharmacist.   Patient with [employer]. Primary Care Provider: Dr. Talbert Cage.  Plan: Attempts to contact patient in room.  Patient will be followed by General EMMI follow up.    Of note, [facility] Care Management services does not replace or interfere with any services that are arranged by inpatient [facility] care management team.   For additional questions or referrals please contact:   Natividad Brood, RN BSN [facility] Liaison  [phone] business mobile phone Toll free office [phone]  Fax number: [phone] [email or web address]

## 2018-08-28 NOTE — Telephone Encounter (Signed)
Pt's daughter [name] called stating that pt was dc'd from hospital and Ambien is not on med list. Wants to know if this was dc's in the hospital. Pt really needs it to sleep. Please review and advise.//ah

## 2018-08-28 NOTE — Progress Notes (Signed)
Patient d/c home per MD order. iv and tele d/c, discharge summary and teaching review with patient denies concerns at this time.

## 2018-08-28 NOTE — Telephone Encounter (Signed)
I am okay for her to take it

## 2018-08-28 NOTE — Discharge Instructions (Addendum)
Discharge summary: - You were admitted to the hospital for shortness of breath and a worsening of heart failure. - Follow up with your PCP regarding your shortness of breath. - Please follow up with your cardiologist, Dr. Einar Gip. - Recommend following up with your PCP regarding your thyroid test - TSH - Please follow up with your PCP within one week for recheck, including new medications such as your blood pressure medicine - We have recommended not taking your Ambien until following up with your PCP - We have reduced your home dose of metoprolol to 12.5 twice per day - We also have stopped your Cardizem until you follow up with your cardiologist or PCP - We have stopped your Spironolactone, Dr. Einar Gip will follow up with you about whether to restart your spironolactone or not. - Please return if you experience similar symptoms, high fevers, confusion, chest pain that does not subside within 15 minutes or shortness of breath.    Information on my medicine - ELIQUIS (apixaban)  Why was Eliquis prescribed for you? Eliquis was prescribed for you to reduce the risk of a blood clot forming that can cause a stroke if you have a medical condition called atrial fibrillation (a type of irregular heartbeat).  What do You need to know about Eliquis ? Take your Eliquis TWICE DAILY - one tablet in the morning and one tablet in the evening with or without food. If you have difficulty swallowing the tablet whole please discuss with your pharmacist how to take the medication safely.  Take Eliquis exactly as prescribed by your doctor and DO NOT stop taking Eliquis without talking to the doctor who prescribed the medication.  Stopping may increase your risk of developing a stroke.  Refill your prescription before you run out.  After discharge, you should have regular check-up appointments with your healthcare provider that is prescribing your Eliquis.  In the future your dose may need to be changed if  your kidney function or weight changes by a significant amount or as you get older.  What do you do if you miss a dose? If you miss a dose, take it as soon as you remember on the same day and resume taking twice daily.  Do not take more than one dose of ELIQUIS at the same time to make up a missed dose.  Important Safety Information A possible side effect of Eliquis is bleeding. You should call your healthcare provider right away if you experience any of the following: ? Bleeding from an injury or your nose that does not stop. ? Unusual colored urine (red or dark brown) or unusual colored stools (red or black). ? Unusual bruising for unknown reasons. ? A serious fall or if you hit your head (even if there is no bleeding).  Some medicines may interact with Eliquis and might increase your risk of bleeding or clotting while on Eliquis. To help avoid this, consult your healthcare provider or pharmacist prior to using any new prescription or non-prescription medications, including herbals, vitamins, non-steroidal anti-inflammatory drugs (NSAIDs) and supplements.  This website has more information on Eliquis (apixaban): http://www.eliquis.com/eliquis/home

## 2018-08-28 NOTE — TOC Transition Note (Signed)
Transition of Care [facility]) - CM/SW Discharge Note   Patient Details  Name: Jodi [name] MRN: [id number] Date of Birth: January 02, 1933  [facility]) CM/SW Contact:  Zenon Mayo, RN Phone Number: 08/28/2018, 1:17 PM   Clinical Narrative:    From home for discharge today, will need HHPT, NCM offered choice, she chose [facility] for HHPT, referral given to [facility], he states he can take this referral.  Soc will begin 24-48 hrs post dc.   Final next level of care: [place] Barriers to Discharge: No Barriers Identified   Patient Goals and CMS Choice Patient states their goals for this hospitalization and ongoing recovery are:: get better CMS Medicare.gov Compare Post Acute Care list provided to:: Patient Choice offered to / list presented to : Patient  Discharge Placement                       Discharge Plan and Services                DME Arranged: (NA)         HH Arranged: PT HH Agency: [facility] Date [place]: 08/28/18 Time [place]: 4720 Representative spoke with at [place]: [place] ([place]) Interventions     Readmission Risk Interventions No flowsheet data found.

## 2018-08-29 ENCOUNTER — Other Ambulatory Visit: Payer: Self-pay | Admitting: Pharmacist

## 2018-08-29 NOTE — Telephone Encounter (Signed)
Angel aware but states that pt's anxiety is high and wants to know if she could be prescribed something for it. Wants to know if you can send rx or send message to Dr. Erin Hearing to discuss with pt.//ah

## 2018-08-29 NOTE — Patient Outreach (Signed)
[facility]) Care Management  08/29/2018  Jodi [name] 1932/08/26 [id number]   Updating patient THN active status to reflect not active. THN CM case was closed 11/29/2018.   Catie Darnelle Maffucci, PharmD, [place] Phone: [phone]

## 2018-08-29 NOTE — Telephone Encounter (Signed)
Mailbox full, will try again later

## 2018-08-29 NOTE — Telephone Encounter (Signed)
I keep sending this and PCP keeps stopping, she should discuss with them really. I do not see problem using this. She has been on Ambien 10 mg daily for years without abuse. But I am not an expert on this and I am concerned to Rx without their permission.

## 2018-08-30 DIAGNOSIS — I130 Hypertensive heart and chronic kidney disease with heart failure and stage 1 through stage 4 chronic kidney disease, or unspecified chronic kidney disease: Secondary | ICD-10-CM | POA: Diagnosis not present

## 2018-08-30 DIAGNOSIS — N179 Acute kidney failure, unspecified: Secondary | ICD-10-CM | POA: Diagnosis not present

## 2018-08-30 DIAGNOSIS — I5043 Acute on chronic combined systolic (congestive) and diastolic (congestive) heart failure: Secondary | ICD-10-CM | POA: Diagnosis not present

## 2018-08-30 DIAGNOSIS — N189 Chronic kidney disease, unspecified: Secondary | ICD-10-CM | POA: Diagnosis not present

## 2018-08-30 DIAGNOSIS — I350 Nonrheumatic aortic (valve) stenosis: Secondary | ICD-10-CM | POA: Diagnosis not present

## 2018-09-01 NOTE — Telephone Encounter (Signed)
Angel aware. Will contact pcp.//ah

## 2018-09-01 NOTE — Telephone Encounter (Signed)
Please let her daughter know it should not have been left off of her discharge list.  I will be happy to discuss further with her during her follow up visit.  Thanks  [place]

## 2018-09-02 DIAGNOSIS — I350 Nonrheumatic aortic (valve) stenosis: Secondary | ICD-10-CM | POA: Diagnosis not present

## 2018-09-02 DIAGNOSIS — N179 Acute kidney failure, unspecified: Secondary | ICD-10-CM | POA: Diagnosis not present

## 2018-09-02 DIAGNOSIS — I5043 Acute on chronic combined systolic (congestive) and diastolic (congestive) heart failure: Secondary | ICD-10-CM | POA: Diagnosis not present

## 2018-09-02 DIAGNOSIS — I130 Hypertensive heart and chronic kidney disease with heart failure and stage 1 through stage 4 chronic kidney disease, or unspecified chronic kidney disease: Secondary | ICD-10-CM | POA: Diagnosis not present

## 2018-09-02 DIAGNOSIS — N189 Chronic kidney disease, unspecified: Secondary | ICD-10-CM | POA: Diagnosis not present

## 2018-09-03 ENCOUNTER — Other Ambulatory Visit: Payer: Self-pay

## 2018-09-03 MED ORDER — ZOLPIDEM TARTRATE 10 MG PO TABS: 5.0000 mg | ORAL_TABLET | Freq: Every evening | ORAL | 2 refills | Status: DC | PRN

## 2018-09-03 NOTE — Telephone Encounter (Signed)
Spoke to [place] patient's daughter.  She states that her mother needs the Ambien to sleep and that she can not sleep without it.  She thinks that she did not have it in the hospital.   [name] states that her mother gets agitated easily and out of breath walking to her mailbox which she estimates to be about 60 feet from her apartment.  She states that she really needs something for her nerves as well.  [name] can be reached at [phone] if there are further questions.   Ozella Almond, [place]

## 2018-09-03 NOTE — Telephone Encounter (Signed)
Spoke with Jodi [name] her daughter  Agreed to Rx [place] for quality of life with understanding it is not recommended for the elderly  Asked her to let me know if Jodi [name] anxiety is not improving   May need to move toward more hospice type approach

## 2018-09-04 DIAGNOSIS — I5043 Acute on chronic combined systolic (congestive) and diastolic (congestive) heart failure: Secondary | ICD-10-CM | POA: Diagnosis not present

## 2018-09-04 DIAGNOSIS — I130 Hypertensive heart and chronic kidney disease with heart failure and stage 1 through stage 4 chronic kidney disease, or unspecified chronic kidney disease: Secondary | ICD-10-CM | POA: Diagnosis not present

## 2018-09-04 DIAGNOSIS — N179 Acute kidney failure, unspecified: Secondary | ICD-10-CM | POA: Diagnosis not present

## 2018-09-04 DIAGNOSIS — I350 Nonrheumatic aortic (valve) stenosis: Secondary | ICD-10-CM | POA: Diagnosis not present

## 2018-09-04 DIAGNOSIS — N189 Chronic kidney disease, unspecified: Secondary | ICD-10-CM | POA: Diagnosis not present

## 2018-09-08 ENCOUNTER — Other Ambulatory Visit: Payer: Self-pay | Admitting: Family Medicine

## 2018-09-10 ENCOUNTER — Encounter: Payer: Self-pay | Admitting: Cardiology

## 2018-09-10 ENCOUNTER — Other Ambulatory Visit: Payer: Self-pay | Admitting: Cardiology

## 2018-09-10 ENCOUNTER — Other Ambulatory Visit: Payer: Self-pay

## 2018-09-10 ENCOUNTER — Ambulatory Visit (INDEPENDENT_AMBULATORY_CARE_PROVIDER_SITE_OTHER): Payer: Medicare Other | Admitting: Cardiology

## 2018-09-10 VITALS — BP 150/78 | HR 71 | Temp 97.7°F | Ht 63.0 in | Wt 151.4 lb

## 2018-09-10 DIAGNOSIS — I5023 Acute on chronic systolic (congestive) heart failure: Secondary | ICD-10-CM | POA: Diagnosis not present

## 2018-09-10 DIAGNOSIS — I129 Hypertensive chronic kidney disease with stage 1 through stage 4 chronic kidney disease, or unspecified chronic kidney disease: Secondary | ICD-10-CM

## 2018-09-10 DIAGNOSIS — N184 Chronic kidney disease, stage 4 (severe): Secondary | ICD-10-CM | POA: Diagnosis not present

## 2018-09-10 DIAGNOSIS — I480 Paroxysmal atrial fibrillation: Secondary | ICD-10-CM
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
## 2018-09-11 LAB — BASIC METABOLIC PANEL
BUN/Creatinine Ratio: 19 (ref 12–28)
BUN: 33 mg/dL — ABNORMAL HIGH (ref 8–27)
CO2: 24 mmol/L (ref 20–29)
Calcium: 10.1 mg/dL (ref 8.7–10.3)
Chloride: 100 mmol/L (ref 96–106)
Creatinine, Ser: 1.76 mg/dL — ABNORMAL HIGH (ref 0.57–1.00)
GFR calc Af Amer: 30 mL/min/{1.73_m2} — ABNORMAL LOW (ref >59)
GFR calc non Af Amer: 26 mL/min/{1.73_m2} — ABNORMAL LOW (ref >59)
Glucose: 114 mg/dL — ABNORMAL HIGH (ref 65–99)
Potassium: 4.7 mmol/L (ref 3.5–5.2)
Sodium: 142 mmol/L (ref 134–144)

## 2018-09-14 ENCOUNTER — Encounter: Payer: Self-pay | Admitting: Cardiology

## 2018-09-16 ENCOUNTER — Telehealth: Payer: Self-pay

## 2018-09-16 DIAGNOSIS — I5043 Acute on chronic combined systolic (congestive) and diastolic (congestive) heart failure: Secondary | ICD-10-CM

## 2018-09-16 DIAGNOSIS — I5042 Chronic combined systolic (congestive) and diastolic (congestive) heart failure: Secondary | ICD-10-CM

## 2018-09-16 DIAGNOSIS — N184 Chronic kidney disease, stage 4 (severe): Secondary | ICD-10-CM

## 2018-09-16 MED ORDER — SPIRONOLACTONE 25 MG PO TABS: 12.5000 mg | ORAL_TABLET | Freq: Every day | ORAL | 3 refills | Status: DC

## 2018-09-16 NOTE — Telephone Encounter (Signed)
Spoke with pt she is aware but there is no aldactone in her chart can you send It in ?

## 2018-09-16 NOTE — Telephone Encounter (Signed)
-----   Message from Miquel Dunn, NP sent at 09/14/2018  7:33 PM EDT ----- Please call patient and let her know that her kidney function has improved. I would like for her to try 1/2 tab of aldactone again and recheck BMP in 2-3 weeks for follow up.  Thanks AK

## 2018-09-16 NOTE — Telephone Encounter (Signed)
Kumar from [facility] is calling for verbal orders for pt, 1x for 1 week and 2 times for 4 weeks. Also requesting a rolator walker Rx. Pt having a hard time walking from the car to buildings without getting SOB. Please fax rx to [facility] [phone]

## 2018-09-16 NOTE — Addendum Note (Signed)
Addended by: Gwinda Maine on: 09/16/2018 01:09 PM   Modules accepted: Orders

## 2018-09-17 NOTE — Telephone Encounter (Signed)
Please call in the above order

## 2018-09-18 NOTE — Telephone Encounter (Signed)
Called Dwyane Dee form [facility] at [phone] and gave him verbal orders for patient per Dr. Erin Hearing.    Faxed order for rolling walker to [facility] [phone].  Ozella Almond, [place]

## 2018-09-19 ENCOUNTER — Telehealth: Payer: Self-pay

## 2018-09-19 NOTE — Telephone Encounter (Signed)
[name]  I will call and talk with her and her daughter but wanted to check with you if you think a hopsice consult is warranted from her CHF standpoint.  Or if you have any other ideas  Thanks  Truman Hayward

## 2018-09-19 NOTE — Telephone Encounter (Signed)
I saw her recently and she appears to be doing well and EF has improved, but having said that, she may probably benefit from home health if she needs it, but you are right, she may not do well over time. My NP is seeing her in [place] and will reassess.

## 2018-09-19 NOTE — Telephone Encounter (Signed)
Patients daughter [name] called nurse line to give an update on patient. [name] stated, "shes just not doing well." Patient feels heaviness on her chest and SOB, which is not a new thing. [name] stated she believes her mom is aware death is near and is starting to get anxious. Patient is refusing to stay anywhere other than her own home and bed, despite her family trying to get her to stay with one of them. [name] would like to know your thoughts on hospice care and would like something for patient for anxiety during the day. Please advise.

## 2018-09-19 NOTE — Telephone Encounter (Signed)
Spoke with daughter Jodi [name] is having frequent feelings of shortness of breath.  Not acute  Suggested she can Dr Einar Gip office for evaluation to see if related to her heart or if any therapies can relieve it.  If is not cardiac then we may be able to treat with anxiety medication.  She should make an appointment to speak with [place].  She agrees

## 2018-09-29 ENCOUNTER — Telehealth: Payer: Self-pay | Admitting: *Deleted

## 2018-09-29 DIAGNOSIS — I5042 Chronic combined systolic (congestive) and diastolic (congestive) heart failure: Secondary | ICD-10-CM

## 2018-09-29 NOTE — Telephone Encounter (Signed)
[name] is calling requesting PCS for pt.  She would like for her mom to stay at home as long as possible, but feels she needs help with bathing, dressing, meals and housework.   States that she has little energy and "little jobs around the house wear her out even more".  Mom does not want to given up independence yet but [name] feels like she definitely needs someone with her.  Maybe even at night as well.  Explained that would be determined by [facility].  PCS form started and placed in MDs box for completion. Christen Bame, CMA

## 2018-09-29 NOTE — Telephone Encounter (Signed)
Left generic voicemail for Ms. Jodi [name] to call back regarding questions for [name]----I have some specific questions while completing this form.  Dorris Singh, MD  Family Medicine Teaching Service

## 2018-10-01 NOTE — Addendum Note (Signed)
Addended by: Owens Shark, Ercia Crisafulli on: 10/01/2018 01:46 PM   Modules accepted: Orders

## 2018-10-01 NOTE — Telephone Encounter (Signed)
Called patient's daughter [name])  to discuss home caregiver.  The patient is currently living in [place] but moving back to [place] on Friday.  While in [place] the patient fell a few weeks ago while getting up from the toilet.  She is able to ambulate after the incident.  She did not hit her head.    The patient has had some progression in her cognitive impairment of the last few months per the family.  Both the patient's daughter and 2 siblings to care for her report a change in memory.  She is experiencing falls at times.  She is forgetting to take her medications.  The daughter is very concerned about sundowning.  She requests Xanax or Ativan for this.  She would like a medication to help prevent sundowning as this causes anxiety for her mom.  We discussed I would not recommend the use.  We will leave changes in medications to Dr. Erin Hearing.  The patient currently takes Ambien.  Paperwork for personal care service completed.  Patient requires 24-hour assistance at this time due to sundowning, wandering at night and her medical conditions related to her heart failure.  She has frequent dosing of medications and requires assistance with taking these now.  [name] also requests hospital bed. Patient requires sleeping in recliner now related to HFrEF. Order placed.  Form completed, given to [employer].   Dorris Singh, MD  Family Medicine Teaching Service

## 2018-10-01 NOTE — Telephone Encounter (Signed)
Form faxed to [facility] and copy made for batch scanning.  Christen Bame, CMA

## 2018-10-03 NOTE — Telephone Encounter (Signed)
P daughter called informed to her medication. Pt daughter angel mention that she is taking Laxis 80 mg, expect today she took 40mg , and pt is still with lots of fluid. Daughters pt would like to know what should she do. Please advise. Thank you.

## 2018-10-04 ENCOUNTER — Telehealth (INDEPENDENT_AMBULATORY_CARE_PROVIDER_SITE_OTHER): Payer: Medicare Other | Admitting: Cardiology

## 2018-10-04 ENCOUNTER — Telehealth: Payer: Self-pay | Admitting: Cardiology

## 2018-10-04 DIAGNOSIS — I5042 Chronic combined systolic (congestive) and diastolic (congestive) heart failure: Secondary | ICD-10-CM

## 2018-10-04 DIAGNOSIS — I5041 Acute combined systolic (congestive) and diastolic (congestive) heart failure: Secondary | ICD-10-CM

## 2018-10-04 MED ORDER — METOLAZONE 5 MG PO TABS: 5.0000 mg | ORAL_TABLET | Freq: Every day | ORAL | 2 refills | Status: DC

## 2018-10-04 NOTE — Telephone Encounter (Signed)
Patient's daughter called.  Patient has been experiencing leg edema for last several days.  Denies shortness of breath at rest.  Known valvular cardiomyopathy, not a candidate for intervention.  Patient is currently taking Lasix 80 mg daily, which is higher than her baseline dose of 40 mg daily.  Recommended increasing Lasix to 80 mg daily for next 3 days.  Will need to address again in the coming week regarding office visit versus hospitalization for IV diuresis.  Daughter verbalized understanding.  Nigel Mormon, MD

## 2018-10-04 NOTE — Telephone Encounter (Signed)
Patient with recurrence of acute decompensated heart failure, acute on chronic diastolic heart failure, we'll add metolazone 5 mg daily for 5 days to be taken on a p.r.n. basis.  We'll set her up to be seen in the next one week.

## 2018-10-04 NOTE — Telephone Encounter (Signed)
I called the patient's home, spoke to her 1st daughter and then her 2nd daughter in general, explained to them regarding use of diuretics.  We'll reduce the furosemide dose to 40 mg b.i.d. but I have added metolazone 5 mg daily to be taken on a p.r.n. basis but she will take it on a daily basis for 5 days.  Effects of the medication and renal failure discussed.  Patient's children are all aware of poor prognosis.  I will set up an appointment to see her within the next one week however she is advised to contact us if she has no improvement in her symptoms over this weekend. This was a 10 minute telephone encounter

## 2018-10-06 ENCOUNTER — Telehealth: Payer: Self-pay

## 2018-10-06 NOTE — Telephone Encounter (Signed)
Pt daughter called stating metolozone is not working; She says that's she barely goes to the bathroom and she hasn't seen any changes in her legs at all and she wants to know if you need to make any changes  This was this morning when she called, I sent this msg to [place] he didn't respond yet

## 2018-10-06 NOTE — Telephone Encounter (Signed)
Is your schedule fine?

## 2018-10-06 NOTE — Telephone Encounter (Signed)
Can you call pt and schedule this for me ? Thanks

## 2018-10-06 NOTE — Telephone Encounter (Signed)
Pt daughter called stating metolozone is not working; She says that's she barely goes to the bathroom and she hasn't seen any changes in her legs at all and she wants to know if you need to make any changes   [phone]

## 2018-10-06 NOTE — Telephone Encounter (Signed)
Hospital admit or I can have home  health visiting nurse

## 2018-10-06 NOTE — Telephone Encounter (Signed)
Please call and check on patient and see if she needs to make an appt this week.

## 2018-10-07 ENCOUNTER — Other Ambulatory Visit: Payer: Self-pay

## 2018-10-07 ENCOUNTER — Emergency Department (HOSPITAL_COMMUNITY): Payer: Medicare Other

## 2018-10-07 ENCOUNTER — Inpatient Hospital Stay (HOSPITAL_COMMUNITY)
Admission: EM | Admit: 2018-10-07 | Discharge: 2018-10-11 | DRG: 291 | Disposition: A | Discharge status: Home Health | Payer: Medicare Other | Attending: Family Medicine | Admitting: Family Medicine

## 2018-10-07 ENCOUNTER — Encounter (HOSPITAL_COMMUNITY): Payer: Self-pay

## 2018-10-07 DIAGNOSIS — M159 Polyosteoarthritis, unspecified: Secondary | ICD-10-CM | POA: Diagnosis present

## 2018-10-07 DIAGNOSIS — M8949 Other hypertrophic osteoarthropathy, multiple sites: Secondary | ICD-10-CM | POA: Diagnosis present

## 2018-10-07 DIAGNOSIS — I5041 Acute combined systolic (congestive) and diastolic (congestive) heart failure: Secondary | ICD-10-CM | POA: Diagnosis present

## 2018-10-07 DIAGNOSIS — I130 Hypertensive heart and chronic kidney disease with heart failure and stage 1 through stage 4 chronic kidney disease, or unspecified chronic kidney disease: Principal | ICD-10-CM | POA: Diagnosis present

## 2018-10-07 DIAGNOSIS — R4182 Altered mental status, unspecified: Secondary | ICD-10-CM

## 2018-10-07 DIAGNOSIS — N183 Chronic kidney disease, stage 3 unspecified: Secondary | ICD-10-CM | POA: Diagnosis present

## 2018-10-07 DIAGNOSIS — Z9841 Cataract extraction status, right eye: Secondary | ICD-10-CM

## 2018-10-07 DIAGNOSIS — E7800 Pure hypercholesterolemia, unspecified: Secondary | ICD-10-CM | POA: Diagnosis present

## 2018-10-07 DIAGNOSIS — I2510 Atherosclerotic heart disease of native coronary artery without angina pectoris: Secondary | ICD-10-CM | POA: Diagnosis present

## 2018-10-07 DIAGNOSIS — Z823 Family history of stroke: Secondary | ICD-10-CM

## 2018-10-07 DIAGNOSIS — R0602 Shortness of breath: Secondary | ICD-10-CM | POA: Diagnosis not present

## 2018-10-07 DIAGNOSIS — Z955 Presence of coronary angioplasty implant and graft: Secondary | ICD-10-CM

## 2018-10-07 DIAGNOSIS — I252 Old myocardial infarction: Secondary | ICD-10-CM

## 2018-10-07 DIAGNOSIS — Z85528 Personal history of other malignant neoplasm of kidney: Secondary | ICD-10-CM

## 2018-10-07 DIAGNOSIS — Z7901 Long term (current) use of anticoagulants: Secondary | ICD-10-CM

## 2018-10-07 DIAGNOSIS — Z90710 Acquired absence of both cervix and uterus: Secondary | ICD-10-CM

## 2018-10-07 DIAGNOSIS — Z66 Do not resuscitate: Secondary | ICD-10-CM | POA: Diagnosis present

## 2018-10-07 DIAGNOSIS — Z882 Allergy status to sulfonamides status: Secondary | ICD-10-CM

## 2018-10-07 DIAGNOSIS — I129 Hypertensive chronic kidney disease with stage 1 through stage 4 chronic kidney disease, or unspecified chronic kidney disease: Secondary | ICD-10-CM | POA: Diagnosis not present

## 2018-10-07 DIAGNOSIS — I493 Ventricular premature depolarization: Secondary | ICD-10-CM | POA: Diagnosis present

## 2018-10-07 DIAGNOSIS — K5900 Constipation, unspecified: Secondary | ICD-10-CM | POA: Diagnosis not present

## 2018-10-07 DIAGNOSIS — I4821 Permanent atrial fibrillation: Secondary | ICD-10-CM | POA: Diagnosis not present

## 2018-10-07 DIAGNOSIS — I509 Heart failure, unspecified: Secondary | ICD-10-CM

## 2018-10-07 DIAGNOSIS — S3992XA Unspecified injury of lower back, initial encounter: Secondary | ICD-10-CM | POA: Diagnosis not present

## 2018-10-07 DIAGNOSIS — E1122 Type 2 diabetes mellitus with diabetic chronic kidney disease: Secondary | ICD-10-CM | POA: Diagnosis present

## 2018-10-07 DIAGNOSIS — I10 Essential (primary) hypertension: Secondary | ICD-10-CM | POA: Diagnosis present

## 2018-10-07 DIAGNOSIS — Y92009 Unspecified place in unspecified non-institutional (private) residence as the place of occurrence of the external cause: Secondary | ICD-10-CM

## 2018-10-07 DIAGNOSIS — I350 Nonrheumatic aortic (valve) stenosis: Secondary | ICD-10-CM

## 2018-10-07 DIAGNOSIS — I272 Pulmonary hypertension, unspecified: Secondary | ICD-10-CM | POA: Diagnosis present

## 2018-10-07 DIAGNOSIS — M533 Sacrococcygeal disorders, not elsewhere classified: Secondary | ICD-10-CM | POA: Diagnosis not present

## 2018-10-07 DIAGNOSIS — Z961 Presence of intraocular lens: Secondary | ICD-10-CM | POA: Diagnosis present

## 2018-10-07 DIAGNOSIS — I959 Hypotension, unspecified: Secondary | ICD-10-CM | POA: Diagnosis present

## 2018-10-07 DIAGNOSIS — R7989 Other specified abnormal findings of blood chemistry: Secondary | ICD-10-CM | POA: Diagnosis not present

## 2018-10-07 DIAGNOSIS — G4700 Insomnia, unspecified: Secondary | ICD-10-CM | POA: Diagnosis present

## 2018-10-07 DIAGNOSIS — E876 Hypokalemia: Secondary | ICD-10-CM | POA: Diagnosis present

## 2018-10-07 DIAGNOSIS — Z9842 Cataract extraction status, left eye: Secondary | ICD-10-CM

## 2018-10-07 DIAGNOSIS — G2581 Restless legs syndrome: Secondary | ICD-10-CM | POA: Diagnosis present

## 2018-10-07 DIAGNOSIS — I5021 Acute systolic (congestive) heart failure: Secondary | ICD-10-CM | POA: Diagnosis present

## 2018-10-07 DIAGNOSIS — Z20828 Contact with and (suspected) exposure to other viral communicable diseases: Secondary | ICD-10-CM | POA: Diagnosis present

## 2018-10-07 DIAGNOSIS — Z9181 History of falling: Secondary | ICD-10-CM

## 2018-10-07 DIAGNOSIS — W010XXA Fall on same level from slipping, tripping and stumbling without subsequent striking against object, initial encounter: Secondary | ICD-10-CM | POA: Diagnosis present

## 2018-10-07 DIAGNOSIS — I5043 Acute on chronic combined systolic (congestive) and diastolic (congestive) heart failure: Secondary | ICD-10-CM | POA: Diagnosis present

## 2018-10-07 DIAGNOSIS — I080 Rheumatic disorders of both mitral and aortic valves: Secondary | ICD-10-CM | POA: Diagnosis present

## 2018-10-07 DIAGNOSIS — E785 Hyperlipidemia, unspecified: Secondary | ICD-10-CM | POA: Diagnosis present

## 2018-10-07 DIAGNOSIS — Z79899 Other long term (current) drug therapy: Secondary | ICD-10-CM

## 2018-10-07 DIAGNOSIS — Z8673 Personal history of transient ischemic attack (TIA), and cerebral infarction without residual deficits: Secondary | ICD-10-CM

## 2018-10-07 DIAGNOSIS — E1165 Type 2 diabetes mellitus with hyperglycemia: Secondary | ICD-10-CM | POA: Diagnosis present

## 2018-10-07 DIAGNOSIS — I480 Paroxysmal atrial fibrillation: Secondary | ICD-10-CM | POA: Diagnosis present

## 2018-10-07 DIAGNOSIS — Z806 Family history of leukemia: Secondary | ICD-10-CM

## 2018-10-07 DIAGNOSIS — Z951 Presence of aortocoronary bypass graft: Secondary | ICD-10-CM

## 2018-10-07 DIAGNOSIS — Z8249 Family history of ischemic heart disease and other diseases of the circulatory system: Secondary | ICD-10-CM

## 2018-10-07 DIAGNOSIS — Z91048 Other nonmedicinal substance allergy status: Secondary | ICD-10-CM

## 2018-10-07 LAB — CBC WITH DIFFERENTIAL/PLATELET
Abs Immature Granulocytes: 0.03 10*3/uL (ref 0.00–0.07)
Basophils Absolute: 0.1 10*3/uL (ref 0.0–0.1)
Basophils Relative: 1 %
Eosinophils Absolute: 0.2 10*3/uL (ref 0.0–0.5)
Eosinophils Relative: 2 %
HCT: 33.4 % — ABNORMAL LOW (ref 36.0–46.0)
Hemoglobin: 10.7 g/dL — ABNORMAL LOW (ref 12.0–15.0)
Immature Granulocytes: 0 %
Lymphocytes Relative: 22 %
Lymphs Abs: 1.9 10*3/uL (ref 0.7–4.0)
MCH: 29.3 pg (ref 26.0–34.0)
MCHC: 32 g/dL (ref 30.0–36.0)
MCV: 91.5 fL (ref 80.0–100.0)
Monocytes Absolute: 0.8 10*3/uL (ref 0.1–1.0)
Monocytes Relative: 10 %
Neutro Abs: 5.7 10*3/uL (ref 1.7–7.7)
Neutrophils Relative %: 65 %
Platelets: 192 10*3/uL (ref 150–400)
RBC: 3.65 MIL/uL — ABNORMAL LOW (ref 3.87–5.11)
RDW: 15.2 % (ref 11.5–15.5)
WBC: 8.8 10*3/uL (ref 4.0–10.5)
nRBC: 0 % (ref 0.0–0.2)

## 2018-10-07 LAB — CREATININE, SERUM
Creatinine, Ser: 2.02 mg/dL — ABNORMAL HIGH (ref 0.44–1.00)
GFR calc Af Amer: 26 mL/min — ABNORMAL LOW (ref >60)
GFR calc non Af Amer: 22 mL/min — ABNORMAL LOW (ref >60)

## 2018-10-07 LAB — BASIC METABOLIC PANEL
Anion gap: 13 (ref 5–15)
BUN: 50 mg/dL — ABNORMAL HIGH (ref 8–23)
CO2: 23 mmol/L (ref 22–32)
Calcium: 9.6 mg/dL (ref 8.9–10.3)
Chloride: 102 mmol/L (ref 98–111)
Creatinine, Ser: 2.16 mg/dL — ABNORMAL HIGH (ref 0.44–1.00)
GFR calc Af Amer: 24 mL/min — ABNORMAL LOW (ref >60)
GFR calc non Af Amer: 21 mL/min — ABNORMAL LOW (ref >60)
Glucose, Bld: 131 mg/dL — ABNORMAL HIGH (ref 70–99)
Potassium: 4.1 mmol/L (ref 3.5–5.1)
Sodium: 138 mmol/L (ref 135–145)

## 2018-10-07 LAB — URINALYSIS, ROUTINE W REFLEX MICROSCOPIC
Bilirubin Urine: NEGATIVE
Glucose, UA: NEGATIVE mg/dL
Hgb urine dipstick: NEGATIVE
Ketones, ur: NEGATIVE mg/dL
Leukocytes,Ua: NEGATIVE
Nitrite: NEGATIVE
Protein, ur: NEGATIVE mg/dL
Specific Gravity, Urine: 1.009 (ref 1.005–1.030)
pH: 6 (ref 5.0–8.0)

## 2018-10-07 LAB — SARS CORONAVIRUS 2 (TAT 6-24 HRS): SARS Coronavirus 2: NEGATIVE

## 2018-10-07 LAB — D-DIMER, QUANTITATIVE: D-Dimer, Quant: 2.06 ug/mL-FEU — ABNORMAL HIGH (ref 0.00–0.50)

## 2018-10-07 LAB — CBC
HCT: 37 % (ref 36.0–46.0)
Hemoglobin: 11.8 g/dL — ABNORMAL LOW (ref 12.0–15.0)
MCH: 29.5 pg (ref 26.0–34.0)
MCHC: 31.9 g/dL (ref 30.0–36.0)
MCV: 92.5 fL (ref 80.0–100.0)
Platelets: 208 10*3/uL (ref 150–400)
RBC: 4 MIL/uL (ref 3.87–5.11)
RDW: 15.3 % (ref 11.5–15.5)
WBC: 9.3 10*3/uL (ref 4.0–10.5)
nRBC: 0 % (ref 0.0–0.2)

## 2018-10-07 LAB — TROPONIN I (HIGH SENSITIVITY)
Troponin I (High Sensitivity): 46 ng/L — ABNORMAL HIGH (ref <18)
Troponin I (High Sensitivity): 48 ng/L — ABNORMAL HIGH (ref <18)
Troponin I (High Sensitivity): 48 ng/L — ABNORMAL HIGH (ref <18)
Troponin I (High Sensitivity): 51 ng/L — ABNORMAL HIGH (ref <18)

## 2018-10-07 LAB — MAGNESIUM: Magnesium: 2.5 mg/dL — ABNORMAL HIGH (ref 1.7–2.4)

## 2018-10-07 LAB — BRAIN NATRIURETIC PEPTIDE: B Natriuretic Peptide: 1241.4 pg/mL — ABNORMAL HIGH (ref 0.0–100.0)

## 2018-10-07 MED ORDER — VITAMIN B-12 1000 MCG PO TABS
1000.0000 ug | ORAL_TABLET | Freq: Every day | ORAL | Status: DC
  Administered 2018-10-08 – 2018-10-11 (×4): 1000 ug via ORAL
  Filled 2018-10-07 (×4): qty 1

## 2018-10-07 MED ORDER — FUROSEMIDE 10 MG/ML IJ SOLN
80.0000 mg | Freq: Two times a day (BID) | INTRAMUSCULAR | Status: DC
  Administered 2018-10-08 – 2018-10-09 (×3): 80 mg via INTRAVENOUS
  Filled 2018-10-07 (×3): qty 8

## 2018-10-07 MED ORDER — SODIUM CHLORIDE 0.9 % IV SOLN: 250.0000 mL | INTRAVENOUS | Status: DC | PRN

## 2018-10-07 MED ORDER — ACETAMINOPHEN 325 MG PO TABS
650.0000 mg | ORAL_TABLET | ORAL | Status: DC | PRN
  Administered 2018-10-07 – 2018-10-11 (×9): 650 mg via ORAL
  Filled 2018-10-07 (×9): qty 2

## 2018-10-07 MED ORDER — VITAMIN D 25 MCG (1000 UNIT) PO TABS
2000.0000 [IU] | ORAL_TABLET | Freq: Every day | ORAL | Status: DC
  Administered 2018-10-08 – 2018-10-11 (×4): 2000 [IU] via ORAL
  Filled 2018-10-07 (×4): qty 2

## 2018-10-07 MED ORDER — FUROSEMIDE 10 MG/ML IJ SOLN
40.0000 mg | Freq: Once | INTRAMUSCULAR | Status: AC
  Administered 2018-10-07: 40 mg via INTRAVENOUS
  Filled 2018-10-07: qty 4

## 2018-10-07 MED ORDER — ZOLPIDEM TARTRATE 5 MG PO TABS: 5.0000 mg | ORAL_TABLET | Freq: Every day | ORAL | Status: DC

## 2018-10-07 MED ORDER — ACETAMINOPHEN 325 MG PO TABS
650.0000 mg | ORAL_TABLET | Freq: Once | ORAL | Status: AC
  Administered 2018-10-07: 12:00:00 650 mg via ORAL
  Filled 2018-10-07: qty 2

## 2018-10-07 MED ORDER — TECHNETIUM TC 99M DIETHYLENETRIAME-PENTAACETIC ACID
30.6000 | Freq: Once | INTRAVENOUS | Status: AC | PRN
  Administered 2018-10-07: 15:00:00 30.6 via RESPIRATORY_TRACT

## 2018-10-07 MED ORDER — FUROSEMIDE 10 MG/ML IJ SOLN: 40.0000 mg | Freq: Once | INTRAMUSCULAR | Status: DC

## 2018-10-07 MED ORDER — TECHNETIUM TO 99M ALBUMIN AGGREGATED
1.5000 | Freq: Once | INTRAVENOUS | Status: AC | PRN
  Administered 2018-10-07: 15:00:00 1.5 via INTRAVENOUS

## 2018-10-07 MED ORDER — METOPROLOL TARTRATE 12.5 MG HALF TABLET
12.5000 mg | ORAL_TABLET | Freq: Two times a day (BID) | ORAL | Status: DC
  Administered 2018-10-07 – 2018-10-11 (×8): 12.5 mg via ORAL
  Filled 2018-10-07 (×8): qty 1

## 2018-10-07 MED ORDER — ZOLPIDEM TARTRATE 5 MG PO TABS
5.0000 mg | ORAL_TABLET | Freq: Every day | ORAL | Status: DC
  Administered 2018-10-07: 5 mg via ORAL
  Filled 2018-10-07: qty 1

## 2018-10-07 MED ORDER — SPIRONOLACTONE 12.5 MG HALF TABLET
12.5000 mg | ORAL_TABLET | Freq: Every day | ORAL | Status: DC
  Administered 2018-10-08 – 2018-10-11 (×4): 12.5 mg via ORAL
  Filled 2018-10-07 (×4): qty 1

## 2018-10-07 MED ORDER — METOLAZONE 5 MG PO TABS: 5.0000 mg | ORAL_TABLET | Freq: Every day | ORAL | Status: DC

## 2018-10-07 MED ORDER — SODIUM CHLORIDE 0.9% FLUSH: 3.0000 mL | INTRAVENOUS | Status: DC | PRN

## 2018-10-07 MED ORDER — TURMERIC 500 MG PO CAPS: 500.0000 mg | ORAL_CAPSULE | Freq: Every day | ORAL | Status: DC

## 2018-10-07 MED ORDER — METOLAZONE 5 MG PO TABS
5.0000 mg | ORAL_TABLET | ORAL | Status: DC
  Administered 2018-10-08 – 2018-10-09 (×2): 5 mg via ORAL
  Filled 2018-10-07 (×2): qty 1

## 2018-10-07 MED ORDER — SODIUM CHLORIDE 0.9% FLUSH
3.0000 mL | Freq: Two times a day (BID) | INTRAVENOUS | Status: DC
  Administered 2018-10-07 – 2018-10-11 (×7): 3 mL via INTRAVENOUS

## 2018-10-07 MED ORDER — ISOSORB DINITRATE-HYDRALAZINE 20-37.5 MG PO TABS
2.0000 | ORAL_TABLET | Freq: Three times a day (TID) | ORAL | Status: DC
  Administered 2018-10-07 – 2018-10-11 (×11): 2 via ORAL
  Filled 2018-10-07 (×11): qty 2

## 2018-10-07 MED ORDER — APIXABAN 2.5 MG PO TABS
2.5000 mg | ORAL_TABLET | Freq: Two times a day (BID) | ORAL | Status: DC
  Administered 2018-10-07 – 2018-10-11 (×8): 2.5 mg via ORAL
  Filled 2018-10-07 (×8): qty 1

## 2018-10-07 MED ORDER — ONDANSETRON HCL 4 MG/2ML IJ SOLN: 4.0000 mg | Freq: Four times a day (QID) | INTRAMUSCULAR | Status: DC | PRN

## 2018-10-07 MED ORDER — ALBUTEROL SULFATE (2.5 MG/3ML) 0.083% IN NEBU: 3.0000 mL | INHALATION_SOLUTION | Freq: Four times a day (QID) | RESPIRATORY_TRACT | Status: DC | PRN

## 2018-10-07 MED ORDER — ROSUVASTATIN CALCIUM 20 MG PO TABS
20.0000 mg | ORAL_TABLET | Freq: Every day | ORAL | Status: DC
  Administered 2018-10-08 – 2018-10-11 (×4): 20 mg via ORAL
  Filled 2018-10-07 (×4): qty 1

## 2018-10-07 MED ORDER — ADULT MULTIVITAMIN W/MINERALS CH
1.0000 | ORAL_TABLET | Freq: Every day | ORAL | Status: DC
  Administered 2018-10-08 – 2018-10-11 (×4): 1 via ORAL
  Filled 2018-10-07 (×4): qty 1

## 2018-10-07 MED ORDER — MUSCLE RUB 10-15 % EX CREA
TOPICAL_CREAM | Freq: Four times a day (QID) | CUTANEOUS | Status: DC | PRN
  Administered 2018-10-08 – 2018-10-11 (×4): via TOPICAL
  Filled 2018-10-07 (×2): qty 85

## 2018-10-07 NOTE — Telephone Encounter (Signed)
As of today, patient is hospitalized.

## 2018-10-07 NOTE — Telephone Encounter (Signed)
Pt daughter called and she wants to let you know that she is taking her mom to ED she is still having edema

## 2018-10-07 NOTE — Progress Notes (Signed)
PHARMACIST - PHYSICIAN ORDER COMMUNICATION  CONCERNING: P&T Medication Policy on Herbal Medications  DESCRIPTION:  This patient's order for:  Tumeric  has been noted.  This product(s) is classified as an "herbal" or natural product. Due to a lack of definitive safety studies or FDA approval, nonstandard manufacturing practices, plus the potential risk of unknown drug-drug interactions while on inpatient medications, the Pharmacy and Therapeutics Committee does not permit the use of "herbal" or natural products of this type within [facility].   ACTION TAKEN: The pharmacy department is unable to verify this order at this time and your patient has been informed of this safety policy. Please reevaluate patient's clinical condition at discharge and address if the herbal or natural product(s) should be resumed at that time.   Thank you for allowing pharmacy to be a part of this patient's care.  Alycia Rossetti, PharmD, BCPS Clinical Pharmacist Clinical phone for 10/07/2018: [phone] 10/07/2018 6:35 PM   **Pharmacist phone directory can now be found on [email or web address] (PW [email or web address]).  Listed under [place].

## 2018-10-07 NOTE — ED Notes (Signed)
Patient transported to [facility]

## 2018-10-07 NOTE — ED Notes (Signed)
Report attempted 

## 2018-10-07 NOTE — ED Triage Notes (Signed)
Patient complains of increasing SOB since yesterday. States that she was unable to sleep last night due to same. Unable to lay flat. Patient speaking complete sentences on arrival. States taking lasix as directed with no relief, has not weighed to see if weight gain

## 2018-10-07 NOTE — ED Notes (Signed)
ED TO INPATIENT HANDOFF REPORT  ED Nurse Name and Phone #: Benjamine Mola [phone]  S Name/Age/Gender Jodi [name] 85 y.o. female Room/Bed: 020C/020C  Code Status   Code Status: DNR  Home/SNF/Other Home Patient oriented to: self, place, time and situation Is this baseline? Yes   Triage Complete: Triage complete  Chief Complaint sob  Triage Note Patient complains of increasing SOB since yesterday. States that she was unable to sleep last night due to same. Unable to lay flat. Patient speaking complete sentences on arrival. States taking lasix as directed with no relief, has not weighed to see if weight gain   Allergies Allergies  Allergen Reactions  . Sulfamethoxazole-Trimethoprim Other (See Comments)    Caused shaking and chills  . Tape Other (See Comments)    Tears skin.  Please use "paper" tape    Level of Care/Admitting Diagnosis ED Disposition    ED Disposition Condition [place]: [place] [100100]  Level of Care: Telemetry Cardiac [103]  I expect the patient will be discharged within 24 hours: No (not a candidate for 5C-Observation unit)  Covid Evaluation: Asymptomatic Screening Protocol (No Symptoms)  Diagnosis: Acute systolic (congestive) heart failure [facility]) [[id number]]  Admitting Physician: Lady Deutscher [[id number]]  Attending Physician: Lady Deutscher [[id number]]  PT Class (Do Not Modify): Observation [104]  PT Acc Code (Do Not Modify): Observation [10022]       B Medical/Surgery History Past Medical History:  Diagnosis Date  . Aortic stenosis   . Arthritis    "maybe in my fingers and toes" (09/28/2014)  . Bradycardia   . CHF (congestive heart failure) ([place])   . Chronic renal insufficiency, stage III (moderate) ([place])    [name] 09/27/2014  . Coronary artery disease   . Heart murmur   . Hyperlipidemia   . Hypertension   . Macular degeneration, left eye   . Myocardial infarction [facility]) 1995; 2003  .  Paroxysmal atrial fibrillation (HCC)   . Renal cell carcinoma ([place]) 09/23/2012  . Renal mass 12/06/2010   CT Abdomen 11-27-10 upper pole region of the right kidney which is suspicious for solid lesion, measuring 1.9 x 1.3 cm. This lesion is concerning for renal cell carcinoma given the solid appearance.  Following with Dr [name].  Renal bx was benign    . Shortness of breath   . Splenic infarct 12/06/2010  . Stroke [facility]) 2003   "when I had heart surgery"; denies residual on 09/28/2014  . TIA (transient ischemic attack) "several"   Past Surgical History:  Procedure Laterality Date  . CARDIAC CATHETERIZATION    . CARDIOVERSION N/A 10/20/2013   Procedure: CARDIOVERSION;  Surgeon: Laverda Page, MD;  Location: [place];  Service: Cardiovascular;  Laterality: N/A;  H&P in file  . CARDIOVERSION N/A 05/18/2014   Procedure: CARDIOVERSION;  Surgeon: [name], MD;  Location: [facility] ENDOSCOPY;  Service: Cardiovascular;  Laterality: N/A;  . CARDIOVERSION N/A 10/31/2016   Procedure: CARDIOVERSION;  Surgeon: [name], MD;  Location: [facility] ENDOSCOPY;  Service: Cardiovascular;  Laterality: N/A;  . CARDIOVERSION N/A 11/20/2016   Procedure: CARDIOVERSION;  Surgeon: [name], MD;  Location: [place];  Service: Cardiovascular;  Laterality: N/A;  . CATARACT EXTRACTION W/ INTRAOCULAR LENS  IMPLANT, BILATERAL Bilateral ~ 2013  . CORONARY ANGIOPLASTY WITH STENT PLACEMENT  2002  . CORONARY ARTERY BYPASS GRAFT  1995 and 2003  . IR RADIOLOGIST EVAL & MGMT  07/03/2016  . LEFT AND RIGHT HEART CATHETERIZATION WITH  CORONARY ANGIOGRAM N/A 11/24/2013   Procedure: LEFT AND RIGHT HEART CATHETERIZATION WITH CORONARY ANGIOGRAM;  Surgeon: Laverda Page, MD;  Location: [facility] CATH LAB;  Service: Cardiovascular;  Laterality: N/A;  . PERCUTANEOUS NEEDLE BIOPSY OF RENAL LESION  ?2014  . TOTAL ABDOMINAL HYSTERECTOMY  1990's   "both ovaries were full of little tiny sores"  . TYMPANOPLASTY Bilateral    "had holes in them;  still have holes in them"     A IV Location/Drains/Wounds Patient Lines/Drains/Airways Status   Active Line/Drains/Airways    Name:   Placement date:   Placement time:   Site:   Days:   Peripheral IV 10/07/18 Left Antecubital   10/07/18    1100    Antecubital   less than 1          Intake/Output Last 24 hours No intake or output data in the 24 hours ending 10/07/18 1724  Labs/Imaging Results for orders placed or performed during the hospital encounter of 10/07/18 (from the past 48 hour(s))  CBC with Differential     Status: Abnormal   Collection Time: 10/07/18 10:57 AM  Result Value Ref Range   WBC 8.8 4.0 - 10.5 K/uL   RBC 3.65 (L) 3.87 - 5.11 MIL/uL   Hemoglobin 10.7 (L) 12.0 - 15.0 g/dL   HCT 33.4 (L) 36.0 - 46.0 %   MCV 91.5 80.0 - 100.0 fL   MCH 29.3 26.0 - 34.0 pg   MCHC 32.0 30.0 - 36.0 g/dL   RDW 15.2 11.5 - 15.5 %   Platelets 192 150 - 400 K/uL   nRBC 0.0 0.0 - 0.2 %   Neutrophils Relative % 65 %   Neutro Abs 5.7 1.7 - 7.7 K/uL   Lymphocytes Relative 22 %   Lymphs Abs 1.9 0.7 - 4.0 K/uL   Monocytes Relative 10 %   Monocytes Absolute 0.8 0.1 - 1.0 K/uL   Eosinophils Relative 2 %   Eosinophils Absolute 0.2 0.0 - 0.5 K/uL   Basophils Relative 1 %   Basophils Absolute 0.1 0.0 - 0.1 K/uL   Immature Granulocytes 0 %   Abs Immature Granulocytes 0.03 0.00 - 0.07 K/uL    Comment: Performed at [facility], [address] [place], [place] [address]  D-dimer, quantitative     Status: Abnormal   Collection Time: 10/07/18 10:57 AM  Result Value Ref Range   D-Dimer, Quant 2.06 (H) 0.00 - 0.50 ug/mL-FEU    Comment: (NOTE) At the manufacturer cut-off of 0.50 ug/mL FEU, this assay has been documented to exclude PE with a sensitivity and negative predictive value of 97 to 99%.  At this time, this assay has not been approved by the FDA to exclude DVT/VTE. Results should be correlated with clinical presentation. Performed at [facility], [place] [address],  [place], [place] [address]   Basic metabolic panel     Status: Abnormal   Collection Time: 10/07/18 10:57 AM  Result Value Ref Range   Sodium 138 135 - 145 mmol/L   Potassium 4.1 3.5 - 5.1 mmol/L   Chloride 102 98 - 111 mmol/L   CO2 23 22 - 32 mmol/L   Glucose, Bld 131 (H) 70 - 99 mg/dL   BUN 50 (H) 8 - 23 mg/dL   Creatinine, Ser 2.16 (H) 0.44 - 1.00 mg/dL   Calcium 9.6 8.9 - 10.3 mg/dL   GFR calc non Af Amer 21 (L) >60 mL/min   GFR calc Af Amer 24 (L) >  60 mL/min   Anion gap 13 5 - 15    Comment: Performed at [facility] [address] [place], [place] [address]  Troponin I (High Sensitivity)     Status: Abnormal   Collection Time: 10/07/18 10:57 AM  Result Value Ref Range   Troponin I (High Sensitivity) 46 (H) <18 ng/L    Comment: (NOTE) Elevated high sensitivity troponin I (hsTnI) values and significant  changes across serial measurements may suggest ACS but many other  chronic and acute conditions are known to elevate hsTnI results.  Refer to the "Links" section for chest pain algorithms and additional  guidance. Performed at [facility], [place] [address] [place], [place] [address]   Brain natriuretic peptide     Status: Abnormal   Collection Time: 10/07/18 10:57 AM  Result Value Ref Range   B Natriuretic Peptide 1,241.4 (H) 0.0 - 100.0 pg/mL    Comment: Performed at [facility] [address] [place], [place] [address]  Magnesium     Status: Abnormal   Collection Time: 10/07/18 10:57 AM  Result Value Ref Range   Magnesium 2.5 (H) 1.7 - 2.4 mg/dL    Comment: Performed at [facility] [address] [place], [place] [address]  Urinalysis, Routine w reflex microscopic     Status: None   Collection Time: 10/07/18 11:24 AM  Result Value Ref Range   Color, Urine YELLOW YELLOW   APPearance CLEAR CLEAR   Specific Gravity, Urine 1.009 1.005 - 1.030   pH 6.0 5.0 - 8.0   Glucose, UA NEGATIVE NEGATIVE mg/dL   Hgb urine dipstick NEGATIVE NEGATIVE    Bilirubin Urine NEGATIVE NEGATIVE   Ketones, ur NEGATIVE NEGATIVE mg/dL   Protein, ur NEGATIVE NEGATIVE mg/dL   Nitrite NEGATIVE NEGATIVE   Leukocytes,Ua NEGATIVE NEGATIVE    Comment: Performed at [facility] [address] [place], [place] [address]  Troponin I (High Sensitivity)     Status: Abnormal   Collection Time: 10/07/18 12:53 PM  Result Value Ref Range   Troponin I (High Sensitivity) 48 (H) <18 ng/L    Comment: (NOTE) Elevated high sensitivity troponin I (hsTnI) values and significant  changes across serial measurements may suggest ACS but many other  chronic and acute conditions are known to elevate hsTnI results.  Refer to the "Links" section for chest pain algorithms and additional  guidance. Performed at [facility], [place] [address], [place], [place] [address]   Troponin I (High Sensitivity)     Status: Abnormal   Collection Time: 10/07/18  4:00 PM  Result Value Ref Range   Troponin I (High Sensitivity) 48 (H) <18 ng/L    Comment: (NOTE) Elevated high sensitivity troponin I (hsTnI) values and significant  changes across serial measurements may suggest ACS but many other  chronic and acute conditions are known to elevate hsTnI results.  Refer to the "Links" section for chest pain algorithms and additional  guidance. Performed at [facility], [place] [address] [place],  [address]    Dg Sacrum/coccyx  Result Date: 10/07/2018 CLINICAL DATA:  Recent fall with sacral pain, initial encounter EXAM: SACRUM AND COCCYX - 2+ VIEW COMPARISON:  08/17/2016 FINDINGS: Pelvic ring is intact. The sacral ala are unremarkable. No acute fracture is noted. No soft tissue changes are seen. Diffuse vascular calcifications are noted. IMPRESSION: No acute abnormality noted. Electronically Signed   By: Inez Catalina M.D.   On: 10/07/2018  10:44   Nm Pulmonary Vent And Perf (v/q Scan)  Result Date: 10/07/2018 CLINICAL DATA:  Worsening shortness of breath over the past  day. EXAM: NUCLEAR MEDICINE VENTILATION - PERFUSION LUNG SCAN TECHNIQUE: Ventilation images were obtained in multiple projections using inhaled aerosol Tc-75m DTPA. Perfusion images were obtained in multiple projections after intravenous injection of Tc-23m MAA. RADIOPHARMACEUTICALS:  30.6 mCi of Tc-59m DTPA aerosol inhalation and 1.5 mCi Tc21m MAA IV COMPARISON:  Single-view of the chest earlier today. FINDINGS: Ventilation: No focal ventilation defect. Perfusion: No wedge shaped peripheral perfusion defects to suggest acute pulmonary embolism. IMPRESSION: Negative for pulmonary embolus. Electronically Signed   By: Inge Rise M.D.   On: 10/07/2018 16:03   Dg Chest Port 1 View  Result Date: 10/07/2018 CLINICAL DATA:  Acute on chronic shortness of breath EXAM: PORTABLE CHEST 1 VIEW COMPARISON:  08/25/2018 FINDINGS: Cardiac shadow remains enlarged. Postsurgical changes are again seen and stable. The lungs are well aerated bilaterally. Small right pleural effusion is noted. Mild stable vascular congestion is noted. IMPRESSION: New small right pleural effusion. Electronically Signed   By: Inez Catalina M.D.   On: 10/07/2018 10:44    Pending Labs Unresulted Labs (From admission, onward)    Start     Ordered   10/14/18 0500  Creatinine, serum  (enoxaparin (LOVENOX)    CrCl < 30 ml/min)  Weekly,   R    Comments: while on enoxaparin therapy.    10/07/18 1705   10/08/18 9563  Basic metabolic panel  Daily,   R     10/07/18 1705   10/07/18 1700  CBC  (enoxaparin (LOVENOX)    CrCl < 30 ml/min)  Once,   STAT    Comments: Baseline for enoxaparin therapy IF NOT ALREADY DRAWN.  Notify MD if PLT < 100 K.    10/07/18 1705   10/07/18 1700  Creatinine, serum  (enoxaparin (LOVENOX)    CrCl < 30 ml/min)  Once,   STAT    Comments: Baseline for enoxaparin therapy IF NOT ALREADY DRAWN.    10/07/18 1705   10/07/18 1456  SARS CORONAVIRUS 2 (TAT 6-24 HRS) Nasopharyngeal Nasopharyngeal Swab  (Asymptomatic/Tier 2  Patients Labs)  Once,   STAT    Question Answer Comment  Is this test for diagnosis or screening Screening   Symptomatic for COVID-19 as defined by CDC No   Hospitalized for COVID-19 No   Admitted to ICU for COVID-19 No   Previously tested for COVID-19 No   Resident in a congregate (group) care setting No   Employed in healthcare setting No   Pregnant No      10/07/18 1456          Vitals/Pain Today's Vitals   10/07/18 1230 10/07/18 1343 10/07/18 1345 10/07/18 1400  BP:   (!) 160/81 (!) 143/71  Pulse:   76 69  Resp:   (!) 21 (!) 21  Temp:      TempSrc:      SpO2:   96% 96%  PainSc: 7  8       Isolation Precautions No active isolations  Medications Medications  isosorbide-hydrALAZINE (BIDIL) 20-37.5 MG per tablet 2 tablet (has no administration in time range)  metolazone (ZAROXOLYN) tablet 5 mg (has no administration in time range)  metoprolol tartrate (LOPRESSOR) tablet 12.5 mg (has no administration in time range)  rosuvastatin (CRESTOR) tablet 20 mg (has no administration in time range)  spironolactone (ALDACTONE) tablet 12.5 mg (has no administration in time range)  zolpidem (AMBIEN) tablet 5-10 mg (has no administration in time range)  apixaban (ELIQUIS) tablet 2.5 mg (has no administration in time range)  vitamin B-12 (CYANOCOBALAMIN) tablet 1,000 mcg (has no administration in time range)  Turmeric CAPS 500 mg (has no administration in time range)  Vitamin D3 TABS 2,000 Units (has no administration in time range)  multivitamin with minerals tablet 1 tablet (has no administration in time range)  albuterol (VENTOLIN HFA) 108 (90 Base) MCG/ACT inhaler 1 puff (has no administration in time range)  sodium chloride flush (NS) 0.9 % injection 3 mL (has no administration in time range)  sodium chloride flush (NS) 0.9 % injection 3 mL (has no administration in time range)  0.9 %  sodium chloride infusion (has no administration in time range)  acetaminophen (TYLENOL)  tablet 650 mg (has no administration in time range)  ondansetron (ZOFRAN) injection 4 mg (has no administration in time range)  furosemide (LASIX) injection 80 mg (has no administration in time range)  acetaminophen (TYLENOL) tablet 650 mg (650 mg Oral Given 10/07/18 1138)  technetium albumin aggregated (MAA) injection solution 1.5 millicurie (1.5 millicuries Intravenous Contrast Given 10/07/18 1450)  furosemide (LASIX) injection 40 mg (40 mg Intravenous Given 10/07/18 1611)  technetium TC 38M diethylenetriame-pentaacetic acid (DTPA) injection 56.3 millicurie (14.9 millicuries Inhalation Given 10/07/18 1518)    Mobility walks with person assist Moderate fall risk   Focused Assessments Pulmonary Assessment Handoff:  Lung sounds:   O2 Device: Room Air        R Recommendations: See Admitting Provider Note  Report given to:   Additional Notes:

## 2018-10-07 NOTE — ED Provider Notes (Addendum)
/ [place] Provider Note   CSN: [id number] Arrival date & time: 10/07/18  1157     History   Chief Complaint No chief complaint on file.   HPI Jodi [name] is a 85 y.o. female with PMHx aortic stenosis, CHF with EF 35-40% (08/26/2018), CAD, HTN, HLD, hx MI s/p CABG, CKD stage III, A fib, stroke on Eliquis who presents to the ED today ending of increasing shortness of breath for the past week.  Daughter is at bedside.  She reports that patient had a mechanical fall 4 weeks ago and since then has been having difficulty getting up and walking.  She is complaining of pain to her buttocks.  Patient recently came up from [place] to stay with daughter after the fall.  There was concern that she was not taking her medications as prescribed in [place].  Daughter reports that patient recently had her Lasix decreased from 80 mg to 40 mg BID in addition of Metalozone PRN.  Reports that this has not helped her leg edema whatsoever.  Also reports that her mom has been having difficulty sleeping at night due to anxiety.  She is concerned about her breathing and this makes her anxious.  Patient reports that they are not talking to PCP about adding Ativan as patient's brother takes this and it has helped him.  Patient denies any chest pain today. No Fevers or cough at home.  No known COVID-19 positive exposure.        Past Medical History:  Diagnosis Date  . Aortic stenosis   . Arthritis    "maybe in my fingers and toes" (09/28/2014)  . Bradycardia   . CHF (congestive heart failure) ([place])   . Chronic renal insufficiency, stage III (moderate) ([place])    [name] 09/27/2014  . Coronary artery disease   . Heart murmur   . Hyperlipidemia   . Hypertension   . Macular degeneration, left eye   . Myocardial infarction [facility]) 1995; 2003  . Paroxysmal atrial fibrillation (HCC)   . Renal cell carcinoma ([place]) 09/23/2012  . Renal mass 12/06/2010   CT Abdomen 11-27-10  upper pole region of the right kidney which is suspicious for solid lesion, measuring 1.9 x 1.3 cm. This lesion is concerning for renal cell carcinoma given the solid appearance.  Following with Dr [name].  Renal bx was benign    . Shortness of breath   . Splenic infarct 12/06/2010  . Stroke [facility]) 2003   "when I had heart surgery"; denies residual on 09/28/2014  . TIA (transient ischemic attack) "several"    Patient Active Problem List   Diagnosis Date Noted  . Acute on chronic combined systolic and diastolic congestive heart failure ([place]) 08/26/2018  . Cough 02/14/2018  . Healthcare maintenance 02/14/2018  . Recurrent UTI 01/27/2018  . Acute on chronic diastolic (congestive) heart failure ([place]) 12/04/2016  . Thyroid nodule 08/21/2016  . Hyperglycemia 11/09/2015  . Chronic combined systolic and diastolic congestive heart failure ([place]) 03/04/2015  . Left renal mass   . Paroxysmal atrial fibrillation ([place]) 08/20/2013  . Renal cell carcinoma ([place]) 09/23/2012  . Chronic kidney disease (CKD), stage III (moderate) ([place]) 02/20/2012  . Aortic stenosis, severe 12/06/2010  . SEBORRHEA 11/10/2007  . Insomnia 06/09/2007  . HYPERCHOLESTEROLEMIA 04/04/2006  . HYPERTENSION, BENIGN SYSTEMIC 04/04/2006  . CORONARY, ARTERIOSCLEROSIS 04/04/2006  . Acute combined systolic and diastolic heart failure ([place]) 04/04/2006  . Osteoarthritis involving multiple joints on both sides of body 04/04/2006  Past Surgical History:  Procedure Laterality Date  . CARDIAC CATHETERIZATION    . CARDIOVERSION N/A 10/20/2013   Procedure: CARDIOVERSION;  Surgeon: Laverda Page, MD;  Location: [place];  Service: Cardiovascular;  Laterality: N/A;  H&P in file  . CARDIOVERSION N/A 05/18/2014   Procedure: CARDIOVERSION;  Surgeon: [name], MD;  Location[facility] ENDOSCOPY;  Service: Cardiovascular;  Laterality: N/A;  . CARDIOVERSION N/A 10/31/2016   Procedure: CARDIOVERSION;  Surgeon: [name], MD;  Location: [name]  ENDOSCOPY;  Service: Cardiovascular;  Laterality: N/A;  . CARDIOVERSION N/A 11/20/2016   Procedure: CARDIOVERSION;  Surgeon: [name], MD;  Location: [place];  Service: Cardiovascular;  Laterality: N/A;  . CATARACT EXTRACTION W/ INTRAOCULAR LENS  IMPLANT, BILATERAL Bilateral ~ 2013  . CORONARY ANGIOPLASTY WITH STENT PLACEMENT  2002  . CORONARY ARTERY BYPASS GRAFT  1995 and 2003  . IR RADIOLOGIST EVAL & MGMT  07/03/2016  . LEFT AND RIGHT HEART CATHETERIZATION WITH CORONARY ANGIOGRAM N/A 11/24/2013   Procedure: LEFT AND RIGHT HEART CATHETERIZATION WITH CORONARY ANGIOGRAM;  Surgeon: Laverda Page, MD;  Location: [facility] CATH LAB;  Service: Cardiovascular;  Laterality: N/A;  . PERCUTANEOUS NEEDLE BIOPSY OF RENAL LESION  ?2014  . TOTAL ABDOMINAL HYSTERECTOMY  1990's   "both ovaries were full of little tiny sores"  . TYMPANOPLASTY Bilateral    "had holes in them; still have holes in them"     OB History   No obstetric history on file.      Home Medications    Prior to Admission medications   Medication Sig Start Date End Date Taking? Authorizing Provider  BIDIL 20-37.5 MG tablet TAKE 2 TABLETS BY MOUTH 3 TIMES A DAY Patient taking differently: Take 2 tablets by mouth 3 (three) times daily.  06/16/18  Yes [name], MD  Cholecalciferol (VITAMIN D3) 2000 UNITS TABS Take 2,000 Units by mouth daily.    Yes [provider]  ELIQUIS 2.5 MG TABS tablet TAKE 1 TABLET BY MOUTH 2 TIMES DAILY. Patient taking differently: Take 2.5 mg by mouth 2 (two) times daily.  09/08/18  Yes Chambliss, Jeb Levering, MD  furosemide (LASIX) 40 MG tablet TAKE 1 TABLET BY MOUTH DAILY Patient taking differently: 40 mg daily.  07/28/18  Yes [name], MD  metolazone (ZAROXOLYN) 5 MG tablet Take 1 tablet (5 mg total) by mouth daily. Take 1 tablet daily for 5 days as needed for fluid retention 10/04/18 01/02/19 Yes [name], MD  metoprolol tartrate (LOPRESSOR) 25 MG tablet Take 0.5 tablets (12.5 mg total) by  mouth 2 (two) times daily. 08/28/18  Yes Lurline Del, DO  Multiple Vitamin (MULTIVITAMIN WITH MINERALS) TABS tablet Take 1 tablet by mouth daily.   Yes [provider]  Multiple Vitamins-Minerals (PRESERVISION AREDS) CAPS Take by mouth 2 (two) times a day.   Yes [provider]  PROAIR HFA 108 (90 Base) MCG/ACT inhaler INHALE 1 PUFF INTO THE LUNGS EVERY 6 HOURS AS NEEDED FOR WHEEZING OR SHORTNESS OF BREATH. Patient taking differently: Inhale 1 puff into the lungs every 6 (six) hours as needed for wheezing or shortness of breath.  08/04/18  Yes Chambliss, Jeb Levering, MD  rosuvastatin (CRESTOR) 20 MG tablet TAKE 1 TABLET BY MOUTH DAILY Patient taking differently: Take 20 mg by mouth daily.  04/22/18  Yes [name], MD  spironolactone (ALDACTONE) 25 MG tablet Take 0.5 tablets (12.5 mg total) by mouth daily. 09/16/18  Yes Miquel Dunn, NP  Turmeric 500 MG CAPS Take 500  mg by mouth daily. 1200   Yes [provider]  vitamin B-12 (CYANOCOBALAMIN) 1000 MCG tablet Take 1,000 mcg by mouth daily.   Yes [provider]  zolpidem (AMBIEN) 10 MG tablet Take 0.5-1 tablets (5-10 mg total) by mouth at bedtime as needed for sleep. Patient taking differently: Take 5-10 mg by mouth at bedtime.  09/03/18 12/02/18 Yes [name], MD    Family History Family History  Problem Relation Age of Onset  . Coronary artery disease Mother   . Stroke Mother   . Leukemia Father   . Leukemia Sister   . Cancer Brother   . Coronary artery disease Brother   . Heart attack Brother   . Heart attack Son   . Heart attack Daughter   . Myelodysplastic syndrome Sister     Social History Social History   Tobacco Use  . Smoking status: Never Smoker  . Smokeless tobacco: Never Used  Substance Use Topics  . Alcohol use: No  . Drug use: No     Allergies   Sulfamethoxazole-trimethoprim and Tape   Review of Systems Review of Systems  Constitutional:  Negative for chills and fever.  HENT: Negative for congestion.   Eyes: Negative for visual disturbance.  Respiratory: Positive for shortness of breath. Negative for cough.   Cardiovascular: Positive for leg swelling. Negative for chest pain.  Gastrointestinal: Negative for vomiting.  Genitourinary: Negative for frequency.  Musculoskeletal: Negative for myalgias.  Skin: Negative for rash.  Neurological: Negative for headaches.     Physical Exam Updated Vital Signs BP 123/62 (BP Location: Left Arm)   Pulse 62   Temp 98 F (36.7 C) (Oral)   Resp 18   SpO2 95%   Physical Exam Vitals signs and nursing note reviewed.  Constitutional:      Appearance: She is not ill-appearing.  HENT:     Head: Normocephalic and atraumatic.  Eyes:     Conjunctiva/sclera: Conjunctivae normal.  Neck:     Musculoskeletal: Neck supple.  Cardiovascular:     Rate and Rhythm: Normal rate and regular rhythm.     Pulses: Normal pulses.  Pulmonary:     Effort: Pulmonary effort is normal.     Breath sounds: Normal breath sounds. No wheezing, rhonchi or rales.  Abdominal:     Palpations: Abdomen is soft.     Tenderness: There is no abdominal tenderness. There is no guarding or rebound.  Musculoskeletal:     Right lower leg: Edema present.     Left lower leg: Edema present.     Comments: 2+ pitting edema bilaterally.   No C, T, L midline spinal tenderness.  Patient does have tenderness to coccyx.  No overlying skin changes including ecchymosis.  Strength equal to bilateral lower extremities.  Range of motion intact.  No tenderness to hips, knees, ankles.  2+ distal pulses.  Skin:    General: Skin is warm and dry.  Neurological:     Mental Status: She is alert.      ED Treatments / Results  Labs (all labs ordered are listed, but only abnormal results are displayed) Labs Reviewed  CBC WITH DIFFERENTIAL/PLATELET - Abnormal; Notable for the following components:      Result Value   RBC 3.65 (*)     Hemoglobin 10.7 (*)    HCT 33.4 (*)    All other components within normal limits  D-DIMER, QUANTITATIVE (NOT AT [facility]) - Abnormal; Notable for the following components:   D-Dimer, Quant  2.06 (*)    All other components within normal limits  BASIC METABOLIC PANEL - Abnormal; Notable for the following components:   Glucose, Bld 131 (*)    BUN 50 (*)    Creatinine, Ser 2.16 (*)    GFR calc non Af Amer 21 (*)    GFR calc Af Amer 24 (*)    All other components within normal limits  BRAIN NATRIURETIC PEPTIDE - Abnormal; Notable for the following components:   B Natriuretic Peptide 1,241.4 (*)    All other components within normal limits  MAGNESIUM - Abnormal; Notable for the following components:   Magnesium 2.5 (*)    All other components within normal limits  TROPONIN I (HIGH SENSITIVITY) - Abnormal; Notable for the following components:   Troponin I (High Sensitivity) 46 (*)    All other components within normal limits  TROPONIN I (HIGH SENSITIVITY) - Abnormal; Notable for the following components:   Troponin I (High Sensitivity) 48 (*)    All other components within normal limits  SARS CORONAVIRUS 2 (TAT 6-24 HRS)  URINALYSIS, ROUTINE W REFLEX MICROSCOPIC    EKG EKG Interpretation  Date/Time:  Tuesday October 07 2018 10:08:38 EDT Ventricular Rate:  69 PR Interval:    QRS Duration: 124 QT Interval:  434 QTC Calculation: 462 R Axis:   30 Text Interpretation:  Atrial fibrillation Nonspecific intraventricular conduction delay Inferior infarct, age indeterminate Consider anterolateral infarct \ when compared to prior, similar afib. more wandering baseline in V4. less PVC.  No STEMI Confirmed by Antony Blackbird [phone]) on 10/07/2018 10:14:01 AM   Radiology Dg Sacrum/coccyx  Result Date: 10/07/2018 CLINICAL DATA:  Recent fall with sacral pain, initial encounter EXAM: SACRUM AND COCCYX - 2+ VIEW COMPARISON:  08/17/2016 FINDINGS: Pelvic ring is intact. The sacral ala are  unremarkable. No acute fracture is noted. No soft tissue changes are seen. Diffuse vascular calcifications are noted. IMPRESSION: No acute abnormality noted. Electronically Signed   By: Inez Catalina M.D.   On: 10/07/2018 10:44   Dg Chest Port 1 View  Result Date: 10/07/2018 CLINICAL DATA:  Acute on chronic shortness of breath EXAM: PORTABLE CHEST 1 VIEW COMPARISON:  08/25/2018 FINDINGS: Cardiac shadow remains enlarged. Postsurgical changes are again seen and stable. The lungs are well aerated bilaterally. Small right pleural effusion is noted. Mild stable vascular congestion is noted. IMPRESSION: New small right pleural effusion. Electronically Signed   By: Inez Catalina M.D.   On: 10/07/2018 10:44    Procedures Procedures (including critical care time)  Medications Ordered in ED Medications  furosemide (LASIX) injection 40 mg (has no administration in time range)  acetaminophen (TYLENOL) tablet 650 mg (650 mg Oral Given 10/07/18 1138)  technetium albumin aggregated (MAA) injection solution 1.5 millicurie (1.5 millicuries Intravenous Contrast Given 10/07/18 1450)     Initial Impression / Assessment and Plan / ED Course  I have reviewed the triage vital signs and the nursing notes.  Pertinent labs & imaging results that were available during my care of the patient were reviewed by me and considered in my medical decision making (see chart for details).    85 year old female who presents to the ED complaining of worsening shortness of breath for the past week.  Patient does have a history of congestive heart failure.  There was concern that patient was not taking her medications as prescribed in [place].  Fortunately patient has been taking her Eliquis as prescribed.  Able to speak in full sentences without tachypnea.  She  is satting 95% on room air.  Patient does have 2+ bilateral pitting edema.  Will obtain screening labs today CBC, BMP, BNP, troponin, d-dimer, EKG, chest x-ray.   EKG with A.  fib.  Patient has history of same.  Troponin of 46 and repeat 48; this appears to be around pt's baseline.  D-dimer elevated at 2.06.  Unable to age correct.  Initially had ordered CTA but patient has an elevated creatinine of 2.16 and a GFR of 21.  Changed to VQ scan.  BNP elevated at 1200.  X-ray does show new small pleural effusion.  40 Milligrams IV Lasix ordered.  Question whether she is having a PE versus congestive heart failure exacerbation.  Regardless feel patient would benefit from admission given failure with outpatient diuretic therapy and worsening kidney function.  Will wait for VQ scan.  X-ray of the sacrum without any fractures.    Negative for PE. Hospitalist consulted at this time for admission.   4:18 PM Discussed case with Dr. Evangeline Gula with hospitalist team who agrees to accept patient for admission.   4:48 PM Was made aware that pt is with family medicine. Will consult them for admission. Dr. Evangeline Gula agrees to do H&P but family med needs to admit. Will consult them at this time.         Final Clinical Impressions(s) / ED Diagnoses   Final diagnoses:  Creatinine elevation  Acute on chronic congestive heart failure, unspecified heart failure type [facility])    ED Discharge Orders    None       Eustaquio Maize, PA-C 10/07/18 1618    Eustaquio Maize, PA-C 10/07/18 1721    Tegeler, Gwenyth Allegra, MD 10/08/18 [phone]

## 2018-10-07 NOTE — ED Notes (Signed)
At rest 96%, ambulating 96-100%, mostly higher than at rest.

## 2018-10-07 NOTE — ED Notes (Signed)
Called [facility] to ask about scan. Will probably be 3 p or so before scan is done.

## 2018-10-07 NOTE — ED Notes (Signed)
Dtr [name] [phone]
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
## 2018-10-08 ENCOUNTER — Encounter (HOSPITAL_COMMUNITY): Payer: Self-pay | Admitting: General Practice

## 2018-10-08 DIAGNOSIS — I5041 Acute combined systolic (congestive) and diastolic (congestive) heart failure: Secondary | ICD-10-CM | POA: Diagnosis not present

## 2018-10-08 DIAGNOSIS — I4821 Permanent atrial fibrillation: Secondary | ICD-10-CM | POA: Diagnosis not present

## 2018-10-08 DIAGNOSIS — I129 Hypertensive chronic kidney disease with stage 1 through stage 4 chronic kidney disease, or unspecified chronic kidney disease: Secondary | ICD-10-CM

## 2018-10-08 DIAGNOSIS — N183 Chronic kidney disease, stage 3 (moderate): Secondary | ICD-10-CM | POA: Diagnosis not present

## 2018-10-08 LAB — BASIC METABOLIC PANEL
Anion gap: 13 (ref 5–15)
BUN: 49 mg/dL — ABNORMAL HIGH (ref 8–23)
CO2: 25 mmol/L (ref 22–32)
Calcium: 9.4 mg/dL (ref 8.9–10.3)
Chloride: 100 mmol/L (ref 98–111)
Creatinine, Ser: 1.94 mg/dL — ABNORMAL HIGH (ref 0.44–1.00)
GFR calc Af Amer: 27 mL/min — ABNORMAL LOW (ref >60)
GFR calc non Af Amer: 23 mL/min — ABNORMAL LOW (ref >60)
Glucose, Bld: 106 mg/dL — ABNORMAL HIGH (ref 70–99)
Potassium: 4 mmol/L (ref 3.5–5.1)
Sodium: 138 mmol/L (ref 135–145)

## 2018-10-08 LAB — T4, FREE: Free T4: 1.31 ng/dL — ABNORMAL HIGH (ref 0.61–1.12)

## 2018-10-08 LAB — TSH: TSH: 6.769 u[IU]/mL — ABNORMAL HIGH (ref 0.350–4.500)

## 2018-10-08 NOTE — Discharge Summary (Signed)
[facility] Discharge Summary  Patient name: Jodi [name] Medical record number: [id number] Date of birth: 02/10/1932 Age: 85 y.o. Gender: female Date of Admission: 10/07/2018  Date of Discharge: 10/11/18  Admitting Physician: Lady Deutscher, MD  Primary Care Provider: [name], MD Consultants: Cardiology, Dr. Einar Gip,   Indication for Hospitalization: Acute on chronic combined heart failure  Discharge Diagnoses/Problem List:  Acute on chronic combined systolic and diastolic congestive heart failure Hypokalemia Multiple Joint OA CKD Stage III AFIB Hypercholesteremia  HTN Insomnia    Disposition: Home   Discharge Condition: Stable   Discharge Exam:  GEN:     alert, sitting on the side of the bed   EYES:   pupils equal and reactive, EOM intact RESP:  clear to auscultation bilaterally, no increased work of breathing  CVS:   regular rate and rhythm, systolic murmur, distal pulses intact   ABD:  soft, non-tender; mildly distended, bowel sounds present  EXT:   normal ROM, lower extremity edema resolved NEURO:  normal without focal findings,  speech normal, alert and oriented   Skin:   warm and dry, no cyanosis Psych: Normal affect and thought content   Brief Hospital Course:  Jodi [name] s a 85 y.o. female with history of mixed heart failure, hyperlipidemia, hypertension, CAD, CABG x2, AFIB, aortic stenosis, MR, CKD, OA  presented to the ED with worsening dyspnea and lower extremity edema.  ED work-up revealed elevated d-dimer and follow-up VQ scan negative for PE. Chest x-ray showed a small pleural effusion.  Jodi [name] was admitted for acute exacerbation of chronic mixed heart failure secondary to multi valvular disease.  Initial BNP was 1241 and down trended to 943 prior to discharge. She was aggressively diuresed with IV Lasix and metolazone.  She was transitioned to oral therapy prior to discharge. Patient's cardiologist,  Dr. Einar Gip was consulted and titrated medication.   Patient was near dry weight upon discharge, 149 lbs.  Patient is medically stable and appropriate for discharge home with assistance from home health.  Patient has follow-up appointment scheduled with cardiologist on 10/22/18.   Issues for Follow Up:  1.Follow-up with cardiology and continue to assess fluid status and medication titration.  Patient metolazone increased from 5 mg to 10 mg daily, Lasix dose returned to home dose of 40 mg twice daily.  2. TSH and free T4 were elevated.  Recommend repeating TSH.  3.  Initiated 50 mg of trazodone to help with sleep. Pt's daughter, Jodi [name], requested patient be on Ativan instead of Ambien however neither is recommended in the elderly.  Recommend continued discussion regarding medication for insomnia. 4.  It was recommended, by physical therapy, the patient have a rolling walker with 5 wheels and bedside cammode. Please follow-up if patient obtained these devices.  5.  Patient on heart healthy diet with 1.5 L fluid restriction.  However patient has been eating higher salt foods and likely attributed to acute decompensation, per her cardiologist.  Recommended reinforcing diet with patient.   Significant Procedures: None  Significant Labs and Imaging:  Recent Labs  Lab 10/07/18 1057 10/07/18 1900  WBC 8.8 9.3  HGB 10.7* 11.8*  HCT 33.4* 37.0  PLT 192 208   Recent Labs  Lab 10/07/18 1057  10/08/18 0418 10/09/18 0539 10/10/18 0511 10/10/18 1535 10/11/18 0534  NA 138  --  138 135 136 135 134*  K 4.1  --  4.0 3.8 3.4* 4.1 4.1  CL 102  --  100 96* 93* 91* 91*  CO2 23  --  25 25 30 29 30   GLUCOSE 131*  --  106* 112* 165* 105* 137*  BUN 50*  --  49* 54* 58* 60* 66*  CREATININE 2.16*   < > 1.94* 1.95* 2.12* 2.18* 2.48*  CALCIUM 9.6  --  9.4 9.6 9.7 10.2 9.5  MG 2.5*  --   --   --   --   --   --    < > = values in this interval not displayed.   BNP: 1241.2 on admission 943 on 9/4  TSH: 6.769  elevated Free T4: 1.31 elevated Free T3: 3.2 WNL    Results/Tests Pending at Time of Discharge:   Discharge Medications:  Allergies as of 10/11/2018      Reactions   Sulfamethoxazole-trimethoprim Other (See Comments)   Caused shaking and chills   Tape Other (See Comments)   Tears skin.  Please use "paper" tape      Medication List    STOP taking these medications   PreserVision AREDS Caps   zolpidem 10 MG tablet Commonly known as: AMBIEN     TAKE these medications   BiDil 20-37.5 MG tablet Generic drug: isosorbide-hydrALAZINE TAKE 2 TABLETS BY MOUTH 3 TIMES A DAY   Eliquis 2.5 MG Tabs tablet Generic drug: apixaban TAKE 1 TABLET BY MOUTH 2 TIMES DAILY. What changed: how much to take   furosemide 40 MG tablet Commonly known as: LASIX Take 1 tablet (40 mg total) by mouth 2 (two) times daily. What changed: when to take this   metolazone 10 MG tablet Commonly known as: ZAROXOLYN Take 1 tablet (10 mg total) by mouth daily. What changed:   medication strength  how much to take  additional instructions   metoprolol tartrate 25 MG tablet Commonly known as: LOPRESSOR Take 0.5 tablets (12.5 mg total) by mouth 2 (two) times daily.   multivitamin with minerals Tabs tablet Take 1 tablet by mouth daily.   polyethylene glycol 17 g packet Commonly known as: MIRALAX / GLYCOLAX Take 17 g by mouth 2 (two) times daily.   ProAir HFA 108 (90 Base) MCG/ACT inhaler Generic drug: albuterol INHALE 1 PUFF INTO THE LUNGS EVERY 6 HOURS AS NEEDED FOR WHEEZING OR SHORTNESS OF BREATH. What changed: See the new instructions.   rosuvastatin 20 MG tablet Commonly known as: CRESTOR TAKE 1 TABLET BY MOUTH DAILY   spironolactone 25 MG tablet Commonly known as: ALDACTONE Take 0.5 tablets (12.5 mg total) by mouth daily.   traZODone 50 MG tablet Commonly known as: DESYREL Take 1 tablet (50 mg total) by mouth at bedtime.   Turmeric 500 MG Caps Take 500 mg by mouth daily. 1200    vitamin B-12 1000 MCG tablet Commonly known as: CYANOCOBALAMIN Take 1,000 mcg by mouth daily.   Vitamin D3 50 MCG (2000 UT) Tabs Take 2,000 Units by mouth daily.            Durable Medical Equipment  (From admission, onward)         Start     Ordered   10/08/18 1801  For home use only DME Bedside commode  Once    Question:  Patient needs a bedside commode to treat with the following condition  Answer:  CHF (congestive heart failure) ([place])   10/08/18 1800   10/08/18 1800  For home use only DME Walker rolling  Once    Question:  Patient needs a walker to treat with the  following condition  Answer:  CHF (congestive heart failure) ([place])   10/08/18 1800          Discharge Instructions: Please refer to Patient Instructions section of EMR for full details.  Patient was counseled important signs and symptoms that should prompt return to medical care, changes in medications, dietary instructions, activity restrictions, and follow up appointments.   Follow-Up Appointments: Follow-up Information    [facility] Follow up.   Specialty: Home Health Services Why: RN, PT, OT Contact information: [place] [address] [phone]        [facility] Follow up.   Why: rolling walker, 3 n 1        [name], MD. Go on 10/22/2018.   Specialty: Cardiology Why: Appointment 9/16 at 3:30 PM Contact information: [address] [place] [address] [phone]        [name], MD. Schedule an appointment as soon as possible for a visit in 1 week(s).   Specialty: Family Medicine Why: Please make an appointment with your PCP within 1 week.  Contact information: [place] [address] [phone]           Lyndee Hensen, MD 10/11/2018, 12:35 PM PGY-1, [place]
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
## 2018-10-08 NOTE — Progress Notes (Addendum)
Family Medicine Teaching Service Daily Progress Note Intern Pager: [phone]  Patient name: Jodi [name] Medical record number: [id number] Date of birth: 15-May-1932 Age: 85 y.o. Gender: female  Primary Care Provider: [name], MD Consultants: Dr. Einar Gip Cardiologist,   Code Status: DNR   Pt Overview and Major Events to Date:  Patient admitted 10/07/2018 for increasing shortness of breath and lower extremity edema, likely secondary to acute CHF versus worsening aortic stenosis  Assessment and Plan: Jodi [name] is a 85 y.o. female with history of heart failure, hyperlipidemia, hypertension, CAD, CABG x2, AFIB, aortic stenosis, MR, CKD, OA and is admitted for worsening shortness of breath and lower extremity edema.  Admitted for acute exacerbation of CHF.  Acute combined systolic and diastolic congestive heart failure with severe aortic stenosis Patient with mild bilateral rales on exam and 2+ pitting edema to the knee.  Has diuresed 2300 mL in the last 24 hours.  In review of cardiology notes, patient has increased her salt intake recently.  Patient on heart healthy diet with 1.5 L fluid restriction.  Continue with current treatment plan.  This is the patient's third admission for heart failure in the past 6 months suspect it is probably due to poor diet but possibly due to valvular heart disease.  Consider increasing metoprolol dose after heart failure stabilization.   ECHO on 08/26/2018 showed an EF of 35-40% with LV  diffuse hypokinesis and mitral valve degeneration, severely thickened aortic valve with moderate to severe stenosis.  Will diurese patient recommend cardiology consultation in a.m.  Patient follows with Dr. Einar Gip.  Daughter reports Lasix was decreased from 80 to 40 mg recently.  In chart review, the patient and and her daughters have been discussing her poor prognosis with Dr. Einar Gip.  We will consider palliative care consult prior to discharge. - Continue to  diurese patient  - Consulted cardiology, Dr. Einar Gip appreciate recommendations. -Increase Lasix from 40 mg to 80 mg twice daily  -Continue metolazone 5 mg daily -Spironolactone 12.5 mg daily - PT and OT: Patient could benefit after discharge  with home health PT and OT  -Daily BMP  Multiple Joint OA - Continue home medication management  - Tylenol every 4 hours as needed  CKD Stage III Creatinine 1.94 today, patient's baseline is 1.9 - Avoid nephrotoxic agents  -Vitamin D 2000 units -Daily BMP  pAFI B Continue home medications - Eliquis 2.5 mg twice daily - Metoprolol 12.5 mg twice daily -  Hypercholesteremia  Continue home medications  - Rosuvastatin 20 mg daily  HTN Patiently previously well controlled on home medications.  Continue home medications - Isosorbide hydralazine 20mg  -37.5 mg 3 times daily  Insomnia  Pharmacy investigation reveals patient has been refilling Ambien monthly.  However during the August admission patient did not require Ambien for sleep.  Will make 5 mg Ambien available for patient upon request - Discontinued 10 mg Ambien as it is not recommended in the elderly   FEN/GI: Peripheral IV PPx: home Eliquis   Disposition: Likely home pending medical clearance  Subjective:  Jodi [name] patient reports increasing shortness of breath and lower extremity edema for past 2 days.  She is having difficulty lying flat and waking up at night gasping for breath.  Breathing is better this morning. There were no significant events overnight.  Patient has no additional complaints this morning.  Denies pain.   Objective: Temp:  [97.2 F (36.2 C)-98.4 F (36.9 C)] 98.4 F (36.9 C) (09/02 0911)  Pulse Rate:  [58-85] 63 (09/02 0911) Resp:  [14-21] 20 (09/02 0911) BP: (105-160)/(60-100) 131/100 (09/02 0911) SpO2:  [92 %-97 %] 96 % (09/02 0911) Weight:  [71.9 kg-72.4 kg] 71.9 kg (09/02 0323)  Physical Exam:  GEN: pleasant elderly female, in no acute  distress  CV: regular rate and rhythm, murmur appreciated  RESP: no increased work of breathing, mild rales bilaterally  ABD: Bowel sounds present. Soft, Nontender, mildly distended, no rigidity MSK: 3+ pitting edema to the knees or cyanosis noted SKIN: warm, dry, linear incision healed from CABG graft NEURO: Alert and oriented, grossly normal, moves all extremities appropriately  PSYCH: Normal affect and thought content    Laboratory: Recent Labs  Lab 10/07/18 1057 10/07/18 1900  WBC 8.8 9.3  HGB 10.7* 11.8*  HCT 33.4* 37.0  PLT 192 208   Recent Labs  Lab 10/07/18 1057 10/07/18 1900 10/08/18 0418  NA 138  --  138  K 4.1  --  4.0  CL 102  --  100  CO2 23  --  25  BUN 50*  --  49*  CREATININE 2.16* 2.02* 1.94*  CALCIUM 9.6  --  9.4  GLUCOSE 131*  --  106*     Imaging/Diagnostic Tests: Dg Sacrum/coccyx  Result Date: 10/07/2018 CLINICAL DATA:  Recent fall with sacral pain, initial encounter EXAM: SACRUM AND COCCYX - 2+ VIEW COMPARISON:  08/17/2016 FINDINGS: Pelvic ring is intact. The sacral ala are unremarkable. No acute fracture is noted. No soft tissue changes are seen. Diffuse vascular calcifications are noted. IMPRESSION: No acute abnormality noted. Electronically Signed   By: Inez Catalina M.D.   On: 10/07/2018 10:44   Nm Pulmonary Vent And Perf (v/q Scan)  Result Date: 10/07/2018 CLINICAL DATA:  Worsening shortness of breath over the past day. EXAM: NUCLEAR MEDICINE VENTILATION - PERFUSION LUNG SCAN TECHNIQUE: Ventilation images were obtained in multiple projections using inhaled aerosol Tc-70m DTPA. Perfusion images were obtained in multiple projections after intravenous injection of Tc-44m MAA. RADIOPHARMACEUTICALS:  30.6 mCi of Tc-46m DTPA aerosol inhalation and 1.5 mCi Tc59m MAA IV COMPARISON:  Single-view of the chest earlier today. FINDINGS: Ventilation: No focal ventilation defect. Perfusion: No wedge shaped peripheral perfusion defects to suggest acute pulmonary  embolism. IMPRESSION: Negative for pulmonary embolus. Electronically Signed   By: Inge Rise M.D.   On: 10/07/2018 16:03   Dg Chest Port 1 View  Result Date: 10/07/2018 CLINICAL DATA:  Acute on chronic shortness of breath EXAM: PORTABLE CHEST 1 VIEW COMPARISON:  08/25/2018 FINDINGS: Cardiac shadow remains enlarged. Postsurgical changes are again seen and stable. The lungs are well aerated bilaterally. Small right pleural effusion is noted. Mild stable vascular congestion is noted. IMPRESSION: New small right pleural effusion. Electronically Signed   By: Inez Catalina M.D.   On: 10/07/2018 10:44    Lyndee Hensen, MD 10/08/2018, 9:50 AM PGY-1, [place] Intern pager: [phone], text pages welcome

## 2018-10-08 NOTE — Evaluation (Signed)
Physical Therapy Evaluation Patient Details Name: Jodi [name] MRN: [id number] DOB: 1932-02-09 Today's Date: 10/08/2018   History of Present Illness  Pt is a 85 y/o with PMH of CHF, HTN, CAD, coronary artery bypass graft x2, atrial fibrillation, aortic stenosis, mitral regurgitation, CKD, MI, CVA, macular degeneration L eye, osteoarthritis and bronchitis who presents emergency department today complaining of worsening shortness of breath over the past 2 days and lower extremity edema. Admitted for acute exacerbation of congestive heart failure. Chest xray reveals new small R pleural effusion.   Clinical Impression  Pt was seen for mobility and strength evaluation and was able to climb a short set of stairs to validate going home.  Even so, she is in pain from sitting on the floor and will require someone to be available to help her walk and manage steps.  Follow acutely to work on her abilities to control balance, to increase endurance and to reduce her fall risk along with increasing self efficacy.  Pt had a daughter in to see her who was not a caregiver, and did not stay long enough to get any information from her.    Follow Up Recommendations Home health PT;Supervision for mobility/OOB    Equipment Recommendations  Rolling walker with 5" wheels    Recommendations for Other Services       Precautions / Restrictions Precautions Precautions: Fall Precaution Comments: SOB with exertion Restrictions Weight Bearing Restrictions: No      Mobility  Bed Mobility Overal bed mobility: Needs Assistance Bed Mobility: Supine to Sit;Sit to Supine     Supine to sit: Min assist Sit to supine: Min guard   General bed mobility comments: able to sit up with support to R side of trunk and back with railing  Transfers Overall transfer level: Needs assistance Equipment used: Rolling walker (2 wheeled) Transfers: Sit to/from Stand Sit to Stand: Min assist         General transfer  comment: min assist to support standing effort  Ambulation/Gait Ambulation/Gait assistance: Min guard Gait Distance (Feet): 180 Feet(30+30+30+90) Assistive device: Rolling walker (2 wheeled) Gait Pattern/deviations: Step-through pattern;Decreased stride length;Wide base of support Gait velocity: reduced Gait velocity interpretation: <1.31 ft/sec, indicative of household ambulator General Gait Details: pt is controlling turns on the walker  Stairs Stairs: Yes Stairs assistance: Min assist Stair Management: One rail Right;Forwards;Step to pattern Number of Stairs: 5 General stair comments: pt is at home with 5 stairs per her report  Wheelchair Mobility    Modified Rankin (Stroke Patients Only)       Balance Overall balance assessment: Needs assistance;History of Falls Sitting-balance support: Feet supported Sitting balance-Leahy Scale: Good     Standing balance support: Bilateral upper extremity supported Standing balance-Leahy Scale: Fair Standing balance comment: requires RW for dynamic support or HHA                             Pertinent Vitals/Pain Pain Assessment: Faces Faces Pain Scale: Hurts even more Pain Location: tailbone Pain Descriptors / Indicators: Grimacing Pain Intervention(s): Monitored during session;Repositioned(pt was hurting only on the stairs)    Home Living Family/patient expects to be discharged to:: Private residence Living Arrangements: Children Available Help at Discharge: Family;Available 24 hours/day Type of Home: House Home Access: Stairs to enter Entrance Stairs-Rails: Right Entrance Stairs-Number of Steps: 5 Home Layout: One level Home Equipment: Walker - 4 wheels;Cane - single point Additional Comments: patient reports she has been staying at  her daughters and plans to dc back there after hospital stay--setup is above    Prior Function Level of Independence: Independent with assistive device(s)         Comments:  was on a rollator most recently     Hand Dominance   Dominant Hand: Right    Extremity/Trunk Assessment   Upper Extremity Assessment Upper Extremity Assessment: Generalized weakness    Lower Extremity Assessment Lower Extremity Assessment: Generalized weakness    Cervical / Trunk Assessment Cervical / Trunk Assessment: Kyphotic  Communication   Communication: HOH  Cognition Arousal/Alertness: Awake/alert Behavior During Therapy: WFL for tasks assessed/performed Overall Cognitive Status: Within Functional Limits for tasks assessed                                 General Comments: appears [facility]       General Comments General comments (skin integrity, edema, etc.): HR was unchanged with effort, 73 after effort    Exercises     Assessment/Plan    PT Assessment Patient needs continued PT services  PT Problem List Decreased strength;Decreased range of motion;Decreased activity tolerance;Decreased balance;Decreased mobility;Decreased coordination;Decreased knowledge of use of DME;Decreased safety awareness;Cardiopulmonary status limiting activity;Pain       PT Treatment Interventions DME instruction;Gait training;Stair training;Functional mobility training;Therapeutic activities;Therapeutic exercise;Balance training;Neuromuscular re-education;Patient/family education    PT Goals (Current goals can be found in the Care Plan section)  Acute Rehab PT Goals Patient Stated Goal: to get to her own home eventually PT Goal Formulation: With patient Time For Goal Achievement: 10/22/18 Potential to Achieve Goals: Good    Frequency Min 3X/week   Barriers to discharge Inaccessible home environment stairs with tailbone pain at home    Co-evaluation               AM-PAC PT "6 Clicks" Mobility  Outcome Measure Help needed turning from your back to your side while in a flat bed without using bedrails?: None Help needed moving from lying on your back to sitting  on the side of a flat bed without using bedrails?: A Little Help needed moving to and from a bed to a chair (including a wheelchair)?: A Little Help needed standing up from a chair using your arms (e.g., wheelchair or bedside chair)?: A Little Help needed to walk in hospital room?: A Little Help needed climbing 3-5 steps with a railing? : A Little 6 Click Score: 19    End of Session Equipment Utilized During Treatment: Gait belt Activity Tolerance: Patient limited by fatigue;Treatment limited secondary to medical complications (Comment)(SOB with stairs) Patient left: in bed;with call bell/phone within reach;with bed alarm set Nurse Communication: Mobility status PT Visit Diagnosis: Unsteadiness on feet (R26.81);Muscle weakness (generalized) (M62.81);History of falling (Z91.81)    Time: 7902-4097 PT Time Calculation (min) (ACUTE ONLY): 20 min   Charges:   PT Evaluation $PT Eval Moderate Complexity: 1 Mod         Ramond Dial 10/08/2018, 11:48 AM   Mee Hives, PT MS Acute Rehab Dept. Number: [place] and [place]

## 2018-10-08 NOTE — Progress Notes (Addendum)
Nutrition Education Note  RD working remotely.  RD consulted for nutrition education regarding CHF.  Spoke with pt over the phone. Pt politely declined interview and education at this time. She reports she cannot hear very well and she is waiting on her son to bring her batteries for her hearing aids (which are currently on low battery).   RD provided "Low Sodium Nutrition Therapy" and "Sodium Free Seasoning Tips" handouts from the Academy of [employer] Dietetics; attached to AVS/ discharge summary.   Body mass index is 28.08 kg/m. Pt meets criteria for overweight based on current BMI.  Current diet order is heart healthy with 1.5 L fluid restriction, patient is consuming approximately 75-80% of meals at this time. Labs and medications reviewed. No further nutrition interventions warranted at this time. RD contact information provided. If additional nutrition issues arise, please re-consult RD.   Jodi [name], RD, LDN, [place] Registered Dietitian II Certified Diabetes Care and Education Specialist Pager: [phone] After hours Pager: [phone]

## 2018-10-08 NOTE — Evaluation (Addendum)
Occupational Therapy Evaluation Patient Details Name: Jodi [name] MRN: [id number] DOB: 1932/02/23 Today's Date: 10/08/2018    History of Present Illness Pt is a 85 y/o with PMH of CHF, HTN, CAD, coronary artery bypass graft x2, atrial fibrillation, aortic stenosis, mitral regurgitation, CKD, MI, CVA, macular degeneration L eye, osteoarthritis and bronchitis who presents emergency department today complaining of worsening shortness of breath over the past 2 days and lower extremity edema. Admitted for acute exacerbation of congestive heart failure. Chest xray reveals new small R pleural effusion.    Clinical Impression   PTA patient reports independent with ADLs, beginning to use RW for mobility as needed.  Admitted for above and limited by problem list below, including impaired activity tolerance, generalized weakness.  She currently requires min guard for mobility and transfers in room without AD (but is reaching out for support), min guard for LB ADLs and supervision for static standing toileting.  VSS on RA. She reports staying with her daughter PTA, and plans to return there initially after dc.  She will benefit from continued OT services while admitted and after dc at [facility] level in order to maximize independence and safety with ADLs, mobility.     Follow Up Recommendations  Home health OT;Supervision/Assistance - 24 hour(inital 24/7)    Equipment Recommendations  3 in 1 bedside commode    Recommendations for Other Services       Precautions / Restrictions Precautions Precautions: Fall Restrictions Weight Bearing Restrictions: No      Mobility Bed Mobility Overal bed mobility: Needs Assistance Bed Mobility: Sit to Supine       Sit to supine: Min guard   General bed mobility comments: increased time and effort, min guard for safety to return supine (was in restroom upon entring room)  Transfers Overall transfer level: Needs assistance Equipment used: None Transfers:  Sit to/from Stand Sit to Stand: Min guard         General transfer comment: min guard for safety and balance     Balance Overall balance assessment: Needs assistance;History of Falls Sitting-balance support: No upper extremity supported;Feet supported Sitting balance-Leahy Scale: Good     Standing balance support: No upper extremity supported;During functional activity Standing balance-Leahy Scale: Fair Standing balance comment: patient reaching out for UE support dynamically                           ADL either performed or assessed with clinical judgement   ADL Overall ADL's : Needs assistance/impaired     Grooming: Min guard;Standing   Upper Body Bathing: Supervision/ safety;Set up;Sitting   Lower Body Bathing: Min guard;Sit to/from stand   Upper Body Dressing : Set up;Supervision/safety;Sitting   Lower Body Dressing: Min guard;Sit to/from stand Lower Body Dressing Details (indicate cue type and reason): able to don/doff socks with ease, min guard sit to stand  Toilet Transfer: Min guard;Ambulation   Toileting- Clothing Manipulation and Hygiene: Sit to/from stand;Supervision/safety       Functional mobility during ADLs: Min guard General ADL Comments: patient limited by decreased activity tolerance and endurance      Vision         Perception     Praxis      Pertinent Vitals/Pain Pain Assessment: Faces Faces Pain Scale: Hurts little more Pain Location: buttocks Pain Descriptors / Indicators: Discomfort;Grimacing;Guarding Pain Intervention(s): Monitored during session;Repositioned     Hand Dominance Right   Extremity/Trunk Assessment Upper Extremity Assessment Upper Extremity Assessment:  Generalized weakness   Lower Extremity Assessment Lower Extremity Assessment: Defer to PT evaluation       Communication Communication Communication: No difficulties   Cognition Arousal/Alertness: Awake/alert Behavior During Therapy: WFL for  tasks assessed/performed Overall Cognitive Status: Within Functional Limits for tasks assessed                                 General Comments: appears WFL    General Comments  VSS, on RA with oxgyen saturations maintained; noted B LE edema     Exercises     Shoulder Instructions      Home Living Family/patient expects to be discharged to:: Private residence Living Arrangements: Children(daughter) Available Help at Discharge: Family;Available 24 hours/day Type of Home: House Home Access: Stairs to enter [employer] of Steps: 2   Home Layout: One level     Bathroom Shower/Tub: Occupational psychologist: Standard     Home Equipment: Cane - single point;Grab bars - tub/shower   Additional Comments: patient reports she has been staying at her daughters and plans to dc back there after hospital stay--setup is above      Prior Functioning/Environment Level of Independence: Independent with assistive device(s)        Comments: reports recently using RW for mobility (but "just starting to use"), independnet ADLs         OT Problem List: Decreased activity tolerance;Impaired balance (sitting and/or standing);Decreased knowledge of use of DME or AE;Decreased knowledge of precautions;Cardiopulmonary status limiting activity;Increased edema;Pain      OT Treatment/Interventions: Self-care/ADL training;DME and/or AE instruction;Therapeutic activities;Patient/family education;Balance training    OT Goals(Current goals can be found in the care plan section) Acute Rehab OT Goals Patient Stated Goal: to move back to my apartment  OT Goal Formulation: With patient Time For Goal Achievement: 10/22/18 Potential to Achieve Goals: Good  OT Frequency: Min 2X/week   Barriers to D/C:            Co-evaluation              AM-PAC OT "6 Clicks" Daily Activity     Outcome Measure Help from another person eating meals?: None Help from another  person taking care of personal grooming?: A Little Help from another person toileting, which includes using toliet, bedpan, or urinal?: A Little Help from another person bathing (including washing, rinsing, drying)?: A Little Help from another person to put on and taking off regular upper body clothing?: None Help from another person to put on and taking off regular lower body clothing?: A Little 6 Click Score: 20   End of Session Nurse Communication: Mobility status  Activity Tolerance: Patient tolerated treatment well Patient left: in bed;with call bell/phone within reach;with bed alarm set  OT Visit Diagnosis: Other abnormalities of gait and mobility (R26.89);Pain;History of falling (Z91.81) Pain - part of body: (buttocks)                Time: 2683-4196 OT Time Calculation (min): 17 min Charges:  OT General Charges $OT Visit: 1 Visit OT Evaluation $OT Eval Low Complexity: 1 [place], OT Acute Rehabilitation Services Pager [phone] Office [phone]   Delight Stare 10/08/2018, 10:16 AM

## 2018-10-08 NOTE — Discharge Instructions (Addendum)
You were hospitalized at [facility] due to congestive heart failure.  We expect this is from worsening heart valve disease and increased dietary salt intake which improved after diuretic medication adjustment.   We stopped your Ambien as it is not recommended.  We started you on trazodone 50 mg to help you sleep.  Dr. Einar Gip increased the dosage of metolazone to 10 mg daily.  You were to also take Lasix 40 mg twice daily.  Please continue to follow-up with him.  For constipation we have added MiraLAX.  Please use daily for constipation.   We are so glad you are feeling beginning to feel better.  Be sure to follow-up with your cardiologist.  Please also be sure to follow-up with primary care physician at your earliest convenience.  Thank you for allowing [place] to take care of you.  Take care, [facility] family medicine team     Low Sodium Nutrition Therapy  Eating less sodium can help you if you have high blood pressure, heart failure, or kidney or liver disease.   Your body needs a little sodium, but too much sodium can cause your body to hold onto extra water. This extra water will raise your blood pressure and can cause damage to your heart, kidneys, or liver as they are forced to work harder.   Sometimes you can see how the extra fluid affects you because your hands, legs, or belly swell. You may also hold water around your heart and lungs, which makes it hard to breathe.   Even if you take medication for blood pressure or a water pill (diuretic) to remove fluid, it is still important to have less salt in your diet.   Check with your primary care provider before drinking alcohol since it may affect the amount of fluid in your body and how your heart, kidneys, or liver work. Sodium in Food A low-sodium meal plan limits the sodium that you get from food and beverages to 1,500-2,000 milligrams (mg) per day. Salt is the main source of sodium. Read the nutrition label on the package to find out  how much sodium is in one serving of a food.   Select foods with 140 milligrams (mg) of sodium or less per serving.   You may be able to eat one or two servings of foods with a little more than 140 milligrams (mg) of sodium if you are closely watching how much sodium you eat in a day.   Check the serving size on the label. The amount of sodium listed on the label shows the amount in one serving of the food. So, if you eat more than one serving, you will get more sodium than the amount listed.  Tips Cutting Back on Sodium  Eat more fresh foods.   Fresh fruits and vegetables are low in sodium, as well as frozen vegetables and fruits that have no added juices or sauces.   Fresh meats are lower in sodium than processed meats, such as bacon, sausage, and hotdogs.   Not all processed foods are unhealthy, but some processed foods may have too much sodium.   Eat less salt at the table and when cooking. One of the ingredients in salt is sodium.   One teaspoon of table salt has 2,300 milligrams of sodium.   Leave the salt out of recipes for pasta, casseroles, and soups.  Be a Paramedic.   Food packages that say Salt-free, sodium-free, very low sodium, and low sodium have less  than 140 milligrams of sodium per serving.   Beware of products identified as Unsalted, No Salt Added, Reduced Sodium, or Lower Sodium. These items may still be high in sodium. You should always check the nutrition label.  Add flavors to your food without adding sodium.   Try lemon juice, lime juice, or vinegar.   Dry or fresh herbs add flavor.   Buy a sodium-free seasoning blend or make your own at home.  You can purchase salt-free or sodium-free condiments like barbeque sauce in stores and online. Ask your registered dietitian nutritionist for recommendations and where to find them.    Eating in Restaurants  Choose foods carefully when you eat outside your home. Restaurant foods can be very  high in sodium. Many restaurants provide nutrition facts on their menus or their websites. If you cannot find that information, ask your server. Let your server know that you want your food to be cooked without salt and that you would like your salad dressing and sauces to be served on the side.      Foods Recommended  Food Group  Foods Recommended   Grains  Bread, bagels, rolls without salted tops Homemade bread made with reduced-sodium baking powder Cold cereals, especially shredded wheat and puffed rice Oats, grits, or cream of wheat Pastas, quinoa, and rice Popcorn, pretzels or crackers without salt Corn tortillas   Protein Foods  Fresh meats and fish; [place] bacon (check the nutrition labels - make sure they are not packaged in a sodium solution) Canned or packed tuna (no more than 4 ounces at 1 serving) Beans and peas Soybeans) and tofu Eggs Nuts or nut butters without salt   Dairy  Milk or milk powder Plant milks, such as rice and soy Yogurt, including Greek yogurt Small amounts of natural cheese (blocks of cheese) or reduced-sodium cheese can be used in moderation. (Swiss, ricotta, and fresh mozzarella cheese are lower in sodium than the others) Cream Cheese Low sodium cottage cheese   Vegetables  Fresh and frozen vegetables without added sauces or salt Homemade soups (without salt) Low-sodium, salt-free or sodium-free canned vegetables and soups   Fruit  Fresh and canned fruits Dried fruits, such as raisins, cranberries, and prunes   Oils  Tub or liquid margarine, regular or without salt Canola, corn, peanut, olive, safflower, or sunflower oils   Condiments  Fresh or dried herbs such as basil, bay leaf, dill, mustard (dry), nutmeg, paprika, parsley, rosemary, sage, or thyme.  Low sodium ketchup Vinegar  Lemon or lime juice Pepper, red pepper flakes, and cayenne. Hot sauce contains sodium, but if you use just a drop or two, it will not add up to much.   Salt-free or sodium-free seasoning mixes and marinades Simple salad dressings: vinegar and oil     Foods Not Recommended  Food Group  Foods Not Recommended   Grains  Breads or crackers topped with salt Cereals (hot/cold) with more than 300 mg sodium per serving Biscuits, cornbread, and other quick breads prepared with baking soda Pre-packaged bread crumbs Seasoned and packaged rice and pasta mixes Self-rising flours   Protein Foods  Cured meats: Bacon, ham, sausage, pepperoni and hot dogs Canned meats (chili, vienna sausage, or sardines) Smoked fish and meats Frozen meals that have more than 600 mg of sodium per serving Egg substitute (with added sodium)   Dairy  Buttermilk Processed cheese spreads Cottage cheese (1 cup may have over 500 mg of sodium; look for low-sodium.) American or feta cheese Shredded  Cheese has more sodium than blocks of cheese String cheese   Vegetables  Canned vegetables (unless they are salt-free, sodium-free or low sodium) Frozen vegetables with seasoning and sauces Sauerkraut and pickled vegetables Canned or dried soups (unless they are salt-free, sodium-free, or low sodium) [place] fries and onion rings   Fruit  Dried fruits preserved with additives that have sodium   Oils  Salted butter or margarine, all types of olives   Condiments  Salt, sea salt, kosher salt, onion salt, and garlic salt Seasoning mixes with salt Bouillon cubes Ketchup Barbeque sauce and Worcestershire sauce unless low sodium Soy sauce Salsa, pickles, olives, relish Salad dressings: ranch, blue cheese, [place], and [place].     Low Sodium Sample 1-Day Menu   Breakfast  1 cup cooked oatmeal   1 slice whole wheat bread toast   1 tablespoon peanut butter without salt   1 banana   1 cup 1% milk   Lunch  Tacos made with: 2 corn tortillas    cup black beans, low sodium    cup roasted or grilled chicken (without skin)    avocado   Squeeze of  lime juice   1 cup salad greens   1 tablespoon low-sodium salad dressing    cup strawberries   1 orange   Afternoon Snack  1/3 cup grapes   6 ounces yogurt   Evening Meal  3 ounces herb-baked fish   1 baked potato   2 teaspoons olive oil    cup cooked carrots   2 thick slices tomatoes on:   2 lettuce leaves   1 teaspoon olive oil   1 teaspoon balsamic vinegar   1 cup 1% milk   Evening Snack  1 apple    cup almonds without salt     Low-Sodium Vegetarian (Lacto-Ovo) Sample 1-Day Menu   Breakfast  1 cup cooked oatmeal   1 slice whole wheat toast   1 tablespoon peanut butter without salt   1 banana   1 cup 1% milk   Lunch  Tacos made with: 2 corn tortillas    cup black beans, low sodium    cup roasted or grilled chicken (without skin)    avocado   Squeeze of lime juice   1 cup salad greens   1 tablespoon low-sodium salad dressing    cup strawberries   1 orange   Evening Meal  Stir fry made with:  cup tofu   1 cup brown rice    cup broccoli    cup green beans    cup peppers    tablespoon peanut oil   1 orange   1 cup 1% milk   Evening Snack  4 strips celery   2 tablespoons hummus   1 hard-boiled egg     Low-Sodium Vegan Sample 1-Day Menu   Breakfast  1 cup cooked oatmeal   1 tablespoon peanut butter without salt   1 cup blueberries   1 cup soymilk fortified with calcium, vitamin B12, and vitamin D   Lunch  1 small whole wheat pita    cup cooked lentils   2 tablespoons hummus   4 carrot sticks   1 medium apple   1 cup soymilk fortified with calcium, vitamin B12, and vitamin D   Evening Meal  Stir fry made with:  cup tofu   1 cup brown rice    cup broccoli    cup green beans    cup peppers    tablespoon  peanut oil   1 cup cantaloupe   Evening Snack  1 cup soy yogurt    cup mixed nuts   Copyright 2020  Academy of Nutrition and Dietetics. All rights  reserved    Sodium Free Flavoring Tips    When cooking, the following items may be used for flavoring instead of salt or seasonings that contain sodium.  Remember: A little bit of spice goes a long way! Be careful not to overseason.  Spice Blend Recipe (makes about ? cup)  5 teaspoons onion powder   2 teaspoons garlic powder   2 teaspoons paprika   2 teaspoon dry mustard   1 teaspoon crushed thyme leaves    teaspoon white pepper    teaspoon celery seed Food Item Flavorings  Beef Basil, bay leaf, caraway, curry, dill, dry mustard, garlic, grape jelly, green pepper, mace, marjoram, mushrooms (fresh), nutmeg, onion or onion powder, parsley, pepper, rosemary, sage  Chicken Basil, cloves, cranberries, mace, mushrooms (fresh), nutmeg, oregano, paprika, parsley, pineapple, saffron, sage, savory, tarragon, thyme, tomato, turmeric  Egg Chervil, curry, dill, dry mustard, garlic or garlic powder, green pepper, jelly, mushrooms (fresh), nutmeg, onion powder, paprika, parsley, rosemary, tarragon, tomato  Fish Basil, bay leaf, chervil, curry, dill, dry mustard, green pepper, lemon juice, marjoram, mushrooms (fresh), paprika, pepper, tarragon, tomato, turmeric  Lamb Cloves, curry, dill, garlic or garlic powder, mace, mint, mint jelly, onion, oregano, parsley, pineapple, rosemary, tarragon, thyme  Pork Applesauce, basil, caraway, chives, cloves, garlic or garlic powder, onion or onion powder, rosemary, thyme  Veal Apricots, basil, bay leaf, currant jelly, curry, ginger, marjoram, mushrooms (fresh), oregano, paprika  Vegetables Basil, dill, garlic or garlic powder, ginger, lemon juice, mace, marjoram, nutmeg, onion or onion powder, tarragon, tomato, sugar or sugar substitute, salt-free salad dressing, vinegar  Desserts Allspice, anise, cinnamon, cloves, ginger, mace, nutmeg, vanilla extract, other extracts   Copyright 2020  Academy of Nutrition and Dietetics. All rights reserved

## 2018-10-09 ENCOUNTER — Observation Stay (HOSPITAL_COMMUNITY): Payer: Medicare Other

## 2018-10-09 DIAGNOSIS — E785 Hyperlipidemia, unspecified: Secondary | ICD-10-CM | POA: Diagnosis present

## 2018-10-09 DIAGNOSIS — M159 Polyosteoarthritis, unspecified: Secondary | ICD-10-CM

## 2018-10-09 DIAGNOSIS — I130 Hypertensive heart and chronic kidney disease with heart failure and stage 1 through stage 4 chronic kidney disease, or unspecified chronic kidney disease: Secondary | ICD-10-CM | POA: Diagnosis present

## 2018-10-09 DIAGNOSIS — E876 Hypokalemia: Secondary | ICD-10-CM | POA: Diagnosis present

## 2018-10-09 DIAGNOSIS — Z8249 Family history of ischemic heart disease and other diseases of the circulatory system: Secondary | ICD-10-CM | POA: Diagnosis not present

## 2018-10-09 DIAGNOSIS — I080 Rheumatic disorders of both mitral and aortic valves: Secondary | ICD-10-CM | POA: Diagnosis present

## 2018-10-09 DIAGNOSIS — I252 Old myocardial infarction: Secondary | ICD-10-CM | POA: Diagnosis not present

## 2018-10-09 DIAGNOSIS — I129 Hypertensive chronic kidney disease with stage 1 through stage 4 chronic kidney disease, or unspecified chronic kidney disease: Secondary | ICD-10-CM | POA: Diagnosis not present

## 2018-10-09 DIAGNOSIS — Z823 Family history of stroke: Secondary | ICD-10-CM | POA: Diagnosis not present

## 2018-10-09 DIAGNOSIS — I4821 Permanent atrial fibrillation: Secondary | ICD-10-CM | POA: Diagnosis present

## 2018-10-09 DIAGNOSIS — Z8673 Personal history of transient ischemic attack (TIA), and cerebral infarction without residual deficits: Secondary | ICD-10-CM | POA: Diagnosis not present

## 2018-10-09 DIAGNOSIS — I480 Paroxysmal atrial fibrillation: Secondary | ICD-10-CM

## 2018-10-09 DIAGNOSIS — J90 Pleural effusion, not elsewhere classified: Secondary | ICD-10-CM | POA: Diagnosis not present

## 2018-10-09 DIAGNOSIS — R7989 Other specified abnormal findings of blood chemistry: Secondary | ICD-10-CM | POA: Diagnosis present

## 2018-10-09 DIAGNOSIS — I509 Heart failure, unspecified: Secondary | ICD-10-CM

## 2018-10-09 DIAGNOSIS — I2510 Atherosclerotic heart disease of native coronary artery without angina pectoris: Secondary | ICD-10-CM | POA: Diagnosis present

## 2018-10-09 DIAGNOSIS — Z90710 Acquired absence of both cervix and uterus: Secondary | ICD-10-CM | POA: Diagnosis not present

## 2018-10-09 DIAGNOSIS — I350 Nonrheumatic aortic (valve) stenosis: Secondary | ICD-10-CM | POA: Diagnosis not present

## 2018-10-09 DIAGNOSIS — Z85528 Personal history of other malignant neoplasm of kidney: Secondary | ICD-10-CM | POA: Diagnosis not present

## 2018-10-09 DIAGNOSIS — Z806 Family history of leukemia: Secondary | ICD-10-CM | POA: Diagnosis not present

## 2018-10-09 DIAGNOSIS — M8949 Other hypertrophic osteoarthropathy, multiple sites: Secondary | ICD-10-CM | POA: Diagnosis present

## 2018-10-09 DIAGNOSIS — Z882 Allergy status to sulfonamides status: Secondary | ICD-10-CM | POA: Diagnosis not present

## 2018-10-09 DIAGNOSIS — Z20828 Contact with and (suspected) exposure to other viral communicable diseases: Secondary | ICD-10-CM | POA: Diagnosis present

## 2018-10-09 DIAGNOSIS — I10 Essential (primary) hypertension: Secondary | ICD-10-CM | POA: Diagnosis not present

## 2018-10-09 DIAGNOSIS — Y92009 Unspecified place in unspecified non-institutional (private) residence as the place of occurrence of the external cause: Secondary | ICD-10-CM | POA: Diagnosis not present

## 2018-10-09 DIAGNOSIS — E7800 Pure hypercholesterolemia, unspecified: Secondary | ICD-10-CM | POA: Diagnosis present

## 2018-10-09 DIAGNOSIS — Z951 Presence of aortocoronary bypass graft: Secondary | ICD-10-CM | POA: Diagnosis not present

## 2018-10-09 DIAGNOSIS — Z91048 Other nonmedicinal substance allergy status: Secondary | ICD-10-CM | POA: Diagnosis not present

## 2018-10-09 DIAGNOSIS — N183 Chronic kidney disease, stage 3 (moderate): Secondary | ICD-10-CM

## 2018-10-09 DIAGNOSIS — Z66 Do not resuscitate: Secondary | ICD-10-CM | POA: Diagnosis present

## 2018-10-09 DIAGNOSIS — G4700 Insomnia, unspecified: Secondary | ICD-10-CM | POA: Diagnosis present

## 2018-10-09 DIAGNOSIS — Z9181 History of falling: Secondary | ICD-10-CM | POA: Diagnosis not present

## 2018-10-09 DIAGNOSIS — I5041 Acute combined systolic (congestive) and diastolic (congestive) heart failure: Secondary | ICD-10-CM | POA: Diagnosis not present

## 2018-10-09 DIAGNOSIS — W010XXA Fall on same level from slipping, tripping and stumbling without subsequent striking against object, initial encounter: Secondary | ICD-10-CM | POA: Diagnosis present

## 2018-10-09 DIAGNOSIS — I5043 Acute on chronic combined systolic (congestive) and diastolic (congestive) heart failure: Secondary | ICD-10-CM | POA: Diagnosis present

## 2018-10-09 LAB — BLOOD GAS, ARTERIAL
Acid-Base Excess: 4.6 mmol/L — ABNORMAL HIGH (ref 0.0–2.0)
Bicarbonate: 27.9 mmol/L (ref 20.0–28.0)
Drawn by: 560031
FIO2: 21
O2 Saturation: 95.4 %
Patient temperature: 98.6
pCO2 arterial: 36.1 mmHg (ref 32.0–48.0)
pH, Arterial: 7.5 — ABNORMAL HIGH (ref 7.350–7.450)
pO2, Arterial: 74.1 mmHg — ABNORMAL LOW (ref 83.0–108.0)

## 2018-10-09 LAB — BASIC METABOLIC PANEL
Anion gap: 14 (ref 5–15)
BUN: 54 mg/dL — ABNORMAL HIGH (ref 8–23)
CO2: 25 mmol/L (ref 22–32)
Calcium: 9.6 mg/dL (ref 8.9–10.3)
Chloride: 96 mmol/L — ABNORMAL LOW (ref 98–111)
Creatinine, Ser: 1.95 mg/dL — ABNORMAL HIGH (ref 0.44–1.00)
GFR calc Af Amer: 27 mL/min — ABNORMAL LOW (ref >60)
GFR calc non Af Amer: 23 mL/min — ABNORMAL LOW (ref >60)
Glucose, Bld: 112 mg/dL — ABNORMAL HIGH (ref 70–99)
Potassium: 3.8 mmol/L (ref 3.5–5.1)
Sodium: 135 mmol/L (ref 135–145)

## 2018-10-09 LAB — T3, FREE: T3, Free: 3.2 pg/mL (ref 2.0–4.4)

## 2018-10-09 LAB — BRAIN NATRIURETIC PEPTIDE: B Natriuretic Peptide: 1183.6 pg/mL — ABNORMAL HIGH (ref 0.0–100.0)

## 2018-10-09 MED ORDER — MAGNESIUM HYDROXIDE 400 MG/5ML PO SUSP
15.0000 mL | Freq: Once | ORAL | Status: AC
  Administered 2018-10-09: 15 mL via ORAL
  Filled 2018-10-09: qty 30

## 2018-10-09 MED ORDER — FUROSEMIDE 10 MG/ML IJ SOLN
80.0000 mg | INTRAMUSCULAR | Status: DC
  Administered 2018-10-09 – 2018-10-10 (×4): 80 mg via INTRAVENOUS
  Filled 2018-10-09 (×4): qty 8

## 2018-10-09 MED ORDER — TRAZODONE HCL 50 MG PO TABS
50.0000 mg | ORAL_TABLET | Freq: Every day | ORAL | Status: DC
  Administered 2018-10-09 – 2018-10-10 (×2): 50 mg via ORAL
  Filled 2018-10-09 (×2): qty 1

## 2018-10-09 MED ORDER — METOLAZONE 5 MG PO TABS
10.0000 mg | ORAL_TABLET | ORAL | Status: DC
  Administered 2018-10-10 – 2018-10-11 (×2): 10 mg via ORAL
  Filled 2018-10-09 (×2): qty 2

## 2018-10-09 NOTE — Progress Notes (Signed)
Upon assessment this morning pt was alert and oriented, and talkative. Upon reassessment for medication administration, patient states feeling sleepy, vitals are as follows:      10/09/18 0845  Vitals  BP (!) 97/46  MAP (mmHg) (!) 63  BP Method Automatic  Pulse Rate (!) 50  MEWS Score  MEWS RR 0  MEWS Pulse 1  MEWS Systolic 1  MEWS LOC 0  MEWS Temp 0  MEWS Score 2  MEWS Score Color Yellow   Ganji MD paged. Orders received to hold all meds at this time.  Patient is currently lying in bed with eyes closed.

## 2018-10-09 NOTE — TOC Progression Note (Addendum)
Transition of Care [facility]) - Progression Note    Patient Details  Name: Jodi [name] MRN: [id number] Date of Birth: February 24, 1932  [facility] of [facility] CM/SW Contact  Zenon Mayo, RN Phone Number: 10/09/2018, 2:09 PM  Clinical Narrative:    Patient states she will be going to her daughters home at [place] she is active with [facility] for HHPT, will need to add HHRN,HHOT, MD and [facility] with [facility]  notified.  Also Zack notified for 3 n 1, he will bring to patient room.  Address patient is going to with daughter [name] is [address] [place], [place] [address].  Daughter cell is [phone]   Expected Discharge Plan: [place] Barriers to Discharge: No Barriers Identified  Expected Discharge Plan and Services Expected Discharge Plan: [place] In-house Referral: NA Discharge Planning Services: CM Consult Post Acute Care Choice: [place] arrangements for the past 2 months: Single Family Home                 DME Arranged: 3-N-1, [place] rolling   Date DME Agency Contacted: 10/09/18 Time DME Agency Contacted: [id number] Representative spoke with at DME Agency: [place] Arranged: RN, Disease Management, PT, OT [place] Agency: [place] Date [place]: 10/09/18 Time [place]: 46 Representative spoke with at [place]: cory   Social Determinants of Health ([place]) Interventions    Readmission Risk Interventions No flowsheet data found.

## 2018-10-09 NOTE — Progress Notes (Signed)
   Vital Signs MEWS/VS Documentation      10/09/2018 0843 10/09/2018 0845 10/09/2018 1013 10/09/2018 1057   MEWS Score:  2  2  0  0   MEWS Score Color:  Yellow  Yellow  Green  Green   Pulse:  (!) 49  (!) 50  (!) 56  (!) 58   BP:  (!) 92/27  (!) 97/46  (!) 132/59  128/64           Female Jodi [name] 10/09/2018,11:00 AM

## 2018-10-09 NOTE — Progress Notes (Signed)
PCP Visit  Ms Deshong is tired but breathing a little easier and not in distress.     We discussed palliative care.  She does not wish resuscitation but enjoys life, especially sitting on her swing.   She does not wish to speak with hospice or PC currently but may in the future.  Aim is to continue symptomatic treatments and to allow her to return to her apartment after a brief stay with her relatives  Thank you to the [facility]MT[facility] and Dr Einar Gip for your care  Jodi [name]

## 2018-10-09 NOTE — Progress Notes (Signed)
Subjective:  Feels tired, states that she did not sleep well last night due to restless leg.  States that dyspnea has improved.  Leg edema still persist.  Intake/Output from previous day:  I/O last 3 completed shifts: In: 1200 [P.O.:1200] Out: 4800 [Urine:4800] Total I/O In: 240 [P.O.:240] Out: 850 [Urine:850]  Blood pressure (!) 148/111, pulse 70, temperature 98.2 F (36.8 C), temperature source Oral, resp. rate 20, height 5' 3"  (1.6 m), weight 70.6 kg, SpO2 95 %. Physical Exam  Constitutional: She appears well-developed and well-nourished. She is sleeping. No distress.  HENT:  Head: Atraumatic.  Eyes: Conjunctivae are normal.  Neck: Neck supple. No JVD present. No thyromegaly present.  Cardiovascular: Intact distal pulses and normal pulses. An irregularly irregular rhythm present. Exam reveals no gallop, no S3 and no S4.  Murmur heard.  Harsh mid to late systolic murmur is present with a grade of 3/6 at the upper right sternal border radiating to the neck. Pulses:      Carotid pulses are on the right side with bruit and on the left side with bruit. S1 is variable and soft, S2 is muffled. 2+ bilateral below-knee pitting edema  Pulmonary/Chest: Effort normal and breath sounds normal.  Abdominal: Soft. Bowel sounds are normal.  Musculoskeletal: Normal range of motion.        General: No edema.  Skin: Skin is warm and dry.  Psychiatric: She has a normal mood and affect.   Lab Results: BMP BNP (last 3 results) Recent Labs    08/27/18 0425 10/07/18 1057 10/09/18 0539  BNP 1,002.5* 1,241.4* 1,183.6*   ProBNP (last 3 results) No results for input(s): PROBNP in the last 8760 hours. BMP Latest Ref Rng & Units 10/09/2018 10/08/2018 10/07/2018  Glucose 70 - 99 mg/dL 112(H) 106(H) -  BUN 8 - 23 mg/dL 54(H) 49(H) -  Creatinine 0.44 - 1.00 mg/dL 1.95(H) 1.94(H) 2.02(H)  BUN/Creat Ratio 12 - 28 - - -  Sodium 135 - 145 mmol/L 135 138 -  Potassium 3.5 - 5.1 mmol/L 3.8 4.0 -  Chloride  98 - 111 mmol/L 96(L) 100 -  CO2 22 - 32 mmol/L 25 25 -  Calcium 8.9 - 10.3 mg/dL 9.6 9.4 -   Hepatic Function Latest Ref Rng & Units 12/04/2016 08/25/2016 08/17/2016  Total Protein 6.5 - 8.1 g/dL 6.8 7.1 7.2  Albumin 3.5 - 5.0 g/dL 4.1 4.3 4.6  AST 15 - 41 U/L 29 34 34  ALT 14 - 54 U/L 15 24 23   Alk Phosphatase 38 - 126 U/L 80 60 59  Total Bilirubin 0.3 - 1.2 mg/dL 1.4(H) 1.7(H) 1.3(H)   CBC Latest Ref Rng & Units 10/07/2018 10/07/2018 08/25/2018  WBC 4.0 - 10.5 K/uL 9.3 8.8 8.2  Hemoglobin 12.0 - 15.0 g/dL 11.8(L) 10.7(L) 11.8(L)  Hematocrit 36.0 - 46.0 % 37.0 33.4(L) 36.2  Platelets 150 - 400 K/uL 208 192 177   Lipid Panel     Component Value Date/Time   CHOL 176 11/09/2015 1127   TRIG 169 (H) 11/09/2015 1127   HDL 53 11/09/2015 1127   CHOLHDL 3.3 11/09/2015 1127   VLDL 34 (H) 11/09/2015 1127   LDLCALC 89 11/09/2015 1127   LDLDIRECT 79 10/22/2011 1448   Cardiac Panel (last 3 results) No results for input(s): CKTOTAL, CKMB, TROPONINI, RELINDX in the last 72 hours.  HEMOGLOBIN A1C Lab Results  Component Value Date   HGBA1C 6.4 (H) 08/26/2018   MPG 136.98 08/26/2018   TSH Recent Labs    08/28/18 6861  10/08/18 1139  TSH 7.322* 6.769*   Imaging: Imaging results have been reviewed  Cardiac Studies:  left + right Heart catheterization 11/24/2013: Moderate pulmonary hypertension, PA pressure 46/18 mmHg. Cardiac output 3.96, can't take index 2.2. SVG to RCA ostial 90%, patent free RIMA to RI, SVG to OM1, LIMA to LAD. Aortic valve area 1.1 cm, Mean gradient of 14 mmHg. Moderate to severe mitral regurgitation, LVEF 40-45% with basal to midinferior akinesis.  Nocturnal oximetry 03/07/2017: No significant hypoxemia.  Echocardiogram07/21/2020: 1. The left ventricle has moderately reduced systolic function, with an ejection fraction of 35-40%. There is moderately increased left ventricular wall thickness. Indeterminated due to mitral apparatus calcification. There is  abnormal septal motion  consistent with post-operative status. Left ventricular diffuse hypokinesis. Severe hypokinesis of the left ventricular, entire inferior wall. Severe hypokinesis of the left ventricular, entire inferoseptal wall. 2. The right ventricle has moderately reduced systolic function. The cavity was moderately enlarged. There is no increase in right ventricular wall thickness. 3. Left atrial size was severely dilated. 4. The mitral valve is degenerative. Moderate thickening of the mitral valve leaflet. Moderate calcification of the mitral valve leaflet. Mitral valve regurgitation is mild to moderate by color flow Doppler. 5. The tricuspid valve is grossly normal. Tricuspid valve regurgitation is mild-moderate. Pulmonary hypertension is moderate. PA pressure 48 mm Hg (RA 15) 6. The aortic valve is tricuspid. Severely thickening of the aortic valve. Severe calcifcation of the aortic valve. Moderate-severe stenosis of the aortic valve. The gradients may be underestimated due to poor signial and low LVEF. 7. There is evidence of mild plaque in the aortic root. 8. The interatrial septum was not well visualized.  Scheduled Meds: . apixaban  2.5 mg Oral BID  . cholecalciferol  2,000 Units Oral Daily  . furosemide  80 mg Intravenous BID  . isosorbide-hydrALAZINE  2 tablet Oral TID  . metolazone  5 mg Oral Daily  . metoprolol tartrate  12.5 mg Oral BID  . multivitamin with minerals  1 tablet Oral Daily  . rosuvastatin  20 mg Oral Daily  . sodium chloride flush  3 mL Intravenous Q12H  . spironolactone  12.5 mg Oral Daily  . traZODone  50 mg Oral QHS  . vitamin B-12  1,000 mcg Oral Daily   Continuous Infusions: . sodium chloride     PRN Meds:.sodium chloride, acetaminophen, albuterol, Muscle Rub, ondansetron (ZOFRAN) IV, sodium chloride flush  Assessment/Plan:  1.  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  Chronic  kidney disease stage IV worsened by CHF, serum creatinine at baseline 3.  Permanent atrial fibrillation CHA2DS2-VASc Score is 5.  Yearly risk of stroke: 6.7%.  (CHF; HTN; vasc disease DM,  Female = 1; Age <65 =0; 65-74 = 1,  >75 =2; stroke = 2).    EKG 10/07/2018: Atrial fibrillation with controlled ventricular response at the rate of 69 bpm, normal axis, IVCD, LVH with repolarization abnormality.  Single PVC.  Nonspecific T abnormality.  No significant change from 08/26/2018.  Telemetry 04/25/2018: Atrial fibrillation with controlled ventricular response, occasional PVCs, occasional ventricular couplets.  No significant heart block.  Recommendation: Patient still has significant fluid overload state, BNP still elevated, will increase furosemide dosing to 3 times daily and also increase metolazone to 10 mg daily, watch at least for 1 more day prior to discharge.  She is also fatigued today as he did not sleep well last night due to restless leg syndrome.  Also  blood pressure is recorded as high here, in fact patient had low blood pressure this morning and medications had to be held.  I think it is an error due to atrial fibrillation.  Would not make any changes to her blood pressure medications or heart failure medications for now with regard to beta-blockers.  Not on ACE inhibitors due to renal failure.   [name], M.D. 10/09/2018, 12:52 PM [facility], [place] Pager: [phone] Office: [phone] If no answer: [phone]

## 2018-10-09 NOTE — Progress Notes (Signed)
Family Medicine Teaching Service Daily Progress Note Intern Pager: [phone]  Patient name: Jodi [name] Medical record number: [id number] Date of birth: [birth year] Age: 85 y.o. Gender: female  Primary Care Provider: [name], MD Consultants: Dr. Einar Gip Cardiologist,   Code Status: DNR   Pt Overview and Major Events to Date:  Patient admitted 10/07/2018 for increasing shortness of breath and lower extremity edema, likely secondary to acute CHF versus worsening aortic stenosis  Assessment and Plan: Jodi [name] is a 85 y.o. female with history of heart failure, hyperlipidemia, hypertension, CAD, CABG x2, AFIB, aortic stenosis, MR, CKD, OA and is admitted for worsening shortness of breath and lower extremity edema.  Admitted for acute exacerbation of CHF likely due to multi valvular disease.    Acute on chronic combined systolic and diastolic congestive heart failure  Patient's RN called as patient was not arousing at the time of medication administeration.  Dr. Einar Gip paged and medications held.  Pateint was hypotensive 90s/40s and bradycardic 50s. Patient did not sleep well overnight.  Resting comfortably in bed. On exam 2+ bilateral pitting edema to the knees rales present in the lower lung fields.  Jodi [name] diuresed 3.1 L in the previous 24 hours, she is down 4.2 L since admission.  Admission weight was 159.6 pounds (72.4 kg), days weight is 155.6 pounds (70.6 kg) Weight change: -1.814 kg (4 lbs).  BNP decreased to 1183 from 1241.  Best estimated dry weight is 149 pounds.    Patient on heart healthy diet with 1.5 L fluid restriction.  Per cardiology continue with current medical therapy.  This is the third admission for heart failure in the last 6 months, likely due to poor diet but also progression of multi valvular disease.  Recently, patient increased salt intake in her diet.  Dr. Einar Gip reports once heart failure stabilized potential increase in metoprolol could  benefit the patient.  Cardiology continues to follow. ECHO on 08/26/2018 showed an EF of 35-40% with LV  diffuse hypokinesis and mitral valve degeneration, severely thickened aortic valve with moderate to severe stenosis.   In chart review, the patient and and her daughters have been discussing her poor prognosis with Dr. Einar Gip.   -Dr. Erin Hearing, her PCP,  spoke with patient regarding palliative care however Kapri was not interested in speaking with the palliative care team at this time.  - Continue to diurese patient  -Patient's cardiologist following, Dr. Einar Gip,  appreciate recommendations. -Continue Lasix 80 mg twice daily  -Continue metolazone 5 mg daily -Continue Spironolactone 12.5 mg daily --Daily BMP OT: Recommends outpatient home health OT, patient may benefit from 3 in 1 bedside commode PT: Recommends home health PT, patient may benefit from a rolling walker with 5 wheels  Multiple Joint OA - Continue home medication management  - Tylenol every 4 hours as needed  CKD Stage III Creatinine improving today 1.95, patient's baseline is 1.9, admission Cr 2.16 - Avoid nephrotoxic agents  -Vitamin D 2000 units -Daily BMP  AFIB Continue home medications - Eliquis 2.5 mg twice daily - Metoprolol titrate 12.5 mg twice daily -  Hypercholesteremia  Continue home medications  - Rosuvastatin 20 mg daily  HTN Patiently previously well controlled on home medications.  Continue home medications - Isosorbide hydralazine 20mg  -37.5 mg 3 times daily  Insomnia  Patient has not requested Ambien during her admission however did not sleep well last night. Trazadone added.  -Trazadone 50 mg qhs   -Discontinued 10 mg Ambien as it  is not recommended in the elderly   FEN/GI: Peripheral IV PPx: home Eliquis   Disposition: Likely home pending medical clearance  Subjective:  Jodi [name] did not sleep well. No significant overnight events.    Objective: Temp:  [97.7 F (36.5  C)-98.3 F (36.8 C)] 97.9 F (36.6 C) (09/03 0729) Pulse Rate:  [50-67] 50 (09/03 0845) Resp:  [18-20] 20 (09/03 0729) BP: (97-149)/(46-85) 97/46 (09/03 0845) SpO2:  [92 %-96 %] 94 % (09/03 0729) Weight:  [70.6 kg] 70.6 kg (09/03 0442)  Physical Exam:  GEN: drowsy but responsive to commands CV: irregular-irregular rate and rhythm, murmur appreciated  RESP: no increased work of breathing, rales bilaterally lower lung fields ABD: Bowel sounds present. Soft, Nontender MSK: 2+ pitting edema to the knee, bilaterally,  no cyanosis noted SKIN: warm, dry, linear scar from CABG graft NEURO: responsive to commands, drowsy    Laboratory: Recent Labs  Lab 10/07/18 1057 10/07/18 1900  WBC 8.8 9.3  HGB 10.7* 11.8*  HCT 33.4* 37.0  PLT 192 208   Recent Labs  Lab 10/07/18 1057 10/07/18 1900 10/08/18 0418 10/09/18 0539  NA 138  --  138 135  K 4.1  --  4.0 3.8  CL 102  --  100 96*  CO2 23  --  25 25  BUN 50*  --  49* 54*  CREATININE 2.16* 2.02* 1.94* 1.95*  CALCIUM 9.6  --  9.4 9.6  GLUCOSE 131*  --  106* 112*     Imaging/Diagnostic Tests: Dg Sacrum/coccyx  Result Date: 10/07/2018 CLINICAL DATA:  Recent fall with sacral pain, initial encounter EXAM: SACRUM AND COCCYX - 2+ VIEW COMPARISON:  08/17/2016 FINDINGS: Pelvic ring is intact. The sacral ala are unremarkable. No acute fracture is noted. No soft tissue changes are seen. Diffuse vascular calcifications are noted. IMPRESSION: No acute abnormality noted. Electronically Signed   By: Inez Catalina M.D.   On: 10/07/2018 10:44   Nm Pulmonary Vent And Perf (v/q Scan)  Result Date: 10/07/2018 CLINICAL DATA:  Worsening shortness of breath over the past day. EXAM: NUCLEAR MEDICINE VENTILATION - PERFUSION LUNG SCAN TECHNIQUE: Ventilation images were obtained in multiple projections using inhaled aerosol Tc-16m DTPA. Perfusion images were obtained in multiple projections after intravenous injection of Tc-55m MAA. RADIOPHARMACEUTICALS:  30.6  mCi of Tc-46m DTPA aerosol inhalation and 1.5 mCi Tc84m MAA IV COMPARISON:  Single-view of the chest earlier today. FINDINGS: Ventilation: No focal ventilation defect. Perfusion: No wedge shaped peripheral perfusion defects to suggest acute pulmonary embolism. IMPRESSION: Negative for pulmonary embolus. Electronically Signed   By: Inge Rise M.D.   On: 10/07/2018 16:03   Dg Chest Port 1 View  Result Date: 10/07/2018 CLINICAL DATA:  Acute on chronic shortness of breath EXAM: PORTABLE CHEST 1 VIEW COMPARISON:  08/25/2018 FINDINGS: Cardiac shadow remains enlarged. Postsurgical changes are again seen and stable. The lungs are well aerated bilaterally. Small right pleural effusion is noted. Mild stable vascular congestion is noted. IMPRESSION: New small right pleural effusion. Electronically Signed   By: Inez Catalina M.D.   On: 10/07/2018 10:44    Lyndee Hensen, MD 10/09/2018, 9:44 AM PGY-1, [place] Intern pager: [phone], text pages welcome

## 2018-10-09 NOTE — Progress Notes (Signed)
Per Einar Gip MD to give morning medications now. Medications given. Currently patient is sitting at edge of bed eating her lunch. Call bell within reach. Nurse spoke with [name] regarding patients plan of care with patients[' permission.

## 2018-10-09 NOTE — Progress Notes (Signed)
   Vital Signs MEWS/VS Documentation      10/09/2018 0729 10/09/2018 0758 10/09/2018 0841 10/09/2018 0845   MEWS Score:  0  0  2  2   MEWS Score Color:  Green  Green  Yellow  Yellow   Resp:  20  -  -  -   Pulse:  (!) 54  -  (!) 50  (!) 50   BP:  (!) 146/59  -  (!) 97/46  (!) 97/46   Temp:  97.9 F (36.6 C)  -  -  -   O2 Device:  Room Air  -  -  -   Level of Consciousness:  -  Alert  -  Einar Gip MD made aware at [phone]. Read previous note. Will implement yellow MEWS protocol.      [place] 10/09/2018,9:55 AM

## 2018-10-10 ENCOUNTER — Telehealth: Payer: Self-pay

## 2018-10-10 LAB — BASIC METABOLIC PANEL
Anion gap: 13 (ref 5–15)
Anion gap: 15 (ref 5–15)
BUN: 58 mg/dL — ABNORMAL HIGH (ref 8–23)
BUN: 60 mg/dL — ABNORMAL HIGH (ref 8–23)
CO2: 30 mmol/L (ref 22–32)
CO2: 29 mmol/L (ref 22–32)
Calcium: 9.7 mg/dL (ref 8.9–10.3)
Calcium: 10.2 mg/dL (ref 8.9–10.3)
Chloride: 93 mmol/L — ABNORMAL LOW (ref 98–111)
Chloride: 91 mmol/L — ABNORMAL LOW (ref 98–111)
Creatinine, Ser: 2.12 mg/dL — ABNORMAL HIGH (ref 0.44–1.00)
Creatinine, Ser: 2.18 mg/dL — ABNORMAL HIGH (ref 0.44–1.00)
GFR calc Af Amer: 24 mL/min — ABNORMAL LOW (ref >60)
GFR calc Af Amer: 24 mL/min — ABNORMAL LOW (ref >60)
GFR calc non Af Amer: 21 mL/min — ABNORMAL LOW (ref >60)
GFR calc non Af Amer: 20 mL/min — ABNORMAL LOW (ref >60)
Glucose, Bld: 165 mg/dL — ABNORMAL HIGH (ref 70–99)
Glucose, Bld: 105 mg/dL — ABNORMAL HIGH (ref 70–99)
Potassium: 3.4 mmol/L — ABNORMAL LOW (ref 3.5–5.1)
Potassium: 4.1 mmol/L (ref 3.5–5.1)
Sodium: 136 mmol/L (ref 135–145)
Sodium: 135 mmol/L (ref 135–145)

## 2018-10-10 LAB — BRAIN NATRIURETIC PEPTIDE
B Natriuretic Peptide: 1120.7 pg/mL — ABNORMAL HIGH (ref 0.0–100.0)
B Natriuretic Peptide: 943 pg/mL — ABNORMAL HIGH (ref 0.0–100.0)

## 2018-10-10 MED ORDER — POTASSIUM CHLORIDE CRYS ER 20 MEQ PO TBCR
40.0000 meq | EXTENDED_RELEASE_TABLET | Freq: Two times a day (BID) | ORAL | Status: AC
  Administered 2018-10-10 (×2): 40 meq via ORAL
  Filled 2018-10-10 (×2): qty 2

## 2018-10-10 MED ORDER — POLYETHYLENE GLYCOL 3350 17 G PO PACK: 17.0000 g | PACK | Freq: Every day | ORAL | Status: DC

## 2018-10-10 MED ORDER — POLYETHYLENE GLYCOL 3350 17 G PO PACK
17.0000 g | PACK | Freq: Two times a day (BID) | ORAL | Status: DC
  Administered 2018-10-10 – 2018-10-11 (×3): 17 g via ORAL
  Filled 2018-10-10 (×3): qty 1

## 2018-10-10 MED ORDER — FUROSEMIDE 40 MG PO TABS
40.0000 mg | ORAL_TABLET | Freq: Two times a day (BID) | ORAL | Status: DC
  Administered 2018-10-11: 40 mg via ORAL
  Filled 2018-10-10: qty 1

## 2018-10-10 NOTE — Progress Notes (Signed)
Physical Therapy Treatment Patient Details Name: Jodi [name] MRN: [id number] DOB: 05-Oct-1932 Today's Date: 10/10/2018    History of Present Illness Pt is a 85 y/o admitted with CHF exacerbation and rt pleural effusion. PMHx: CHF, HTN, CAD, CABG x2, Afib, aortic stenosis, mitral regurgitation, CKD, MI, CVA, macular degeneration L eye, osteoarthritis    PT Comments    Pt very pleasant eager to return home with increased gait tolerance this session. Pt continues to reach for environmental support for stability with gait and needs to use RW for home mobility and continued gait with staff. Pt able to demonstrate HEP she performs at home with D/C plan still appropriate.  HR 61 SpO2 94% on RA   Follow Up Recommendations  Home health PT;Supervision for mobility/OOB     Equipment Recommendations  None recommended by PT    Recommendations for Other Services       Precautions / Restrictions Precautions Precautions: Fall Restrictions Weight Bearing Restrictions: No    Mobility  Bed Mobility Overal bed mobility: Independent                Transfers Overall transfer level: Modified independent                  Ambulation/Gait Ambulation/Gait assistance: Min guard Gait Distance (Feet): 400 Feet Assistive device: 1 person hand held assist Gait Pattern/deviations: Step-through pattern;Decreased stride length   Gait velocity interpretation: >2.62 ft/sec, indicative of community ambulatory General Gait Details: pt denied rW this session and required slight steadying with HHA during gait. PT encouraged to continue RW use at home due to need for external support for balance and stability   Stairs             Wheelchair Mobility    Modified Rankin (Stroke Patients Only)       Balance Overall balance assessment: Needs assistance;History of Falls Sitting-balance support: Feet supported;No upper extremity supported Sitting balance-Leahy Scale: Good      Standing balance support: Single extremity supported Standing balance-Leahy Scale: Fair                              Cognition Arousal/Alertness: Awake/alert Behavior During Therapy: WFL for tasks assessed/performed Overall Cognitive Status: Within Functional Limits for tasks assessed                                        Exercises General Exercises - Lower Extremity Long Arc Quad: AROM;Both;Seated;20 reps Hip ABduction/ADduction: AROM;Both;Seated;20 reps Hip Flexion/Marching: AROM;Both;Seated;20 reps    General Comments        Pertinent Vitals/Pain Faces Pain Scale: Hurts little more Pain Location: tailbone Pain Descriptors / Indicators: Aching Pain Intervention(s): Limited activity within patient's tolerance;Repositioned    Home Living                      Prior Function            PT Goals (current goals can now be found in the care plan section) Progress towards PT goals: Progressing toward goals    Frequency    Min 3X/week      PT Plan Current plan remains appropriate    Co-evaluation              AM-PAC PT "6 Clicks" Mobility   Outcome Measure  Help needed turning from  your back to your side while in a flat bed without using bedrails?: None Help needed moving from lying on your back to sitting on the side of a flat bed without using bedrails?: None Help needed moving to and from a bed to a chair (including a wheelchair)?: None Help needed standing up from a chair using your arms (e.g., wheelchair or bedside chair)?: None Help needed to walk in hospital room?: A Little Help needed climbing 3-5 steps with a railing? : A Little 6 Click Score: 22    End of Session   Activity Tolerance: Patient tolerated treatment well Patient left: in chair;with call bell/phone within reach Nurse Communication: Mobility status PT Visit Diagnosis: Unsteadiness on feet (R26.81);Muscle weakness (generalized) (M62.81);History  of falling (Z91.81)     Time: 0955-1010 PT Time Calculation (min) (ACUTE ONLY): 15 min  Charges:  $Gait Training: 8-22 mins                     Pau Banh Pam Drown, PT Acute Rehabilitation Services Pager: [phone] Office: [place] 10/10/2018, 10:22 AM

## 2018-10-10 NOTE — Plan of Care (Signed)
  Problem: Education: Goal: Knowledge of General Education information will improve Description: Including pain rating scale, medication(s)/side effects and non-pharmacologic comfort measures Outcome: Progressing   Problem: Health Behavior/Discharge Planning: Goal: Ability to manage health-related needs will improve Outcome: Progressing   Problem: Pain Managment: Goal: General experience of comfort will improve Outcome: Progressing   

## 2018-10-10 NOTE — Progress Notes (Signed)
Patient has periods of bradycardia while sleeping, asymptomatic. Will continue to monitor.

## 2018-10-10 NOTE — Telephone Encounter (Signed)
Jodi [name] calls nurse line stating she would like patient to start taking Ativan instead of Ambien to sleep. Jodi [name] also stated they are looking into moving her to [place] to stay with one of her children, as they do not want her to go to a nursing home.

## 2018-10-10 NOTE — Progress Notes (Addendum)
Family Medicine Teaching Service Daily Progress Note Intern Pager: [phone]  Patient name: Jodi [name] Medical record number: [id number] Date of birth: 12-30-1932 Age: 85 y.o. Gender: female  Primary Care Provider: [name], MD Consultants: Dr. Einar Gip Cardiologist,   Code Status: DNR   Pt Overview and Major Events to Date:  Patient admitted 10/07/2018 for increasing shortness of breath and lower extremity edema, likely secondary to acute CHF versus worsening aortic stenosis  Assessment and Plan: Jodi [name] is a 85 y.o. female with history of heart failure, hyperlipidemia, hypertension, CAD, CABG x2, AFIB, aortic stenosis, MR, CKD, OA and is admitted for worsening shortness of breath and lower extremity edema.  Admitted for acute exacerbation of CHF likely due to multi valvular disease.    Acute on chronic combined systolic and diastolic congestive heart failure    Patient medication regimen resumed yesterday afternoon.  Overnight no significant events.  Vitals stable. This morning she alert and awake has no complaints. On exam lower extremity edema on left > right .  Patient's cardiologist, Dr. Einar Gip, increased metolazone 10 mg dose and IV Lasix 80 mg 3 times daily yesterday.   Ms. Birnbaum diuresed 4.2 L in the previous 24 hours, she is down 7.3 L since admission.  Admission weight was 159.6 pounds (72.4 kg), todays weight is 151.9 pounds (68.9 kg) with weight change oh 7.7 lbs (3.5 kg) since admission.   Best estimated dry weight is 149 pounds. BNP downtrended.  Dr. Einar Gip recommends, patient to be discharged home with 10 mg metolazone daily and 40 mg daily of furosemide.  Patient to follow-up with him on 9/16. TSH and free T4 elevated.   -Dr. Erin Hearing, her PCP, spoke with patient yesterday regarding palliative care however Ms. Buckles declined speaking with the palliative care team at this time but was receptive to the idea in the future.  We appreciate Dr. Margaretha Sheffield  help with facilitating goals of care discussion with patient.  -Per case management, patient to go home with PT, OT, nursing home health care.  -Resume medical management and diuresing patient -Continue Lasix 80 mg IV TID -Continue metolazone 10 mg daily -Continue Spironolactone 12.5 mg daily --Daily BMP  OT: Recommends outpatient home health OT, patient may benefit from 3 in 1 bedside commode PT: Recommends home health PT, patient may benefit from a rolling walker with 5 wheels  Hypokalemia Potassium 4.1 on admission down to 3.4 today.Will replete with 40 mEq of potassium -40 mEq of potassium x2 doses  Multiple Joint OA - Continue home medication management  - Tylenol every 4 hours as needed  CKD Stage III Creatinine uptrending today 2.12 from 1.95 yesterday,   baseline is 1.9, admission Cr 2.16 - Avoid nephrotoxic agents  -Vitamin D 2000 units -Daily BMP  AFIB Continue home medications - Eliquis 2.5 mg twice daily - Metoprolol titrate 12.5 mg twice daily -  Hypercholesteremia  Continue home medications  - Rosuvastatin 20 mg daily  HTN Patiently previously well controlled on home medications.  Continue home medications - Isosorbide-hydralazine (Bidil) 20mg  -37.5 mg 3 times daily  Insomnia   Patient did not sleep well on 9/3, however cannot sleep with trazodone however received Lasix after 9 PM and was afraid to go to sleep as she did not want to wet the bed.  Trazodone given last night. -Trazadone 50 mg qhs   -Discontinued home 10 mg Ambien as it is not recommended in the elderly   FEN/GI: Peripheral IV PPx: home Eliquis  Disposition: Home with daughters with home health pending medical clearance  Subjective:  Jodi [name] did not sleep well as she received Lasix late and was afraid to wet the bed.  Vitals are stable. There were no significant events.  Lower leg edema improving. No  episodes of paroxysmal nocturnal dyspnea.  Reports breathing improving.   Ports pain since fall at home.  Reports constipation and abdominal fullness but had stool this morning.  Denies chest pain.  Objective: Temp:  [97.3 F (36.3 C)-98.2 F (36.8 C)] 97.5 F (36.4 C) (09/04 0349) Pulse Rate:  [52-71] 52 (09/04 0814) Resp:  [18-20] 18 (09/03 1934) BP: (120-153)/(49-111) 120/71 (09/04 0814) SpO2:  [94 %-96 %] 95 % (09/04 0349) Weight:  [68.9 kg] 68.9 kg (09/04 0227)  Physical Exam:   GEN: Alert and oriented, pleasant elderly female sitting on side of the bed CV: irregular-irregular rate and rhythm, systolic murmur present RESP: no increased work of breathing, clear to ascultation bilaterally with bilateral basilar rales,  ABD: Bowel sounds present. Soft, Nontender, mildly distended.  MSK: lower extremity edema L >R, or cyanosis noted SKIN: warm, dry, linear scar s/p CABG draft on LE NEURO: Alert and oriented, grossly normal, moves all extremities appropriately     Laboratory: Recent Labs  Lab 10/07/18 1057 10/07/18 1900  WBC 8.8 9.3  HGB 10.7* 11.8*  HCT 33.4* 37.0  PLT 192 208   Recent Labs  Lab 10/08/18 0418 10/09/18 0539 10/10/18 0511  NA 138 135 136  K 4.0 3.8 3.4*  CL 100 96* 93*  CO2 25 25 30   BUN 49* 54* 58*  CREATININE 1.94* 1.95* 2.12*  CALCIUM 9.4 9.6 9.7  GLUCOSE 106* 112* 165*     Imaging/Diagnostic Tests: Dg Sacrum/coccyx  Result Date: 10/07/2018 CLINICAL DATA:  Recent fall with sacral pain, initial encounter EXAM: SACRUM AND COCCYX - 2+ VIEW COMPARISON:  08/17/2016 FINDINGS: Pelvic ring is intact. The sacral ala are unremarkable. No acute fracture is noted. No soft tissue changes are seen. Diffuse vascular calcifications are noted. IMPRESSION: No acute abnormality noted. Electronically Signed   By: Inez Catalina M.D.   On: 10/07/2018 10:44   Nm Pulmonary Vent And Perf (v/q Scan)  Result Date: 10/07/2018 CLINICAL DATA:  Worsening shortness of breath over the past day. EXAM: NUCLEAR MEDICINE VENTILATION - PERFUSION LUNG  SCAN TECHNIQUE: Ventilation images were obtained in multiple projections using inhaled aerosol Tc-92m DTPA. Perfusion images were obtained in multiple projections after intravenous injection of Tc-34m MAA. RADIOPHARMACEUTICALS:  30.6 mCi of Tc-25m DTPA aerosol inhalation and 1.5 mCi Tc47m MAA IV COMPARISON:  Single-view of the chest earlier today. FINDINGS: Ventilation: No focal ventilation defect. Perfusion: No wedge shaped peripheral perfusion defects to suggest acute pulmonary embolism. IMPRESSION: Negative for pulmonary embolus. Electronically Signed   By: Inge Rise M.D.   On: 10/07/2018 16:03   Dg Chest Port 1 View  Result Date: 10/07/2018 CLINICAL DATA:  Acute on chronic shortness of breath EXAM: PORTABLE CHEST 1 VIEW COMPARISON:  08/25/2018 FINDINGS: Cardiac shadow remains enlarged. Postsurgical changes are again seen and stable. The lungs are well aerated bilaterally. Small right pleural effusion is noted. Mild stable vascular congestion is noted. IMPRESSION: New small right pleural effusion. Electronically Signed   By: Inez Catalina M.D.   On: 10/07/2018 10:44    Lyndee Hensen, MD 10/10/2018, 9:05 AM PGY-1, [place] Intern pager: [phone], text pages welcome

## 2018-10-10 NOTE — Telephone Encounter (Signed)
Spoke w daughter  Brother take ativan twice a day. Wonders if this would be good for her Mom.    Decided to wait ot discuss during her follow up visit

## 2018-10-10 NOTE — Progress Notes (Signed)
Subjective:  She is presently doing well, right leg edema has completely resolved, she has chronic right stent duplex left leg edema.  Dyspnea is improved, she has slept well. Intake/Output from previous day:  I/O last 3 completed shifts: In: 1560 [P.O.:1560] Out: 8338 [Urine:6225] Total I/O In: 200 [P.O.:200] Out: -   Blood pressure 120/71, pulse 61, temperature (!) 97.5 F (36.4 C), resp. rate 18, height 5' 3"  (1.6 m), weight 68.9 kg, SpO2 94 %. Physical Exam  Constitutional: She appears well-developed and well-nourished. She is sleeping. No distress.  HENT:  Head: Atraumatic.  Eyes: Conjunctivae are normal.  Neck: Neck supple. No JVD present. No thyromegaly present.  Cardiovascular: Intact distal pulses and normal pulses. An irregularly irregular rhythm present. Exam reveals no gallop, no S3 and no S4.  Murmur heard.  Harsh mid to late systolic murmur is present with a grade of 3/6 at the upper right sternal border radiating to the neck. Pulses:      Carotid pulses are on the right side with bruit and on the left side with bruit. S1 is variable and soft, S2 is muffled. 2+ left below-knee pitting edema. No edema right leg. No JVD  Pulmonary/Chest: Effort normal and breath sounds normal.  Abdominal: Soft. Bowel sounds are normal.  Musculoskeletal: Normal range of motion.        General: No edema.  Skin: Skin is warm and dry.  Psychiatric: She has a normal mood and affect.   Lab Results: BMP BNP (last 3 results) Recent Labs    10/07/18 1057 10/09/18 0539 10/10/18 0843  BNP 1,241.4* 1,183.6* 1,120.7*   ProBNP (last 3 results) No results for input(s): PROBNP in the last 8760 hours. BMP Latest Ref Rng & Units 10/10/2018 10/09/2018 10/08/2018  Glucose 70 - 99 mg/dL 165(H) 112(H) 106(H)  BUN 8 - 23 mg/dL 58(H) 54(H) 49(H)  Creatinine 0.44 - 1.00 mg/dL 2.12(H) 1.95(H) 1.94(H)  BUN/Creat Ratio 12 - 28 - - -  Sodium 135 - 145 mmol/L 136 135 138  Potassium 3.5 - 5.1 mmol/L 3.4(L)  3.8 4.0  Chloride 98 - 111 mmol/L 93(L) 96(L) 100  CO2 22 - 32 mmol/L 30 25 25   Calcium 8.9 - 10.3 mg/dL 9.7 9.6 9.4   Hepatic Function Latest Ref Rng & Units 12/04/2016 08/25/2016 08/17/2016  Total Protein 6.5 - 8.1 g/dL 6.8 7.1 7.2  Albumin 3.5 - 5.0 g/dL 4.1 4.3 4.6  AST 15 - 41 U/L 29 34 34  ALT 14 - 54 U/L 15 24 23   Alk Phosphatase 38 - 126 U/L 80 60 59  Total Bilirubin 0.3 - 1.2 mg/dL 1.4(H) 1.7(H) 1.3(H)   CBC Latest Ref Rng & Units 10/07/2018 10/07/2018 08/25/2018  WBC 4.0 - 10.5 K/uL 9.3 8.8 8.2  Hemoglobin 12.0 - 15.0 g/dL 11.8(L) 10.7(L) 11.8(L)  Hematocrit 36.0 - 46.0 % 37.0 33.4(L) 36.2  Platelets 150 - 400 K/uL 208 192 177   Lipid Panel     Component Value Date/Time   CHOL 176 11/09/2015 1127   TRIG 169 (H) 11/09/2015 1127   HDL 53 11/09/2015 1127   CHOLHDL 3.3 11/09/2015 1127   VLDL 34 (H) 11/09/2015 1127   LDLCALC 89 11/09/2015 1127   LDLDIRECT 79 10/22/2011 1448   Cardiac Panel (last 3 results) No results for input(s): CKTOTAL, CKMB, TROPONINI, RELINDX in the last 72 hours.  HEMOGLOBIN A1C Lab Results  Component Value Date   HGBA1C 6.4 (H) 08/26/2018   MPG 136.98 08/26/2018   TSH Recent Labs  08/28/18 0412 10/08/18 1139  TSH 7.322* 6.769*   Imaging: Imaging results have been reviewed  Cardiac Studies:  left + right Heart catheterization 11/24/2013: Moderate pulmonary hypertension, PA pressure 46/18 mmHg. Cardiac output 3.96, can't take index 2.2. SVG to RCA ostial 90%, patent free RIMA to RI, SVG to OM1, LIMA to LAD. Aortic valve area 1.1 cm, Mean gradient of 14 mmHg. Moderate to severe mitral regurgitation, LVEF 40-45% with basal to midinferior akinesis.  Nocturnal oximetry 03/07/2017: No significant hypoxemia.  Echocardiogram07/21/2020: 1. The left ventricle has moderately reduced systolic function, with an ejection fraction of 35-40%. There is moderately increased left ventricular wall thickness. Indeterminated due to mitral apparatus  calcification. There is abnormal septal motion  consistent with post-operative status. Left ventricular diffuse hypokinesis. Severe hypokinesis of the left ventricular, entire inferior wall. Severe hypokinesis of the left ventricular, entire inferoseptal wall. 2. The right ventricle has moderately reduced systolic function. The cavity was moderately enlarged. There is no increase in right ventricular wall thickness. 3. Left atrial size was severely dilated. 4. The mitral valve is degenerative. Moderate thickening of the mitral valve leaflet. Moderate calcification of the mitral valve leaflet. Mitral valve regurgitation is mild to moderate by color flow Doppler. 5. The tricuspid valve is grossly normal. Tricuspid valve regurgitation is mild-moderate. Pulmonary hypertension is moderate. PA pressure 48 mm Hg (RA 15) 6. The aortic valve is tricuspid. Severely thickening of the aortic valve. Severe calcifcation of the aortic valve. Moderate-severe stenosis of the aortic valve. The gradients may be underestimated due to poor signial and low LVEF. 7. There is evidence of mild plaque in the aortic root. 8. The interatrial septum was not well visualized.  Scheduled Meds: . apixaban  2.5 mg Oral BID  . cholecalciferol  2,000 Units Oral Daily  . furosemide  80 mg Intravenous [id number]  . isosorbide-hydrALAZINE  2 tablet Oral TID  . metolazone  10 mg Oral Daily  . metoprolol tartrate  12.5 mg Oral BID  . multivitamin with minerals  1 tablet Oral Daily  . polyethylene glycol  17 g Oral BID  . potassium chloride  40 mEq Oral BID  . rosuvastatin  20 mg Oral Daily  . sodium chloride flush  3 mL Intravenous Q12H  . spironolactone  12.5 mg Oral Daily  . traZODone  50 mg Oral QHS  . vitamin B-12  1,000 mcg Oral Daily   Continuous Infusions: . sodium chloride     PRN Meds:.sodium chloride, acetaminophen, albuterol, Muscle Rub, ondansetron (ZOFRAN) IV, sodium chloride flush  Assessment/Plan:  1.   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  Chronic kidney disease stage IV. serum creatinine at baseline 3.  Permanent atrial fibrillation CHA2DS2-VASc Score is 5.  Yearly risk of stroke: 6.7%.  (CHF; HTN; vasc disease DM,  Female = 1; Age <65 =0; 65-74 = 1,  >75 =2; stroke = 2).    EKG 10/07/2018: Atrial fibrillation with controlled ventricular response at the rate of 69 bpm, normal axis, IVCD, LVH with repolarization abnormality.  Single PVC.  Nonspecific T abnormality.  No significant change from 08/26/2018.  Telemetry 09/09/2018: Atrial fibrillation with controlled ventricular response, occasional PVCs, occasional ventricular couplets.  No significant heart block.  Recommendation: Patient is now well compensated, she can be discharged home on Zaroxolyn 10 mg daily and 40 mg twice daily of furosemide.  BNP is still elevated.  Probably watching her to this afternoon with continued IV Lasix and rechecking  BMP around 4 PM today and if trend is decreasing, she can be discharged.  If BNP has not trended down, would probably keep her here for 1 additional day and can be discharged home in the morning.  Serum creatinine has remained stable.  Thyroid panel evaluated.  Will new to the primary team to respond.  I will set up to see me in the office in 10 days.  [name], M.D. 10/10/2018, 10:36 AM [facility] Pager: [phone] Office: [phone] If no answer: [phone]

## 2018-10-11 LAB — BASIC METABOLIC PANEL
Anion gap: 13 (ref 5–15)
BUN: 66 mg/dL — ABNORMAL HIGH (ref 8–23)
CO2: 30 mmol/L (ref 22–32)
Calcium: 9.5 mg/dL (ref 8.9–10.3)
Chloride: 91 mmol/L — ABNORMAL LOW (ref 98–111)
Creatinine, Ser: 2.48 mg/dL — ABNORMAL HIGH (ref 0.44–1.00)
GFR calc Af Amer: 20 mL/min — ABNORMAL LOW (ref >60)
GFR calc non Af Amer: 17 mL/min — ABNORMAL LOW (ref >60)
Glucose, Bld: 137 mg/dL — ABNORMAL HIGH (ref 70–99)
Potassium: 4.1 mmol/L (ref 3.5–5.1)
Sodium: 134 mmol/L — ABNORMAL LOW (ref 135–145)

## 2018-10-11 MED ORDER — METOLAZONE 10 MG PO TABS: 10.0000 mg | ORAL_TABLET | Freq: Every day | ORAL | 0 refills | Status: DC

## 2018-10-11 MED ORDER — TRAZODONE HCL 50 MG PO TABS: 50.0000 mg | ORAL_TABLET | Freq: Every day | ORAL | 0 refills | Status: DC

## 2018-10-11 MED ORDER — FUROSEMIDE 40 MG PO TABS: 40.0000 mg | ORAL_TABLET | Freq: Two times a day (BID) | ORAL | 0 refills | Status: DC

## 2018-10-11 MED ORDER — POLYETHYLENE GLYCOL 3350 17 G PO PACK: 17.0000 g | PACK | Freq: Two times a day (BID) | ORAL | 0 refills | Status: DC

## 2018-10-11 NOTE — Plan of Care (Signed)
  Problem: Education: Goal: Knowledge of General Education information will improve Description: Including pain rating scale, medication(s)/side effects and non-pharmacologic comfort measures Outcome: Adequate for Discharge   Problem: Health Behavior/Discharge Planning: Goal: Ability to manage health-related needs will improve Outcome: Adequate for Discharge   Problem: Pain Managment: Goal: General experience of comfort will improve Outcome: Adequate for Discharge

## 2018-10-11 NOTE — Care Management (Signed)
Notified Jodi [name] that patient will D today. NO other CM needs.

## 2018-10-11 NOTE — Progress Notes (Signed)
   Vital Signs MEWS/VS Documentation      10/10/2018 2114 10/10/2018 2227 10/10/2018 2322 10/10/2018 2359   MEWS Score:  0  2  0  2   MEWS Score Color:  Green  Yellow  Green  Yellow   Pulse:  62  -  -  -   BP:  121/79  -  -  -           Arrington Yohe T Bron Snellings 10/11/2018,1:22 AM

## 2018-10-15 ENCOUNTER — Telehealth: Payer: Self-pay | Admitting: Family Medicine

## 2018-10-15 NOTE — Telephone Encounter (Signed)
Patient needs a virtual appointment this week with Dr. Erin Hearing.  Jodi [name] was going to see if it is okay to double book this but is in a class this morning.  Can you please call her daughter [name] this morning with the answer, at [phone].

## 2018-10-15 NOTE — Telephone Encounter (Signed)
Called.  She is well.  Will set up video visit for tomorrow at 1130.

## 2018-10-17 NOTE — Telephone Encounter (Signed)
-----   Message from Aletha Halim sent at 10/14/2018  9:27 AM EDT ----- Good morning Dr. Erin Hearing.   [name] called to schedule her mother Jodi [name] a f/u virtual visit. She said that you asked to double book her this week if you had no openings. I just wanted to be sure that it was okay to double book that appointment? I will call her to schedule once you give me the okay!  I hope you had a wonderful weekend! Thanks  [employer]

## 2018-10-17 NOTE — Telephone Encounter (Signed)
Spoke with daughter [name] and patient yesterday evening Overall going well.  Edema is much improved.  Sleeping better just getting up frequently to urinate. Wants to move back home alone Family is reluctant She understands risks but is adamant. Discussed it is her decision but want to minimize risk She has bars and walker and alarm.  Discussed removing furniture that may be tripping hazard She will make an inperson follow up with me or Dr Einar Gip as needs blood work

## 2018-10-21 ENCOUNTER — Telehealth: Payer: Self-pay | Admitting: *Deleted

## 2018-10-21 NOTE — Telephone Encounter (Signed)
Jodi [name] needs a verbal order for resumption of home health care.  Can leave a message on VM. Christen Bame, CMA

## 2018-10-22 ENCOUNTER — Ambulatory Visit (INDEPENDENT_AMBULATORY_CARE_PROVIDER_SITE_OTHER): Payer: Medicare Other | Admitting: Cardiology

## 2018-10-22 ENCOUNTER — Other Ambulatory Visit: Payer: Self-pay

## 2018-10-22 ENCOUNTER — Encounter: Payer: Self-pay | Admitting: Cardiology

## 2018-10-22 VITALS — BP 132/60 | HR 55 | Temp 97.8°F | Ht 63.0 in | Wt 145.0 lb

## 2018-10-22 DIAGNOSIS — I5042 Chronic combined systolic (congestive) and diastolic (congestive) heart failure: Secondary | ICD-10-CM

## 2018-10-22 NOTE — Telephone Encounter (Signed)
Verbal orders given to [place] from [place].  Ozella Almond, [place]

## 2018-10-22 NOTE — Telephone Encounter (Signed)
Please call  Thanks Maud
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
## 2018-10-23 ENCOUNTER — Ambulatory Visit: Payer: Medicare Other | Admitting: Cardiology

## 2018-10-23 LAB — BASIC METABOLIC PANEL
BUN/Creatinine Ratio: 37 — ABNORMAL HIGH (ref 12–28)
BUN: 91 mg/dL (ref 8–27)
CO2: 29 mmol/L (ref 20–29)
Calcium: 11 mg/dL — ABNORMAL HIGH (ref 8.7–10.3)
Chloride: 92 mmol/L — ABNORMAL LOW (ref 96–106)
Creatinine, Ser: 2.44 mg/dL — ABNORMAL HIGH (ref 0.57–1.00)
GFR calc Af Amer: 20 mL/min/{1.73_m2} — ABNORMAL LOW (ref >59)
GFR calc non Af Amer: 18 mL/min/{1.73_m2} — ABNORMAL LOW (ref >59)
Glucose: 149 mg/dL — ABNORMAL HIGH (ref 65–99)
Potassium: 3.6 mmol/L (ref 3.5–5.2)
Sodium: 140 mmol/L (ref 134–144)

## 2018-10-23 LAB — BRAIN NATRIURETIC PEPTIDE: BNP: 791.1 pg/mL — ABNORMAL HIGH (ref 0.0–100.0)

## 2018-10-24 ENCOUNTER — Encounter: Payer: Self-pay | Admitting: Cardiology

## 2018-10-24 NOTE — Progress Notes (Signed)
LVM for pt to call back.

## 2018-10-31 ENCOUNTER — Other Ambulatory Visit: Payer: Self-pay | Admitting: Family Medicine

## 2018-11-07 ENCOUNTER — Other Ambulatory Visit: Payer: Self-pay

## 2018-11-07 ENCOUNTER — Encounter: Payer: Self-pay | Admitting: Cardiology

## 2018-11-07 ENCOUNTER — Ambulatory Visit (INDEPENDENT_AMBULATORY_CARE_PROVIDER_SITE_OTHER): Payer: Medicare Other | Admitting: Cardiology

## 2018-11-07 VITALS — BP 139/56 | HR 74 | Temp 98.2°F | Ht 63.0 in | Wt 143.4 lb

## 2018-11-07 DIAGNOSIS — N184 Chronic kidney disease, stage 4 (severe): Secondary | ICD-10-CM | POA: Diagnosis not present

## 2018-11-07 DIAGNOSIS — I129 Hypertensive chronic kidney disease with stage 1 through stage 4 chronic kidney disease, or unspecified chronic kidney disease: Secondary | ICD-10-CM | POA: Diagnosis not present

## 2018-11-07 DIAGNOSIS — I5042 Chronic combined systolic (congestive) and diastolic (congestive) heart failure: Secondary | ICD-10-CM | POA: Diagnosis not present

## 2018-11-07 DIAGNOSIS — I4821 Permanent atrial fibrillation: Secondary | ICD-10-CM
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
## 2018-11-11 NOTE — Telephone Encounter (Signed)
Error

## 2018-11-14 ENCOUNTER — Telehealth: Payer: Self-pay

## 2018-11-14 NOTE — Telephone Encounter (Signed)
Pt daughter called wanting to know about the supplemental oxygen that JG wanted her to have. Do you know anything about that ?

## 2018-11-17 ENCOUNTER — Ambulatory Visit: Payer: Medicare Other | Admitting: Cardiology

## 2018-12-08 ENCOUNTER — Telehealth: Payer: Self-pay

## 2018-12-08 ENCOUNTER — Other Ambulatory Visit: Payer: Self-pay | Admitting: Family Medicine

## 2018-12-08 NOTE — Telephone Encounter (Signed)
PT called about oxygen per Burnetta Sabin will reach out to pt

## 2018-12-12 ENCOUNTER — Other Ambulatory Visit: Payer: Self-pay | Admitting: Family Medicine

## 2018-12-12 ENCOUNTER — Telehealth: Payer: Self-pay

## 2018-12-12 MED ORDER — METOLAZONE 5 MG PO TABS: 5.0000 mg | ORAL_TABLET | Freq: Every day | ORAL | 1 refills | Status: DC

## 2018-12-12 NOTE — Telephone Encounter (Signed)
Med refill request

## 2018-12-17 ENCOUNTER — Encounter: Payer: Self-pay | Admitting: Family Medicine

## 2018-12-17 ENCOUNTER — Ambulatory Visit (INDEPENDENT_AMBULATORY_CARE_PROVIDER_SITE_OTHER): Payer: Medicare Other | Admitting: Family Medicine

## 2018-12-17 ENCOUNTER — Other Ambulatory Visit: Payer: Self-pay

## 2018-12-17 DIAGNOSIS — F5101 Primary insomnia: Secondary | ICD-10-CM

## 2018-12-17 DIAGNOSIS — I5042 Chronic combined systolic (congestive) and diastolic (congestive) heart failure: Secondary | ICD-10-CM | POA: Diagnosis not present

## 2018-12-17 DIAGNOSIS — G2581 Restless legs syndrome: Secondary | ICD-10-CM | POA: Diagnosis not present

## 2018-12-17 DIAGNOSIS — M79673 Pain in unspecified foot: Secondary | ICD-10-CM | POA: Insufficient documentation

## 2018-12-17 MED ORDER — CAPSAICIN 0.025 % EX CREA: TOPICAL_CREAM | Freq: Every day | CUTANEOUS | 3 refills | Status: DC

## 2018-12-17 NOTE — Progress Notes (Signed)
Subjective  Jodi [name] is a 85 y.o. female is presenting with the following  FATIGUE CHF Feels tired all the time but especially when moving around.  Still taking furosemide 40 mg daily as well as metolzaone.  No edema  No unusual shortness of breath   INSOMINIA Chronic.  Trazadone made her feel too groggy the next day.  Taking [name] that does help.  NUMB RESTLESS LEGS Often her feet feel numb or restless at night.  Better when takes [place].  Trying voltaren cream not much help.  No sores or loss of sensation during the day or feeling cold  Chief Complaint noted Review of Symptoms - see HPI PMH - Smoking status noted.    Objective Vital Signs reviewed BP 96/60   Pulse (!) 57   Wt 150 lb 3.2 oz (68.1 kg)   SpO2 98%   BMI 26.61 kg/m  Trace edema in L ankle Lungs - clear Palpable DP pulses bilateral feet.  Normal gross sensation no lesion  Assessments/Plans  Chronic combined systolic and diastolic congestive heart failure (HCC) Minimal edema and clear lungs with low blood pressure and significant fatigue.  Dr Irven Shelling note from Oct suggested stopping lasix but she is still taking with metolazone.  Will stop metolazone and ask her to monitor edema.  Will send to Dr Einar Gip.    Insomnia Not well controlled.  Trazadone not tolerated.  Will continue Ambien.  She recognizes risks but feels the benefits are worth it.    Restless legs Not sure meets strict criteria but bothers her at night. Will start with topical capsicin given all her comorbidities    See after visit summary for details of patient instructions

## 2018-12-17 NOTE — Assessment & Plan Note (Signed)
Not sure meets strict criteria but bothers her at night. Will start with topical capsicin given all her comorbidities

## 2018-12-17 NOTE — Assessment & Plan Note (Signed)
Not well controlled.  Trazadone not tolerated.  Will continue Ambien.  She recognizes risks but feels the benefits are worth it.

## 2018-12-17 NOTE — Patient Instructions (Addendum)
Good to see you today!  Thanks for coming in.  For the Swelling  Stop the blue pill Metolazone   Keep taking the furosemide 40 mg daily   If you have more leg swelling then call me  For the leg numbness  Try Capsacin cream before bed - do not get in your eyes  For Sleep  Can take 1/2 ambien as needed   Check your blood pressure twice a day and call me with your readings next week

## 2018-12-17 NOTE — Assessment & Plan Note (Signed)
Minimal edema and clear lungs with low blood pressure and significant fatigue.  Dr Irven Shelling note from Oct suggested stopping lasix but she is still taking with metolazone.  Will stop metolazone and ask her to monitor edema.  Will send to Dr Einar Gip.

## 2018-12-18 ENCOUNTER — Telehealth: Payer: Self-pay

## 2018-12-18 NOTE — Telephone Encounter (Signed)
-----   Message from [name], MD sent at 12/12/2018  9:58 PM EST ----- No significant desaturation. No need for O2.

## 2018-12-23 ENCOUNTER — Other Ambulatory Visit: Payer: Self-pay

## 2018-12-23 ENCOUNTER — Emergency Department (HOSPITAL_COMMUNITY): Payer: Medicare Other

## 2018-12-23 ENCOUNTER — Emergency Department (HOSPITAL_COMMUNITY)
Admission: EM | Admit: 2018-12-23 | Discharge: 2018-12-24 | Disposition: A | Discharge status: Home / Self Care | Payer: Medicare Other | Attending: Emergency Medicine | Admitting: Emergency Medicine

## 2018-12-23 ENCOUNTER — Telehealth: Payer: Self-pay

## 2018-12-23 DIAGNOSIS — R0602 Shortness of breath: Secondary | ICD-10-CM | POA: Insufficient documentation

## 2018-12-23 DIAGNOSIS — Z5321 Procedure and treatment not carried out due to patient leaving prior to being seen by health care provider: Secondary | ICD-10-CM | POA: Diagnosis not present

## 2018-12-23 LAB — BASIC METABOLIC PANEL
Anion gap: 16 — ABNORMAL HIGH (ref 5–15)
BUN: 77 mg/dL — ABNORMAL HIGH (ref 8–23)
CO2: 23 mmol/L (ref 22–32)
Calcium: 9.7 mg/dL (ref 8.9–10.3)
Chloride: 99 mmol/L (ref 98–111)
Creatinine, Ser: 2.34 mg/dL — ABNORMAL HIGH (ref 0.44–1.00)
GFR calc Af Amer: 21 mL/min — ABNORMAL LOW (ref >60)
GFR calc non Af Amer: 18 mL/min — ABNORMAL LOW (ref >60)
Glucose, Bld: 169 mg/dL — ABNORMAL HIGH (ref 70–99)
Potassium: 3.4 mmol/L — ABNORMAL LOW (ref 3.5–5.1)
Sodium: 138 mmol/L (ref 135–145)

## 2018-12-23 LAB — CBC
HCT: 31.2 % — ABNORMAL LOW (ref 36.0–46.0)
Hemoglobin: 9.9 g/dL — ABNORMAL LOW (ref 12.0–15.0)
MCH: 28.1 pg (ref 26.0–34.0)
MCHC: 31.7 g/dL (ref 30.0–36.0)
MCV: 88.6 fL (ref 80.0–100.0)
Platelets: 171 10*3/uL (ref 150–400)
RBC: 3.52 MIL/uL — ABNORMAL LOW (ref 3.87–5.11)
RDW: 15.9 % — ABNORMAL HIGH (ref 11.5–15.5)
WBC: 8.3 10*3/uL (ref 4.0–10.5)
nRBC: 0 % (ref 0.0–0.2)

## 2018-12-23 LAB — BRAIN NATRIURETIC PEPTIDE: B Natriuretic Peptide: 1158 pg/mL — ABNORMAL HIGH (ref 0.0–100.0)

## 2018-12-23 MED ORDER — SODIUM CHLORIDE 0.9% FLUSH: 3.0000 mL | Freq: Once | INTRAVENOUS | Status: DC

## 2018-12-23 NOTE — ED Triage Notes (Signed)
Pt here for evaluation of shob with exertion with hx of chf. Lung sounds clear and equal. Placed on 4L O2 by EMS.

## 2018-12-23 NOTE — Telephone Encounter (Signed)
Patient calls nurse line to report a weeks worth of at home blood pressure readings.   11/11: 110/58 HR: 77 11/12: 135/62 HR: 57 11/13: 149/88 HR: 67  11/14: 78/38 HR: 54 11/15: 103/48 HR: 58 11/16: 113/79 HR: 65 11/17: 89/66 HR: 49  The only sxs the patient reports is fatigue. Patient takes her blood pressure regime every morning. Last dose was a couple of hours ago. Please advise.

## 2018-12-23 NOTE — Telephone Encounter (Signed)
Pt calls to make sure the message got to Dr. Erin Hearing.  Advised that it did.  We will call back when we hear from him. Christen Bame, CMA

## 2018-12-23 NOTE — ED Notes (Signed)
Jodi [name]: [phone]

## 2018-12-23 NOTE — ED Notes (Signed)
Pt pulled into green hallway

## 2018-12-24 ENCOUNTER — Telehealth: Payer: Self-pay

## 2018-12-24 NOTE — Telephone Encounter (Signed)
Pt's daughter called stating that pt was in the ED yesterday due to SOB, LE edema and also RLS. Pt was not able to sit up there for a long period of time and ended up leaving. She has not slept since then due to continued issues unresolved. Legs are shaking. Her pcp was contacted but they have not received a return call yet and she does have an appt with him tomorrow at 1:30. Her daughter is wanting something to be done prior to that. Please review and advise.//ah

## 2018-12-24 NOTE — Telephone Encounter (Addendum)
Called number - just rang....  Called daughter [name]  - she is there with her   Has more leg swelling especially on the R.  Went to ER last PM with shortness of breath.  Did not stay for full evalu Cxr - possible pneumonia and CM BNP - elevated According to [facility] pulse ox was normal on RA  Asked [name] to give her an extra lasix and to restart metolazone

## 2018-12-24 NOTE — Telephone Encounter (Signed)
Called back she is sleeping now  Discussed issues of low blood pressure and how diuretics can make this worse but she is likely fluid overloaded so need to use diuretics  Asked [name] to give her two lasix tabs in AM (80 mg) OR if she can get the metolazone to give her one lasix tab and one metolazone  Suggested trying capsacin cream for resistless legs and if not improving then we can try oral medications  She will call back tomorrow with a report

## 2018-12-24 NOTE — Telephone Encounter (Signed)
Patients daughter, [name], calls nurse line stating her mother has been up all night with restless leg. [name] also stated they went to ED yesterday due to her breathing issues, however the patient was not seen. Patient is resting comfortably now at [facility], and will remain there until the end of the week. [name] noted some swelling of patients left leg. ED precautions given to [facility] and an apt has been scheduled for Friday.

## 2018-12-24 NOTE — Telephone Encounter (Signed)
Verbally discussed with JG. He advised to take an extra dose of Lasix until appt with pcp tomorrow. Angel aware and she states that Dr. Erin Hearing office called back as well advising to do the same as well as restart metolazone. Only issue is that pt threw metolazone away and insurance will not pay for another right now. Can rx be changed some so that insurance will cover????//ah

## 2018-12-24 NOTE — ED Notes (Signed)
No answer for room 

## 2018-12-25 ENCOUNTER — Other Ambulatory Visit: Payer: Self-pay | Admitting: Cardiology

## 2018-12-25 DIAGNOSIS — I5042 Chronic combined systolic (congestive) and diastolic (congestive) heart failure: Secondary | ICD-10-CM

## 2018-12-25 MED ORDER — BUMETANIDE 2 MG PO TABS: 2.0000 mg | ORAL_TABLET | Freq: Two times a day (BID) | ORAL | 1 refills | Status: DC

## 2018-12-25 NOTE — Telephone Encounter (Signed)
Pt did not have appt with Dr. Erin Hearing but did speak with him. He has made diuretic changes that pt will follow for the next few days per Angel.//ah

## 2018-12-25 NOTE — Telephone Encounter (Signed)
Pay and pick up 10 or 12.. tablets.  We can try to change from lasix to bumetanide. I have sent in Rx

## 2018-12-26 ENCOUNTER — Ambulatory Visit (INDEPENDENT_AMBULATORY_CARE_PROVIDER_SITE_OTHER): Payer: Medicare Other | Admitting: Family Medicine

## 2018-12-26 ENCOUNTER — Other Ambulatory Visit: Payer: Self-pay

## 2018-12-26 VITALS — BP 112/62 | HR 98 | Wt 151.8 lb

## 2018-12-26 DIAGNOSIS — I5041 Acute combined systolic (congestive) and diastolic (congestive) heart failure: Secondary | ICD-10-CM | POA: Diagnosis not present

## 2018-12-26 NOTE — Progress Notes (Signed)
Subjective: Chief Complaint  Patient presents with  . Leg Swelling     HPI: Jodi [name] is a 85 y.o. presenting to clinic today to discuss the following:  1 Leg Edema Patient has been struggling off and on with leg edema, mostly in the left leg, and shortness of breath.  She has a history of HFrEF with EF of 35 to 40%.  In speaking with her PCP, Dr. Tablets, it seems as though patient has very easily been over diuresed and can become hypotensive.  This has been an ongoing issue for her and Dr. Erin Hearing as well as Dr. Einar Gip have been working on a regimen for this patient.  Her daughter called in yesterday, with concerns of patient having swelling, was also seen in ED on 11/17, but never seen by provider.  Patient left after receiving a chest x-ray, BNP, and many hours of sitting in the ED without being seen.  BNP was elevated to 1158, up from 943 in September.  Chest x-ray also showed bilateral lower lobe infiltrates, worse most on right.  Patient is currently taking metolazone once a day and Lasix 40 mg once a day.  Dr. Einar Gip did call in [place] yesterday, but patient's daughter called in on speaker phone and notes that she has not been giving this to her.  She states that she thinks Dr. Einar Gip sent this and because there was a concern that metolazone could not be refilled this soon.  Patient and her daughter know that they have seen improvement in her legs.  Patient notes that her shortness of breath is no worse than it usually is.  No recent fevers.   ROS noted in HPI. Chief complaint noted.  Other Pertinent PMH: HFrEF, hypertension, aortic stenosis, A. fib Past Medical, Surgical, Social, and Family History Reviewed & Updated per EMR.      Social History   Tobacco Use  Smoking Status Never Smoker  Smokeless Tobacco Never Used   Smoking status noted.    Objective: BP 112/62   Pulse 98   Wt 151 lb 12.8 oz (68.9 kg)   SpO2 92%   BMI 26.89 kg/m  Vitals and nursing notes  reviewed  Physical Exam:  General: 85 y.o. female in NAD Cardio: RRR Lungs: Bibasilar crackles, otherwise good air movement throughout, and no evidence of respiratory distress, on room air Skin: warm and dry Extremities: 1+ pitting edema of left foot, no pitting edema bilateral legs   Results for orders placed or performed during the hospital encounter of 12/23/18 (from the past 72 hour(s))  Basic metabolic panel     Status: Abnormal   Collection Time: 12/23/18  6:53 PM  Result Value Ref Range   Sodium 138 135 - 145 mmol/L   Potassium 3.4 (L) 3.5 - 5.1 mmol/L   Chloride 99 98 - 111 mmol/L   CO2 23 22 - 32 mmol/L   Glucose, Bld 169 (H) 70 - 99 mg/dL   BUN 77 (H) 8 - 23 mg/dL   Creatinine, Ser 2.34 (H) 0.44 - 1.00 mg/dL   Calcium 9.7 8.9 - 10.3 mg/dL   GFR calc non Af Amer 18 (L) >60 mL/min   GFR calc Af Amer 21 (L) >60 mL/min   Anion gap 16 (H) 5 - 15    Comment: Performed at [facility], [address], [place], [place] [address]  CBC     Status: Abnormal   Collection Time: 12/23/18  6:53 PM  Result Value Ref Range   WBC 8.3 4.0 - 10.5 K/uL   RBC 3.52 (L) 3.87 - 5.11 MIL/uL   Hemoglobin 9.9 (L) 12.0 - 15.0 g/dL   HCT 31.2 (L) 36.0 - 46.0 %   MCV 88.6 80.0 - 100.0 fL   MCH 28.1 26.0 - 34.0 pg   MCHC 31.7 30.0 - 36.0 g/dL   RDW 15.9 (H) 11.5 - 15.5 %   Platelets 171 150 - 400 K/uL   nRBC 0.0 0.0 - 0.2 %    Comment: Performed at [facility] [address], [place], [place] [address]  Brain natriuretic peptide     Status: Abnormal   Collection Time: 12/23/18  6:54 PM  Result Value Ref Range   B Natriuretic Peptide 1,158.0 (H) 0.0 - 100.0 pg/mL    Comment: Performed at [facility] [address], [place], [place] [address]    Assessment/Plan:  Acute combined systolic and diastolic heart failure ([place]) Appears to be now improved with change in diuretic therapy.  Discussed case with Dr. Erin Hearing, patient's PCP, who agreed the patient to stay on  current metolazone and Lasix dosing and hold off on Bumex that she is doing well with this and having improvement.  Advised that if she has worsening to call or go back to the ED.  Advised to follow-up with Dr. Erin Hearing as scheduled.  Dr. Erin Hearing notes that he will reach out to Dr. Einar Gip as they are in close contact about this patient.     PATIENT EDUCATION PROVIDED: See AVS    Diagnosis and plan along with any newly prescribed medication(s) were discussed in detail with this patient today. The patient verbalized understanding and agreed with the plan. Patient advised if symptoms worsen return to clinic or ER.     No orders of the defined types were placed in this encounter.   No orders of the defined types were placed in this encounter.    [place] Constable, DO 12/26/2018, 5:53 PM PGY-2 [facility]

## 2018-12-26 NOTE — Assessment & Plan Note (Signed)
Appears to be now improved with change in diuretic therapy.  Discussed case with Dr. Erin Hearing, patient's PCP, who agreed the patient to stay on current metolazone and Lasix dosing and hold off on Bumex that she is doing well with this and having improvement.  Advised that if she has worsening to call or go back to the ED.  Advised to follow-up with Dr. Erin Hearing as scheduled.  Dr. Erin Hearing notes that he will reach out to Dr. Einar Gip as they are in close contact about this patient.

## 2018-12-26 NOTE — Patient Instructions (Signed)
Thank you for coming to see me today. It was a pleasure. Today we talked about:   Your swelling:  You look improved.  Continue taking the Lasix and Metolazone once a day as you are currently doing.  Dr. Erin Hearing will reach out to Dr. Einar Gip and inform him that we are continuing to treat with Lasix and Metolazone and not Bumex.  If you have any questions or concerns, please do not hesitate to call the office at [phone].  Best,   [place] Constable, DO

## 2019-01-02 ENCOUNTER — Other Ambulatory Visit: Payer: Self-pay | Admitting: Family Medicine

## 2019-01-16 ENCOUNTER — Telehealth: Payer: Self-pay

## 2019-01-16 NOTE — Telephone Encounter (Signed)
Daughter calling patients bp is 80/44 and has been that way all day. She states that patient can not go to hospital and wait in the ED to be seen. She is to weak. Daughter needs to know what to do. ASAP  Dr Einar Gip said for patient to rest at home and if BP does not improve and she is feeling weak in 2-3 hours he advises she go to ED.

## 2019-01-21 ENCOUNTER — Other Ambulatory Visit: Payer: Self-pay

## 2019-01-21 ENCOUNTER — Telehealth (INDEPENDENT_AMBULATORY_CARE_PROVIDER_SITE_OTHER): Payer: Medicare Other | Admitting: Family Medicine

## 2019-01-21 DIAGNOSIS — I5042 Chronic combined systolic (congestive) and diastolic (congestive) heart failure: Secondary | ICD-10-CM

## 2019-01-21 DIAGNOSIS — R2681 Unsteadiness on feet: Secondary | ICD-10-CM

## 2019-01-21 DIAGNOSIS — W19XXXA Unspecified fall, initial encounter: Secondary | ICD-10-CM | POA: Insufficient documentation

## 2019-01-21 DIAGNOSIS — G2581 Restless legs syndrome: Secondary | ICD-10-CM

## 2019-01-21 MED ORDER — GABAPENTIN 100 MG PO CAPS: ORAL_CAPSULE | ORAL | 1 refills | Status: DC

## 2019-01-21 NOTE — Assessment & Plan Note (Signed)
With frequent falls.  Multifactorial including dehydration with diuretics, heart failure, balance issues and age.  Patient really wants to live alone but family is very afraid.  Discussed trying the gabapentin for a week to see how she is tolerating at her dtrs house.  May need to discuss stopping anticoagulation although a stroke would certainly end her independence

## 2019-01-21 NOTE — Assessment & Plan Note (Signed)
Seems stable on current regimen trying to balance fluid control with hypotension

## 2019-01-21 NOTE — Progress Notes (Signed)
[facility] Telemedicine Visit  Patient consented to have virtual visit. Method of visit: Video  Encounter participants: Patient: Jodi [name] - located at Daughter home Provider: [name] - located at office Others (if applicable): Daughter [name]  Chief Complaint: Painful restless legs  HPI:  LEGS For months has had hard to describe discomfort in both legs especially at night.  Has tried pain medication and capsacin cream but does not help.  Tried a relatives gabapentin which did help.  No sores or redness.  Edema is well controlled according to daughter  FALLS Feel at home alone last week now living with her daughter.  She really wants to go back home to live alone.  Dtrs do not want her to  CHF  Her blood pressure has been in the 90s but does not feel lightheadness when stands.  Mild swelling in L leg which is usual.  No particular shortness of breath   ROS: per HPI  Pertinent PMHx: End stage CHF  Exam:  Respiratory: normal conversation pattern Seems to know me and her situation Has bruises on both legs with no visible edema   Assessment/Plan:  Chronic combined systolic and diastolic congestive heart failure (HCC) Seems stable on current regimen trying to balance fluid control with hypotension   Restless legs Unsure if this is pure restless legs as has some symptoms during the day and seems to be more discomfort that is not relieved with movement.  Maybe a neuropathy.  Since gabapentin seems to help will start with low dose and titrate.  We discussed the risks of sedation and falls.  Daughter feels she could use the antianxiety properties.  Given her end stage CHF will not do more work up if can control   Gait instability With frequent falls.  Multifactorial including dehydration with diuretics, heart failure, balance issues and age.  Patient really wants to live alone but family is very afraid.  Discussed trying the gabapentin  for a week to see how she is tolerating at her dtrs house.  May need to discuss stopping anticoagulation although a stroke would certainly end her independence     Time spent during visit with patient: 18 minutes

## 2019-01-21 NOTE — Assessment & Plan Note (Signed)
Unsure if this is pure restless legs as has some symptoms during the day and seems to be more discomfort that is not relieved with movement.  Maybe a neuropathy.  Since gabapentin seems to help will start with low dose and titrate.  We discussed the risks of sedation and falls.  Daughter feels she could use the antianxiety properties.  Given her end stage CHF will not do more work up if can control

## 2019-01-22 ENCOUNTER — Telehealth: Payer: Self-pay | Admitting: *Deleted

## 2019-01-22 NOTE — Telephone Encounter (Signed)
Angel calls and request that Dr. Erin Hearing call and revisit what they spoke about the other day with pt.   Christen Bame, CMA

## 2019-01-23 NOTE — Telephone Encounter (Signed)
Called got answering machine - left message I called

## 2019-01-23 NOTE — Telephone Encounter (Signed)
Spoke with Jodi [name] and Daughter.  Recommend not live alone if she wants to live as long and independently as possible but ultimately it was her decision

## 2019-01-23 NOTE — Telephone Encounter (Signed)
Called left another VM  Left word at her dtrs home that I called.  She was not in

## 2019-02-03 ENCOUNTER — Emergency Department (HOSPITAL_COMMUNITY)
Admission: EM | Admit: 2019-02-03 | Discharge: 2019-02-03 | Disposition: A | Discharge status: Home / Self Care | Payer: Medicare Other | Attending: Emergency Medicine | Admitting: Emergency Medicine

## 2019-02-03 ENCOUNTER — Encounter (HOSPITAL_COMMUNITY): Payer: Self-pay | Admitting: Emergency Medicine

## 2019-02-03 ENCOUNTER — Other Ambulatory Visit: Payer: Self-pay

## 2019-02-03 DIAGNOSIS — I130 Hypertensive heart and chronic kidney disease with heart failure and stage 1 through stage 4 chronic kidney disease, or unspecified chronic kidney disease: Secondary | ICD-10-CM | POA: Diagnosis not present

## 2019-02-03 DIAGNOSIS — Z85528 Personal history of other malignant neoplasm of kidney: Secondary | ICD-10-CM | POA: Diagnosis not present

## 2019-02-03 DIAGNOSIS — Z743 Need for continuous supervision: Secondary | ICD-10-CM | POA: Diagnosis not present

## 2019-02-03 DIAGNOSIS — I4891 Unspecified atrial fibrillation: Secondary | ICD-10-CM | POA: Diagnosis not present

## 2019-02-03 DIAGNOSIS — R531 Weakness: Secondary | ICD-10-CM | POA: Diagnosis present

## 2019-02-03 DIAGNOSIS — R001 Bradycardia, unspecified: Secondary | ICD-10-CM | POA: Diagnosis not present

## 2019-02-03 DIAGNOSIS — N183 Chronic kidney disease, stage 3 unspecified: Secondary | ICD-10-CM | POA: Diagnosis not present

## 2019-02-03 DIAGNOSIS — R112 Nausea with vomiting, unspecified: Secondary | ICD-10-CM | POA: Insufficient documentation

## 2019-02-03 DIAGNOSIS — G2581 Restless legs syndrome: Secondary | ICD-10-CM | POA: Insufficient documentation

## 2019-02-03 DIAGNOSIS — Z79899 Other long term (current) drug therapy: Secondary | ICD-10-CM | POA: Insufficient documentation

## 2019-02-03 DIAGNOSIS — I5043 Acute on chronic combined systolic (congestive) and diastolic (congestive) heart failure: Secondary | ICD-10-CM | POA: Diagnosis not present

## 2019-02-03 DIAGNOSIS — R5381 Other malaise: Secondary | ICD-10-CM | POA: Diagnosis not present

## 2019-02-03 DIAGNOSIS — R52 Pain, unspecified: Secondary | ICD-10-CM | POA: Diagnosis not present

## 2019-02-03 DIAGNOSIS — I959 Hypotension, unspecified: Secondary | ICD-10-CM | POA: Diagnosis not present

## 2019-02-03 LAB — BASIC METABOLIC PANEL
Anion gap: 14 (ref 5–15)
BUN: 96 mg/dL — ABNORMAL HIGH (ref 8–23)
CO2: 27 mmol/L (ref 22–32)
Calcium: 10.3 mg/dL (ref 8.9–10.3)
Chloride: 96 mmol/L — ABNORMAL LOW (ref 98–111)
Creatinine, Ser: 2.46 mg/dL — ABNORMAL HIGH (ref 0.44–1.00)
GFR calc Af Amer: 20 mL/min — ABNORMAL LOW (ref >60)
GFR calc non Af Amer: 17 mL/min — ABNORMAL LOW (ref >60)
Glucose, Bld: 141 mg/dL — ABNORMAL HIGH (ref 70–99)
Potassium: 3.3 mmol/L — ABNORMAL LOW (ref 3.5–5.1)
Sodium: 137 mmol/L (ref 135–145)

## 2019-02-03 LAB — CBC WITH DIFFERENTIAL/PLATELET
Abs Immature Granulocytes: 0.03 10*3/uL (ref 0.00–0.07)
Basophils Absolute: 0.1 10*3/uL (ref 0.0–0.1)
Basophils Relative: 1 %
Eosinophils Absolute: 0 10*3/uL (ref 0.0–0.5)
Eosinophils Relative: 0 %
HCT: 29 % — ABNORMAL LOW (ref 36.0–46.0)
Hemoglobin: 8.8 g/dL — ABNORMAL LOW (ref 12.0–15.0)
Immature Granulocytes: 0 %
Lymphocytes Relative: 21 %
Lymphs Abs: 1.8 10*3/uL (ref 0.7–4.0)
MCH: 26 pg (ref 26.0–34.0)
MCHC: 30.3 g/dL (ref 30.0–36.0)
MCV: 85.5 fL (ref 80.0–100.0)
Monocytes Absolute: 0.9 10*3/uL (ref 0.1–1.0)
Monocytes Relative: 10 %
Neutro Abs: 5.6 10*3/uL (ref 1.7–7.7)
Neutrophils Relative %: 68 %
Platelets: 196 10*3/uL (ref 150–400)
RBC: 3.39 MIL/uL — ABNORMAL LOW (ref 3.87–5.11)
RDW: 17.2 % — ABNORMAL HIGH (ref 11.5–15.5)
WBC: 8.3 10*3/uL (ref 4.0–10.5)
nRBC: 0 % (ref 0.0–0.2)

## 2019-02-03 MED ORDER — SODIUM CHLORIDE 0.9 % IV BOLUS
500.0000 mL | Freq: Once | INTRAVENOUS | Status: AC
  Administered 2019-02-03: 500 mL via INTRAVENOUS

## 2019-02-03 MED ORDER — ONDANSETRON HCL 4 MG/2ML IJ SOLN
4.0000 mg | Freq: Once | INTRAMUSCULAR | Status: AC
  Administered 2019-02-03: 4 mg via INTRAVENOUS
  Filled 2019-02-03: qty 2

## 2019-02-03 NOTE — Discharge Instructions (Addendum)
The testing today did not show any serious problems.  The gabapentin is likely contributing to your sleepiness.  This should improve after you are on it for a while.  Your kidney function and hemoglobin are both abnormal, but not substantially changed.  It is important to follow-up with your primary care doctor for further evaluation and treatment in a week or so.  Return here, if needed, for problems.

## 2019-02-03 NOTE — ED Provider Notes (Signed)
[facility] EMERGENCY DEPARTMENT Provider Note   CSN: [id number] Arrival date & time: 02/03/19  1552     History Chief Complaint  Patient presents with  . Weakness    Jodi [name] is a 85 y.o. female.  HPI She presents for generalized weakness, with achiness, and nausea and vomiting.  Transferred here by EMS.  Patient is a vague historian.  She states that her doctor recently prescribed gabapentin, and she took a single dose today.  Prior to that she recently took one of her daughters gabapentin's which was 400 mg.  She said that this medicine made her very sleepy and groggy even through the next day.  She denies fever, chills, cough, shortness of breath, chest pain, focal weakness or paresthesia.  No recent illnesses.  No known sick contacts.  There are no other known modifying factors.    Past Medical History:  Diagnosis Date  . Aortic stenosis   . Arthritis    "maybe in my fingers and toes" (09/28/2014)  . Bradycardia   . CHF (congestive heart failure) ([place])   . Chronic renal insufficiency, stage III (moderate)    [name] 09/27/2014  . Coronary artery disease   . Heart murmur   . Hyperlipidemia   . Hypertension   . Macular degeneration, left eye   . Myocardial infarction [facility]) 1995; 2003  . Paroxysmal atrial fibrillation (HCC)   . Renal cell carcinoma ([place]) 09/23/2012  . Renal mass 12/06/2010   CT Abdomen 11-27-10 upper pole region of the right kidney which is suspicious for solid lesion, measuring 1.9 x 1.3 cm. This lesion is concerning for renal cell carcinoma given the solid appearance.  Following with Dr [name].  Renal bx was benign    . Shortness of breath   . Splenic infarct 12/06/2010  . Stroke [facility]) 2003   "when I had heart surgery"; denies residual on 09/28/2014  . TIA (transient ischemic attack) "several"    Patient Active Problem List   Diagnosis Date Noted  . Gait instability 01/21/2019  . Restless legs 12/17/2018  . Acute on  chronic combined systolic and diastolic congestive heart failure ([place]) 08/26/2018  . Recurrent UTI 01/27/2018  . Acute on chronic diastolic (congestive) heart failure ([place]) 12/04/2016  . Thyroid nodule 08/21/2016  . Hyperglycemia 11/09/2015  . Chronic combined systolic and diastolic congestive heart failure ([place]) 03/04/2015  . Left renal mass   . Paroxysmal atrial fibrillation ([place]) 08/20/2013  . Renal cell carcinoma ([place]) 09/23/2012  . Chronic kidney disease (CKD), stage III (moderate) ([place]) 02/20/2012  . Aortic stenosis, severe 12/06/2010  . SEBORRHEA 11/10/2007  . Insomnia 06/09/2007  . HYPERCHOLESTEROLEMIA 04/04/2006  . HYPERTENSION, BENIGN SYSTEMIC 04/04/2006  . CORONARY, ARTERIOSCLEROSIS 04/04/2006  . Acute combined systolic and diastolic heart failure ([place]) 04/04/2006  . Osteoarthritis involving multiple joints on both sides of body 04/04/2006    Past Surgical History:  Procedure Laterality Date  . CARDIAC CATHETERIZATION    . CARDIOVERSION N/A 10/20/2013   Procedure: CARDIOVERSION;  Surgeon: Laverda Page, MD;  Location: [facility];  Service: Cardiovascular;  Laterality: N/A;  H&P in file  . CARDIOVERSION N/A 05/18/2014   Procedure: CARDIOVERSION;  Surgeon: [name], MD;  Location: [facility] ENDOSCOPY;  Service: Cardiovascular;  Laterality: N/A;  . CARDIOVERSION N/A 10/31/2016   Procedure: CARDIOVERSION;  Surgeon: [name], MD;  Location: [facility] ENDOSCOPY;  Service: Cardiovascular;  Laterality: N/A;  . CARDIOVERSION N/A 11/20/2016   Procedure: CARDIOVERSION;  Surgeon: [name], MD;  Location:  [facility] ENDOSCOPY;  Service: Cardiovascular;  Laterality: N/A;  . CATARACT EXTRACTION W/ INTRAOCULAR LENS  IMPLANT, BILATERAL Bilateral ~ 2013  . CORONARY ANGIOPLASTY WITH STENT PLACEMENT  2002  . CORONARY ARTERY BYPASS GRAFT  1995 and 2003  . IR RADIOLOGIST EVAL & MGMT  07/03/2016  . LEFT AND RIGHT HEART CATHETERIZATION WITH CORONARY ANGIOGRAM N/A 11/24/2013   Procedure: LEFT AND RIGHT HEART  CATHETERIZATION WITH CORONARY ANGIOGRAM;  Surgeon: Laverda Page, MD;  Location: [facility] CATH LAB;  Service: Cardiovascular;  Laterality: N/A;  . PERCUTANEOUS NEEDLE BIOPSY OF RENAL LESION  ?2014  . TOTAL ABDOMINAL HYSTERECTOMY  1990's   "both ovaries were full of little tiny sores"  . TYMPANOPLASTY Bilateral    "had holes in them; still have holes in them"     OB History   No obstetric history on file.     Family History  Problem Relation Age of Onset  . Coronary artery disease Mother   . Stroke Mother   . Leukemia Father   . Leukemia Sister   . Cancer Brother   . Coronary artery disease Brother   . Heart attack Brother   . Heart attack Son   . Heart attack Daughter   . Myelodysplastic syndrome Sister     Social History   Tobacco Use  . Smoking status: Never Smoker  . Smokeless tobacco: Never Used  Substance Use Topics  . Alcohol use: No  . Drug use: No    Home Medications Prior to Admission medications   Medication Sig Start Date End Date Taking? Authorizing Provider  BIDIL 20-37.5 MG tablet TAKE 2 TABLETS BY MOUTH 3 TIMES A DAY Patient taking differently: Take 2 tablets by mouth 3 (three) times daily.  06/16/18   [name], MD  bumetanide (BUMEX) 2 MG tablet Take 1 tablet (2 mg total) by mouth 2 (two) times daily at 10 am and 4 pm. 12/25/18   [name], MD  capsaicin (ZOSTRIX) 0.025 % cream Apply topically at bedtime. 12/17/18   [name], MD  Cholecalciferol (VITAMIN D3) 2000 UNITS TABS Take 2,000 Units by mouth daily.     [provider]  ELIQUIS 2.5 MG TABS tablet TAKE 1 TABLET BY MOUTH 2 TIMES DAILY. 01/05/19   [name], MD  gabapentin (NEURONTIN) 100 MG capsule Use 2-5 caps at bedtime as needed 01/21/19   [name], MD  metolazone (ZAROXOLYN) 5 MG tablet Take 1 tablet (5 mg total) by mouth daily at 2 PM. 12/12/18   [name], MD  metoprolol tartrate (LOPRESSOR) 25 MG tablet TAKE 1/2 TABLET BY MOUTH 2 TIMES A DAY.  10/31/18   [name], MD  Multiple Vitamin (MULTIVITAMIN WITH MINERALS) TABS tablet Take 1 tablet by mouth daily.    [provider]  polyethylene glycol powder (GLYCOLAX/MIRALAX) 17 GM/SCOOP powder TAKE 17 GRAMS (1 CAPFUL) BY MOUTH 2 TIMES DAILY. 10/31/18   Chambliss, Jeb Levering, MD  PROAIR HFA 108 (90 Base) MCG/ACT inhaler INHALE 1 PUFF INTO THE LUNGS EVERY 6 HOURS AS NEEDED FOR WHEEZING OR SHORTNESS OF BREATH. Patient taking differently: Inhale 1 puff into the lungs every 6 (six) hours as needed for wheezing or shortness of breath.  08/04/18   Chambliss, Jeb Levering, MD  rosuvastatin (CRESTOR) 20 MG tablet TAKE 1 TABLET BY MOUTH DAILY Patient taking differently: Take 20 mg by mouth daily.  04/22/18   [name], MD  spironolactone (ALDACTONE) 25 MG tablet Take 0.5 tablets (12.5 mg total)  by mouth daily. Patient not taking: Reported on 12/17/2018 09/16/18   Miquel Dunn, NP  Turmeric 500 MG CAPS Take 500 mg by mouth daily. 1200    [provider]  vitamin B-12 (CYANOCOBALAMIN) 1000 MCG tablet Take 1,000 mcg by mouth daily.    [provider]  zolpidem (AMBIEN) 10 MG tablet Take 5 mg by mouth at bedtime as needed for sleep.    [provider]    Allergies    Sulfamethoxazole-trimethoprim and Tape  Review of Systems   Review of Systems  All other systems reviewed and are negative.   Physical Exam Updated Vital Signs BP 137/63   Pulse 67   Temp 97.7 F (36.5 C) (Rectal)   Resp 12   SpO2 91%   Physical Exam Vitals and nursing note reviewed.  Constitutional:      Appearance: She is well-developed.     Comments: Elderly, frail  HENT:     Head: Normocephalic and atraumatic.     Right Ear: External ear normal.     Left Ear: External ear normal.  Eyes:     Conjunctiva/sclera: Conjunctivae normal.     Pupils: Pupils are equal, round, and reactive to light.  Neck:     Trachea: Phonation normal.  Cardiovascular:      Rate and Rhythm: Normal rate and regular rhythm.     Heart sounds: Normal heart sounds.  Pulmonary:     Effort: Pulmonary effort is normal.     Breath sounds: Normal breath sounds.  Abdominal:     General: There is no distension.     Palpations: Abdomen is soft.     Tenderness: There is no abdominal tenderness.  Musculoskeletal:        General: No swelling, tenderness or deformity. Normal range of motion.     Cervical back: Normal range of motion and neck supple.     Right lower leg: No edema.     Left lower leg: No edema.  Skin:    General: Skin is warm and dry.  Neurological:     Mental Status: She is alert and oriented to person, place, and time.     Cranial Nerves: No cranial nerve deficit.     Sensory: No sensory deficit.     Motor: No abnormal muscle tone.     Coordination: Coordination normal.  Psychiatric:        Mood and Affect: Mood normal.        Behavior: Behavior normal.        Thought Content: Thought content normal.        Judgment: Judgment normal.     ED Results / Procedures / Treatments   Labs (all labs ordered are listed, but only abnormal results are displayed) Labs Reviewed  BASIC METABOLIC PANEL - Abnormal; Notable for the following components:      Result Value   Potassium 3.3 (*)    Chloride 96 (*)    Glucose, Bld 141 (*)    BUN 96 (*)    Creatinine, Ser 2.46 (*)    GFR calc non Af Amer 17 (*)    GFR calc Af Amer 20 (*)    All other components within normal limits  CBC WITH DIFFERENTIAL/PLATELET - Abnormal; Notable for the following components:   RBC 3.39 (*)    Hemoglobin 8.8 (*)    HCT 29.0 (*)    RDW 17.2 (*)    All other components within normal limits    EKG  EKG Interpretation  Date/Time:  Tuesday February 03 2019 15:59:41 EST Ventricular Rate:  76 PR Interval:    QRS Duration: 133 QT Interval:  469 QTC Calculation: 528 R Axis:   21 Text Interpretation: Atrial fibrillation Ventricular premature complex Nonspecific  intraventricular conduction delay Anterolateral infarct, age indeterminate since last tracing no significant change Confirmed by Daleen Bo [phone]) on 02/03/2019 4:29:08 PM   Radiology No results found.  Procedures Procedures (including critical care time)  Medications Ordered in ED Medications  ondansetron (ZOFRAN) injection 4 mg (4 mg Intravenous Given 02/03/19 1707)  sodium chloride 0.9 % bolus 500 mL (0 mLs Intravenous Stopped 02/03/19 1910)    ED Course  I have reviewed the triage vital signs and the nursing notes.  Pertinent labs & imaging results that were available during my care of the patient were reviewed by me and considered in my medical decision making (see chart for details).  Clinical Course as of Feb 03 2055  Tue Feb 03, 2019  2045 Normal except potassium low, chloride low, glucose high, BUN high, creatinine high, GFR low  Basic metabolic panel(!) [EW]  6045 Normal except hemoglobin low  CBC with Differential(!) [EW]  2053 I was able to reach the patient's listed contact, her son Legrand Como.  We discussed the findings and recommendations.  He understands and agrees.  He will have the patient's daughter [name], come and pick her up.   [EW]    Clinical Course User Index [EW] Daleen Bo, MD   MDM Rules/Calculators/A&P                       Patient Vitals for the past 24 hrs:  BP Temp Temp src Pulse Resp SpO2  02/03/19 1900 137/63 -- -- 67 12 91 %  02/03/19 1845 129/67 -- -- 66 (!) 23 94 %  02/03/19 1815 (!) 121/56 -- -- (!) 56 14 93 %  02/03/19 1800 139/64 -- -- (!) 53 20 94 %  02/03/19 1736 -- 97.7 F (36.5 C) Rectal -- -- --  02/03/19 1735 -- 97.7 F (36.5 C) Axillary 64 20 96 %  02/03/19 1730 128/61 -- -- (!) 113 13 94 %  02/03/19 1715 (!) 139/59 -- -- 77 14 (!) 85 %  02/03/19 1700 (!) 141/57 -- -- 62 14 98 %  02/03/19 1645 (!) 140/51 -- -- (!) 52 13 96 %  02/03/19 1630 (!) 130/50 -- -- (!) 59 19 97 %  02/03/19 1615 133/71 -- -- 60 12 97 %   02/03/19 1604 (!) 142/82 97.8 F (36.6 C) Oral (!) 43 20 97 %  02/03/19 1600 (!) 142/82 -- -- 60 20 96 %    8:46 PM Reevaluation with update and discussion. After initial assessment and treatment, an updated evaluation reveals patient states she is sore from lying in bed.  She has to sit up and this helps to do that.  I discussed the findings with her.  She states that her daughter will come and get her. Daleen Bo   Medical Decision Making: Jodi [name], with difficulty sleeping due to restless leg syndrome.  Patient has been started on gabapentin recently, 4 days ago, and likely somewhat sedated because of it.  I suspect that she also is not sleeping well contributing to her sleepiness.  There is no evidence for acute infection, or significant change in her chronic illnesses.  She has chronic renal insufficiency and chronic anemia.  Doubt acute volume depletion, serious bacterial infection or  impending vascular collapse.  Jodi [name] was evaluated in Emergency Department on 02/03/2019 for the symptoms described in the history of present illness. She was evaluated in the context of the global COVID-19 pandemic, which necessitated consideration that the patient might be at risk for infection with the SARS-CoV-2 virus that causes COVID-19. Institutional protocols and algorithms that pertain to the evaluation of patients at risk for COVID-19 are in a state of rapid change based on information released by regulatory bodies including the CDC and federal and state organizations. These policies and algorithms were followed during the patient's care in the ED.   CRITICAL CARE-no Performed by: Daleen Bo   Nursing Notes Reviewed/ Care Coordinated Applicable Imaging Reviewed Interpretation of Laboratory Data incorporated into ED treatment  The patient appears reasonably screened and/or stabilized for discharge and I doubt any other medical condition or other [facility] requiring further screening,  evaluation, or treatment in the ED at this time prior to discharge.  Plan: Home Medications-continue usual; Home Treatments-increase oral fluid intake; return here if the recommended treatment, does not improve the symptoms; Recommended follow up-PCP follow-up 1 week and as needed       Final Clinical Impression(s) / ED Diagnoses Final diagnoses:  Malaise  Restless leg syndrome    Rx / DC Orders ED Discharge Orders    None       Daleen Bo, MD 02/03/19 2056

## 2019-02-03 NOTE — ED Notes (Signed)
Pt voided on self. Pt cleaned, linens changed. Purewick placed on pt.

## 2019-02-03 NOTE — ED Notes (Signed)
Nurse Navigator called and spoke with pt daughter [name].  Daughter states that she is concerned about pt living on her own, she is concerned that pt is not taking her medications as needed and as prescribed.

## 2019-02-03 NOTE — ED Notes (Signed)
Pt urinated in the bed so we applied a purwick so we can catch her urine.

## 2019-02-03 NOTE — ED Triage Notes (Signed)
Pt here from home with increased weakness n/v and joint pain. Denies fever and sickness. Bilateral leg pain that pt states is just restless legs. Hx of afib. Lethargic with ems.

## 2019-02-03 NOTE — ED Notes (Addendum)
[name], her daughter is going to pick her up when she is discharged [phone]

## 2019-02-09 ENCOUNTER — Other Ambulatory Visit: Payer: Self-pay

## 2019-02-09 ENCOUNTER — Ambulatory Visit: Payer: Medicare Other | Admitting: Cardiology

## 2019-02-09 ENCOUNTER — Telehealth: Payer: Self-pay | Admitting: *Deleted

## 2019-02-09 DIAGNOSIS — I5042 Chronic combined systolic (congestive) and diastolic (congestive) heart failure: Secondary | ICD-10-CM

## 2019-02-09 MED ORDER — SPIRONOLACTONE 25 MG PO TABS: 12.5000 mg | ORAL_TABLET | Freq: Every day | ORAL | 3 refills | Status: DC

## 2019-02-09 NOTE — Telephone Encounter (Signed)
Patient's daughter [name] contacted palliative care and feels that patient would be a good fit for this service.  Janeal Holmes [phone]) with palliative care called to let provider know the family was interested.  If provider agrees and would like to go out and assess the patient, a verbal order is all that is needed.  PCP can place this order in epic also, if he prefer. Tila Millirons,CMA

## 2019-02-10 NOTE — Telephone Encounter (Signed)
Please call in verbal order for palliative care services  Thanks  [facility]

## 2019-02-10 NOTE — Telephone Encounter (Signed)
Called [facility] and gave verbal orders for palliative care per Dr. Erin Hearing.  Ozella Almond, [place]

## 2019-02-11 ENCOUNTER — Encounter: Payer: Self-pay | Admitting: Cardiology

## 2019-02-11 ENCOUNTER — Other Ambulatory Visit: Payer: Self-pay

## 2019-02-11 ENCOUNTER — Ambulatory Visit (INDEPENDENT_AMBULATORY_CARE_PROVIDER_SITE_OTHER): Payer: Medicare Other | Admitting: Cardiology

## 2019-02-11 VITALS — BP 137/57 | HR 54 | Temp 97.5°F | Ht 63.0 in | Wt 157.9 lb

## 2019-02-11 DIAGNOSIS — Z66 Do not resuscitate: Secondary | ICD-10-CM | POA: Diagnosis not present

## 2019-02-11 DIAGNOSIS — N184 Chronic kidney disease, stage 4 (severe): Secondary | ICD-10-CM | POA: Diagnosis not present

## 2019-02-11 DIAGNOSIS — I4821 Permanent atrial fibrillation: Secondary | ICD-10-CM

## 2019-02-11 DIAGNOSIS — I5042 Chronic combined systolic (congestive) and diastolic (congestive) heart failure: Secondary | ICD-10-CM | POA: Diagnosis not present
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
## 2019-02-13 ENCOUNTER — Telehealth: Payer: Self-pay | Admitting: Internal Medicine

## 2019-02-14 DIAGNOSIS — I4891 Unspecified atrial fibrillation: Secondary | ICD-10-CM | POA: Diagnosis not present

## 2019-02-14 DIAGNOSIS — I499 Cardiac arrhythmia, unspecified: Secondary | ICD-10-CM | POA: Diagnosis not present

## 2019-02-14 DIAGNOSIS — Z743 Need for continuous supervision: Secondary | ICD-10-CM | POA: Diagnosis not present

## 2019-02-14 DIAGNOSIS — I491 Atrial premature depolarization: Secondary | ICD-10-CM | POA: Diagnosis not present

## 2019-02-14 DIAGNOSIS — R404 Transient alteration of awareness: Secondary | ICD-10-CM | POA: Diagnosis not present

## 2019-02-15 ENCOUNTER — Other Ambulatory Visit: Payer: Self-pay

## 2019-02-15 ENCOUNTER — Encounter (HOSPITAL_COMMUNITY): Payer: Self-pay | Admitting: Emergency Medicine

## 2019-02-15 ENCOUNTER — Inpatient Hospital Stay (HOSPITAL_COMMUNITY)
Admission: EM | Admit: 2019-02-15 | Discharge: 2019-02-20 | DRG: 291 | Disposition: A | Discharge status: Hospice | Payer: Medicare Other | Attending: Family Medicine | Admitting: Family Medicine

## 2019-02-15 ENCOUNTER — Inpatient Hospital Stay (HOSPITAL_COMMUNITY): Payer: Medicare Other

## 2019-02-15 ENCOUNTER — Emergency Department (HOSPITAL_COMMUNITY): Payer: Medicare Other

## 2019-02-15 DIAGNOSIS — R188 Other ascites: Secondary | ICD-10-CM | POA: Diagnosis present

## 2019-02-15 DIAGNOSIS — I4821 Permanent atrial fibrillation: Secondary | ICD-10-CM | POA: Diagnosis present

## 2019-02-15 DIAGNOSIS — I5043 Acute on chronic combined systolic (congestive) and diastolic (congestive) heart failure: Secondary | ICD-10-CM | POA: Diagnosis present

## 2019-02-15 DIAGNOSIS — N289 Disorder of kidney and ureter, unspecified: Secondary | ICD-10-CM | POA: Diagnosis not present

## 2019-02-15 DIAGNOSIS — E7800 Pure hypercholesterolemia, unspecified: Secondary | ICD-10-CM | POA: Diagnosis present

## 2019-02-15 DIAGNOSIS — D649 Anemia, unspecified: Secondary | ICD-10-CM | POA: Diagnosis not present

## 2019-02-15 DIAGNOSIS — Z992 Dependence on renal dialysis: Secondary | ICD-10-CM

## 2019-02-15 DIAGNOSIS — G4700 Insomnia, unspecified: Secondary | ICD-10-CM | POA: Diagnosis present

## 2019-02-15 DIAGNOSIS — N179 Acute kidney failure, unspecified: Secondary | ICD-10-CM | POA: Diagnosis present

## 2019-02-15 DIAGNOSIS — N172 Acute kidney failure with medullary necrosis: Secondary | ICD-10-CM | POA: Diagnosis not present

## 2019-02-15 DIAGNOSIS — R0600 Dyspnea, unspecified: Secondary | ICD-10-CM

## 2019-02-15 DIAGNOSIS — N184 Chronic kidney disease, stage 4 (severe): Secondary | ICD-10-CM | POA: Diagnosis present

## 2019-02-15 DIAGNOSIS — I130 Hypertensive heart and chronic kidney disease with heart failure and stage 1 through stage 4 chronic kidney disease, or unspecified chronic kidney disease: Principal | ICD-10-CM | POA: Diagnosis present

## 2019-02-15 DIAGNOSIS — R140 Abdominal distension (gaseous): Secondary | ICD-10-CM | POA: Diagnosis not present

## 2019-02-15 DIAGNOSIS — N183 Chronic kidney disease, stage 3 unspecified: Secondary | ICD-10-CM | POA: Diagnosis not present

## 2019-02-15 DIAGNOSIS — D509 Iron deficiency anemia, unspecified: Secondary | ICD-10-CM | POA: Diagnosis present

## 2019-02-15 DIAGNOSIS — K5900 Constipation, unspecified: Secondary | ICD-10-CM | POA: Diagnosis present

## 2019-02-15 DIAGNOSIS — Z515 Encounter for palliative care: Secondary | ICD-10-CM | POA: Diagnosis present

## 2019-02-15 DIAGNOSIS — S0990XA Unspecified injury of head, initial encounter: Secondary | ICD-10-CM | POA: Diagnosis not present

## 2019-02-15 DIAGNOSIS — N170 Acute kidney failure with tubular necrosis: Secondary | ICD-10-CM | POA: Diagnosis not present

## 2019-02-15 DIAGNOSIS — M1990 Unspecified osteoarthritis, unspecified site: Secondary | ICD-10-CM | POA: Diagnosis present

## 2019-02-15 DIAGNOSIS — Z7901 Long term (current) use of anticoagulants: Secondary | ICD-10-CM | POA: Diagnosis not present

## 2019-02-15 DIAGNOSIS — F411 Generalized anxiety disorder: Secondary | ICD-10-CM | POA: Diagnosis not present

## 2019-02-15 DIAGNOSIS — Z7189 Other specified counseling: Secondary | ICD-10-CM | POA: Diagnosis not present

## 2019-02-15 DIAGNOSIS — Z9111 Patient's noncompliance with dietary regimen: Secondary | ICD-10-CM

## 2019-02-15 DIAGNOSIS — D638 Anemia in other chronic diseases classified elsewhere: Secondary | ICD-10-CM | POA: Diagnosis present

## 2019-02-15 DIAGNOSIS — I2510 Atherosclerotic heart disease of native coronary artery without angina pectoris: Secondary | ICD-10-CM | POA: Diagnosis present

## 2019-02-15 DIAGNOSIS — Z955 Presence of coronary angioplasty implant and graft: Secondary | ICD-10-CM

## 2019-02-15 DIAGNOSIS — G2581 Restless legs syndrome: Secondary | ICD-10-CM | POA: Diagnosis present

## 2019-02-15 DIAGNOSIS — Z20822 Contact with and (suspected) exposure to covid-19: Secondary | ICD-10-CM | POA: Diagnosis present

## 2019-02-15 DIAGNOSIS — I5042 Chronic combined systolic (congestive) and diastolic (congestive) heart failure: Secondary | ICD-10-CM

## 2019-02-15 DIAGNOSIS — Z9842 Cataract extraction status, left eye: Secondary | ICD-10-CM

## 2019-02-15 DIAGNOSIS — N189 Chronic kidney disease, unspecified: Secondary | ICD-10-CM

## 2019-02-15 DIAGNOSIS — Z9841 Cataract extraction status, right eye: Secondary | ICD-10-CM

## 2019-02-15 DIAGNOSIS — R627 Adult failure to thrive: Secondary | ICD-10-CM | POA: Diagnosis present

## 2019-02-15 DIAGNOSIS — I350 Nonrheumatic aortic (valve) stenosis: Secondary | ICD-10-CM | POA: Diagnosis present

## 2019-02-15 DIAGNOSIS — I482 Chronic atrial fibrillation, unspecified: Secondary | ICD-10-CM

## 2019-02-15 DIAGNOSIS — I5082 Biventricular heart failure: Secondary | ICD-10-CM | POA: Diagnosis not present

## 2019-02-15 DIAGNOSIS — Z951 Presence of aortocoronary bypass graft: Secondary | ICD-10-CM

## 2019-02-15 DIAGNOSIS — I252 Old myocardial infarction: Secondary | ICD-10-CM | POA: Diagnosis not present

## 2019-02-15 DIAGNOSIS — Z66 Do not resuscitate: Secondary | ICD-10-CM | POA: Diagnosis present

## 2019-02-15 DIAGNOSIS — I129 Hypertensive chronic kidney disease with stage 1 through stage 4 chronic kidney disease, or unspecified chronic kidney disease: Secondary | ICD-10-CM | POA: Diagnosis not present

## 2019-02-15 DIAGNOSIS — G930 Cerebral cysts: Secondary | ICD-10-CM | POA: Diagnosis present

## 2019-02-15 DIAGNOSIS — I5040 Unspecified combined systolic (congestive) and diastolic (congestive) heart failure: Secondary | ICD-10-CM | POA: Diagnosis not present

## 2019-02-15 DIAGNOSIS — I509 Heart failure, unspecified: Secondary | ICD-10-CM

## 2019-02-15 DIAGNOSIS — R296 Repeated falls: Secondary | ICD-10-CM | POA: Diagnosis present

## 2019-02-15 DIAGNOSIS — Z79899 Other long term (current) drug therapy: Secondary | ICD-10-CM

## 2019-02-15 DIAGNOSIS — M25511 Pain in right shoulder: Secondary | ICD-10-CM | POA: Diagnosis present

## 2019-02-15 DIAGNOSIS — Z8673 Personal history of transient ischemic attack (TIA), and cerebral infarction without residual deficits: Secondary | ICD-10-CM

## 2019-02-15 DIAGNOSIS — N185 Chronic kidney disease, stage 5: Secondary | ICD-10-CM | POA: Diagnosis not present

## 2019-02-15 LAB — COMPREHENSIVE METABOLIC PANEL
ALT: 14 U/L (ref 0–44)
AST: 21 U/L (ref 15–41)
Albumin: 3.6 g/dL (ref 3.5–5.0)
Alkaline Phosphatase: 77 U/L (ref 38–126)
Anion gap: 14 (ref 5–15)
BUN: 115 mg/dL — ABNORMAL HIGH (ref 8–23)
CO2: 28 mmol/L (ref 22–32)
Calcium: 9 mg/dL (ref 8.9–10.3)
Chloride: 95 mmol/L — ABNORMAL LOW (ref 98–111)
Creatinine, Ser: 2.82 mg/dL — ABNORMAL HIGH (ref 0.44–1.00)
GFR calc Af Amer: 17 mL/min — ABNORMAL LOW (ref >60)
GFR calc non Af Amer: 15 mL/min — ABNORMAL LOW (ref >60)
Glucose, Bld: 121 mg/dL — ABNORMAL HIGH (ref 70–99)
Potassium: 4.3 mmol/L (ref 3.5–5.1)
Sodium: 137 mmol/L (ref 135–145)
Total Bilirubin: 1 mg/dL (ref 0.3–1.2)
Total Protein: 6.3 g/dL — ABNORMAL LOW (ref 6.5–8.1)

## 2019-02-15 LAB — URINALYSIS, ROUTINE W REFLEX MICROSCOPIC
Bilirubin Urine: NEGATIVE
Glucose, UA: NEGATIVE mg/dL
Hgb urine dipstick: NEGATIVE
Ketones, ur: NEGATIVE mg/dL
Nitrite: NEGATIVE
Protein, ur: NEGATIVE mg/dL
Specific Gravity, Urine: 1.011 (ref 1.005–1.030)
pH: 6 (ref 5.0–8.0)

## 2019-02-15 LAB — POC OCCULT BLOOD, ED: Fecal Occult Bld: NEGATIVE

## 2019-02-15 LAB — CBC WITH DIFFERENTIAL/PLATELET
Abs Immature Granulocytes: 0.03 10*3/uL (ref 0.00–0.07)
Basophils Absolute: 0.1 10*3/uL (ref 0.0–0.1)
Basophils Relative: 1 %
Eosinophils Absolute: 0.5 10*3/uL (ref 0.0–0.5)
Eosinophils Relative: 6 %
HCT: 27.4 % — ABNORMAL LOW (ref 36.0–46.0)
Hemoglobin: 8 g/dL — ABNORMAL LOW (ref 12.0–15.0)
Immature Granulocytes: 0 %
Lymphocytes Relative: 27 %
Lymphs Abs: 2.5 10*3/uL (ref 0.7–4.0)
MCH: 25.1 pg — ABNORMAL LOW (ref 26.0–34.0)
MCHC: 29.2 g/dL — ABNORMAL LOW (ref 30.0–36.0)
MCV: 85.9 fL (ref 80.0–100.0)
Monocytes Absolute: 1.2 10*3/uL — ABNORMAL HIGH (ref 0.1–1.0)
Monocytes Relative: 13 %
Neutro Abs: 5 10*3/uL (ref 1.7–7.7)
Neutrophils Relative %: 53 %
Platelets: 230 10*3/uL (ref 150–400)
RBC: 3.19 MIL/uL — ABNORMAL LOW (ref 3.87–5.11)
RDW: 17.7 % — ABNORMAL HIGH (ref 11.5–15.5)
WBC: 9.3 10*3/uL (ref 4.0–10.5)
nRBC: 0 % (ref 0.0–0.2)

## 2019-02-15 LAB — HEMOGLOBIN A1C
Hgb A1c MFr Bld: 6.4 % — ABNORMAL HIGH (ref 4.8–5.6)
Mean Plasma Glucose: 136.98 mg/dL

## 2019-02-15 LAB — CREATININE, URINE, RANDOM: Creatinine, Urine: 41.24 mg/dL

## 2019-02-15 LAB — LIPID PANEL
Cholesterol: 134 mg/dL (ref 0–200)
HDL: 38 mg/dL — ABNORMAL LOW (ref >40)
LDL Cholesterol: 79 mg/dL (ref 0–99)
Total CHOL/HDL Ratio: 3.5 RATIO
Triglycerides: 85 mg/dL (ref <150)
VLDL: 17 mg/dL (ref 0–40)

## 2019-02-15 LAB — ABO/RH: ABO/RH(D): B POS

## 2019-02-15 LAB — SARS CORONAVIRUS 2 (TAT 6-24 HRS): SARS Coronavirus 2: NEGATIVE

## 2019-02-15 LAB — TSH: TSH: 6.56 u[IU]/mL — ABNORMAL HIGH (ref 0.350–4.500)

## 2019-02-15 LAB — BRAIN NATRIURETIC PEPTIDE: B Natriuretic Peptide: 1319.8 pg/mL — ABNORMAL HIGH (ref 0.0–100.0)

## 2019-02-15 MED ORDER — ACETAMINOPHEN 325 MG PO TABS
650.0000 mg | ORAL_TABLET | Freq: Four times a day (QID) | ORAL | Status: DC | PRN
  Administered 2019-02-15 – 2019-02-17 (×3): 650 mg via ORAL
  Filled 2019-02-15 (×3): qty 2

## 2019-02-15 MED ORDER — GABAPENTIN 100 MG PO CAPS: 100.0000 mg | ORAL_CAPSULE | Freq: Every evening | ORAL | Status: DC | PRN

## 2019-02-15 MED ORDER — METOPROLOL TARTRATE 12.5 MG HALF TABLET
12.5000 mg | ORAL_TABLET | Freq: Two times a day (BID) | ORAL | Status: DC
  Administered 2019-02-16 – 2019-02-19 (×8): 12.5 mg via ORAL
  Filled 2019-02-15 (×9): qty 1

## 2019-02-15 MED ORDER — ISOSORB DINITRATE-HYDRALAZINE 20-37.5 MG PO TABS
2.0000 | ORAL_TABLET | Freq: Three times a day (TID) | ORAL | Status: DC
  Administered 2019-02-15 – 2019-02-16 (×2): 2 via ORAL
  Filled 2019-02-15 (×5): qty 2

## 2019-02-15 MED ORDER — POLYETHYLENE GLYCOL 3350 17 G PO PACK
17.0000 g | PACK | Freq: Every day | ORAL | Status: DC
  Administered 2019-02-16: 17 g via ORAL
  Filled 2019-02-15: qty 1

## 2019-02-15 MED ORDER — METOLAZONE 5 MG PO TABS
5.0000 mg | ORAL_TABLET | Freq: Every day | ORAL | Status: DC
  Administered 2019-02-15: 5 mg via ORAL
  Filled 2019-02-15: qty 1

## 2019-02-15 MED ORDER — APIXABAN 2.5 MG PO TABS
2.5000 mg | ORAL_TABLET | Freq: Two times a day (BID) | ORAL | Status: DC
  Administered 2019-02-15 – 2019-02-18 (×7): 2.5 mg via ORAL
  Filled 2019-02-15 (×8): qty 1

## 2019-02-15 MED ORDER — FUROSEMIDE 10 MG/ML IJ SOLN: 40.0000 mg | Freq: Once | INTRAMUSCULAR | Status: DC

## 2019-02-15 MED ORDER — ROSUVASTATIN CALCIUM 20 MG PO TABS
20.0000 mg | ORAL_TABLET | Freq: Every day | ORAL | Status: DC
  Administered 2019-02-15 – 2019-02-20 (×6): 20 mg via ORAL
  Filled 2019-02-15 (×6): qty 1

## 2019-02-15 MED ORDER — VITAMIN B-12 1000 MCG PO TABS
1000.0000 ug | ORAL_TABLET | Freq: Every day | ORAL | Status: DC
  Administered 2019-02-15 – 2019-02-20 (×6): 1000 ug via ORAL
  Filled 2019-02-15 (×6): qty 1

## 2019-02-15 MED ORDER — FUROSEMIDE 10 MG/ML IJ SOLN
80.0000 mg | Freq: Three times a day (TID) | INTRAMUSCULAR | Status: AC
  Administered 2019-02-15 (×3): 80 mg via INTRAVENOUS
  Filled 2019-02-15 (×3): qty 8

## 2019-02-15 MED ORDER — SPIRONOLACTONE 12.5 MG HALF TABLET
12.5000 mg | ORAL_TABLET | Freq: Every day | ORAL | Status: DC
  Administered 2019-02-15: 12.5 mg via ORAL
  Filled 2019-02-15 (×2): qty 1

## 2019-02-15 MED ORDER — ALBUTEROL SULFATE HFA 108 (90 BASE) MCG/ACT IN AERS
1.0000 | INHALATION_SPRAY | Freq: Four times a day (QID) | RESPIRATORY_TRACT | Status: DC | PRN
  Filled 2019-02-15: qty 6.7

## 2019-02-15 MED ORDER — FLEET ENEMA 7-19 GM/118ML RE ENEM: 1.0000 | ENEMA | Freq: Once | RECTAL | Status: DC

## 2019-02-15 MED ORDER — ADULT MULTIVITAMIN W/MINERALS CH
1.0000 | ORAL_TABLET | Freq: Every day | ORAL | Status: DC
  Administered 2019-02-15 – 2019-02-20 (×6): 1 via ORAL
  Filled 2019-02-15 (×6): qty 1

## 2019-02-15 NOTE — ED Notes (Signed)
US remains at the bedside 

## 2019-02-15 NOTE — ED Notes (Signed)
Family Medicine coverage contacted. Bed assignment being upgraded from tele.

## 2019-02-15 NOTE — ED Notes (Signed)
Ordered breakfast--Jodi [name] 

## 2019-02-15 NOTE — ED Notes (Signed)
Pt meal at bedside.

## 2019-02-15 NOTE — Progress Notes (Signed)
Family Medicine Teaching Service Daily Progress Note Intern Pager: [phone]  Patient name: Jodi [name] Medical record number: [id number] Date of birth: 08/21/1932 Age: 86 y.o. Gender: female  Primary Care Provider: [name], MD Consultants: cardiology, nephrology  Code Status: DNR   Pt Overview and Major Events to Date:  02/15/19: Admitted, nephrology and cardiology consulted    Assessment and Plan: Jodi [name] is a 86 y.o. female who presented with abdominal distention and 5 falls in last 4 days . PMH is significant for history of heart failure,hyperlipidemia,hypertension,CAD, CABG x2,AFIB,aortic stenosis, MR,CKD,OAand is admitted forworsening shortness of breath and lower extremity edema.Admitted for acute exacerbation of CHF.    Acute combined systolic and diastolic congestive heart failure Ascites  Patients BP soft this morning 104/48.  Repeat 117/47. S/p IV Lasix 80mg  TID yesterday. Patient down 1.9 kg from admission.  Urinary output 900 mL with one unmeasured occurrence.  Will hold Bidil, metolazone and Lasix today.  Patient with 2+ bilateral LE edema. Considering starting milrinone. Will abdominal ultrasound to assess for ascites.  Consider consulting IR if appreciable ascites demonstrated on ultrasound.  - Cardiology following, appreciated recommended  - Nephrology consulted, appreciate recommendations  - Palliatiave consulted, appreciate recommendations  - IV Lasix 80 mg TID - Metolazone 5 mg  - Aldactone 12.5 mg daily   - Strict Is/Os  - PT/OT eval and treat   Multiple falls  Patient had 5 unwitnessed mechanical falls in the four days prior to arrival. Patient is reasonably independent at home. Etiology likely multifactorial (CHFe, deconditioning, tripping over things in home, colloid cyst). Patient is on Eliquis but Head CT was negative for acute bleed. No bony abnormalities or injuries obvious physical exam.  Consider risks vs benefits  for Eliquis given multiple falls and bright red blood reported by family.  - Voltaren gel  -PT OT to evaluate and treat  -Follow up on orthostatic vitals      AKI  CKD Stage IV FeURea - indicated prerenal. Worsening Cr today at 3.1. BUN 122.  K 4.1. Baseline creatinine September 2020 was 1.9, but has been 2.4 since and was 2.8 on admission.  Per nephrology, consider discontinuing Aldactone outpatient if renal function continues to decline.  Nephrology does not recommend long-term dialysis given her comorbidities.  Hold fleets enemas as phosphate load can worsen AKI. - Nephrology consulted, appreciate recommendations  -Avoid nephrotoxic agents, attempt to balance renal and cardiac needs for diuresis - Fluid restriction to 1.5 L - Strict I/O - Daily weights - Vitamin D 2000 units - Daily BMP  pAFIB Rate controlled. HR 43-70. Family reported patient had bright red blood seen in underwear.  Hgb today 7.4. Will repeat CBC.. Threshold to transfuse is 8.  Consider transfusion and holding Eliquis.  Continue home eliquis 2.5 mg twice daily Continue home metoprolol 12.5 mg twice daily - Follow up CBC    Colloid cyst on CT Family and patient aware. Non obsturctive at this point, would require surgery to remove and patient is poor surgical candidate   Multiple Joint OA - Continue home medication management -Tylenol every 4 hours as needed   Hypercholesteremia Continue home rosuvastatin 20 mg daily   HTN-stable Continue home isosorbide hydralazine 20mg -37.5 mg 3 times daily   Insomnia  restless leg Recently discontinued off Ambien, daughter says that she believes gabapentin has been because of her waking up in the middle the night although her mother is emotionally attached to gabapentin which she says is the only thing  that treats her severe restless leg.  We discussed change in effective gabapentin dose with change in renal function. -As needed gabapentin reduced to 100  mg nightly  FEN/GI: Heart healthy diet with fluid restriction, Miralax PPx: Eliquis   Disposition: pending medical clearance and PT/OT eval   Subjective:  Jodi [name] complains of shoulder pain.    Objective: Temp:  [97.9 F (36.6 C)-98 F (36.7 C)] 98 F (36.7 C) (01/11 0512) Pulse Rate:  [56-70] 57 (01/11 0512) Resp:  [13-21] 20 (01/11 0512) BP: (104-148)/(47-90) 104/48 (01/11 0512) SpO2:  [93 %-97 %] 96 % (01/11 0512) Weight:  [72.7 kg-74.6 kg] 74.6 kg (01/11 0512)   Physical Exam: GEN: pleasant elderly female in no acute distress  CV: irregularly irregular rhythm and regular rate, + murmur  RESP: no increased work of breathing, bibasalilar rales ABD: Bowel sounds present. Nontender, distended MSK: 2+ LE edema to the knee, right shoulder with Kpad,  SKIN: warm, dry    Laboratory: Recent Labs  Lab 02/15/19 0004 02/16/19 0342  WBC 9.3 8.8  HGB 8.0* 7.5*  HCT 27.4* 24.3*  PLT 230 214   Recent Labs  Lab 02/15/19 0004 02/16/19 0342  NA 137 138  K 4.3 4.1  CL 95* 97*  CO2 28 29  BUN 115* 122*  CREATININE 2.82* 3.10*  CALCIUM 9.0 9.0  PROT 6.3*  --   BILITOT 1.0  --   ALKPHOS 77  --   ALT 14  --   AST 21  --   GLUCOSE 121* 122*      Imaging/Diagnostic Tests: CT Head Wo Contrast  Result Date: 02/15/2019 CLINICAL DATA:  Fall EXAM: CT HEAD WITHOUT CONTRAST TECHNIQUE: Contiguous axial images were obtained from the base of the skull through the vertex without intravenous contrast. COMPARISON:  None. FINDINGS: Brain: L no intracranial hemorrhage or extra-axial collection. There is advanced white matter disease consistent with chronic small vessel ischemia. There is an old right PCA territory infarct. There is a colloid cyst at the level of the foramina of [place] without hydrocephalus. Vascular: Atherosclerotic calcification of the internal carotid and vertebral arteries at the skull base Skull: Normal. Negative for fracture or focal lesion.  Sinuses/Orbits: No acute finding. Other: None. IMPRESSION: 1. No acute intracranial abnormality. 2. Advanced chronic small vessel ischemia and old right PCA territory infarct. 3. Small colloid cyst at the level of the foramina of [place] without hydrocephalus. Electronically Signed   By: Ulyses Jarred M.D.   On: 02/15/2019 01:42   US RENAL  Result Date: 02/15/2019 CLINICAL DATA:  Acute on chronic renal failure EXAM: RENAL / URINARY TRACT ULTRASOUND COMPLETE COMPARISON:  February 13, 2012 FINDINGS: Right Kidney: Renal measurements: 10.8 x 4.7 x 5.2 cm = volume: 138.2 mL. Contains a 2.3 cm septated cyst with no nodular components. Also contains a 2.5 cm simple cyst. Increased cortical echogenicity. Left Kidney: Renal measurements: 10.4 x 5.1 x 5.2 cm = volume: 145 mL. Contains renal cysts. No hydronephrosis. Increased cortical echogenicity. Bladder: Appears normal for degree of bladder distention. Other: Ascites. Small right pleural effusion. A small amount of perinephric fluid is seen on the left. IMPRESSION: 1. Bilateral renal cysts.  No hydronephrosis. 2. Medical renal disease. 3. Mild perinephric fluid on the left is nonspecific given ascites elsewhere. 4. Right pleural effusion. Electronically Signed   By: Dorise Bullion III M.D   On: 02/15/2019 14:00     Lyndee Hensen, DO PGY-1, [facility] 02/16/2019 8:21 AM  [place] Intern pager: [phone], text pages welcome

## 2019-02-15 NOTE — ED Triage Notes (Signed)
Pt brought to ED by [facility] from home where she lives with her daughter. Per daughter pt fell 2 days ago now she is having bloody stools and abd distended, pt has Hx of dementia denies any pain at this time. Pt on Eliquis per EMS. BP 130/76, HR 72, SPO2 94% RA CBG 169. A fib on the monitor.

## 2019-02-15 NOTE — ED Notes (Signed)
Family at bedside. 

## 2019-02-15 NOTE — ED Provider Notes (Signed)
[facility] EMERGENCY DEPARTMENT Provider Note   CSN: [id number] Arrival date & time: 02/15/19  0001   History Chief Complaint  Patient presents with  . Fall    Jodi [name] is a 86 y.o. female.  The history is provided by a relative and the patient.  Fall  She has history of hypertension, hyperlipidemia, atrial fibrillation anticoagulated on apixaban, heart failure, aortic stenosis, renal insufficiency, anemia and presents with multiple complaints.  She has had 5 falls in the last several days.  Patient states that she just loses her balance.  There has not been any injury from the falls.  Her daughter is noted that she seems generally weak.  On several occasions, she has noted bright red blood in the stool.  There has been increase in her leg swelling and her abdomen is gotten very distended.  There has been no fever or chills.  Patient is not complaining of hurting anywhere.  There has been no vomiting or diarrhea.  She is not complaining of chest pain, but daughter states that she sometimes appears to be out of breath.     Past Medical History:  Diagnosis Date  . Aortic stenosis   . Arthritis    "maybe in my fingers and toes" (09/28/2014)  . Bradycardia   . CHF (congestive heart failure) ([place])   . Chronic renal insufficiency, stage III (moderate)    [name] 09/27/2014  . Coronary artery disease   . Heart murmur   . Hyperlipidemia   . Hypertension   . Macular degeneration, left eye   . Myocardial infarction [facility]) 1995; 2003  . Paroxysmal atrial fibrillation (HCC)   . Renal cell carcinoma ([place]) 09/23/2012  . Renal mass 12/06/2010   CT Abdomen 11-27-10 upper pole region of the right kidney which is suspicious for solid lesion, measuring 1.9 x 1.3 cm. This lesion is concerning for renal cell carcinoma given the solid appearance.  Following with Dr [name].  Renal bx was benign    . Shortness of breath   . Splenic infarct 12/06/2010  . Stroke [facility]) 2003   "when I had heart surgery"; denies residual on 09/28/2014  . TIA (transient ischemic attack) "several"    Patient Active Problem List   Diagnosis Date Noted  . Gait instability 01/21/2019  . Restless legs 12/17/2018  . Acute on chronic combined systolic and diastolic congestive heart failure ([place]) 08/26/2018  . Recurrent UTI 01/27/2018  . Acute on chronic diastolic (congestive) heart failure ([place]) 12/04/2016  . Thyroid nodule 08/21/2016  . Hyperglycemia 11/09/2015  . Chronic combined systolic and diastolic congestive heart failure ([place]) 03/04/2015  . Left renal mass   . Paroxysmal atrial fibrillation ([place]) 08/20/2013  . Renal cell carcinoma ([place]) 09/23/2012  . Chronic kidney disease (CKD), stage III (moderate) ([place]) 02/20/2012  . Aortic stenosis, severe 12/06/2010  . SEBORRHEA 11/10/2007  . Insomnia 06/09/2007  . HYPERCHOLESTEROLEMIA 04/04/2006  . HYPERTENSION, BENIGN SYSTEMIC 04/04/2006  . CORONARY, ARTERIOSCLEROSIS 04/04/2006  . Acute combined systolic and diastolic heart failure ([place]) 04/04/2006  . Osteoarthritis involving multiple joints on both sides of body 04/04/2006    Past Surgical History:  Procedure Laterality Date  . CARDIAC CATHETERIZATION    . CARDIOVERSION N/A 10/20/2013   Procedure: CARDIOVERSION;  Surgeon: Laverda Page, MD;  Location: [place];  Service: Cardiovascular;  Laterality: N/A;  H&P in file  . CARDIOVERSION N/A 05/18/2014   Procedure: CARDIOVERSION;  Surgeon: [name], MD;  Location: [place];  Service: Cardiovascular;  Laterality: N/A;  . CARDIOVERSION N/A 10/31/2016   Procedure: CARDIOVERSION;  Surgeon: [name], MD;  Location: [facility] ENDOSCOPY;  Service: Cardiovascular;  Laterality: N/A;  . CARDIOVERSION N/A 11/20/2016   Procedure: CARDIOVERSION;  Surgeon: [name], MD;  Location: [facility] ENDOSCOPY;  Service: Cardiovascular;  Laterality: N/A;  . CATARACT EXTRACTION W/ INTRAOCULAR LENS  IMPLANT, BILATERAL Bilateral ~ 2013  . CORONARY  ANGIOPLASTY WITH STENT PLACEMENT  2002  . CORONARY ARTERY BYPASS GRAFT  1995 and 2003  . IR RADIOLOGIST EVAL & MGMT  07/03/2016  . LEFT AND RIGHT HEART CATHETERIZATION WITH CORONARY ANGIOGRAM N/A 11/24/2013   Procedure: LEFT AND RIGHT HEART CATHETERIZATION WITH CORONARY ANGIOGRAM;  Surgeon: Laverda Page, MD;  Location: [facility] CATH LAB;  Service: Cardiovascular;  Laterality: N/A;  . PERCUTANEOUS NEEDLE BIOPSY OF RENAL LESION  ?2014  . TOTAL ABDOMINAL HYSTERECTOMY  1990's   "both ovaries were full of little tiny sores"  . TYMPANOPLASTY Bilateral    "had holes in them; still have holes in them"     OB History   No obstetric history on file.     Family History  Problem Relation Age of Onset  . Coronary artery disease Mother   . Stroke Mother   . Leukemia Father   . Leukemia Sister   . Cancer Brother   . Coronary artery disease Brother   . Heart attack Brother   . Heart attack Son   . Heart attack Daughter   . Myelodysplastic syndrome Sister     Social History   Tobacco Use  . Smoking status: Never Smoker  . Smokeless tobacco: Never Used  Substance Use Topics  . Alcohol use: No  . Drug use: No    Home Medications Prior to Admission medications   Medication Sig Start Date End Date Taking? Authorizing Provider  BIDIL 20-37.5 MG tablet TAKE 2 TABLETS BY MOUTH 3 TIMES A DAY Patient taking differently: Take 2 tablets by mouth 3 (three) times daily.  06/16/18   [name], MD  ELIQUIS 2.5 MG TABS tablet TAKE 1 TABLET BY MOUTH 2 TIMES DAILY. 01/05/19   [name], MD  furosemide (LASIX) 40 MG tablet Take 40 mg by mouth daily. 01/28/19   [provider]  gabapentin (NEURONTIN) 100 MG capsule Use 2-5 caps at bedtime as needed Patient taking differently: Use 3 caps at bedtime as needed 01/21/19   [name], MD  metolazone (ZAROXOLYN) 5 MG tablet Take 1 tablet (5 mg total) by mouth daily at 2 PM. 12/12/18   [name], MD  metoprolol tartrate  (LOPRESSOR) 25 MG tablet TAKE 1/2 TABLET BY MOUTH 2 TIMES A DAY. 10/31/18   [name], MD  Multiple Vitamin (MULTIVITAMIN WITH MINERALS) TABS tablet Take 1 tablet by mouth daily.    [provider]  PROAIR HFA 108 (90 Base) MCG/ACT inhaler INHALE 1 PUFF INTO THE LUNGS EVERY 6 HOURS AS NEEDED FOR WHEEZING OR SHORTNESS OF BREATH. Patient taking differently: Inhale 1 puff into the lungs every 6 (six) hours as needed for wheezing or shortness of breath.  08/04/18   Chambliss, Jeb Levering, MD  rosuvastatin (CRESTOR) 20 MG tablet TAKE 1 TABLET BY MOUTH DAILY Patient taking differently: Take 20 mg by mouth daily.  04/22/18   [name], MD  spironolactone (ALDACTONE) 25 MG tablet Take 0.5 tablets (12.5 mg total) by mouth daily. 02/09/19   [name], MD  vitamin B-12 (CYANOCOBALAMIN) 1000 MCG tablet Take 1,000 mcg by mouth daily.  [provider]    Allergies    Sulfamethoxazole-trimethoprim and Tape  Review of Systems   Review of Systems  All other systems reviewed and are negative.   Physical Exam Updated Vital Signs BP 127/68   Pulse (!) 58   Temp 97.6 F (36.4 C) (Oral)   Resp 13   SpO2 94%   Physical Exam Vitals and nursing note reviewed.   86 year old female, resting comfortably and in no acute distress. Vital signs are normal. Oxygen saturation is 97%, which is normal. Head is normocephalic and atraumatic. PERRLA, EOMI. Oropharynx is clear. Neck is nontender and supple without adenopathy or JVD. Back is nontender and there is no CVA tenderness.  There is 2+ presacral edema. Lungs are clear without rales, wheezes, or rhonchi. Chest is nontender. Heart has an irregular rhythm with 3/6 systolic ejection murmur best heard along the left sternal border. Abdomen is soft, moderately distended, nontender without masses or hepatosplenomegaly and peristalsis is normoactive.  No fluid wave detected. Rectal: Slightly decreased sphincter tone.  Moderately  hard stool present as light brown and Hemoccult negative. Extremities have 2+ edema, full range of motion is present. Skin is warm and dry without rash. Neurologic: Mental status is normal, cranial nerves are intact, there are no motor or sensory deficits.  ED Results / Procedures / Treatments   Labs (all labs ordered are listed, but only abnormal results are displayed) Labs Reviewed  COMPREHENSIVE METABOLIC PANEL  CBC WITH DIFFERENTIAL/PLATELET  URINALYSIS, ROUTINE W REFLEX MICROSCOPIC  BRAIN NATRIURETIC PEPTIDE  POC OCCULT BLOOD, ED  TYPE AND SCREEN    EKG EKG Interpretation  Date/Time:  Sunday February 15 2019 00:02:37 EST Ventricular Rate:  66 PR Interval:    QRS Duration: 139 QT Interval:  472 QTC Calculation: 495 R Axis:   65 Text Interpretation: Atrial fibrillation Nonspecific intraventricular conduction delay Inferior infarct, old Probable anterolateral infarct, old When compared with ECG of 02/03/2019, Premature ventricular complexes are no longer present Confirmed by Delora Fuel ([id number]) on 02/15/2019 12:26:31 AM   Radiology No results found.  Procedures Procedures   Medications Ordered in ED Medications - No data to display  ED Course  I have reviewed the triage vital signs and the nursing notes.  Pertinent labs & imaging results that were available during my care of the patient were reviewed by me and considered in my medical decision making (see chart for details).  MDM Rules/Calculators/A&P Multiple complaints.  Because of falls and patient anticoagulated, will check CT of head.  To evaluate possible reasons for falling, will check urinalysis to look for UTI, check electrolytes.  Because of abdominal distention, will check hepatic functions.  Old records are reviewed, and in her cardiologist's office on January 6, he reports no edema.  Hemoglobin had been trending down from 11.8 on September 1, 9.9 on November 17, 8.8 on December 29.  Daughter relates GI  bleeding, but no blood seen on my exam.  However, progressive anemia certainly would contribute to weakness.  CT of the head shows no acute injury.  Electrolytes are unremarkable, but renal function is worsening with creatinine 2.82 compared with 2.46 on December 29.  Hemoglobin has fallen further to 8.0.  BNP is increasing and is now appropriate 1300.   Chest x-ray is read by radiologist as suspicious for pneumonia, but I feel her presentation is much more consistent with worsening heart failure.  ECG shows atrial fibrillation and is unchanged from prior.  With worsening renal function, I  feel that diuresis would best be accomplished as an inpatient.  Case is discussed with Dr. Criss Rosales of the family practice service, who agrees to admit the patient.  Final Clinical Impression(s) / ED Diagnoses Final diagnoses:  Acute on chronic combined systolic and diastolic heart failure (HCC)  Chronic atrial fibrillation (HCC)  Chronic anticoagulation  Acute on chronic renal insufficiency  Normocytic anemia    Rx / DC Orders ED Discharge Orders    None       Delora Fuel, MD 42/55/25 [phone]

## 2019-02-15 NOTE — H&P (Addendum)
[facility] Admission History and Physical Service Pager: [phone]  Patient name: Jodi [name] Medical record number: [id number] Date of birth: [birth year] Age: 86 y.o. Gender: female  Primary Care Provider: [name], MD Consultants: Cardiology Code Status: DNR  Chief Complaint: falls x5 and abdominal distention  Assessment and Plan: Jodi [name] is a 86 y.o. female presenting with abdominal distention and 5 falls in last 4 days . PMH is significant for history of heart failure, hyperlipidemia, hypertension, CAD, CABG x2, AFIB, aortic stenosis, MR, CKD, OA and is admitted for worsening shortness of breath and lower extremity edema.  Admitted for acute exacerbation of CHF.  Acute combined systolic and diastolic congestive heart failure with severe aortic stenosis Patient with mild bilateral crackles and 2+ pitting edema to the knee and significant abdominal distention.  Cardiology notes reviewed (Ganji visait on 1/6).     Patient has been drinking fluid at will, charted EDW 148-150lbs, not weighed this admission yet.    ECHO on 08/26/2018 showed an EF of 35-40% with LV  diffuse hypokinesis and mitral valve degeneration, severely thickened aortic valve with moderate to severe stenosis.Daughter thinks that she is not having good diuretic output on 40 mg, she was told that if her mom started to look like she was swelling that she could give an extra dose and has been giving that as a second dose as opposed to an increased single dose. In chart review, the patient and and her daughters have been discussing her poor prognosis with Dr. Einar Gip, palliative consult has been ordered and scheduled with family.  Given worsening renal function, question of intravascular dehydration versus total body fluid overload.  Based off exam and rising BNP I believe fluid overload most likely at this time. -Admit to MedSurg, Dr. Gwendlyn Deutscher -Consulted Dr. Einar Gip morning of  January 10, appreciate recs.  We will continue his outpatient regiment with exception of IV Lasix increase to 80 IV lasix TID (ordered for 3 doses, can reassess 1/11) -We will discuss with attending consult of IR for therapeutic paracentesis -IV Lasix 40 mg -Continue home metolazone 5 mg daily -Continue home Spironolactone 12.5 mg daily -Fluid restrict diet to 1500, strict ins and outs and daily weights -Daily BMP  Falls (5 in last 4 days): None a witnessed as patient is reasonably independent while walking around her daughter's house.  Per family attributed to generalized weakness and mechanical falls.  Patient is on Eliquis but Head CT is negative for acute bleed.  Do not think abdominal bleed is likely given patient is reasonably stable hemoglobin and blood pressure, does have chronic abdominal distention I believe that this is third spacing from her CHF.  No bony abnormalities or injuries obvious physical exam -PT OT to evaluate -Orthostatics  Colloid cyst on CT:  Non obsturctive at this point, would require surgery to remove and patient is poor surgical candidate -will ensure family is aware prior to d/c  Multiple Joint OA - Continue home medication management  - Tylenol every 4 hours as needed  AKI versus worsening CKD Stage III-baseline creatinine September 2020 was 1.9, but has been 2.4 since and now 2.8 on admission.  Daughter says her mom occasionally uses milk of magnesia to ensure a bowel movement daily and is concerned that she might be dehydrated although she appears to be total body fluid up.  We discussed difference between intravascular and total body fluid status and at this point will attempt diuresis unless instructed otherwise by  Dr. Einar Gip.  I have told the family that I do not believe she is a good candidate for dialysis if her kidney function does not improve and continues to worsen, they can continue having that discussion with palliative care although at this point we  hope for improved kidney function. - Avoid nephrotoxic agents, attempt to balance renal and cardiac needs for diuresis -Fluid restriction to 1.5 L, strict I/O, daily weights -Vitamin D 2000 units -Daily BMP  pAFI B- rate controlled on admission Continue home eliquis 2.5 mg twice daily Continue home metoprolol 12.5 mg twice daily  Hypercholesteremia- Continue home rosuvastatin 20 mg daily  HTN-stable Continue home isosorbide hydralazine 20mg  -37.5 mg 3 times daily  Insomnia/restless leg Recently discontinued off Ambien, daughter says that she believes gabapentin has been because of her waking up in the middle the night although her mother is emotionally attached to gabapentin which she says is the only thing that treats her severe restless leg.  We discussed change in effective gabapentin dose with change in renal function. -As needed gabapentin reduced to 100 mg nightly  FEN/GI: Heart healthy diet fluid restrict to 1500 mL Prophylaxis: Patient is on Eliquis for A. fib  Disposition: After stabilization expect DC to home, family is long-term goal is daytime home health with patient living independently and children rotating spending the night with her  History of Present Illness:  Jodi [name] is a 86 y.o. female presenting with report of 5 mechanicl falls in last 4 days per daughter (she lives with her).  Daughter thinks it is chronic weakness and the swelling in her legs/abdomen.  No syncope/seizure activity/no bleeding reports but as she is on eliquis that was a concern for daughter.  NO known sick ocntacts.  Son, Ronalee Belts, is Economist but lives with daughter.  Daughter is concerned of her ability to maintain given that she has a special needs child too.  No pain compliant, chronic dementia (aox1, conversational, hard of hearing and doesn't have hearing aid here).  Claims no diuretic response on lasix 40 at home, has been noncompliant on fluid restriction.  Daughter manages meds and  thinks they have been pretty compliant with those.  Palliative had been ordered outpatient but meeting hasn't happened yet, family confirms DNR which is consistent with prior visits.  Review Of Systems: Per HPI   Patient Active Problem List   Diagnosis Date Noted  . Falling 02/15/2019  . Gait instability 01/21/2019  . Restless legs 12/17/2018  . Acute on chronic combined systolic and diastolic congestive heart failure ([place]) 08/26/2018  . Recurrent UTI 01/27/2018  . Acute on chronic diastolic (congestive) heart failure ([place]) 12/04/2016  . Thyroid nodule 08/21/2016  . Hyperglycemia 11/09/2015  . Chronic combined systolic and diastolic congestive heart failure ([place]) 03/04/2015  . Left renal mass   . Paroxysmal atrial fibrillation ([place]) 08/20/2013  . Renal cell carcinoma ([place]) 09/23/2012  . Chronic kidney disease (CKD), stage III (moderate) ([place]) 02/20/2012  . Aortic stenosis, severe 12/06/2010  . SEBORRHEA 11/10/2007  . Insomnia 06/09/2007  . HYPERCHOLESTEROLEMIA 04/04/2006  . HYPERTENSION, BENIGN SYSTEMIC 04/04/2006  . CORONARY, ARTERIOSCLEROSIS 04/04/2006  . Acute combined systolic and diastolic heart failure ([place]) 04/04/2006  . Osteoarthritis involving multiple joints on both sides of body 04/04/2006    Past Medical History: Past Medical History:  Diagnosis Date  . Aortic stenosis   . Arthritis    "maybe in my fingers and toes" (09/28/2014)  . Bradycardia   . CHF (congestive heart failure) ([place])   .  Chronic renal insufficiency, stage III (moderate)    [name] 09/27/2014  . Coronary artery disease   . Heart murmur   . Hyperlipidemia   . Hypertension   . Macular degeneration, left eye   . Myocardial infarction [facility]) 1995; 2003  . Paroxysmal atrial fibrillation ([facility])   . Renal cell carcinoma ([place]) 09/23/2012  . Renal mass 12/06/2010   CT Abdomen 11-27-10 upper pole region of the right kidney which is suspicious for solid lesion, measuring 1.9 x 1.3 cm. This lesion is  concerning for renal cell carcinoma given the solid appearance.  Following with Dr [name].  Renal bx was benign    . Shortness of breath   . Splenic infarct 12/06/2010  . Stroke [facility]) 2003   "when I had heart surgery"; denies residual on 09/28/2014  . TIA (transient ischemic attack) "several"    Past Surgical History: Past Surgical History:  Procedure Laterality Date  . CARDIAC CATHETERIZATION    . CARDIOVERSION N/A 10/20/2013   Procedure: CARDIOVERSION;  Surgeon: Laverda Page, MD;  Location: [facility];  Service: Cardiovascular;  Laterality: N/A;  H&P in file  . CARDIOVERSION N/A 05/18/2014   Procedure: CARDIOVERSION;  Surgeon: [name], MD;  Location[facility] ENDOSCOPY;  Service: Cardiovascular;  Laterality: N/A;  . CARDIOVERSION N/A 10/31/2016   Procedure: CARDIOVERSION;  Surgeon: [name], MD;  Location: [facility] ENDOSCOPY;  Service: Cardiovascular;  Laterality: N/A;  . CARDIOVERSION N/A 11/20/2016   Procedure: CARDIOVERSION;  Surgeon: [name], MD;  Location: [facility];  Service: Cardiovascular;  Laterality: N/A;  . CATARACT EXTRACTION W/ INTRAOCULAR LENS  IMPLANT, BILATERAL Bilateral ~ 2013  . CORONARY ANGIOPLASTY WITH STENT PLACEMENT  2002  . CORONARY ARTERY BYPASS GRAFT  1995 and 2003  . IR RADIOLOGIST EVAL & MGMT  07/03/2016  . LEFT AND RIGHT HEART CATHETERIZATION WITH CORONARY ANGIOGRAM N/A 11/24/2013   Procedure: LEFT AND RIGHT HEART CATHETERIZATION WITH CORONARY ANGIOGRAM;  Surgeon: Laverda Page, MD;  Location: [facility] CATH LAB;  Service: Cardiovascular;  Laterality: N/A;  . PERCUTANEOUS NEEDLE BIOPSY OF RENAL LESION  ?2014  . TOTAL ABDOMINAL HYSTERECTOMY  1990's   "both ovaries were full of little tiny sores"  . TYMPANOPLASTY Bilateral    "had holes in them; still have holes in them"    Social History: Social History   Tobacco Use  . Smoking status: Never Smoker  . Smokeless tobacco: Never Used  Substance Use Topics  . Alcohol use: No  . Drug use: No    Additional social history: families goal is daytime home health supervision w/ family rotating duties at night Please also refer to relevant sections of EMR.  Family History: Family History  Problem Relation Age of Onset  . Coronary artery disease Mother   . Stroke Mother   . Leukemia Father   . Leukemia Sister   . Cancer Brother   . Coronary artery disease Brother   . Heart attack Brother   . Heart attack Son   . Heart attack Daughter   . Myelodysplastic syndrome Sister     Allergies and Medications: Allergies  Allergen Reactions  . Sulfamethoxazole-Trimethoprim Other (See Comments)    Caused shaking and chills  . Tape Other (See Comments)    Tears skin.  Please use "paper" tape   No current facility-administered medications on file prior to encounter.   Current Outpatient Medications on File Prior to Encounter  Medication Sig Dispense Refill  . BIDIL 20-37.5 MG tablet TAKE 2 TABLETS BY MOUTH 3 TIMES  A DAY (Patient taking differently: Take 2 tablets by mouth 3 (three) times daily. ) 540 tablet 3  . ELIQUIS 2.5 MG TABS tablet TAKE 1 TABLET BY MOUTH 2 TIMES DAILY. 180 tablet 1  . furosemide (LASIX) 40 MG tablet Take 40 mg by mouth daily.    Marland Kitchen gabapentin (NEURONTIN) 100 MG capsule Use 2-5 caps at bedtime as needed (Patient taking differently: Use 3 caps at bedtime as needed) 150 capsule 1  . metolazone (ZAROXOLYN) 5 MG tablet Take 1 tablet (5 mg total) by mouth daily at 2 PM. 90 tablet 1  . metoprolol tartrate (LOPRESSOR) 25 MG tablet TAKE 1/2 TABLET BY MOUTH 2 TIMES A DAY. 90 tablet 1  . Multiple Vitamin (MULTIVITAMIN WITH MINERALS) TABS tablet Take 1 tablet by mouth daily.    Marland Kitchen PROAIR HFA 108 (90 Base) MCG/ACT inhaler INHALE 1 PUFF INTO THE LUNGS EVERY 6 HOURS AS NEEDED FOR WHEEZING OR SHORTNESS OF BREATH. (Patient taking differently: Inhale 1 puff into the lungs every 6 (six) hours as needed for wheezing or shortness of breath. ) 8.5 g 2  . rosuvastatin (CRESTOR) 20 MG  tablet TAKE 1 TABLET BY MOUTH DAILY (Patient taking differently: Take 20 mg by mouth daily. ) 90 tablet 3  . spironolactone (ALDACTONE) 25 MG tablet Take 0.5 tablets (12.5 mg total) by mouth daily. 30 tablet 3  . vitamin B-12 (CYANOCOBALAMIN) 1000 MCG tablet Take 1,000 mcg by mouth daily.      Objective: BP (!) 100/56   Pulse (!) 54   Temp 97.6 F (36.4 C) (Oral)   Resp 19   SpO2 91%  Exam: General: no distress, comfortable, demented Eyes: Clear sclera ENTM: No stridor, no epistaxis Neck: No injury noted Cardiovascular: Irregular rhythm, rate controlled, significant murmur, 2-3+ pitting edema Respiratory: Crackles bilaterally mild, no cough during exam, no increased work of breathing Gastrointestinal: Significant abdominal distention, tympanic but no rebound or tenderness MSK: No significant bony injury noted despite history of falls Neuro: No gross neurological deficits Psych: A&O x1, demented but conversational (does not have hearing aid in must be allowed to speak to her)  Labs and Imaging: CBC BMET  Recent Labs  Lab 02/15/19 0004  WBC 9.3  HGB 8.0*  HCT 27.4*  PLT 230   Recent Labs  Lab 02/15/19 0004  NA 137  K 4.3  CL 95*  CO2 28  BUN 115*  CREATININE 2.82*  GLUCOSE 121*  CALCIUM 9.0     CT Head Wo Contrast  Result Date: 02/15/2019 CLINICAL DATA:  Fall EXAM: CT HEAD WITHOUT CONTRAST TECHNIQUE: Contiguous axial images were obtained from the base of the skull through the vertex without intravenous contrast. COMPARISON:  None. FINDINGS: Brain: L no intracranial hemorrhage or extra-axial collection. There is advanced white matter disease consistent with chronic small vessel ischemia. There is an old right PCA territory infarct. There is a colloid cyst at the level of the foramina of [place] without hydrocephalus. Vascular: Atherosclerotic calcification of the internal carotid and vertebral arteries at the skull base Skull: Normal. Negative for fracture or focal lesion.  Sinuses/Orbits: No acute finding. Other: None. IMPRESSION: 1. No acute intracranial abnormality. 2. Advanced chronic small vessel ischemia and old right PCA territory infarct. 3. Small colloid cyst at the level of the foramina of [place] without hydrocephalus. Electronically Signed   By: Ulyses Jarred M.D.   On: 02/15/2019 01:42    [name], DO 02/15/2019, 6:28 AM PGY-3, [place] Intern pager: [phone],  text pages welcome
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
## 2019-02-15 NOTE — Consult Note (Signed)
[facility]  INPATIENT CONSULTATION  Reason for Consultation: AKI on CKD Requesting Provider: Dr. Gwendlyn Deutscher  HPI: Jodi [name] is an 86 y.o. female with CAD, AS, pAfib, HTN, HL, combined CHF, CKD 3 who presented with a fall and is seen for eval and management of AKI on CKD.    Presented with multiple mechanical falls in the past 4 days thought to be due to weakness and edema.  She's had less diuretic response lately to her outpt diuretics - sees Dr. Einar Gip outpt, last 1/6 at which time she was continued on her same medications - lasix 40, aldactone 12.5 metolazone 5.  Daughter feels she's been less responsive to diuretics lately.   Baseline Cr appaers to be in the 2.2-2.5 range.  It was 2.8 on presentation today. BUN 115. BUN 1319.  Renal US normal size, echogenic, benign cysts,  + ascites, pleural effusion noted.  UA with mod LE and 11-20 WBC but o/w benign.   She's been admitted for diuresis and is currently rx'd lasix 80 IV TID, metolazone 5 daily and aldactone 12.5 daily.   She's DNR but full medical interventions. Family not at bedside when we spoke.  PMH: Past Medical History:  Diagnosis Date  . Aortic stenosis   . Arthritis    "maybe in my fingers and toes" (09/28/2014)  . Bradycardia   . CHF (congestive heart failure) ([place])   . Chronic renal insufficiency, stage III (moderate)    [name] 09/27/2014  . Coronary artery disease   . Heart murmur   . Hyperlipidemia   . Hypertension   . Macular degeneration, left eye   . Myocardial infarction [facility]) 1995; 2003  . Paroxysmal atrial fibrillation (HCC)   . Renal cell carcinoma ([place]) 09/23/2012  . Renal mass 12/06/2010   CT Abdomen 11-27-10 upper pole region of the right kidney which is suspicious for solid lesion, measuring 1.9 x 1.3 cm. This lesion is concerning for renal cell carcinoma given the solid appearance.  Following with Dr [name].  Renal bx was benign    . Shortness of breath   . Splenic infarct 12/06/2010   . Stroke [facility]) 2003   "when I had heart surgery"; denies residual on 09/28/2014  . TIA (transient ischemic attack) "several"   PSH: Past Surgical History:  Procedure Laterality Date  . CARDIAC CATHETERIZATION    . CARDIOVERSION N/A 10/20/2013   Procedure: CARDIOVERSION;  Surgeon: Laverda [place], MD;  Location: [place];  Service: Cardiovascular;  Laterality: N/A;  H&P in file  . CARDIOVERSION N/A 05/18/2014   Procedure: CARDIOVERSION;  Surgeon: [name], MD;  Location: [facility] ENDOSCOPY;  Service: Cardiovascular;  Laterality: N/A;  . CARDIOVERSION N/A 10/31/2016   Procedure: CARDIOVERSION;  Surgeon: [name], MD;  Location: [facility] ENDOSCOPY;  Service: Cardiovascular;  Laterality: N/A;  . CARDIOVERSION N/A 11/20/2016   Procedure: CARDIOVERSION;  Surgeon: [name], MD;  Location: [place];  Service: Cardiovascular;  Laterality: N/A;  . CATARACT EXTRACTION W/ INTRAOCULAR LENS  IMPLANT, BILATERAL Bilateral ~ 2013  . CORONARY ANGIOPLASTY WITH STENT PLACEMENT  2002  . CORONARY ARTERY BYPASS GRAFT  1995 and 2003  . IR RADIOLOGIST EVAL & MGMT  07/03/2016  . LEFT AND RIGHT HEART CATHETERIZATION WITH CORONARY ANGIOGRAM N/A 11/24/2013   Procedure: LEFT AND RIGHT HEART CATHETERIZATION WITH CORONARY ANGIOGRAM;  Surgeon: Laverda [place], MD;  Location: [facility] CATH LAB;  Service: Cardiovascular;  Laterality: N/A;  . PERCUTANEOUS NEEDLE BIOPSY OF RENAL LESION  ?2014  . TOTAL ABDOMINAL  HYSTERECTOMY  1990's   "both ovaries were full of little tiny sores"  . TYMPANOPLASTY Bilateral    "had holes in them; still have holes in them"     Past Medical History:  Diagnosis Date  . Aortic stenosis   . Arthritis    "maybe in my fingers and toes" (09/28/2014)  . Bradycardia   . CHF (congestive heart failure) ([place])   . Chronic renal insufficiency, stage III (moderate)    [name] 09/27/2014  . Coronary artery disease   . Heart murmur   . Hyperlipidemia   . Hypertension   . Macular degeneration, left eye    . Myocardial infarction [facility]) 1995; 2003  . Paroxysmal atrial fibrillation (HCC)   . Renal cell carcinoma ([place]) 09/23/2012  . Renal mass 12/06/2010   CT Abdomen 11-27-10 upper pole region of the right kidney which is suspicious for solid lesion, measuring 1.9 x 1.3 cm. This lesion is concerning for renal cell carcinoma given the solid appearance.  Following with Dr [name].  Renal bx was benign    . Shortness of breath   . Splenic infarct 12/06/2010  . Stroke [facility]) 2003   "when I had heart surgery"; denies residual on 09/28/2014  . TIA (transient ischemic attack) "several"    Medications:  I have reviewed the patient's current medications.  (Not in a hospital admission)   ALLERGIES:   Allergies  Allergen Reactions  . Sulfamethoxazole-Trimethoprim Other (See Comments)    Caused shaking and chills  . Tape Other (See Comments)    Tears skin.  Please use "paper" tape    FAM HX: Family History  Problem Relation Age of Onset  . Coronary artery disease Mother   . Stroke Mother   . Leukemia Father   . Leukemia Sister   . Cancer Brother   . Coronary artery disease Brother   . Heart attack Brother   . Heart attack Son   . Heart attack Daughter   . Myelodysplastic syndrome Sister     Social History:   reports that she has never smoked. She has never used smokeless tobacco. She reports that she does not drink alcohol or use drugs.  ROS: 12 system ros per HPI above  Blood pressure 139/72, pulse (!) 59, temperature 97.6 F (36.4 C), temperature source Oral, resp. rate 16, SpO2 94 %. PHYSICAL EXAM: Gen: elderly woman on stretcher in no distress  Eyes:  Anicteric, +glasses ENT: MMM Neck: supple, elevated JVD to just below jaw at 45 degrees CV:  Irreg, no rub Abd:  Soft, nontender, says she feels mod distended Lungs: normal WOB at rest, dec BS in bases with a few crackle, no wheezes GU: no foley Extr: 2+ pitting tibial edema Neuro: conversant, hard of hearing   Results for  orders placed or performed during the hospital encounter of 02/15/19 (from the past 48 hour(s))  Comprehensive metabolic panel     Status: Abnormal   Collection Time: 02/15/19 12:04 AM  Result Value Ref Range   Sodium 137 135 - 145 mmol/L   Potassium 4.3 3.5 - 5.1 mmol/L   Chloride 95 (L) 98 - 111 mmol/L   CO2 28 22 - 32 mmol/L   Glucose, Bld 121 (H) 70 - 99 mg/dL   BUN 115 (H) 8 - 23 mg/dL   Creatinine, Ser 2.82 (H) 0.44 - 1.00 mg/dL   Calcium 9.0 8.9 - 10.3 mg/dL   Total Protein 6.3 (L) 6.5 - 8.1 g/dL   Albumin 3.6 3.5 -  5.0 g/dL   AST 21 15 - 41 U/L   ALT 14 0 - 44 U/L   Alkaline Phosphatase 77 38 - 126 U/L   Total Bilirubin 1.0 0.3 - 1.2 mg/dL   GFR calc non Af Amer 15 (L) >60 mL/min   GFR calc Af Amer 17 (L) >60 mL/min   Anion gap 14 5 - 15    Comment: Performed at [facility] [address], [place], [place] [address]  CBC with Differential     Status: Abnormal   Collection Time: 02/15/19 12:04 AM  Result Value Ref Range   WBC 9.3 4.0 - 10.5 K/uL   RBC 3.19 (L) 3.87 - 5.11 MIL/uL   Hemoglobin 8.0 (L) 12.0 - 15.0 g/dL   HCT 27.4 (L) 36.0 - 46.0 %   MCV 85.9 80.0 - 100.0 fL   MCH 25.1 (L) 26.0 - 34.0 pg   MCHC 29.2 (L) 30.0 - 36.0 g/dL   RDW 17.7 (H) 11.5 - 15.5 %   Platelets 230 150 - 400 K/uL   nRBC 0.0 0.0 - 0.2 %   Neutrophils Relative % 53 %   Neutro Abs 5.0 1.7 - 7.7 K/uL   Lymphocytes Relative 27 %   Lymphs Abs 2.5 0.7 - 4.0 K/uL   Monocytes Relative 13 %   Monocytes Absolute 1.2 (H) 0.1 - 1.0 K/uL   Eosinophils Relative 6 %   Eosinophils Absolute 0.5 0.0 - 0.5 K/uL   Basophils Relative 1 %   Basophils Absolute 0.1 0.0 - 0.1 K/uL   Immature Granulocytes 0 %   Abs Immature Granulocytes 0.03 0.00 - 0.07 K/uL    Comment: Performed at [facility], [place] [address] [place], [place] [address]  Type and screen [place]     Status: None   Collection Time: 02/15/19 12:04 AM  Result Value Ref Range   ABO/RH(D) B POS    Antibody  Screen NEG    Sample Expiration      02/18/2019,2359 Performed at [facility], [place] [address], [place], [place] [address]   Brain natriuretic peptide     Status: Abnormal   Collection Time: 02/15/19 12:04 AM  Result Value Ref Range   B Natriuretic Peptide 1,319.8 (H) 0.0 - 100.0 pg/mL    Comment: Performed at [facility] [address] [place], [place] [address]  ABO/Rh     Status: None   Collection Time: 02/15/19 12:04 AM  Result Value Ref Range   ABO/RH(D)      B POS Performed at [facility] [address] [place],  [address]   POC occult blood, ED Provider will collect     Status: None   Collection Time: 02/15/19 12:33 AM  Result Value Ref Range   Fecal Occult Bld NEGATIVE NEGATIVE  Urinalysis, Routine w reflex microscopic     Status: Abnormal   Collection Time: 02/15/19  3:36 AM  Result Value Ref Range   Color, Urine YELLOW YELLOW   APPearance HAZY (A) CLEAR   Specific Gravity, Urine 1.011 1.005 - 1.030   pH 6.0 5.0 - 8.0   Glucose, UA NEGATIVE NEGATIVE mg/dL   Hgb urine dipstick NEGATIVE NEGATIVE   Bilirubin Urine NEGATIVE NEGATIVE   Ketones, ur NEGATIVE NEGATIVE mg/dL   Protein, ur NEGATIVE NEGATIVE mg/dL   Nitrite NEGATIVE NEGATIVE   Leukocytes,Ua MODERATE (A) NEGATIVE   RBC / HPF 0-5 0 - 5 RBC/hpf  WBC, UA 11-20 0 - 5 WBC/hpf   Bacteria, UA RARE (A) NONE SEEN   Squamous Epithelial / LPF 0-5 0 - 5    Comment: Performed at [facility], [place] [address] [place], [place] [address]  SARS CORONAVIRUS 2 (TAT 6-24 HRS) Nasopharyngeal Nasopharyngeal Swab     Status: None   Collection Time: 02/15/19  3:36 AM   Specimen: Nasopharyngeal Swab  Result Value Ref Range   SARS Coronavirus 2 NEGATIVE NEGATIVE    Comment: (NOTE) SARS-CoV-2 target nucleic acids are NOT DETECTED. The SARS-CoV-2 RNA is generally detectable in upper and lower respiratory specimens during the acute phase of infection. Negative results do not preclude SARS-CoV-2  infection, do not rule out co-infections with other pathogens, and should not be used as the sole basis for treatment or other patient management decisions. Negative results must be combined with clinical observations, patient history, and epidemiological information. The expected result is Negative. Fact Sheet for Patients: SugarRoll.be Fact Sheet for Healthcare Providers: https://www.woods-mathews.com/ This test is not yet approved or cleared by the [place] FDA and  has been authorized for detection and/or diagnosis of SARS-CoV-2 by FDA under an Emergency Use Authorization (EUA). This EUA will remain  in effect (meaning this test can be used) for the duration of the COVID-19 declaration under Section 56 4(b)(1) of the Act, 21 U.S.C. section 360bbb-3(b)(1), unless the authorization is terminated or revoked sooner. Performed at [facility], [place] [address], [place], [place] [address]   Creatinine, urine, random     Status: None   Collection Time: 02/15/19 12:40 PM  Result Value Ref Range   Creatinine, Urine 41.24 mg/dL    Comment: Performed at [facility] [address], [place], [place] [address]  TSH     Status: Abnormal   Collection Time: 02/15/19 12:43 PM  Result Value Ref Range   TSH 6.560 (H) 0.350 - 4.500 uIU/mL    Comment: Performed by a 3rd Generation assay with a functional sensitivity of <=0.01 uIU/mL. Performed at [facility], [place] [address], [place], [place] [address]   Hemoglobin A1c     Status: Abnormal   Collection Time: 02/15/19 12:43 PM  Result Value Ref Range   Hgb A1c MFr Bld 6.4 (H) 4.8 - 5.6 %    Comment: (NOTE) Pre diabetes:          5.7%-6.4% Diabetes:              >6.4% Glycemic control for   <7.0% adults with diabetes    Mean Plasma Glucose 136.98 mg/dL    Comment: Performed at [facility] [address] [place], [place] [address]  Lipid panel     Status: Abnormal    Collection Time: 02/15/19 12:43 PM  Result Value Ref Range   Cholesterol 134 0 - 200 mg/dL   Triglycerides 85 <150 mg/dL   HDL 38 (L) >40 mg/dL   Total CHOL/HDL Ratio 3.5 RATIO   VLDL 17 0 - 40 mg/dL   LDL Cholesterol 79 0 - 99 mg/dL    Comment:        Total Cholesterol/HDL:CHD Risk Coronary Heart Disease Risk Table                     Men   Women  1/2 Average Risk   3.4   3.3  Average Risk       5.0   4.4  2 X Average  Risk   9.6   7.1  3 X Average Risk  23.4   11.0        Use the calculated Patient Ratio above and the CHD Risk Table to determine the patient's CHD Risk.        ATP III CLASSIFICATION (LDL):  <100     mg/dL   Optimal  100-129  mg/dL   Near or Above                    Optimal  130-159  mg/dL   Borderline  160-189  mg/dL   High  >190     mg/dL   Very High Performed at [facility] [address], [place], [place] [address]     CT Head Wo Contrast  Result Date: 02/15/2019 CLINICAL DATA:  Fall EXAM: CT HEAD WITHOUT CONTRAST TECHNIQUE: Contiguous axial images were obtained from the base of the skull through the vertex without intravenous contrast. COMPARISON:  None. FINDINGS: Brain: L no intracranial hemorrhage or extra-axial collection. There is advanced white matter disease consistent with chronic small vessel ischemia. There is an old right PCA territory infarct. There is a colloid cyst at the level of the foramina of [place] without hydrocephalus. Vascular: Atherosclerotic calcification of the internal carotid and vertebral arteries at the skull base Skull: Normal. Negative for fracture or focal lesion. Sinuses/Orbits: No acute finding. Other: None. IMPRESSION: 1. No acute intracranial abnormality. 2. Advanced chronic small vessel ischemia and old right PCA territory infarct. 3. Small colloid cyst at the level of the foramina of [place] without hydrocephalus. Electronically Signed   By: Ulyses Jarred M.D.   On: 02/15/2019 01:42   US RENAL  Result Date:  02/15/2019 CLINICAL DATA:  Acute on chronic renal failure EXAM: RENAL / URINARY TRACT ULTRASOUND COMPLETE COMPARISON:  February 13, 2012 FINDINGS: Right Kidney: Renal measurements: 10.8 x 4.7 x 5.2 cm = volume: 138.2 mL. Contains a 2.3 cm septated cyst with no nodular components. Also contains a 2.5 cm simple cyst. Increased cortical echogenicity. Left Kidney: Renal measurements: 10.4 x 5.1 x 5.2 cm = volume: 145 mL. Contains renal cysts. No hydronephrosis. Increased cortical echogenicity. Bladder: Appears normal for degree of bladder distention. Other: Ascites. Small right pleural effusion. A small amount of perinephric fluid is seen on the left. IMPRESSION: 1. Bilateral renal cysts.  No hydronephrosis. 2. Medical renal disease. 3. Mild perinephric fluid on the left is nonspecific given ascites elsewhere. 4. Right pleural effusion. Electronically Signed   By: Dorise Bullion III M.D   On: 02/15/2019 14:00    Assessment/Plan **acute on chronic CHF: being admitted for diuresis as she's clearly volume overloaded.  Cardiology following. Lasix 80 IV TID + metolazone 5 daily.  I'm OK with aldactone 12.5 daily in the inpatient setting where she can be monitored closely, but I think if renal function doesn't improve at least to baseline it may not be a good idea outpatient.  Low na/fluid restrict, I/Os, daily wts.   **AKI on CKD: fairly advanced CKD at baseline with Cr 2.2-2.5, now 2.8.  Suspect this is a cardiorenal type picture.  Ok with diuresis but will follow closely as wish to avoid over diuresis given tenuous status.  I would not recommend initiation of long term dialysis given her comorbidities.  I discontinued the fleets enema as the phosphate load can precipitate AKI.    **HTN: normotensive, low threshold to hold meds to prevent hypotension.    **Possible UTI: culture  pending  **A fib: on eliquis, metop.   Justin Mend 02/15/2019, 3:29 PM

## 2019-02-16 ENCOUNTER — Inpatient Hospital Stay (HOSPITAL_COMMUNITY): Payer: Medicare Other

## 2019-02-16 DIAGNOSIS — N189 Chronic kidney disease, unspecified: Secondary | ICD-10-CM

## 2019-02-16 DIAGNOSIS — R188 Other ascites: Secondary | ICD-10-CM

## 2019-02-16 DIAGNOSIS — I482 Chronic atrial fibrillation, unspecified: Secondary | ICD-10-CM

## 2019-02-16 DIAGNOSIS — Z515 Encounter for palliative care: Secondary | ICD-10-CM

## 2019-02-16 DIAGNOSIS — Z7189 Other specified counseling: Secondary | ICD-10-CM

## 2019-02-16 DIAGNOSIS — N289 Disorder of kidney and ureter, unspecified: Secondary | ICD-10-CM

## 2019-02-16 DIAGNOSIS — N184 Chronic kidney disease, stage 4 (severe): Secondary | ICD-10-CM

## 2019-02-16 LAB — PREPARE RBC (CROSSMATCH)

## 2019-02-16 LAB — BASIC METABOLIC PANEL
Anion gap: 12 (ref 5–15)
BUN: 122 mg/dL — ABNORMAL HIGH (ref 8–23)
CO2: 29 mmol/L (ref 22–32)
Calcium: 9 mg/dL (ref 8.9–10.3)
Chloride: 97 mmol/L — ABNORMAL LOW (ref 98–111)
Creatinine, Ser: 3.1 mg/dL — ABNORMAL HIGH (ref 0.44–1.00)
GFR calc Af Amer: 15 mL/min — ABNORMAL LOW (ref >60)
GFR calc non Af Amer: 13 mL/min — ABNORMAL LOW (ref >60)
Glucose, Bld: 122 mg/dL — ABNORMAL HIGH (ref 70–99)
Potassium: 4.1 mmol/L (ref 3.5–5.1)
Sodium: 138 mmol/L (ref 135–145)

## 2019-02-16 LAB — CBC WITH DIFFERENTIAL/PLATELET
Abs Immature Granulocytes: 0.03 10*3/uL (ref 0.00–0.07)
Basophils Absolute: 0.1 10*3/uL (ref 0.0–0.1)
Basophils Relative: 1 %
Eosinophils Absolute: 0.4 10*3/uL (ref 0.0–0.5)
Eosinophils Relative: 5 %
HCT: 24.5 % — ABNORMAL LOW (ref 36.0–46.0)
Hemoglobin: 7.4 g/dL — ABNORMAL LOW (ref 12.0–15.0)
Immature Granulocytes: 0 %
Lymphocytes Relative: 20 %
Lymphs Abs: 1.6 10*3/uL (ref 0.7–4.0)
MCH: 24.9 pg — ABNORMAL LOW (ref 26.0–34.0)
MCHC: 30.2 g/dL (ref 30.0–36.0)
MCV: 82.5 fL (ref 80.0–100.0)
Monocytes Absolute: 1.1 10*3/uL — ABNORMAL HIGH (ref 0.1–1.0)
Monocytes Relative: 14 %
Neutro Abs: 4.7 10*3/uL (ref 1.7–7.7)
Neutrophils Relative %: 60 %
Platelets: 219 10*3/uL (ref 150–400)
RBC: 2.97 MIL/uL — ABNORMAL LOW (ref 3.87–5.11)
RDW: 17.4 % — ABNORMAL HIGH (ref 11.5–15.5)
WBC: 7.9 10*3/uL (ref 4.0–10.5)
nRBC: 0 % (ref 0.0–0.2)

## 2019-02-16 LAB — URINE CULTURE: Culture: NO GROWTH

## 2019-02-16 LAB — CBC
HCT: 24.3 % — ABNORMAL LOW (ref 36.0–46.0)
Hemoglobin: 7.5 g/dL — ABNORMAL LOW (ref 12.0–15.0)
MCH: 25.4 pg — ABNORMAL LOW (ref 26.0–34.0)
MCHC: 30.9 g/dL (ref 30.0–36.0)
MCV: 82.4 fL (ref 80.0–100.0)
Platelets: 214 10*3/uL (ref 150–400)
RBC: 2.95 MIL/uL — ABNORMAL LOW (ref 3.87–5.11)
RDW: 17.5 % — ABNORMAL HIGH (ref 11.5–15.5)
WBC: 8.8 10*3/uL (ref 4.0–10.5)
nRBC: 0 % (ref 0.0–0.2)

## 2019-02-16 LAB — UREA NITROGEN, URINE: Urea Nitrogen, Ur: 358 mg/dL

## 2019-02-16 MED ORDER — BISACODYL 10 MG RE SUPP
10.0000 mg | Freq: Once | RECTAL | Status: DC
  Filled 2019-02-16: qty 1

## 2019-02-16 MED ORDER — DICLOFENAC SODIUM 1 % EX GEL
2.0000 g | CUTANEOUS | Status: DC | PRN
  Administered 2019-02-16 – 2019-02-20 (×3): 2 g via TOPICAL
  Filled 2019-02-16: qty 100

## 2019-02-16 MED ORDER — SODIUM CHLORIDE 0.9% IV SOLUTION: Freq: Once | INTRAVENOUS | Status: DC

## 2019-02-16 MED ORDER — FUROSEMIDE 10 MG/ML IJ SOLN
80.0000 mg | Freq: Three times a day (TID) | INTRAMUSCULAR | Status: DC
  Administered 2019-02-16 – 2019-02-19 (×10): 80 mg via INTRAVENOUS
  Filled 2019-02-16 (×10): qty 8

## 2019-02-16 MED ORDER — BISACODYL 10 MG RE SUPP
10.0000 mg | Freq: Once | RECTAL | Status: AC
  Administered 2019-02-17: 10 mg via RECTAL
  Filled 2019-02-16: qty 1

## 2019-02-16 MED ORDER — TRAMADOL HCL 50 MG PO TABS
50.0000 mg | ORAL_TABLET | Freq: Once | ORAL | Status: AC
  Administered 2019-02-16: 50 mg via ORAL
  Filled 2019-02-16: qty 1

## 2019-02-16 MED ORDER — LIDOCAINE 5 % EX PTCH
1.0000 | MEDICATED_PATCH | Freq: Every day | CUTANEOUS | Status: DC | PRN
  Administered 2019-02-16 – 2019-02-18 (×3): 1 via TRANSDERMAL
  Filled 2019-02-16 (×3): qty 1

## 2019-02-16 MED ORDER — ISOSORB DINITRATE-HYDRALAZINE 20-37.5 MG PO TABS
1.0000 | ORAL_TABLET | Freq: Three times a day (TID) | ORAL | Status: DC
  Administered 2019-02-16 – 2019-02-20 (×12): 1 via ORAL
  Filled 2019-02-16 (×12): qty 1

## 2019-02-16 MED ORDER — POLYETHYLENE GLYCOL 3350 17 G PO PACK
17.0000 g | PACK | Freq: Two times a day (BID) | ORAL | Status: DC
  Administered 2019-02-16 – 2019-02-18 (×5): 17 g via ORAL
  Filled 2019-02-16 (×7): qty 1

## 2019-02-16 MED ORDER — METOLAZONE 5 MG PO TABS
5.0000 mg | ORAL_TABLET | Freq: Every day | ORAL | Status: DC
  Administered 2019-02-17 – 2019-02-20 (×4): 5 mg via ORAL
  Filled 2019-02-16 (×4): qty 1

## 2019-02-16 NOTE — Evaluation (Signed)
Physical Therapy Evaluation Patient Details Name: Jodi [name] MRN: [id number] DOB: May 22, 1932 Today's Date: 02/16/2019   History of Present Illness  86yo female with history of recent falls, increased SOB, BLE and abdominal edema. CT head negative. Admitted for frequent falls, acute on chronic CHF with severe aortic stenosis. PMH CVA/TIA, A-fib, MI, macular degeneration, HTN, HLD, CHF, aortic stenosis, CABG, cardiac cath, cardioversion  Clinical Impression   Patient received in bed, pleasant and cooperative but complaining of R shoulder pain- RN made aware of request for Voltarin gel. Able to complete bed mobility with S, otherwise only required min guard/RW for safety during session; cardiac leads not staying on patient so signal unreliable, SPO2 did drop into 80s with gait on room air but also questions reliability of this signal as well. All vitals stabilized once she was up in the chair. She was left up in chair with all needs met, alarm active. Feel that she could likely return home with 24/7A from family and skilled HHPT services.     Follow Up Recommendations Home health PT;Supervision/Assistance - 24 hour    Equipment Recommendations  3in1 (PT);Rolling walker with 5" wheels    Recommendations for Other Services       Precautions / Restrictions Precautions Precautions: Fall;Other (comment) Precaution Comments: watch HR/rhythm and SPO2, R shoulder pain Restrictions Weight Bearing Restrictions: No      Mobility  Bed Mobility Overal bed mobility: Needs Assistance Bed Mobility: Supine to Sit     Supine to sit: Supervision     General bed mobility comments: S for safety, no physical assist given  Transfers Overall transfer level: Needs assistance Equipment used: Rolling walker (2 wheeled) Transfers: Sit to/from Stand Sit to Stand: Min guard         General transfer comment: min guard for safety/no physical assist, cues for hand placement and  safety  Ambulation/Gait Ambulation/Gait assistance: Min guard Gait Distance (Feet): 60 Feet Assistive device: Rolling walker (2 wheeled) Gait Pattern/deviations: Step-through pattern;Decreased step length - right;Decreased step length - left;Decreased stride length;Trunk flexed Gait velocity: decreased   General Gait Details: slow but steady with RW, leads not staying on patient so HR unreliable but SpO2 drop to 80% on room air and fluctuated throughout session while ambulating, recovered back to 90s with seated rest  Stairs            Wheelchair Mobility    Modified Rankin (Stroke Patients Only)       Balance Overall balance assessment: Needs assistance;History of Falls Sitting-balance support: Bilateral upper extremity supported;Feet supported Sitting balance-Leahy Scale: Good     Standing balance support: Bilateral upper extremity supported;During functional activity Standing balance-Leahy Scale: Fair Standing balance comment: reilant on BUE support                             Pertinent Vitals/Pain Pain Assessment: Faces Faces Pain Scale: Hurts even more Pain Location: R shoulder pain Pain Descriptors / Indicators: Aching;Sore Pain Intervention(s): Limited activity within patient's tolerance;Monitored during session;Patient requesting pain meds-RN notified    Home Living Family/patient expects to be discharged to:: Private residence Living Arrangements: Children Available Help at Discharge: Family;Available 24 hours/day Type of Home: House Home Access: Stairs to enter   [employer] of Steps: 3 in the back, 0 in the front Home Layout: One level Home Equipment: Walker - 4 wheels;Kasandra Knudsen - single point Additional Comments: now living with daughter but wants to return home; information is  for daughter's home, not patient's    Prior Function Level of Independence: Independent with assistive device(s)         Comments: was on a rollator  most recently, has had 5 falls in the past 3 months     Hand Dominance        Extremity/Trunk Assessment   Upper Extremity Assessment Upper Extremity Assessment: Defer to OT evaluation    Lower Extremity Assessment Lower Extremity Assessment: Generalized weakness    Cervical / Trunk Assessment Cervical / Trunk Assessment: Kyphotic  Communication   Communication: HOH  Cognition Arousal/Alertness: Awake/alert Behavior During Therapy: WFL for tasks assessed/performed Overall Cognitive Status: Within Functional Limits for tasks assessed                                 General Comments: cognition appears intact, HOH which did limit communication slightly      General Comments      Exercises     Assessment/Plan    PT Assessment Patient needs continued PT services  PT Problem List Decreased strength;Decreased knowledge of use of DME;Decreased activity tolerance;Decreased safety awareness;Decreased balance;Decreased mobility;Decreased coordination       PT Treatment Interventions DME instruction;Balance training;Gait training;Neuromuscular re-education;Stair training;Functional mobility training;Patient/family education;Therapeutic activities;Therapeutic exercise    PT Goals (Current goals can be found in the Care Plan section)  Acute Rehab PT Goals Patient Stated Goal: less shoulder pain PT Goal Formulation: With patient Time For Goal Achievement: 03/02/19 Potential to Achieve Goals: Good    Frequency Min 3X/week   Barriers to discharge        Co-evaluation               AM-PAC PT "6 Clicks" Mobility  Outcome Measure Help needed turning from your back to your side while in a flat bed without using bedrails?: None Help needed moving from lying on your back to sitting on the side of a flat bed without using bedrails?: None Help needed moving to and from a bed to a chair (including a wheelchair)?: A Little Help needed standing up from a chair  using your arms (e.g., wheelchair or bedside chair)?: A Little Help needed to walk in hospital room?: A Little Help needed climbing 3-5 steps with a railing? : A Lot 6 Click Score: 19    End of Session Equipment Utilized During Treatment: Gait belt Activity Tolerance: Patient tolerated treatment well;Patient limited by fatigue Patient left: in chair;with call bell/phone within reach;with chair alarm set Nurse Communication: Mobility status;Other (comment)(leads not staying on patient/pt requesting Voltarin gel for her shoulder) PT Visit Diagnosis: Unsteadiness on feet (R26.81);Muscle weakness (generalized) (M62.81);History of falling (Z91.81)    Time: 6433-2951 PT Time Calculation (min) (ACUTE ONLY): 25 min   Charges:   PT Evaluation $PT Eval Low Complexity: 1 Low PT Treatments $Gait Training: 8-22 mins        Windell Norfolk, DPT, PN1   Supplemental Physical Therapist [place]    Pager [phone] Acute Rehab Office [phone]

## 2019-02-16 NOTE — Progress Notes (Signed)
Subjective:  Feels tired. Still constipated.   Intake/Output from previous day:  I/O last 3 completed shifts: In: 240 [P.O.:240] Out: 900 [Urine:900] No intake/output data recorded.  Blood pressure (!) 117/47, pulse (!) 59, temperature 98 F (36.7 C), temperature source Oral, resp. rate 19, height 5' 3"  (1.6 m), weight 74.6 kg, SpO2 96 %. Physical Exam  Constitutional: She appears well-developed and well-nourished. No distress.  HENT:  Head: Atraumatic.  Eyes: Conjunctivae are normal.  Neck: No thyromegaly present.  Cardiovascular: Intact distal pulses and normal pulses. An irregularly irregular rhythm present. Exam reveals no gallop, no S3 and no S4.  Murmur heard.  Harsh mid to late systolic murmur is present with a grade of 3/6 at the upper right sternal border radiating to the neck. High-pitched blowing holosystolic murmur is also present at the apex. Pulses:      Carotid pulses are on the right side with bruit and on the left side with bruit. S1 is variable and soft, S2 is muffled. JVD Half way up and 1 to 2 plus ankle edema  Pulmonary/Chest: Effort normal and breath sounds normal.  Abdominal: Soft. Bowel sounds are normal. She exhibits distension. There is no abdominal tenderness.  Musculoskeletal:        General: Normal range of motion.     Cervical back: Neck supple.  Neurological: She is alert.  Skin: Skin is warm and dry.  Psychiatric: She has a normal mood and affect.   Lab Results: BMP BNP (last 3 results) Recent Labs    10/22/18 1639 12/23/18 1854 02/15/19 0004  BNP 791.1* 1,158.0* 1,319.8*    ProBNP (last 3 results) No results for input(s): PROBNP in the last 8760 hours. BMP Latest Ref Rng & Units 02/16/2019 02/15/2019 02/03/2019  Glucose 70 - 99 mg/dL 122(H) 121(H) 141(H)  BUN 8 - 23 mg/dL 122(H) 115(H) 96(H)  Creatinine 0.44 - 1.00 mg/dL 3.10(H) 2.82(H) 2.46(H)  BUN/Creat Ratio 12 - 28 - - -  Sodium 135 - 145 mmol/L 138 137 137  Potassium 3.5 - 5.1  mmol/L 4.1 4.3 3.3(L)  Chloride 98 - 111 mmol/L 97(L) 95(L) 96(L)  CO2 22 - 32 mmol/L 29 28 27   Calcium 8.9 - 10.3 mg/dL 9.0 9.0 10.3   Hepatic Function Latest Ref Rng & Units 02/15/2019 12/04/2016 08/25/2016  Total Protein 6.5 - 8.1 g/dL 6.3(L) 6.8 7.1  Albumin 3.5 - 5.0 g/dL 3.6 4.1 4.3  AST 15 - 41 U/L 21 29 34  ALT 0 - 44 U/L 14 15 24   Alk Phosphatase 38 - 126 U/L 77 80 60  Total Bilirubin 0.3 - 1.2 mg/dL 1.0 1.4(H) 1.7(H)   CBC Latest Ref Rng & Units 02/16/2019 02/15/2019 02/03/2019  WBC 4.0 - 10.5 K/uL 8.8 9.3 8.3  Hemoglobin 12.0 - 15.0 g/dL 7.5(L) 8.0(L) 8.8(L)  Hematocrit 36.0 - 46.0 % 24.3(L) 27.4(L) 29.0(L)  Platelets 150 - 400 K/uL 214 230 196   Lipid Panel     Component Value Date/Time   CHOL 134 02/15/2019 1243   TRIG 85 02/15/2019 1243   HDL 38 (L) 02/15/2019 1243   CHOLHDL 3.5 02/15/2019 1243   VLDL 17 02/15/2019 1243   LDLCALC 79 02/15/2019 1243   LDLDIRECT 79 10/22/2011 1448   Cardiac Panel (last 3 results) No results for input(s): CKTOTAL, CKMB, TROPONINI, RELINDX in the last 72 hours.  HEMOGLOBIN A1C Lab Results  Component Value Date   HGBA1C 6.4 (H) 02/15/2019   MPG 136.98 02/15/2019   TSH Recent Labs  08/28/18 0412 10/08/18 1139 02/15/19 1243  TSH 7.322* 6.769* 6.560*   Imaging: US RENAL  Result Date: 02/15/2019 CLINICAL DATA:  Acute on chronic renal failure EXAM: RENAL / URINARY TRACT ULTRASOUND COMPLETE COMPARISON:  February 13, 2012 FINDINGS: Right Kidney: Renal measurements: 10.8 x 4.7 x 5.2 cm = volume: 138.2 mL. Contains a 2.3 cm septated cyst with no nodular components. Also contains a 2.5 cm simple cyst. Increased cortical echogenicity. Left Kidney: Renal measurements: 10.4 x 5.1 x 5.2 cm = volume: 145 mL. Contains renal cysts. No hydronephrosis. Increased cortical echogenicity. Bladder: Appears normal for degree of bladder distention. Other: Ascites. Small right pleural effusion. A small amount of perinephric fluid is seen on the left.  IMPRESSION: 1. Bilateral renal cysts.  No hydronephrosis. 2. Medical renal disease. 3. Mild perinephric fluid on the left is nonspecific given ascites elsewhere. 4. Right pleural effusion. Electronically Signed   By: Dorise Bullion III M.D   On: 02/15/2019 14:00   US Abdomen Limited  Result Date: 02/16/2019 CLINICAL DATA:  Abdominal distension. EXAM: LIMITED ABDOMEN ULTRASOUND FOR ASCITES TECHNIQUE: Limited ultrasound survey for ascites was performed in all four abdominal quadrants. COMPARISON:  None. FINDINGS: Small ascites visualized in all 4 quadrants of the abdomen. IMPRESSION: Small ascites. Electronically Signed   By: Titus Dubin M.D.   On: 02/16/2019 13:35    Cardiac Studies:  Echocardiogram 08/26/2018:  1. The left ventricle has moderately reduced systolic function, with an ejection fraction of 35-40%. There is moderately increased left ventricular wall thickness. Indeterminated due to mitral apparatus calcification. There is abnormal septal motion  consistent with post-operative status. Left ventricular diffuse hypokinesis. Severe hypokinesis of the left ventricular, entire inferior wall. Severe hypokinesis of the left ventricular, entire inferoseptal wall. 2. The right ventricle has moderately reduced systolic function. The cavity was moderately enlarged. There is no increase in right ventricular wall thickness. 3. Left atrial size was severely dilated. 4. The mitral valve is degenerative. Moderate thickening of the mitral valve leaflet. Moderate calcification of the mitral valve leaflet. Mitral valve regurgitation is mild to moderate by color flow Doppler. 5. The tricuspid valve is grossly normal. Tricuspid valve regurgitation is mild-moderate. Pulmonary hypertension is moderate. PA pressure 48 mm Hg (RA 15) 6. The aortic valve is tricuspid. Severely thickening of the aortic valve. Severe calcifcation of the aortic valve. Moderate-severe stenosis of the aortic valve. The  gradients may be underestimated due to poor signial and low LVEF. 7. There is evidence of mild plaque in the aortic root.  Nocturnal oximetry 03/07/2017: No significant hypoxemia.  left + right Heart catheterization 11/24/2013: Moderate pulmonary hypertension, PA pressure 46/18 mmHg. Cardiac output 3.96, Cardiac index 2.2. SVG to RCA ostial 90%, patent free RIMA to RI, SVG to OM1, LIMA to LAD. Aortic valve area 1.1 cm, Mean gradient of 14 mmHg. Moderate to severe mitral regu  Scheduled Meds: . sodium chloride   Intravenous Once  . apixaban  2.5 mg Oral BID  . bisacodyl  10 mg Rectal Once  . furosemide  80 mg Intravenous TID  . isosorbide-hydrALAZINE  1 tablet Oral TID  . metolazone  5 mg Oral Daily  . metoprolol tartrate  12.5 mg Oral BID  . multivitamin with minerals  1 tablet Oral Daily  . polyethylene glycol  17 g Oral BID  . rosuvastatin  20 mg Oral Daily  . vitamin B-12  1,000 mcg Oral Daily   Continuous Infusions: PRN Meds:.acetaminophen, albuterol, gabapentin  Assessment/Plan:  1.  Acute on  chronic systolic and diastolic heart failure involving both the right ventricle and left ventricle now predominantly right-sided heart failure with elevated JVD, ascites and also leg edema. 2.  Acute on chronic stage IV kidney disease, worsening serum creatinine. 3.  Failure to thrive in adult. 4.  Multivalve a heart disease including moderate to severe aortic stenosis and moderate to severe until regurgitation, not felt to be a surgical candidate. 5.  DO NOT RESUSCITATE status.   EKG 02/15/2019: Atrial fibrillation with controlled ventricular response at the rate of 66 bpm, inferior infarct old.  Poor R-wave progression, cannot exclude anteroseptal infarct old.  Nonspecific IVCD.  Nonspecific T abnormality. No significant change from  Prior EKG.  Recommendation: D/ W Dr. Darrelyn Hillock, agree with reducing BiDil to 1 tid in view of low BP to improve perfusion to the kidneys, continue  present dose diuretics. May need dobutamine infusion short term if no improvement in CHF.  Cardio-renal syndrome with low out put.  DNR status.  [name], M.D. 02/16/2019, 8:58 AM [facility] Pager: [phone]

## 2019-02-16 NOTE — Plan of Care (Signed)
  Problem: Clinical Measurements: Goal: Respiratory complications will improve Outcome: Progressing Goal: Cardiovascular complication will be avoided Outcome: Progressing   
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
## 2019-02-16 NOTE — Evaluation (Signed)
Occupational Therapy Evaluation Patient Details Name: Jodi [name] MRN: [id number] DOB: 12-20-1932 Today's Date: 02/16/2019    History of Present Illness 86yo female with history of recent falls, increased SOB, BLE and abdominal edema. CT head negative. Admitted for frequent falls, acute on chronic CHF with severe aortic stenosis. PMH CVA/TIA, A-fib, MI, macular degeneration, HTN, HLD, CHF, aortic stenosis, CABG, cardiac cath, cardioversion   Clinical Impression   Patient is an 86 year old female that typically lives alone in an apartment, but has been staying with daughter due to recent falls. When asked why patient believes she keeps falling pt describes losing her balance, denies dizziness. Patient also reports she does not always use her rollator. Educate patient on importance of AD use with functional ambulation and transfers to minimize risk of falls as it will increase her stability, pt verbalize understanding. Patient supervision level for transfers and sink side ADLs this session. Patient participate in functional ambulation using rolling walker with increased time and x2 standing rest breaks due to pain in R shoulder. Ice applied at end of session and encourage patient to discuss her shoulder pain with the doctor. Recommend continued acute OT services to maximize patient independence and safety with self care.      Follow Up Recommendations  Home health OT;Supervision/Assistance - 24 hour    Equipment Recommendations  3 in 1 bedside commode       Precautions / Restrictions Precautions Precautions: Fall Precaution Comments: watch HR/rhythm and SPO2, R shoulder pain Restrictions Weight Bearing Restrictions: No      Mobility Bed Mobility Overal bed mobility: Needs Assistance Bed Mobility: Supine to Sit;Sit to Supine     Supine to sit: Min assist;HOB elevated Sit to supine: Supervision   General bed mobility comments: min A for trunk support  Transfers Overall  transfer level: Needs assistance Equipment used: Rolling walker (2 wheeled) Transfers: Sit to/from Stand Sit to Stand: Supervision         General transfer comment: min guard for safety/no physical assist, cues for hand placement and safety    Balance Overall balance assessment: Needs assistance;History of Falls Sitting-balance support: No upper extremity supported;Feet supported Sitting balance-Leahy Scale: Good     Standing balance support: Bilateral upper extremity supported;During functional activity Standing balance-Leahy Scale: Poor Standing balance comment: reilant on BUE support                           ADL either performed or assessed with clinical judgement   ADL Overall ADL's : Needs assistance/impaired Eating/Feeding: Independent;Sitting   Grooming: Supervision/safety;Wash/dry face;Wash/dry hands;Oral care;Standing Grooming Details (indicate cue type and reason): holds onto counter for support Upper Body Bathing: Minimal assistance;Sitting Upper Body Bathing Details (indicate cue type and reason): due to R shoulder pain Lower Body Bathing: Minimal assistance;Moderate assistance;Sitting/lateral leans;Sit to/from stand   Upper Body Dressing : Minimal assistance;Sitting Upper Body Dressing Details (indicate cue type and reason): due to R shoulder pain Lower Body Dressing: Moderate assistance;Minimal assistance   Toilet Transfer: Chief Operating Officer;Ambulation Toilet Transfer Details (indicate cue type and reason): simulated with functional mobility, patient able to power up without physical assist, supervision for safety managing lines Toileting- Clothing Manipulation and Hygiene: Supervision/safety;Min guard;Sit to/from stand;Sitting/lateral lean       Functional mobility during ADLs: Supervision/safety;Rolling walker General ADL Comments: due to acute pain in R shoulder patient requiring increased assistance, patient reports she lets  her daughter help her "with some things" but is usually  pretty independent                  Pertinent Vitals/Pain Pain Assessment: Faces Faces Pain Scale: Hurts even more Pain Location: R shoulder pain Pain Descriptors / Indicators: Aching;Sore Pain Intervention(s): Limited activity within patient's tolerance;Monitored during session;Ice applied     Hand Dominance Right   Extremity/Trunk Assessment Upper Extremity Assessment Upper Extremity Assessment: RUE deficits/detail;Generalized weakness RUE Deficits / Details: AROM appear WFL  RUE: Unable to fully assess due to pain   Lower Extremity Assessment Lower Extremity Assessment: Defer to PT evaluation   Cervical / Trunk Assessment Cervical / Trunk Assessment: Kyphotic   Communication Communication Communication: HOH   Cognition Arousal/Alertness: Awake/alert Behavior During Therapy: WFL for tasks assessed/performed Overall Cognitive Status: Within Functional Limits for tasks assessed                                 General Comments: cognition appears intact, HOH which did limit communication slightly   General Comments  VSS on room air            Home Living Family/patient expects to be discharged to:: Private residence Living Arrangements: Children Available Help at Discharge: Family;Available PRN/intermittently Type of Home: Apartment Home Access: Level entry Entrance Stairs-Number of Steps: 3 in the back, 0 in the front   Home Layout: One level     Bathroom Shower/Tub: Teacher, early years/pre: Handicapped height     Home Equipment: Environmental consultant - 4 wheels;Cane - single point   Additional Comments: patient information provided for patient's home, patient reports her DTR isn't always home as she has to take her granddaughter to the doctor's often.       Prior Functioning/Environment Level of Independence: Independent with assistive device(s)        Comments: was on a rollator  most recently, has had 5 falls in the past 3 months        OT Problem List: Decreased activity tolerance;Decreased strength;Decreased safety awareness;Decreased knowledge of use of DME or AE;Pain;Impaired balance (sitting and/or standing)      OT Treatment/Interventions: Self-care/ADL training;Therapeutic exercise;DME and/or AE instruction;Therapeutic activities;Patient/family education;Balance training    OT Goals(Current goals can be found in the care plan section) Acute Rehab OT Goals Patient Stated Goal: less shoulder pain OT Goal Formulation: With patient Time For Goal Achievement: 03/02/19 Potential to Achieve Goals: Good  OT Frequency: Min 2X/week    AM-PAC OT "6 Clicks" Daily Activity     Outcome Measure Help from another person eating meals?: None Help from another person taking care of personal grooming?: A Little Help from another person toileting, which includes using toliet, bedpan, or urinal?: A Little Help from another person bathing (including washing, rinsing, drying)?: A Lot Help from another person to put on and taking off regular upper body clothing?: A Little Help from another person to put on and taking off regular lower body clothing?: A Lot 6 Click Score: 17   End of Session Equipment Utilized During Treatment: Rolling walker Nurse Communication: Mobility status  Activity Tolerance: Patient tolerated treatment well Patient left: in bed;with call bell/phone within reach;with bed alarm set  OT Visit Diagnosis: Unsteadiness on feet (R26.81);History of falling (Z91.81);Pain Pain - Right/Left: Right Pain - part of body: Shoulder                Time: 0867-6195 OT Time Calculation (min): 33 min Charges:  OT General  Charges $OT Visit: 1 Visit OT Evaluation $OT Eval Moderate Complexity: 1 Mod OT Treatments $Self Care/Home Management : 8-22 mins  Shon Millet OT OT office: [place] 02/16/2019, 12:11 PM

## 2019-02-16 NOTE — Progress Notes (Signed)
Subjective:  C/o shoulder pain now s/p voltaren gel with some relief.  900 of UOP recorded- soft BP-  BUN and crt slightly worse    Objective Vital signs in last 24 hours: Vitals:   02/15/19 2105 02/16/19 0031 02/16/19 0512 02/16/19 0837  BP: (!) 120/54 (!) 112/53 (!) 104/48 (!) 117/47  Pulse: 69 70 (!) 57 (!) 59  Resp: 18  20 19   Temp: 97.9 F (36.6 C)  98 F (36.7 C)   TempSrc: Oral  Oral   SpO2: 97%  96% 96%  Weight:   74.6 kg   Height:       Weight change:   Intake/Output Summary (Last 24 hours) at 02/16/2019 1238 Last data filed at 02/15/2019 2030 Gross per 24 hour  Intake 240 ml  Output 900 ml  Net -660 ml    Assessment/ Plan: Pt is a 86 y.o. yo female with CAD, AS, MR combined CHF and stage 3 CKD (crt in the mid 2's) who was admitted on 02/15/2019 with falls and volume overload  Assessment/Plan: 1. Renal-  A on CRF- U/A bland- likely cardiorenal pathology given many cardiac issues.  Some UOP but with worsening renal failure.  Unfortunately I do not think would be a candidate for dialysis therapy due to cardiac comorbidities- would not tolerate.  I was unable to fully discuss this with the patient today.  I agree with palliative care to address [place] with the patient and family 2. HTN/vol--- overloaded but with soft BP - low dose lopressor and IV lasix-  To continue 3. Anemia-  Worsening-  Will check iron stores and give ESA 4. CV-  Combined CHF and valvular issues that dont appear correctable- complicating issue    Louis Meckel    Labs: Basic Metabolic Panel: Recent Labs  Lab 02/15/19 0004 02/16/19 0342  NA 137 138  K 4.3 4.1  CL 95* 97*  CO2 28 29  GLUCOSE 121* 122*  BUN 115* 122*  CREATININE 2.82* 3.10*  CALCIUM 9.0 9.0   Liver Function Tests: Recent Labs  Lab 02/15/19 0004  AST 21  ALT 14  ALKPHOS 77  BILITOT 1.0  PROT 6.3*  ALBUMIN 3.6   No results for input(s): LIPASE, AMYLASE in the last 168 hours. No results for input(s): AMMONIA  in the last 168 hours. CBC: Recent Labs  Lab 02/15/19 0004 02/16/19 0342  WBC 9.3 8.8  NEUTROABS 5.0  --   HGB 8.0* 7.5*  HCT 27.4* 24.3*  MCV 85.9 82.4  PLT 230 214   Cardiac Enzymes: No results for input(s): CKTOTAL, CKMB, CKMBINDEX, TROPONINI in the last 168 hours. CBG: No results for input(s): GLUCAP in the last 168 hours.  Iron Studies: No results for input(s): IRON, TIBC, TRANSFERRIN, FERRITIN in the last 72 hours. Studies/Results: CT Head Wo Contrast  Result Date: 02/15/2019 CLINICAL DATA:  Fall EXAM: CT HEAD WITHOUT CONTRAST TECHNIQUE: Contiguous axial images were obtained from the base of the skull through the vertex without intravenous contrast. COMPARISON:  None. FINDINGS: Brain: L no intracranial hemorrhage or extra-axial collection. There is advanced white matter disease consistent with chronic small vessel ischemia. There is an old right PCA territory infarct. There is a colloid cyst at the level of the foramina of [place] without hydrocephalus. Vascular: Atherosclerotic calcification of the internal carotid and vertebral arteries at the skull base Skull: Normal. Negative for fracture or focal lesion. Sinuses/Orbits: No acute finding. Other: None. IMPRESSION: 1. No acute intracranial abnormality. 2. Advanced chronic small  vessel ischemia and old right PCA territory infarct. 3. Small colloid cyst at the level of the foramina of [place] without hydrocephalus. Electronically Signed   By: Ulyses Jarred M.D.   On: 02/15/2019 01:42   US RENAL  Result Date: 02/15/2019 CLINICAL DATA:  Acute on chronic renal failure EXAM: RENAL / URINARY TRACT ULTRASOUND COMPLETE COMPARISON:  February 13, 2012 FINDINGS: Right Kidney: Renal measurements: 10.8 x 4.7 x 5.2 cm = volume: 138.2 mL. Contains a 2.3 cm septated cyst with no nodular components. Also contains a 2.5 cm simple cyst. Increased cortical echogenicity. Left Kidney: Renal measurements: 10.4 x 5.1 x 5.2 cm = volume: 145 mL. Contains renal  cysts. No hydronephrosis. Increased cortical echogenicity. Bladder: Appears normal for degree of bladder distention. Other: Ascites. Small right pleural effusion. A small amount of perinephric fluid is seen on the left. IMPRESSION: 1. Bilateral renal cysts.  No hydronephrosis. 2. Medical renal disease. 3. Mild perinephric fluid on the left is nonspecific given ascites elsewhere. 4. Right pleural effusion. Electronically Signed   By: Dorise Bullion III M.D   On: 02/15/2019 14:00   Medications: Infusions:   Scheduled Medications: . apixaban  2.5 mg Oral BID  . bisacodyl  10 mg Rectal Once  . metoprolol tartrate  12.5 mg Oral BID  . multivitamin with minerals  1 tablet Oral Daily  . polyethylene glycol  17 g Oral Daily  . rosuvastatin  20 mg Oral Daily  . vitamin B-12  1,000 mcg Oral Daily    have reviewed scheduled and prn medications.  Physical Exam: General: alert but HOH-  Having to repeat myself-  Says feels OK  Heart: RRR with murmers Lungs: ant clear Abdomen: soft, non tender Extremities: pitting edema     02/16/2019,12:38 PM  LOS: 1 day

## 2019-02-16 NOTE — Progress Notes (Signed)
Will cosign resident's note once it is completed.  She denies any new concerns. I discussed her CT head report with her regarding the Colloid cyst. She said she is not interested in surgical treatment and monitoring for now.  Combined diastolic/systolic CHF: S/P Lasix 80 mg TID with Aldactone and Metalozone. BP dropped, hence meds were held. Monitor BP closely, may add meds if BP trend up. Cards following.  Ascites/Abdominal distension Obtain U/S to confirm size, location. ?? IR for paracentesis.   HTN: Now, with low-level BP. We are holding meds. Monitor closely and adjust meds as needed.  Acute on Chronic Kidney Disease IV: Worsened with dialysis. Holding Lasix. Nephro on board. Monitor kidney functions closely.  Acute on Chronic normocytic anemia: ?? Bleed. Hemoglobin dropped to 7.5. Repeat if still low transfuse her. Transfusion threshold is less than 8 Consider holding Eliquis. Recent FOBT was negative. Might need to recheck.

## 2019-02-17 ENCOUNTER — Inpatient Hospital Stay (HOSPITAL_COMMUNITY): Payer: Medicare Other

## 2019-02-17 DIAGNOSIS — Z66 Do not resuscitate: Secondary | ICD-10-CM

## 2019-02-17 DIAGNOSIS — R296 Repeated falls: Secondary | ICD-10-CM

## 2019-02-17 LAB — TYPE AND SCREEN
ABO/RH(D): B POS
Antibody Screen: NEGATIVE
Unit division: 0

## 2019-02-17 LAB — CBC
HCT: 28.3 % — ABNORMAL LOW (ref 36.0–46.0)
Hemoglobin: 8.7 g/dL — ABNORMAL LOW (ref 12.0–15.0)
MCH: 25.4 pg — ABNORMAL LOW (ref 26.0–34.0)
MCHC: 30.7 g/dL (ref 30.0–36.0)
MCV: 82.5 fL (ref 80.0–100.0)
Platelets: 207 10*3/uL (ref 150–400)
RBC: 3.43 MIL/uL — ABNORMAL LOW (ref 3.87–5.11)
RDW: 17 % — ABNORMAL HIGH (ref 11.5–15.5)
WBC: 7.9 10*3/uL (ref 4.0–10.5)
nRBC: 0 % (ref 0.0–0.2)

## 2019-02-17 LAB — RENAL FUNCTION PANEL
Albumin: 3.4 g/dL — ABNORMAL LOW (ref 3.5–5.0)
Anion gap: 16 — ABNORMAL HIGH (ref 5–15)
BUN: 118 mg/dL — ABNORMAL HIGH (ref 8–23)
CO2: 29 mmol/L (ref 22–32)
Calcium: 9.5 mg/dL (ref 8.9–10.3)
Chloride: 93 mmol/L — ABNORMAL LOW (ref 98–111)
Creatinine, Ser: 2.78 mg/dL — ABNORMAL HIGH (ref 0.44–1.00)
GFR calc Af Amer: 17 mL/min — ABNORMAL LOW (ref >60)
GFR calc non Af Amer: 15 mL/min — ABNORMAL LOW (ref >60)
Glucose, Bld: 118 mg/dL — ABNORMAL HIGH (ref 70–99)
Phosphorus: 6.2 mg/dL — ABNORMAL HIGH (ref 2.5–4.6)
Potassium: 3.6 mmol/L (ref 3.5–5.1)
Sodium: 138 mmol/L (ref 135–145)

## 2019-02-17 LAB — BPAM RBC
Blood Product Expiration Date: 202101262359
ISSUE DATE / TIME: 202101111816
Unit Type and Rh: 7300

## 2019-02-17 LAB — FOLATE: Folate: 69 ng/mL (ref >5.9)

## 2019-02-17 LAB — RETICULOCYTES
Immature Retic Fract: 25.9 % — ABNORMAL HIGH (ref 2.3–15.9)
RBC.: 3.5 MIL/uL — ABNORMAL LOW (ref 3.87–5.11)
Retic Count, Absolute: 73.2 10*3/uL (ref 19.0–186.0)
Retic Ct Pct: 2.1 % (ref 0.4–3.1)

## 2019-02-17 LAB — VITAMIN B12: Vitamin B-12: 3016 pg/mL — ABNORMAL HIGH (ref 180–914)

## 2019-02-17 LAB — FERRITIN: Ferritin: 23 ng/mL (ref 11–307)

## 2019-02-17 LAB — IRON AND TIBC
Iron: 26 ug/dL — ABNORMAL LOW (ref 28–170)
Saturation Ratios: 5 % — ABNORMAL LOW (ref 10.4–31.8)
TIBC: 515 ug/dL — ABNORMAL HIGH (ref 250–450)
UIBC: 489 ug/dL

## 2019-02-17 MED ORDER — BISACODYL 10 MG RE SUPP
10.0000 mg | Freq: Once | RECTAL | Status: AC
  Administered 2019-02-17: 10 mg via RECTAL
  Filled 2019-02-17: qty 1

## 2019-02-17 MED ORDER — SENNA 8.6 MG PO TABS
1.0000 | ORAL_TABLET | Freq: Every day | ORAL | Status: DC
  Administered 2019-02-17 – 2019-02-18 (×2): 8.6 mg via ORAL
  Filled 2019-02-17 (×2): qty 1

## 2019-02-17 MED ORDER — DARBEPOETIN ALFA 100 MCG/0.5ML IJ SOSY
100.0000 ug | PREFILLED_SYRINGE | INTRAMUSCULAR | Status: DC
  Administered 2019-02-17: 100 ug via SUBCUTANEOUS
  Filled 2019-02-17: qty 0.5

## 2019-02-17 MED ORDER — PERFLUTREN LIPID MICROSPHERE
1.0000 mL | INTRAVENOUS | Status: AC | PRN
  Administered 2019-02-17: 2 mL via INTRAVENOUS
  Filled 2019-02-17: qty 10

## 2019-02-17 MED ORDER — TRAMADOL HCL 50 MG PO TABS
50.0000 mg | ORAL_TABLET | Freq: Once | ORAL | Status: AC | PRN
  Administered 2019-02-17: 50 mg via ORAL
  Filled 2019-02-17: qty 1

## 2019-02-17 NOTE — Progress Notes (Addendum)
PMT provider follow-up with patient at bedside and daughter [name]) on speaker phone. MOST form completed. Patient's decisions include DNR/DNI, comfort focused care once discharged home, IVF/ABX for time trial if indicated, and NO feeding tube. She does NOT want any sort of surgeries or dialysis. Ms. Ohanian understands it is not safe for her to return back to her apartment alone but agreeable to live with her daughter who is primary caregiver. Patient is interested in home hospice services once medically optimized and can discharge home. Daughter agrees with initiation of hospice once discharged home. Copies of MOST form and durable DNR made for all 7 children and copy placed in chart to be scanned into EMR.   DNR/DNI. Continue medical management. Medically optimize with plan to discharge home with hospice services. Updated attending.   Full palliative note to follow.  NO CHARGE  Ihor Dow, [place], FNP-C Palliative Medicine Team  Phone: [phone] Fax: [phone]

## 2019-02-17 NOTE — Progress Notes (Signed)
Daily Progress Note   Patient Name: Jodi [name]       Date: 02/17/2019 DOB: 13-Apr-1932  Age: 86 y.o. MRN#: [id number] Attending Physician: Kinnie Feil, MD Primary Care Physician: [name], MD Admit Date: 02/15/2019  Reason for Consultation/Follow-up: Establishing goals of care  Subjective: Patient awake, alert, oriented and able to participate in discussion. She is sitting in recliner. Reports that her shoulder is feeling better this morning. When assisted with ambulation to [facility], she easily becomes short of breath.   GOC:  F/u GOC with patient and daughter Jodi [name]) on speaker phone. Again introduced role of palliative medicine.   Discussed course of hospitalization including diagnoses, interventions, plan of care. During discussion, nephrology visits and emphasizes recommendations from nephrology standpoint, including that she would be a poor candidate for dialysis. Patient agrees and tells [place] she does not want dialysis.   Frankly and compassionately shared poor long-term prognosis with underlying chronic hearth conditions including heart failure and now kidney involvement, likely cardiorenal syndrome.  I attempted to elicit values and goals of care important to the patient. Advanced directives, concepts specific to code status, artifical feeding and hydration, and rehospitalization were considered and discussed. Patient confirms her decision for DNR code status and states "I don't want to live on machines." She does NOT want any surgeries ("too old for that") and again shares that she would not want dialysis.   The difference between aggressive medical intervention and comfort care was considered in light of the patient's goals of care. Introduced outpatient home health  with palliative versus outpatient hospice options and philosophy. Explained that she is eligible for home hospice support.   It is very important to the patient's 7 children to keep her home and out of a nursing home. Emphasized to patient that she cannot live alone in her apartment anymore and requires caregiving support from her children. Patient understands this but wishes to keep her apartment and visit from time to time. Jodi [name] respects this decision.   Patient and daughter are interested in home hospice services. Patient does NOT wish to be continuously hospitalized as her health continues to decline. "Let God make the decision."  Discussed and completed MOST form. Patient's wishes include DNR/DNI, comfort measures once discharged home, IVF/ABX for time trial if indicated, and NO feeding tube. Durable  DNR completed. Copies of MOST and durable DNR made for all 7 children and copy placed in chart for EMR. Jodi [name] understands and respects her mother's EOL wishes.   Patient and daughter share that she was extremely sick in 2003 (when she required her second CABG) and was not expected to live this long. They state she is a "miracle woman" and very thankful for 18 more years with her!   Discussed plan for medical optimization inpatient with plan to discharge home with hospice services. Patient and daughter agree with this plan. Also discussed symptom management.   Questions and concerns were addressed.  Hard Choices booklet left for review. PMT contact information given.    Length of Stay: 2  Current Medications: Scheduled Meds:  . sodium chloride   Intravenous Once  . apixaban  2.5 mg Oral BID  . bisacodyl  10 mg Rectal Once  . furosemide  80 mg Intravenous TID  . isosorbide-hydrALAZINE  1 tablet Oral TID  . metolazone  5 mg Oral Daily  . metoprolol tartrate  12.5 mg Oral BID  . multivitamin with minerals  1 tablet Oral Daily  . polyethylene glycol  17 g Oral BID  . rosuvastatin  20 mg Oral  Daily  . vitamin B-12  1,000 mcg Oral Daily    Continuous Infusions:   PRN Meds: acetaminophen, albuterol, diclofenac Sodium, gabapentin, lidocaine  Physical Exam Vitals and nursing note reviewed.  Constitutional:      General: She is awake.     Appearance: She is ill-appearing.  HENT:     Head: Normocephalic and atraumatic.  Cardiovascular:     Rate and Rhythm: Bradycardia present.  Pulmonary:     Effort: No tachypnea, accessory muscle usage or respiratory distress.     Comments: Dyspnea with exertion  Abdominal:     Tenderness: There is no abdominal tenderness.  Skin:    General: Skin is warm and dry.     Coloration: Skin is pale.  Neurological:     Mental Status: She is alert and oriented to person, place, and time.  Psychiatric:        Attention and Perception: Attention normal.        Mood and Affect: Mood normal.        Speech: Speech normal.        Behavior: Behavior normal.        Cognition and Memory: Cognition normal.            Vital Signs: BP 120/71 (BP Location: Right Arm)   Pulse 69   Temp 97.9 F (36.6 C) (Oral)   Resp 19   Ht 5\' 3"  (1.6 m)   Wt 70.9 kg   SpO2 94%   BMI 27.69 kg/m  SpO2: SpO2: 94 % O2 Device: O2 Device: Room Air O2 Flow Rate:    Intake/output summary:   Intake/Output Summary (Last 24 hours) at 02/17/2019 1059 Last data filed at 02/17/2019 0449 Gross per 24 hour  Intake 270 ml  Output 1100 ml  Net -830 ml   LBM: Last BM Date: 02/17/19(per NT documentation) Baseline Weight: Weight: 72.7 kg Most recent weight: Weight: 70.9 kg       Palliative Assessment/Data: PPS 40%    Flowsheet Rows     Most Recent Value  Intake Tab  Referral Department  Hospitalist  Unit at Time of Referral  Med/Surg Unit  Palliative Care Primary Diagnosis  Cardiac  Palliative Care Type  New Palliative care  Reason for referral  Clarify Goals of Care  Date first seen by Palliative Care  02/16/19  Clinical Assessment  Palliative Performance  Scale Score  40%  Psychosocial & Spiritual Assessment  Palliative Care Outcomes  Patient/Family meeting held?  Yes  Who was at the meeting?  patient, daughter via telephone  Palliative Care Outcomes  Clarified goals of care, Counseled regarding hospice, Provided end of life care assistance, Provided psychosocial or spiritual support, ACP counseling assistance      Patient Active Problem List   Diagnosis Date Noted  . Ascites   . Chronic atrial fibrillation ([place])   . Palliative care by specialist   . Acute on chronic renal insufficiency   . Falling 02/15/2019  . Heart failure ([place]) 02/15/2019  . Acute on chronic kidney failure ([place])   . Chronic anticoagulation   . Normocytic anemia   . Gait instability 01/21/2019  . Restless legs 12/17/2018  . Acute on chronic combined systolic and diastolic heart failure ([place]) 08/26/2018  . Goals of care, counseling/discussion 02/14/2018  . Recurrent UTI 01/27/2018  . Acute on chronic diastolic (congestive) heart failure ([place]) 12/04/2016  . Thyroid nodule 08/21/2016  . Hyperglycemia 11/09/2015  . Chronic combined systolic and diastolic congestive heart failure ([place]) 03/04/2015  . Left renal mass   . Paroxysmal atrial fibrillation ([place]) 08/20/2013  . Renal cell carcinoma ([place]) 09/23/2012  . Chronic kidney disease (CKD), stage III (moderate) ([place]) 02/20/2012  . Aortic stenosis, severe 12/06/2010  . SEBORRHEA 11/10/2007  . Insomnia 06/09/2007  . HYPERCHOLESTEROLEMIA 04/04/2006  . HYPERTENSION, BENIGN SYSTEMIC 04/04/2006  . CORONARY, ARTERIOSCLEROSIS 04/04/2006  . Acute combined systolic and diastolic heart failure ([place]) 04/04/2006  . Osteoarthritis involving multiple joints on both sides of body 04/04/2006    Palliative Care Assessment & Plan   Patient Profile: 86 y.o. female  with past medical history of CHF EF 35-40%, CAD s/p CABG x2, aortic stenosis, mitral regurgitation, CKD stage IV, HLD, HTN, afib, OA admitted on 02/15/2019 with  worsening shortness of breath and lower extremity edema. Hospital admission for acute CHF exacerbation with AKI with underlying CKD stage IV with worsening creatinine. Underlying moderate to severe aortic stenosis and mitral regurgitation not felt to be a surgical candidate. Per primary cardiologist Dr. Einar Gip, guarded prognosis and recommending palliative consult for goals of care.   Assessment: Chronic combined systolic and diastolic heart failure AKI on CKD stage IV, likely cardiorenal syndrome Ascites Aortic stenosis/mitral regurgitation not a candidate for valve replacements Normocytic anemia Multiple falls Colloid cyst on CT Multiple joint OA Insomnia/restless leg  Recommendations/Plan:  MOST form completed with patient. DNR/DNI, comfort measures, IVF/ABX for time trial, and NO feeding tube. She does NOT want surgeries or dialysis. Daughter understands and respects her wishes. Durable DNR completed. Copies of MOST form and durable DNR placed in chart for EMR and 7 copies given to patient for her children.   Patient wishes to return home with support of home hospice services. She does not wish to be re-hospitalized as her health continues to decline. TOC team notified. Patient will discharge to daughter's home, [place].   Continue current plan of care and medical management. Medically optimize inpatient with plan to discharge home with hospice services.   Consider low-does xanax or ativan HS for insomnia/anxiety. Discussed with attending.    Code Status: DNR/DNI   Code Status Orders  (From admission, onward)         Start     Ordered   02/15/19 0512  Do not attempt resuscitation (DNR)  Continuous    Question Answer Comment  In the event of cardiac or respiratory ARREST Do not call a "code blue"   In the event of cardiac or respiratory ARREST Do not perform Intubation, CPR, defibrillation or ACLS   In the event of cardiac or respiratory ARREST Use medication by any route,  position, wound care, and other measures to relive pain and suffering. May use oxygen, suction and manual treatment of airway obstruction as needed for comfort.   Comments Discussed with patient and her son at bedside. Aggresive medical therapy only      02/15/19 0518        Code Status History    Date Active Date Inactive Code Status Order ID Comments User Context   10/07/2018 1705 10/11/2018 1709 DNR [id number]  Lady Deutscher, MD ED   08/26/2018 0525 08/28/2018 1751 DNR [id number]  Patriciaann Clan, DO ED   12/08/2016 0817 08/25/2018 1259 DNR [id number]  [name], MD Inpatient   12/04/2016 1813 12/06/2016 1825 Full Code [id number]  [name], MD ED   04/18/2015 1532 04/19/2015 1904 Full Code [id number]  Nicolette Bang, DO ED   03/05/2015 0112 03/05/2015 1939 Full Code [id number]  Rosemarie Ax, MD Inpatient   09/27/2014 1723 09/30/2014 1640 Full Code [id number]  [name], MD Inpatient   09/27/2014 1656 09/27/2014 1723 Full Code [id number]  Neldon Labella, NP Inpatient   11/24/2013 1045 11/24/2013 1910 Full Code [id number]  [name], MD Inpatient   08/20/2013 2203 08/21/2013 1648 Full Code [id number]  [name], MD ED   Advance Care Planning Activity       Prognosis:   < 6 months if not significantly less with combined CHF, cardiorenal syndrome and low urine-output. Declining functional status.   Discharge Planning:  Home with Hospice  Care plan was discussed with patient, daughter Jodi [name]), RN, Dr. Moshe Cipro, Dr. Susa Simmonds  Thank you for allowing the Palliative Medicine Team to assist in the care of this patient.   Time In: 0945 Time Out: 1115 Total Time 90 Prolonged Time Billed yes      Greater than 50%  of this time was spent counseling and coordinating care related to the above assessment and plan.  Ihor Dow, DNP, FNP-C Palliative Medicine Team  Phone: [phone] Fax: [phone]  Please contact Palliative Medicine Team phone at [phone] for questions and  concerns.

## 2019-02-17 NOTE — Consult Note (Signed)
   [facility] CM Inpatient Consult   02/17/2019  Jodi [name] 14-Oct-1932 [id number]  Patient screened for extreme high risk score for unplanned readmission in [place] with [employer] [[employer]].   Review of patient's medical record was briefly reviewed for post hospital follow up needs or barriers to care and reveals patient is currently being recommended for transition of care with Hospice/Palliative. Briefly reviewed Palliative consult notes for needs.  No inpatient Transition of Care team notes at this time.  Plan:  No current [facility] Care Management needs noted for transition, at this time. Will follow for final disposition.  Please place a [facility] Care Management consult as appropriate and for questions contact:   Natividad Brood, RN BSN [place] [facility] Liaison  [phone] business mobile phone Toll free office [phone]  Fax number: [phone] [email or web address]

## 2019-02-17 NOTE — Progress Notes (Signed)
Subjective:  Sitting up eating breakfast , looks brighter.  Pall care in room and daughter on speaker phone  1100 of UOP and BUN and crt slightly improved  Objective Vital signs in last 24 hours: Vitals:   02/16/19 1810 02/16/19 1845 02/16/19 2015 02/17/19 0443  BP: 119/66 (!) 116/49 130/81 120/71  Pulse: (!) 59 68 66 69  Resp: 15 19 (!) 23 19  Temp: (!) 97.4 F (36.3 C) 97.8 F (36.6 C) 97.7 F (36.5 C) 97.9 F (36.6 C)  TempSrc: Oral Oral Oral Oral  SpO2: 96% 95% 97% 94%  Weight:    70.9 kg  Height:       Weight change: -1.803 kg  Intake/Output Summary (Last 24 hours) at 02/17/2019 1129 Last data filed at 02/17/2019 0449 Gross per 24 hour  Intake 270 ml  Output 1100 ml  Net -830 ml    Assessment/ Plan: Pt is a 86 y.o. yo female with CAD, AS, MR combined CHF and stage 3 CKD (crt in the mid 2's) who was admitted on 02/15/2019 with falls and volume overload  Assessment/Plan: 1. Renal-  A on CRF- U/A bland- likely cardiorenal pathology given many cardiac issues.  Some UOP but with worsening renal failure.  Unfortunately I do not think would be a candidate for dialysis therapy due to cardiac comorbidities- would not tolerate. In addition patient does not desire dialysis- understanding what the consequences might be.   I agree with palliative care to address [place] with the patient and family 2. HTN/vol--- overloaded but with soft BP - low dose lopressor and IV lasix  80 q 8 hours as well as metolazone.  Fine response to that.  Now on bidil as well ?? Need to be careful about dropping BP.   3. Anemia-  Worsening-   iron stores pending - will give one time dose ESA 4. CV-  Combined CHF and valvular issues that dont appear correctable- complicating issue  Looks like palliative is determining the dispo.  Renal will sign off but will follow up on iron stores and give IV iron if still here.  I would continue IV lasix while in house but then if goes home likely will need at least 160 mg bid PO  lasix to maintain volume status for comfort- cal if questions    Louis Meckel    Labs: Basic Metabolic Panel: Recent Labs  Lab 02/15/19 0004 02/16/19 0342 02/17/19 0416  NA 137 138 138  K 4.3 4.1 3.6  CL 95* 97* 93*  CO2 28 29 29   GLUCOSE 121* 122* 118*  BUN 115* 122* 118*  CREATININE 2.82* 3.10* 2.78*  CALCIUM 9.0 9.0 9.5  PHOS  --   --  6.2*   Liver Function Tests: Recent Labs  Lab 02/15/19 0004 02/17/19 0416  AST 21  --   ALT 14  --   ALKPHOS 77  --   BILITOT 1.0  --   PROT 6.3*  --   ALBUMIN 3.6 3.4*   No results for input(s): LIPASE, AMYLASE in the last 168 hours. No results for input(s): AMMONIA in the last 168 hours. CBC: Recent Labs  Lab 02/15/19 0004 02/16/19 0342 02/16/19 1220 02/17/19 0416  WBC 9.3 8.8 7.9 7.9  NEUTROABS 5.0  --  4.7  --   HGB 8.0* 7.5* 7.4* 8.7*  HCT 27.4* 24.3* 24.5* 28.3*  MCV 85.9 82.4 82.5 82.5  PLT 230 214 219 207   Cardiac Enzymes: No results for input(s): CKTOTAL, CKMB,  CKMBINDEX, TROPONINI in the last 168 hours. CBG: No results for input(s): GLUCAP in the last 168 hours.  Iron Studies: No results for input(s): IRON, TIBC, TRANSFERRIN, FERRITIN in the last 72 hours. Studies/Results: US RENAL  Result Date: 02/15/2019 CLINICAL DATA:  Acute on chronic renal failure EXAM: RENAL / URINARY TRACT ULTRASOUND COMPLETE COMPARISON:  February 13, 2012 FINDINGS: Right Kidney: Renal measurements: 10.8 x 4.7 x 5.2 cm = volume: 138.2 mL. Contains a 2.3 cm septated cyst with no nodular components. Also contains a 2.5 cm simple cyst. Increased cortical echogenicity. Left Kidney: Renal measurements: 10.4 x 5.1 x 5.2 cm = volume: 145 mL. Contains renal cysts. No hydronephrosis. Increased cortical echogenicity. Bladder: Appears normal for degree of bladder distention. Other: Ascites. Small right pleural effusion. A small amount of perinephric fluid is seen on the left. IMPRESSION: 1. Bilateral renal cysts.  No hydronephrosis. 2.  Medical renal disease. 3. Mild perinephric fluid on the left is nonspecific given ascites elsewhere. 4. Right pleural effusion. Electronically Signed   By: Dorise Bullion III M.D   On: 02/15/2019 14:00   US Abdomen Limited  Result Date: 02/16/2019 CLINICAL DATA:  Abdominal distension. EXAM: LIMITED ABDOMEN ULTRASOUND FOR ASCITES TECHNIQUE: Limited ultrasound survey for ascites was performed in all four abdominal quadrants. COMPARISON:  None. FINDINGS: Small ascites visualized in all 4 quadrants of the abdomen. IMPRESSION: Small ascites. Electronically Signed   By: Titus Dubin M.D.   On: 02/16/2019 13:35   Medications: Infusions:   Scheduled Medications: . apixaban  2.5 mg Oral BID  . bisacodyl  10 mg Rectal Once  . furosemide  80 mg Intravenous TID  . isosorbide-hydrALAZINE  1 tablet Oral TID  . metolazone  5 mg Oral Daily  . metoprolol tartrate  12.5 mg Oral BID  . multivitamin with minerals  1 tablet Oral Daily  . polyethylene glycol  17 g Oral BID  . rosuvastatin  20 mg Oral Daily  . senna  1 tablet Oral Daily  . vitamin B-12  1,000 mcg Oral Daily    have reviewed scheduled and prn medications.  Physical Exam: General: more alert.   Says feels OK  Heart: RRR with murmers Lungs: ant clear Abdomen: soft, non tender Extremities: pitting edema     02/17/2019,11:29 AM  LOS: 2 days

## 2019-02-17 NOTE — Progress Notes (Signed)
Pharmacist Heart Failure Core Measure Documentation  Assessment: Jodi [name] has an EF documented as 35-40% on ECHO by 08/26/18.  Rationale: Heart failure patients with left ventricular systolic dysfunction (LVSD) and an EF < 40% should be prescribed an angiotensin converting enzyme inhibitor (ACEI) or angiotensin receptor blocker (ARB) at discharge unless a contraindication is documented in the medical record.  This patient is not currently on an ACEI or ARB for HF.  This note is being placed in the record in order to provide documentation that a contraindication to the use of these agents is present for this encounter.  ACE Inhibitor or Angiotensin Receptor Blocker is contraindicated (specify all that apply)  []   ACEI allergy AND ARB allergy []   Angioedema []   Moderate or severe aortic stenosis []   Hyperkalemia []   Hypotension []   Renal artery stenosis [x]   Worsening renal function, preexisting renal disease or dysfunction   Antonietta Jewel, PharmD, BCCCP Clinical Pharmacist  Phone: [phone]  Please check AMION for all [place] phone numbers After 10:00 PM, call [place] [phone]  02/17/2019 1:01 PM

## 2019-02-17 NOTE — Progress Notes (Signed)
  Echocardiogram 2D Echocardiogram has been performed.  Jodi [name] 02/17/2019, 2:28 PM

## 2019-02-17 NOTE — Progress Notes (Signed)
Family Medicine Teaching Service Daily Progress Note Intern Pager: [phone]  Patient name: Jodi [name] Medical record number: [id number] Date of birth: [birth year] Age: 86 y.o. Gender: female  Primary Care Provider: [name], MD Consultants: cardiology, nephrology  Code Status: DNR   Pt Overview and Major Events to Date:  02/15/19: Admitted, nephrology and cardiology consulted    Assessment and Plan: Jodi [name] is a 86 y.o. female who presented with abdominal distention and 5 falls in last 4 days . PMH is significant for history of heart failure,hyperlipidemia,hypertension,CAD, CABG x2,AFIB,aortic stenosis, MR,CKD,OAand is admitted forworsening shortness of breath and lower extremity edema.Admitted for acute exacerbation of CHF.    Acute combined systolic and diastolic congestive heart failure Ascites  BP 120/71 this morning.  BP range 116/49 - 132/59.  Per cardiology, resume IV Lasix 80 mg 3 times daily.  Reduce BiDil to 1 tablet 3 times daily.  Continue metolazone 5 mg.  Discontinue Aldactone indefinitely.  And Lopressor 12.5 mg twice daily.  Consider dobutamine infusion short-term if no improvement in CHF.  On exam, rales bilaterally.  Abdominal ultrasound showed small amount of ascites in all quadrants. -Cardiology following, appreciate recommendations -Nephrology consulted, appreciate recommendations -Palliative following, appreciate recommendations -80 mg IV Lasix 3 times daily -Metolazone 5 mg -Strict I's and O's -Daily weights -PT/OT eval and treat -Discontinue Aldactone at discharge   Normocytic anemia Hemoglobin stable at 8.7 status post 1 unit PRBCs.  Negative FOBT in ED. Family reported seeing bright red blood in patient's underwear. Consider risk versus benefits of Eliquis given multiple falls  given multiple falls and reported bleeding per family. -Transfusion threshold Hgb < 8 -Q12H CBC  -Speak with patient and family regarding  continuance of Eliquis   Multiple falls  Shoulder pain Patient had 5 unwitnessed mechanical falls in the four days prior to arrival. Patient is reasonably independent at home. Etiology likely multifactorial (CHFe, deconditioning, tripping over things in home, colloid cyst). Patient is on Eliquis but Head CT was negative for acute bleed. No bony abnormalities obvious physical exam.   - Voltaren gel 1% PRN  - Lidocaine patch - K pad - PT OT to evaluate and treat    AKI  CKD Stage IV Creatinine 2.78 from 3.1 yesterday we will continue to monitor kidney function.  Baseline creatinine September 2020 was 1.9, but has been 2.4 since and was 2.8 on admission.  Discontinued Aldactone. Nephrology does not recommend long-term dialysis given her comorbidities.  - - Nephrology consulted, appreciate recommendations  -Avoid nephrotoxic agents, attempt to balance renal and cardiac needs for diuresis - Fluid restriction to 1.5 L - Strict I/O - Vitamin D 2000 units - Daily BMP  Constipation Abdominal distention concerning for constipation. - MiraLAX 17 g twice daily - Dulcolax 10 mg daily  pAFIB Rate controlled. HR 59-74. Considering holding Eliquis.  Continue home eliquis 2.5 mg twice daily Continue home metoprolol 12.5 mg twice daily - Follow up CBC    Colloid cyst on CT Family and patient aware. Non obsturctive at this point, would require surgery to remove and patient is poor surgical candidate   Multiple Joint OA - Continue home medication management -Tylenol every 4 hours as needed   Hypercholesteremia Continue home rosuvastatin 20 mg daily   HTN Home isosorbide hydralazine 20mg -37.5 mg 3 times daily - Reduce to 1 tablet Bidil TID   Insomnia  restless leg Recently discontinued off Ambien, daughter says that she believes gabapentin has been because of her waking up  in the middle the night although her mother is emotionally attached to gabapentin which she says is the  only thing that treats her severe restless leg.  We discussed change in effective gabapentin dose with change in renal function. -PRN gabapentin reduced to 100 mg nightly   FEN/GI: Heart healthy diet with fluid restriction, Miralax PPx: Eliquis   Disposition: pending medical clearance and PT/OT eval   Subjective:  Jodi [name] continues to have shoulder pain and was given Tylenol and K pad overnight.    Objective: Temp:  [97.4 F (36.3 C)-97.9 F (36.6 C)] 97.9 F (36.6 C) (01/12 0443) Pulse Rate:  [59-74] 69 (01/12 0443) Resp:  [15-23] 19 (01/12 0443) BP: (116-132)/(49-81) 120/71 (01/12 0443) SpO2:  [94 %-97 %] 94 % (01/12 0443) Weight:  [70.9 kg] 70.9 kg (01/12 0443)   Physical Exam: GEN: pleasant elderly female in no acute distress  CV: regular rate and rhythm, systolic murmur RESP: diffuse rales, no wheezing, no increased work of breathing  ABD: Bowel sounds present. soft, nontender, nondistended.  MSK: 2+ b/l LE edema no cyanosis noted SKIN: warm, dry NEURO: grossly normal, alert and oriented       Laboratory: Recent Labs  Lab 02/16/19 0342 02/16/19 1220 02/17/19 0416  WBC 8.8 7.9 7.9  HGB 7.5* 7.4* 8.7*  HCT 24.3* 24.5* 28.3*  PLT 214 219 207   Recent Labs  Lab 02/15/19 0004 02/16/19 0342 02/17/19 0416  NA 137 138 138  K 4.3 4.1 3.6  CL 95* 97* 93*  CO2 28 29 29   BUN 115* 122* 118*  CREATININE 2.82* 3.10* 2.78*  CALCIUM 9.0 9.0 9.5  PROT 6.3*  --   --   BILITOT 1.0  --   --   ALKPHOS 77  --   --   ALT 14  --   --   AST 21  --   --   GLUCOSE 121* 122* 118*      Imaging/Diagnostic Tests: US RENAL  Result Date: 02/15/2019 CLINICAL DATA:  Acute on chronic renal failure EXAM: RENAL / URINARY TRACT ULTRASOUND COMPLETE COMPARISON:  February 13, 2012 FINDINGS: Right Kidney: Renal measurements: 10.8 x 4.7 x 5.2 cm = volume: 138.2 mL. Contains a 2.3 cm septated cyst with no nodular components. Also contains a 2.5 cm simple cyst. Increased  cortical echogenicity. Left Kidney: Renal measurements: 10.4 x 5.1 x 5.2 cm = volume: 145 mL. Contains renal cysts. No hydronephrosis. Increased cortical echogenicity. Bladder: Appears normal for degree of bladder distention. Other: Ascites. Small right pleural effusion. A small amount of perinephric fluid is seen on the left. IMPRESSION: 1. Bilateral renal cysts.  No hydronephrosis. 2. Medical renal disease. 3. Mild perinephric fluid on the left is nonspecific given ascites elsewhere. 4. Right pleural effusion. Electronically Signed   By: Dorise Bullion III M.D   On: 02/15/2019 14:00   US Abdomen Limited  Result Date: 02/16/2019 CLINICAL DATA:  Abdominal distension. EXAM: LIMITED ABDOMEN ULTRASOUND FOR ASCITES TECHNIQUE: Limited ultrasound survey for ascites was performed in all four abdominal quadrants. COMPARISON:  None. FINDINGS: Small ascites visualized in all 4 quadrants of the abdomen. IMPRESSION: Small ascites. Electronically Signed   By: Titus Dubin M.D.   On: 02/16/2019 13:35     Lyndee Hensen, DO PGY-1, [facility] 02/17/2019 8:45 AM   FPTS Intern pager: [phone], text pages welcome

## 2019-02-17 NOTE — Progress Notes (Signed)
Subjective:  Feels tired. Still constipated but had a small bowel movement. Dyspnea has improved marginally. .   Intake/Output from previous day:  I/O last 3 completed shifts: In: 510 [P.O.:480; Blood:30] Out: 1100 [Urine:1100] No intake/output data recorded.  Blood pressure 119/60, pulse 68, temperature 97.9 F (36.6 C), temperature source Oral, resp. rate 14, height 5' 3"  (1.6 m), weight 70.9 kg, SpO2 94 %. Physical Exam  Constitutional: She appears well-developed and well-nourished. No distress.  HENT:  Head: Atraumatic.  Eyes: Conjunctivae are normal.  Neck: No thyromegaly present.  Cardiovascular: Intact distal pulses and normal pulses. An irregularly irregular rhythm present. Exam reveals no gallop, no S3 and no S4.  Murmur heard.  Harsh mid to late systolic murmur is present with a grade of 3/6 at the upper right sternal border radiating to the neck. High-pitched blowing holosystolic murmur is also present at the apex. Pulses:      Carotid pulses are on the right side with bruit and on the left side with bruit. S1 is variable and soft, S2 is muffled. JVD elevated to angle of jaw and 2 plus pitting leg edema bilateral right worse  Pulmonary/Chest: Effort normal and breath sounds normal.  Abdominal: Soft. Bowel sounds are normal. She exhibits distension. There is no abdominal tenderness.  Musculoskeletal:        General: Normal range of motion.     Cervical back: Neck supple.  Neurological: She is alert.  Skin: Skin is warm and dry.  Psychiatric: She has a normal mood and affect.   Lab Results: BMP BNP (last 3 results) Recent Labs    10/22/18 1639 12/23/18 1854 02/15/19 0004  BNP 791.1* 1,158.0* 1,319.8*    ProBNP (last 3 results) No results for input(s): PROBNP in the last 8760 hours. BMP Latest Ref Rng & Units 02/17/2019 02/16/2019 02/15/2019  Glucose 70 - 99 mg/dL 118(H) 122(H) 121(H)  BUN 8 - 23 mg/dL 118(H) 122(H) 115(H)  Creatinine 0.44 - 1.00 mg/dL 2.78(H)  3.10(H) 2.82(H)  BUN/Creat Ratio 12 - 28 - - -  Sodium 135 - 145 mmol/L 138 138 137  Potassium 3.5 - 5.1 mmol/L 3.6 4.1 4.3  Chloride 98 - 111 mmol/L 93(L) 97(L) 95(L)  CO2 22 - 32 mmol/L 29 29 28   Calcium 8.9 - 10.3 mg/dL 9.5 9.0 9.0   Hepatic Function Latest Ref Rng & Units 02/17/2019 02/15/2019 12/04/2016  Total Protein 6.5 - 8.1 g/dL - 6.3(L) 6.8  Albumin 3.5 - 5.0 g/dL 3.4(L) 3.6 4.1  AST 15 - 41 U/L - 21 29  ALT 0 - 44 U/L - 14 15  Alk Phosphatase 38 - 126 U/L - 77 80  Total Bilirubin 0.3 - 1.2 mg/dL - 1.0 1.4(H)   CBC Latest Ref Rng & Units 02/17/2019 02/16/2019 02/16/2019  WBC 4.0 - 10.5 K/uL 7.9 7.9 8.8  Hemoglobin 12.0 - 15.0 g/dL 8.7(L) 7.4(L) 7.5(L)  Hematocrit 36.0 - 46.0 % 28.3(L) 24.5(L) 24.3(L)  Platelets 150 - 400 K/uL 207 219 214   Lipid Panel     Component Value Date/Time   CHOL 134 02/15/2019 1243   TRIG 85 02/15/2019 1243   HDL 38 (L) 02/15/2019 1243   CHOLHDL 3.5 02/15/2019 1243   VLDL 17 02/15/2019 1243   LDLCALC 79 02/15/2019 1243   LDLDIRECT 79 10/22/2011 1448   Cardiac Panel (last 3 results) No results for input(s): CKTOTAL, CKMB, TROPONINI, RELINDX in the last 72 hours.  HEMOGLOBIN A1C Lab Results  Component Value Date   HGBA1C 6.4 (  H) 02/15/2019   MPG 136.98 02/15/2019   TSH Recent Labs    08/28/18 0412 10/08/18 1139 02/15/19 1243  TSH 7.322* 6.769* 6.560*   Imaging: US RENAL  Result Date: 02/15/2019 CLINICAL DATA:  Acute on chronic renal failure EXAM: RENAL / URINARY TRACT ULTRASOUND COMPLETE COMPARISON:  February 13, 2012 FINDINGS: Right Kidney: Renal measurements: 10.8 x 4.7 x 5.2 cm = volume: 138.2 mL. Contains a 2.3 cm septated cyst with no nodular components. Also contains a 2.5 cm simple cyst. Increased cortical echogenicity. Left Kidney: Renal measurements: 10.4 x 5.1 x 5.2 cm = volume: 145 mL. Contains renal cysts. No hydronephrosis. Increased cortical echogenicity. Bladder: Appears normal for degree of bladder distention. Other:  Ascites. Small right pleural effusion. A small amount of perinephric fluid is seen on the left. IMPRESSION: 1. Bilateral renal cysts.  No hydronephrosis. 2. Medical renal disease. 3. Mild perinephric fluid on the left is nonspecific given ascites elsewhere. 4. Right pleural effusion. Electronically Signed   By: Dorise Bullion III M.D   On: 02/15/2019 14:00   US Abdomen Limited  Result Date: 02/16/2019 CLINICAL DATA:  Abdominal distension. EXAM: LIMITED ABDOMEN ULTRASOUND FOR ASCITES TECHNIQUE: Limited ultrasound survey for ascites was performed in all four abdominal quadrants. COMPARISON:  None. FINDINGS: Small ascites visualized in all 4 quadrants of the abdomen. IMPRESSION: Small ascites. Electronically Signed   By: Titus Dubin M.D.   On: 02/16/2019 13:35    Cardiac Studies:  Echocardiogram 08/26/2018:  1. The left ventricle has moderately reduced systolic function, with an ejection fraction of 35-40%. There is moderately increased left ventricular wall thickness. Indeterminated due to mitral apparatus calcification. There is abnormal septal motion  consistent with post-operative status. Left ventricular diffuse hypokinesis. Severe hypokinesis of the left ventricular, entire inferior wall. Severe hypokinesis of the left ventricular, entire inferoseptal wall. 2. The right ventricle has moderately reduced systolic function. The cavity was moderately enlarged. There is no increase in right ventricular wall thickness. 3. Left atrial size was severely dilated. 4. The mitral valve is degenerative. Moderate thickening of the mitral valve leaflet. Moderate calcification of the mitral valve leaflet. Mitral valve regurgitation is mild to moderate by color flow Doppler. 5. The tricuspid valve is grossly normal. Tricuspid valve regurgitation is mild-moderate. Pulmonary hypertension is moderate. PA pressure 48 mm Hg (RA 15) 6. The aortic valve is tricuspid. Severely thickening of the aortic valve.  Severe calcifcation of the aortic valve. Moderate-severe stenosis of the aortic valve. The gradients may be underestimated due to poor signial and low LVEF. 7. There is evidence of mild plaque in the aortic root.  Nocturnal oximetry 03/07/2017: No significant hypoxemia.  left + right Heart catheterization 11/24/2013: Moderate pulmonary hypertension, PA pressure 46/18 mmHg. Cardiac output 3.96, Cardiac index 2.2. SVG to RCA ostial 90%, patent free RIMA to RI, SVG to OM1, LIMA to LAD. Aortic valve area 1.1 cm, Mean gradient of 14 mmHg. Moderate to severe mitral regu  Scheduled Meds: . apixaban  2.5 mg Oral BID  . darbepoetin (ARANESP) injection - NON-DIALYSIS  100 mcg Subcutaneous Q Tue-1800  . furosemide  80 mg Intravenous TID  . isosorbide-hydrALAZINE  1 tablet Oral TID  . metolazone  5 mg Oral Daily  . metoprolol tartrate  12.5 mg Oral BID  . multivitamin with minerals  1 tablet Oral Daily  . polyethylene glycol  17 g Oral BID  . rosuvastatin  20 mg Oral Daily  . senna  1 tablet Oral Daily  . vitamin B-12  1,000 mcg Oral Daily   Continuous Infusions: PRN Meds:.acetaminophen, albuterol, diclofenac Sodium, gabapentin, lidocaine  Assessment/Plan:  1.  Acute on chronic systolic and diastolic heart failure involving both the right ventricle and left ventricle now predominantly right-sided heart failure with elevated JVD, ascites and also leg edema. 2.  Acute on chronic stage IV kidney disease, worsening serum creatinine. 3.  Failure to thrive in adult. 4.  Multivalve a heart disease including moderate to severe aortic stenosis and moderate to severe until regurgitation, not felt to be a surgical candidate. 5.  DO NOT RESUSCITATE status.   EKG 02/15/2019: Atrial fibrillation with controlled ventricular response at the rate of 66 bpm, inferior infarct old.  Poor R-wave progression, cannot exclude anteroseptal infarct old.  Nonspecific IVCD.  Nonspecific T abnormality. No significant  change from  Prior EKG.  Recommendation:  Patient still has significant abdominal distention, urine output is only marginal.  Serum creatinine is stable.  I am not completely convinced that she will diurese well with underlying biventricular heart failure and also cardiorenal syndrome and underlying stage IV chronic kidney disease.  She may benefit from low-dose dopamine and dobutamine, prior to this I will obtain an echocardiogram as a baseline.  Bradycardia is of no clinical consequence, she had transient low heart rate in 50s, probably related to the atrial fibrillation with variable ventricular response.  Continue beta-blocker therapy.  Blood pressure is well controlled.  No hypotension.  Also continue low-dose BiDil 1 p.o. 3 times daily, previously was on 2 tablets 3 times daily.  Continue IV furosemide along with metolazone.  Peritoneocentesis may be a good option to mobilize fluid relatively faster.  [name], M.D. 02/17/2019, 12:24 PM [facility] Pager: [phone]

## 2019-02-18 ENCOUNTER — Ambulatory Visit: Payer: Medicare Other | Admitting: Family Medicine

## 2019-02-18 DIAGNOSIS — D649 Anemia, unspecified: Secondary | ICD-10-CM

## 2019-02-18 DIAGNOSIS — Z7901 Long term (current) use of anticoagulants: Secondary | ICD-10-CM

## 2019-02-18 DIAGNOSIS — Z66 Do not resuscitate: Secondary | ICD-10-CM

## 2019-02-18 LAB — CBC
HCT: 26.9 % — ABNORMAL LOW (ref 36.0–46.0)
Hemoglobin: 8.4 g/dL — ABNORMAL LOW (ref 12.0–15.0)
MCH: 25.5 pg — ABNORMAL LOW (ref 26.0–34.0)
MCHC: 31.2 g/dL (ref 30.0–36.0)
MCV: 81.5 fL (ref 80.0–100.0)
Platelets: 204 10*3/uL (ref 150–400)
RBC: 3.3 MIL/uL — ABNORMAL LOW (ref 3.87–5.11)
RDW: 17.3 % — ABNORMAL HIGH (ref 11.5–15.5)
WBC: 6.5 10*3/uL (ref 4.0–10.5)
nRBC: 0 % (ref 0.0–0.2)

## 2019-02-18 LAB — RENAL FUNCTION PANEL
Albumin: 3.5 g/dL (ref 3.5–5.0)
Anion gap: 16 — ABNORMAL HIGH (ref 5–15)
BUN: 118 mg/dL — ABNORMAL HIGH (ref 8–23)
CO2: 31 mmol/L (ref 22–32)
Calcium: 9.5 mg/dL (ref 8.9–10.3)
Chloride: 90 mmol/L — ABNORMAL LOW (ref 98–111)
Creatinine, Ser: 2.53 mg/dL — ABNORMAL HIGH (ref 0.44–1.00)
GFR calc Af Amer: 20 mL/min — ABNORMAL LOW (ref >60)
GFR calc non Af Amer: 17 mL/min — ABNORMAL LOW (ref >60)
Glucose, Bld: 119 mg/dL — ABNORMAL HIGH (ref 70–99)
Phosphorus: 5.7 mg/dL — ABNORMAL HIGH (ref 2.5–4.6)
Potassium: 3.3 mmol/L — ABNORMAL LOW (ref 3.5–5.1)
Sodium: 137 mmol/L (ref 135–145)

## 2019-02-18 MED ORDER — ACETAMINOPHEN 325 MG PO TABS
650.0000 mg | ORAL_TABLET | Freq: Four times a day (QID) | ORAL | Status: DC
  Administered 2019-02-18 – 2019-02-20 (×8): 650 mg via ORAL
  Filled 2019-02-18 (×9): qty 2

## 2019-02-18 MED ORDER — POTASSIUM CHLORIDE CRYS ER 20 MEQ PO TBCR
20.0000 meq | EXTENDED_RELEASE_TABLET | Freq: Once | ORAL | Status: AC
  Administered 2019-02-18: 20 meq via ORAL
  Filled 2019-02-18: qty 1

## 2019-02-18 MED ORDER — TRAMADOL HCL 50 MG PO TABS
50.0000 mg | ORAL_TABLET | Freq: Two times a day (BID) | ORAL | Status: AC
  Administered 2019-02-18: 50 mg via ORAL
  Filled 2019-02-18: qty 1

## 2019-02-18 MED ORDER — SENNA 8.6 MG PO TABS
1.0000 | ORAL_TABLET | Freq: Two times a day (BID) | ORAL | Status: DC
  Administered 2019-02-18 – 2019-02-19 (×2): 8.6 mg via ORAL
  Filled 2019-02-18 (×3): qty 1

## 2019-02-18 MED ORDER — MILK AND MOLASSES ENEMA
1.0000 | Freq: Once | RECTAL | Status: DC
  Filled 2019-02-18: qty 240

## 2019-02-18 NOTE — Progress Notes (Signed)
PT Cancellation Note  Patient Details Name: BRAELIN BROSCH MRN: [id number] DOB: 11/22/1932   Cancelled Treatment:    Reason Eval/Treat Not Completed: Other (comment) patient asleep and fluctuating in and out of extreme bradycardia per monitor (as low as 38BPM). Will follow up with RN regarding today's session.    Windell Norfolk, DPT, PN1   Supplemental Physical Therapist [facility]    Pager [phone] Acute Rehab Office [phone]

## 2019-02-18 NOTE — Progress Notes (Addendum)
Family Medicine Teaching Service Daily Progress Note Intern Pager: [phone]  Patient name: Jodi [name] Medical record number: [id number] Date of birth: November 03, 1932 Age: 86 y.o. Gender: female  Primary Care Provider: [name], MD Consultants: cardiology, nephrology  Code Status: DNR   Pt Overview and Major Events to Date:  02/15/19: Admitted, nephrology and cardiology consulted  02/16/19: 1 unit PRBCs transfused   Assessment and Plan: Jodi [name] is a 86 y.o. female who presented with abdominal distention and 5 falls in last 4 days . PMH is significant for history of heart failure,hyperlipidemia,hypertension,CAD, CABG x2,AFIB,aortic stenosis, MR,CKD,OAand is admitted forworsening shortness of breath and lower extremity edema.Admitted for acute exacerbation of CHF.    Acute combined systolic and diastolic congestive heart failure Ascites  BP range 111/89 - 135/61.  Per cardiology, echo pending, patient may benefit from dobutamine or dopamine.  Patient with episodes of bradycardia throughout the day, per cardiology continue metoprolol as patient still is in atrial fibrillation.  Urinary output 1400.  Patient continues to have bibasilar rales on exam.  Lower extremity edema 1+ bilaterally.  Small ascites on in all 4 quadrants discussed with patient if she like to proceed with IR evaluation paracentesis.  Per palliative, patient will discharge home with home hospice once medically optimized. -Cardiology following, appreciate recommendations -Nephrology consulted, appreciate recommendations -Palliative following, appreciate recommendations -BiDil 1 tablet 3 times daily -80 mg IV Lasix 3 times daily -Lopressor 12.5 mg twice daily -Metolazone 5 mg -Strict I's and O's -Daily weights -PT/OT: Home health PT/OT, rolling walker with 5 inch wheels, 3 and 1 -Discontinue Aldactone at discharge   Normocytic anemia Hemoglobin stable at 8.4 from 8.7 yesterday  status post 1 unit PRBCs.  Will discuss with patient and family this versus benefits of continuing Eliquis given her multiple falls.  Nephrology considering ESA.  Will defer work-up to nephrology. -Transfusion threshold Hgb < 8 -Q12H CBC  -Speak with patient and family regarding continuance of Eliquis   Multiple falls  Shoulder pain Continues to have right shoulder pain requiring tramadol night.  Patient had 5 unwitnessed mechanical falls in the four days prior to arrival. Patient is reasonably independent at home. Etiology likely multifactorial (CHFe, deconditioning, tripping over things in home, colloid cyst). Patient is on Eliquis but Head CT was negative for acute bleed. No bony abnormalities obvious physical exam.   - Voltaren gel 1% PRN  - Lidocaine patch - K pad - PT OT to evaluate and treat  - PRN Tramadol    AKI  CKD Stage IV Creatinine downtrending to 2.58 from 2.78 yesterday.  Continue monitoring kidney function as patient is not a candidate for dialysis.  Appears patient's new baseline is 2.4. Nephrology does not recommend long-term dialysis given her comorbidities.   -  Nephrology following, appreciate recommendations  -Avoid nephrotoxic agents, attempt to balance renal and cardiac needs for diuresis - Fluid restriction to 1.5 L - Strict I/O - Vitamin D 2000 units - Daily BMP  Constipation Abdominal distention continues.  Patient with 1 charted stool yesterday and 1 charted stool this morning however patient states she had not had a bowel movement after saline enema given last night. - MiraLAX 17 g twice daily - Dulcolax 10 mg daily - Increase senna twice daily -Consider milk and molasses enema  pAFIB Rate controlled. HR 59-72. Considering holding Eliquis.  -Continue home eliquis 2.5 mg twice daily -Continue home metoprolol 12.5 mg twice daily - Follow up CBC    Colloid cyst on  CT Family and patient aware. Non obsturctive at this point, would require surgery  to remove and patient is poor surgical candidate   Multiple Joint OA - Continue home medication management -Tylenol every 4 hours as needed   Hypercholesteremia Continue home rosuvastatin 20 mg daily   HTN Home isosorbide hydralazine 20mg -37.5 mg 3 times daily - Reduce to 1 tablet Bidil TID   Insomnia  restless leg Recently discontinued off Ambien, daughter says that she believes gabapentin has been because of her waking up in the middle the night although her mother is emotionally attached to gabapentin which she says is the only thing that treats her severe restless leg.  We discussed change in effective gabapentin dose with change in renal function. -PRN gabapentin reduced to 100 mg nightly   FEN/GI: Heart healthy diet with fluid restriction, Miralax, Colace, senna.  Status post saline enema PPx: Eliquis   Disposition: Home with home hospice,Home health PT and OT  Subjective:  Jodi [name] complaint of right shoulder pain and was given tramadol overnight.  No significant overnight events.   Objective: Temp:  [97.8 F (36.6 C)-98.2 F (36.8 C)] 98.2 F (36.8 C) (01/12 2105) Pulse Rate:  [59-72] 69 (01/13 0403) Resp:  [14-19] 19 (01/13 0403) BP: (111-135)/(60-89) 111/89 (01/13 0403) SpO2:  [93 %-99 %] 98 % (01/13 0403) Weight:  [69.3 kg] 69.3 kg (01/13 0403)   Physical Exam: GEN: Sleeping but arises to voice CV: Irregularly irregular rhythm and regular rate, systolic murmur RESP: Bibasilar rales, no increased work of breathing ABD: Bowel sounds present. nontender MSK: 1+/2+ lower extremity edema bilaterally  SKIN: warm, dry         Laboratory: Recent Labs  Lab 02/16/19 1220 02/17/19 0416 02/18/19 0331  WBC 7.9 7.9 6.5  HGB 7.4* 8.7* 8.4*  HCT 24.5* 28.3* 26.9*  PLT 219 207 204   Recent Labs  Lab 02/15/19 0004 02/16/19 0342 02/17/19 0416 02/18/19 0331  NA 137 138 138 137  K 4.3 4.1 3.6 3.3*  CL 95* 97* 93* 90*  CO2 28 29 29 31    BUN 115* 122* 118* 118*  CREATININE 2.82* 3.10* 2.78* 2.53*  CALCIUM 9.0 9.0 9.5 9.5  PROT 6.3*  --   --   --   BILITOT 1.0  --   --   --   ALKPHOS 77  --   --   --   ALT 14  --   --   --   AST 21  --   --   --   GLUCOSE 121* 122* 118* 119*      Imaging/Diagnostic Tests: US Abdomen Limited  Result Date: 02/16/2019 CLINICAL DATA:  Abdominal distension. EXAM: LIMITED ABDOMEN ULTRASOUND FOR ASCITES TECHNIQUE: Limited ultrasound survey for ascites was performed in all four abdominal quadrants. COMPARISON:  None. FINDINGS: Small ascites visualized in all 4 quadrants of the abdomen. IMPRESSION: Small ascites. Electronically Signed   By: Titus Dubin M.D.   On: 02/16/2019 13:35     Lyndee Hensen, DO PGY-1, [facility] 02/18/2019 9:06 AM   FPTS Intern pager: [phone], text pages welcome

## 2019-02-18 NOTE — Progress Notes (Signed)
Subjective:  Feels tired. Still constipated but had a small bowel movement. Dyspnea has improved marginally. .   Intake/Output from previous day:  I/O last 3 completed shifts: In: 238 [P.O.:238] Out: 2200 [Urine:2200] No intake/output data recorded.  Blood pressure 111/89, pulse 69, temperature 98.2 F (36.8 C), temperature source Oral, resp. rate 19, height 5' 3"  (1.6 m), weight 69.3 kg, SpO2 98 %. Physical Exam  Constitutional: She appears well-developed and well-nourished. No distress.  HENT:  Head: Atraumatic.  Eyes: Conjunctivae are normal.  Neck: No thyromegaly present.  Cardiovascular: Intact distal pulses and normal pulses. An irregularly irregular rhythm present. Exam reveals no gallop, no S3 and no S4.  Murmur heard.  Harsh mid to late systolic murmur is present with a grade of 3/6 at the upper right sternal border radiating to the neck. High-pitched blowing holosystolic murmur is also present at the apex. Pulses:      Carotid pulses are on the right side with bruit and on the left side with bruit. S1 is variable and soft, S2 is muffled. JVD half way up the neck and trace pitting leg edema  Pulmonary/Chest: Effort normal and breath sounds normal.  Abdominal: Soft. Bowel sounds are normal. She exhibits distension (improved from yesterday). There is no abdominal tenderness.  Musculoskeletal:        General: Normal range of motion.     Cervical back: Neck supple.  Neurological: She is alert.  Skin: Skin is warm and dry.  Psychiatric: She has a normal mood and affect.   Lab Results: BMP BNP (last 3 results) Recent Labs    10/22/18 1639 12/23/18 1854 02/15/19 0004  BNP 791.1* 1,158.0* 1,319.8*    ProBNP (last 3 results) No results for input(s): PROBNP in the last 8760 hours. BMP Latest Ref Rng & Units 02/18/2019 02/17/2019 02/16/2019  Glucose 70 - 99 mg/dL 119(H) 118(H) 122(H)  BUN 8 - 23 mg/dL 118(H) 118(H) 122(H)  Creatinine 0.44 - 1.00 mg/dL 2.53(H) 2.78(H)  3.10(H)  BUN/Creat Ratio 12 - 28 - - -  Sodium 135 - 145 mmol/L 137 138 138  Potassium 3.5 - 5.1 mmol/L 3.3(L) 3.6 4.1  Chloride 98 - 111 mmol/L 90(L) 93(L) 97(L)  CO2 22 - 32 mmol/L 31 29 29   Calcium 8.9 - 10.3 mg/dL 9.5 9.5 9.0   Hepatic Function Latest Ref Rng & Units 02/18/2019 02/17/2019 02/15/2019  Total Protein 6.5 - 8.1 g/dL - - 6.3(L)  Albumin 3.5 - 5.0 g/dL 3.5 3.4(L) 3.6  AST 15 - 41 U/L - - 21  ALT 0 - 44 U/L - - 14  Alk Phosphatase 38 - 126 U/L - - 77  Total Bilirubin 0.3 - 1.2 mg/dL - - 1.0   CBC Latest Ref Rng & Units 02/18/2019 02/17/2019 02/16/2019  WBC 4.0 - 10.5 K/uL 6.5 7.9 7.9  Hemoglobin 12.0 - 15.0 g/dL 8.4(L) 8.7(L) 7.4(L)  Hematocrit 36.0 - 46.0 % 26.9(L) 28.3(L) 24.5(L)  Platelets 150 - 400 K/uL 204 207 219   Lipid Panel     Component Value Date/Time   CHOL 134 02/15/2019 1243   TRIG 85 02/15/2019 1243   HDL 38 (L) 02/15/2019 1243   CHOLHDL 3.5 02/15/2019 1243   VLDL 17 02/15/2019 1243   LDLCALC 79 02/15/2019 1243   LDLDIRECT 79 10/22/2011 1448   Cardiac Panel (last 3 results) No results for input(s): CKTOTAL, CKMB, TROPONINI, RELINDX in the last 72 hours.  HEMOGLOBIN A1C Lab Results  Component Value Date   HGBA1C 6.4 (H) 02/15/2019  MPG 136.98 02/15/2019   TSH Recent Labs    08/28/18 0412 10/08/18 1139 02/15/19 1243  TSH 7.322* 6.769* 6.560*   Imaging: US Abdomen Limited  Result Date: 02/16/2019 CLINICAL DATA:  Abdominal distension. EXAM: LIMITED ABDOMEN ULTRASOUND FOR ASCITES TECHNIQUE: Limited ultrasound survey for ascites was performed in all four abdominal quadrants. COMPARISON:  None. FINDINGS: Small ascites visualized in all 4 quadrants of the abdomen. IMPRESSION: Small ascites. Electronically Signed   By: Titus Dubin M.D.   On: 02/16/2019 13:35    Cardiac Studies:  Echocardiogram 02/17/2019:  EF 30-35%, Septal motion consistent with RV pressure and volume overload. Mod RV dil and RV systolic dysfunction. Severe AS amd  moderate to severe MT and Moderate TR and moderate PH. No significant change from previous.  Nocturnal oximetry 03/07/2017: No significant hypoxemia.  left + right Heart catheterization 11/24/2013: Moderate pulmonary hypertension, PA pressure 46/18 mmHg. Cardiac output 3.96, Cardiac index 2.2. SVG to RCA ostial 90%, patent free RIMA to RI, SVG to OM1, LIMA to LAD. Aortic valve area 1.1 cm, Mean gradient of 14 mmHg. Moderate to severe mitral regu  Scheduled Meds: . apixaban  2.5 mg Oral BID  . darbepoetin (ARANESP) injection - NON-DIALYSIS  100 mcg Subcutaneous Q Tue-1800  . furosemide  80 mg Intravenous TID  . isosorbide-hydrALAZINE  1 tablet Oral TID  . metolazone  5 mg Oral Daily  . metoprolol tartrate  12.5 mg Oral BID  . multivitamin with minerals  1 tablet Oral Daily  . polyethylene glycol  17 g Oral BID  . rosuvastatin  20 mg Oral Daily  . senna  1 tablet Oral BID  . vitamin B-12  1,000 mcg Oral Daily   Continuous Infusions: PRN Meds:.acetaminophen, albuterol, diclofenac Sodium, gabapentin, lidocaine  Assessment/Plan:  1.  Acute on chronic systolic and diastolic heart failure involving both the right ventricle and left ventricle now predominantly right-sided heart failure with elevated JVD, ascites and also leg edema. 2.  Acute on chronic stage IV kidney disease, worsening serum creatinine. 3.  Failure to thrive in adult. 4.  Multivalve a heart disease including moderate to severe aortic stenosis and moderate to severe until regurgitation, not felt to be a surgical candidate. 5.  DO NOT RESUSCITATE status.   EKG 02/15/2019: Atrial fibrillation with controlled ventricular response at the rate of 66 bpm, inferior infarct old.  Poor R-wave progression, cannot exclude anteroseptal infarct old.  Nonspecific IVCD.  Nonspecific T abnormality. No significant change from  Prior EKG.  Recommendation:  She is diuresing well and hence continue present dose IV diuretics for another 24  hours. Discontinue Eliquis. Anemia panel seem to suggest iron defeciency as well, consider Iron transfusion if appropriate.  Continue present dose metoprolol and also BiDil. Edema almost resolved and abdominal distention improved.  See prelim echo report. In my note (awaiting more data prior to finalizing) Palliative care and hospice care with survival <6 months probably in view of underlying medical irreversible issues including multivalvular heart disease, renal disease and severe anemia and deconditioning.  [name], M.D. 02/18/2019, 9:39 AM [facility] Pager: [phone]

## 2019-02-18 NOTE — Progress Notes (Signed)
Family Medicine Teaching Service Daily Progress Note Intern Pager: [phone]  Patient name: Jodi [name] Medical record number: [id number] Date of birth: Aug 15, 1932 Age: 86 y.o. Gender: female  Primary Care Provider: [name], MD Consultants: cardiology, nephrology  Code Status: DNR   Pt Overview and Major Events to Date:  02/15/19: Admitted, nephrology and cardiology consulted  02/16/19: 1 unit PRBCs transfused   Assessment and Plan: Jodi [name] is a 86 y.o. female who presented with abdominal distention and 5 falls in last 4 days . PMH is significant for history of heart failure,hyperlipidemia,hypertension,CAD, CABG x2,AFIB,aortic stenosis, MR,CKD,OAand is admitted forworsening shortness of breath and lower extremity edema.Admitted for acute exacerbation of CHF.    Acute combined systolic and diastolic congestive heart failure Ascites  Patients BP's are stable.  Weight down 5.2kg since admission, Is/Os net down ~3.8 L.  UOP 1L yesterday s/p 80 mg IV Lasix. Trace LE edema. Patient will need to transition to oral Lasix. Will follow up Dr. Irven Shelling medication recommendations. Considering discharge tomorrow as patient is nearly optimized. Per palliative, patient can't have PT/OT home health and hospice.  Rolling walker and 3 in 1 ordered.  -Cardiology following, appreciate recommendations -Nephrology consulted, appreciate recommendations -Palliative following, appreciate recommendations -BiDil 1 tablet 3 times daily -80 mg IV Lasix 3 times daily, transition to oral  -Lopressor 12.5 mg twice daily -Metolazone 5 mg -Strict I's and O's -Daily weights -PT/OT: Home health PT/OT (canceled) rolling walker with 5 inch wheels, 3 in 1 (ordered)  -Discontinue Aldactone at discharge   Normocytic anemia  IDA  Anemia of chronic disease  ESA initiated by nephrology. We will order IV Fe as patients iron stores are low as well.   Hemoglobin stable at 8.7 from 8.4  yesterday status post 1 unit PRBCs.  Eliquis discontinued by cardiology.  -Transfusion threshold Hgb < 8 - AM CBC - Aranesp 100 mcg    Multiple falls  Shoulder pain Pain controlled.  Eliquis discontinued. Patient had 5 unwitnessed mechanical falls in the four days prior to arrival. Patient is reasonably independent at home. - Scheduled Tylenol 650 mg q6h  - Voltaren gel 1% PRN  - Lidocaine patch - K pad - PT /OT to evaluate and treat  - PRN Tramadol    AKI  CKD Stage IV Creatinine downtrending to 2.58 from 2.78 yesterday.  Continue monitoring kidney function as patient is not a candidate for dialysis.  Appears patient's new baseline is 2.4. Nephrology does not recommend long-term dialysis given her comorbidities.   -  Nephrology following, appreciate recommendations  -Avoid nephrotoxic agents, attempt to balance renal and cardiac needs for diuresis - Fluid restriction to 1.5 L - Strict I/O - Vitamin D 2000 units - Daily BMP  Constipation Abdominal distention continues.  Patient with 1 charted stool yesterday and 1 charted stool this morning however patient states she had not had a bowel movement after saline enema given last night. - MiraLAX 17 g twice daily - Dulcolax 10 mg daily - Increase senna twice daily -Consider milk and molasses enema  pAFIB Rate controlled. HR 59-72. Considering holding Eliquis.  -Continue home eliquis 2.5 mg twice daily -Continue home metoprolol 12.5 mg twice daily - Follow up CBC    Colloid cyst on CT Family and patient aware. Non obsturctive at this point, would require surgery to remove and patient is poor surgical candidate   Multiple Joint OA - Continue home medication management -Tylenol every 4 hours as needed   Hypercholesteremia Continue  home rosuvastatin 20 mg daily   HTN Home isosorbide hydralazine 20mg -37.5 mg 3 times daily - Reduce to 1 tablet Bidil TID   Insomnia  restless leg Recently discontinued off  Ambien, daughter says that she believes gabapentin has been because of her waking up in the middle the night although her mother is emotionally attached to gabapentin which she says is the only thing that treats her severe restless leg.  We discussed change in effective gabapentin dose with change in renal function. -PRN gabapentin reduced to 100 mg nightly   FEN/GI: Heart healthy diet with fluid restriction, Miralax, Colace, senna.  Status post saline enema PPx: Eliquis   Disposition: Home with home hospice  Subjective:  Jodi [name] had no significant overnight events.    Objective: Temp:  [97.4 F (36.3 C)-98 F (36.7 C)] 97.4 F (36.3 C) (01/14 0357) Pulse Rate:  [52-71] 56 (01/14 0357) Resp:  [15-16] 16 (01/14 0357) BP: (125-136)/(63-91) 125/64 (01/14 0357) SpO2:  [91 %-94 %] 92 % (01/14 0357) Weight:  [67.5 kg] 67.5 kg (01/14 0402)   Physical Exam: GEN: pleasant elderly female, in no acute distress  CV: regular rate, irregularly irregular rhythm, no murmurs appreciated  RESP: no increased work of breathing, bibasilar rales  ABD: Bowel sounds present. Soft, Nontender, Nondistended. MSK: no lower extremity edema, or cyanosis noted SKIN: warm, dry NEURO: grossly normal, moves all extremities appropriately          Laboratory: Recent Labs  Lab 02/17/19 0416 02/18/19 0331 02/19/19 0324  WBC 7.9 6.5 7.4  HGB 8.7* 8.4* 8.7*  HCT 28.3* 26.9* 27.6*  PLT 207 204 183   Recent Labs  Lab 02/15/19 0004 02/16/19 0342 02/17/19 0416 02/18/19 0331 02/19/19 0324  NA 137   < > 138 137 140  K 4.3   < > 3.6 3.3* 3.6  CL 95*   < > 93* 90* 91*  CO2 28   < > 29 31 33*  BUN 115*   < > 118* 118* 113*  CREATININE 2.82*   < > 2.78* 2.53* 2.53*  CALCIUM 9.0   < > 9.5 9.5 9.8  PROT 6.3*  --   --   --   --   BILITOT 1.0  --   --   --   --   ALKPHOS 77  --   --   --   --   ALT 14  --   --   --   --   AST 21  --   --   --   --   GLUCOSE 121*   < > 118* 119* 134*   <  > = values in this interval not displayed.      Imaging/Diagnostic Tests: No results found.   Lyndee Hensen, DO PGY-1, [facility] 02/19/2019 2:33 PM   [place] Intern pager: [phone], text pages welcome

## 2019-02-18 NOTE — Progress Notes (Signed)
Patient stated having stomach pains and feeling constipated. She is passing gas but states feeling bloated. Patient on stool softener and laxatives. Gave patient prune juice. Will page for enema, but patient wants it later if she does not go. Will continue to monitor patient.

## 2019-02-19 DIAGNOSIS — N185 Chronic kidney disease, stage 5: Secondary | ICD-10-CM

## 2019-02-19 DIAGNOSIS — N170 Acute kidney failure with tubular necrosis: Secondary | ICD-10-CM

## 2019-02-19 DIAGNOSIS — R0600 Dyspnea, unspecified: Secondary | ICD-10-CM

## 2019-02-19 DIAGNOSIS — N172 Acute kidney failure with medullary necrosis: Secondary | ICD-10-CM

## 2019-02-19 DIAGNOSIS — I350 Nonrheumatic aortic (valve) stenosis: Secondary | ICD-10-CM

## 2019-02-19 DIAGNOSIS — F411 Generalized anxiety disorder: Secondary | ICD-10-CM

## 2019-02-19 LAB — RENAL FUNCTION PANEL
Albumin: 3.6 g/dL (ref 3.5–5.0)
Anion gap: 16 — ABNORMAL HIGH (ref 5–15)
BUN: 113 mg/dL — ABNORMAL HIGH (ref 8–23)
CO2: 33 mmol/L — ABNORMAL HIGH (ref 22–32)
Calcium: 9.8 mg/dL (ref 8.9–10.3)
Chloride: 91 mmol/L — ABNORMAL LOW (ref 98–111)
Creatinine, Ser: 2.53 mg/dL — ABNORMAL HIGH (ref 0.44–1.00)
GFR calc Af Amer: 20 mL/min — ABNORMAL LOW (ref >60)
GFR calc non Af Amer: 17 mL/min — ABNORMAL LOW (ref >60)
Glucose, Bld: 134 mg/dL — ABNORMAL HIGH (ref 70–99)
Phosphorus: 5.3 mg/dL — ABNORMAL HIGH (ref 2.5–4.6)
Potassium: 3.6 mmol/L (ref 3.5–5.1)
Sodium: 140 mmol/L (ref 135–145)

## 2019-02-19 LAB — CBC
HCT: 27.6 % — ABNORMAL LOW (ref 36.0–46.0)
Hemoglobin: 8.7 g/dL — ABNORMAL LOW (ref 12.0–15.0)
MCH: 25.8 pg — ABNORMAL LOW (ref 26.0–34.0)
MCHC: 31.5 g/dL (ref 30.0–36.0)
MCV: 81.9 fL (ref 80.0–100.0)
Platelets: 183 10*3/uL (ref 150–400)
RBC: 3.37 MIL/uL — ABNORMAL LOW (ref 3.87–5.11)
RDW: 17.4 % — ABNORMAL HIGH (ref 11.5–15.5)
WBC: 7.4 10*3/uL (ref 4.0–10.5)
nRBC: 0 % (ref 0.0–0.2)

## 2019-02-19 LAB — ECHOCARDIOGRAM LIMITED
Height: 63 in
Weight: 2500.8 oz

## 2019-02-19 MED ORDER — SODIUM CHLORIDE 0.9 % IV SOLN
510.0000 mg | Freq: Once | INTRAVENOUS | Status: AC
  Administered 2019-02-19: 510 mg via INTRAVENOUS
  Filled 2019-02-19: qty 17

## 2019-02-19 MED ORDER — FUROSEMIDE 80 MG PO TABS
80.0000 mg | ORAL_TABLET | Freq: Two times a day (BID) | ORAL | Status: DC
  Administered 2019-02-20: 80 mg via ORAL
  Filled 2019-02-19: qty 1

## 2019-02-19 MED ORDER — OXYCODONE HCL 5 MG PO TABS
5.0000 mg | ORAL_TABLET | Freq: Four times a day (QID) | ORAL | Status: DC | PRN
  Administered 2019-02-20 (×2): 5 mg via ORAL
  Filled 2019-02-19 (×2): qty 1

## 2019-02-19 MED ORDER — ALPRAZOLAM 0.25 MG PO TABS
0.2500 mg | ORAL_TABLET | Freq: Three times a day (TID) | ORAL | Status: DC | PRN
  Administered 2019-02-19: 0.25 mg via ORAL
  Filled 2019-02-19: qty 1

## 2019-02-19 NOTE — Progress Notes (Signed)
Family Medicine Teaching Service Daily Progress Note Intern Pager: [phone]  Patient name: RIVER MCKERCHER Medical record number: [id number] Date of birth: [birth year] Age: 86 y.o. Gender: female  Primary Care Provider: [name], MD Consultants: cardiology, nephrology  Code Status: DNR   Pt Overview and Major Events to Date:  02/15/19: Admitted, nephrology and cardiology consulted  02/16/19: 1 unit PRBCs transfused   Assessment and Plan: VALENCIA KASSA is a 86 y.o. female who presented with abdominal distention and 5 falls in last 4 days . PMH is significant for history of heart failure,hyperlipidemia,hypertension,CAD, CABG x2,AFIB,aortic stenosis, MR,CKD,OAand is admitted forworsening shortness of breath and lower extremity edema.Admitted for acute exacerbation of CHF.    Acute combined systolic and diastolic congestive heart failure Ascites  Patient's BP are stable.  Weight down 5.2 kg since admission with net I/O down ~4.4 L.  UOP 1.2L yesterday s/p 2x 80mg  IV Lasix.  Per cardiology, discontinue metroprolol and aldactone. Patient will transition to 80mg  BID oral lasix today. Continue 1 tablet Bidil TID and metolazone. Per palliative, patient to go home with home hospice.  Rolling 3 in 1 ordered. Patient is medically optimized and ready for discharge home with hospice care.   -Cardiology following, appreciate recommendations -Nephrology consulted, appreciate recommendations -Palliative following, appreciate recommendations -Continue BiDil 1 tablet 3 times daily -Lasix 80 mg oral BID  -Discontinued Lopressor 12.5 mg twice daily -Metolazone 5 mg -Strict I's and O's -Daily weights -PT/OT: Home health PT/OT (canceled), rolling walker with 5 inch wheels, 3 in 1 (ordered)  -Discontinue Aldactone and Metoprolol at discharge   Normocytic anemia  IDA  Anemia of chronic disease  ESA initiated by nephrology. Patient given IV iron. Hemoglobin stable at  yesterday at 8.7 status post 1 unit pRBCs. -Transfusion threshold Hgb < 8 - AM CBC cancelled  - Aranesp 100 mcg    Multiple falls  Shoulder pain  Pain controlled. Eliquis discontinued. Patient had 5 unwitnessed mechanical falls in the four days prior to arrival. Patient is reasonably independent at home. - Scheduled Tylenol 650 mg q6h  - Oxycodone 5 mg q6hr  - Voltaren gel 1% PRN  - Lidocaine patch - K pad - PT /OT to evaluate and treat  - PRN Tramadol    AKI  CKD Stage IV Creatine stable at 2.5 yesterday.  Per nephrology, patient is not a candidate for dialysis given her comorbidites.  Appears patient's new baseline is 2.4.  -  Nephrology following, appreciate recommendations  -Avoid nephrotoxic agents, attempt to balance renal and cardiac needs for diuresis - Fluid restriction to 1.5 L - Strict I/O - Vitamin D 2000 units - Daily BMP - Aranesp   Constipation Improved. Abdomin soft s/p milk and molasses / saline enemas, Miralax and Senna. Pt had 2 stools yesterday.  - MiraLAX 17 g twice daily - Dulcolax 10 mg daily - Increase senna twice daily - s/p milk and molasses enema - s/p saline enema   pAFIB Rate controlled. HR 59-73 yesterday.  - Eliquis and Metoprolol discontinued.    Colloid cyst on CT Family and patient aware. Non obsturctive at this point, would require surgery to remove and patient is poor surgical candidate.    Multiple Joint OA Pain controlled. See regimen above    Hypercholesteremia Continue home rosuvastatin 20 mg daily  - Consider discontinuing at discharge    HTN Home isosorbide hydralazine 20mg -37.5 mg 3 times daily - Continue reduced 1 tablet Bidil TID at discharge   Insomnia  restless  leg Recently discontinued off Ambien, daughter says that she believes gabapentin has been because of her waking up in the middle the night although her mother is emotionally attached to gabapentin which she says is the only thing that treats her  severe restless leg.  We discussed change in effective gabapentin dose with change in renal function. -PRN gabapentin reduced to 100 mg nightly  FEN/GI: regular diet with fluid restriction, Miralax, senna.   PPx: None, Consider SCDs   Disposition: Home today   Subjective:  WILLODENE STALLINGS had no significant overnight events.   Objective: Temp:  [97.6 F (36.4 C)-98 F (36.7 C)] 98 F (36.7 C) (01/15 0431) Pulse Rate:  [59-73] 71 (01/15 0910) Resp:  [15-16] 16 (01/15 0910) BP: (111-139)/(47-72) 139/57 (01/15 0910) SpO2:  [95 %-96 %] 95 % (01/15 0910)   Physical Exam:  GEN: pleasant elderly female, in no acute distress  CV: regular rate and irregularly irregular rhythm, no murmurs appreciated  RESP: no increased work of breathing, clear to ascultation bilaterally ABD: Bowel sounds present. Soft, Nontender, Nondistended.  MSK: no lower extremity edema, or cyanosis noted SKIN: warm, dry NEURO: grossly normal, moves all extremities appropriately PSYCH: Normal affect and thought content          Laboratory: Recent Labs  Lab 02/17/19 0416 02/18/19 0331 02/19/19 0324  WBC 7.9 6.5 7.4  HGB 8.7* 8.4* 8.7*  HCT 28.3* 26.9* 27.6*  PLT 207 204 183   Recent Labs  Lab 02/15/19 0004 02/16/19 0342 02/17/19 0416 02/18/19 0331 02/19/19 0324  NA 137   < > 138 137 140  K 4.3   < > 3.6 3.3* 3.6  CL 95*   < > 93* 90* 91*  CO2 28   < > 29 31 33*  BUN 115*   < > 118* 118* 113*  CREATININE 2.82*   < > 2.78* 2.53* 2.53*  CALCIUM 9.0   < > 9.5 9.5 9.8  PROT 6.3*  --   --   --   --   BILITOT 1.0  --   --   --   --   ALKPHOS 77  --   --   --   --   ALT 14  --   --   --   --   AST 21  --   --   --   --   GLUCOSE 121*   < > 118* 119* 134*   < > = values in this interval not displayed.      Imaging/Diagnostic Tests: No results found.   Lyndee Hensen, DO PGY-1, [facility] 02/20/2019 2:04 PM    [place] Intern pager: [phone], text pages welcome

## 2019-02-19 NOTE — Progress Notes (Addendum)
Manufacturing engineer [facility]) Hospital Liaison   Notified by Drema Pry of patient/family request for [facility] services at home after discharge. Chart and patient information under review by [facility] physician. Hospice eligibility pending at this time.   Writer spoke with daughter [name] via telephone to initiate education related to hospice philosophy, services and team approach to care during the Covid pandemic. Patient daughter made aware of limited CNA and volunteer services and that nurses may use telehealth online options for homecare. Patient daugter verbalized understanding of information given. Per discussion, plan is for discharge to home by private car possibly in 1 to 2 days.   Please send signed and completed DNR form home with patient/family. Patient will need prescriptions for discharge comfort medications.   DME needs have been discussed, patient currently has the following equipment in the home: Rolling walker and Bedside commode.Patient/family requests the following DME for delivery to the home: wheelchair and oxygen set up for prn needs. [employer] equipment manager has been notified and will contact [employer] to arrange delivery to the daughter's home address of [address] in [place] with [name] as the contact person to call [phone]  to arrange time of delivery.   [facility] aware of the above. Please notify [facility] when patient is ready to leave the unit at discharge. (Call [phone] or [phone] after 5pm.) [facility] information given to daughter at time of visit.   Please call with any hospice related questions.   Thank you for this referral,   Gar Ponto, RN [facility] Liaison (now found on [place]) [phone]  UPDATE AT 1951 [facility] MD APPROVE [place]

## 2019-02-19 NOTE — Discharge Summary (Addendum)
[facility] Discharge Summary  Patient name: Jodi [name] Medical record number: [id number] Date of birth: [birth year] Age: 86 y.o. Gender: female Date of Admission: 02/15/2019  Date of Discharge: 02/20/19 Admitting Physician: Jodi Hensen, DO  Primary Care Provider: [name], MD Consultants: Cardiology, Nephrology   Indication for Hospitalization: lower extremity swelling, shortness of breath  Discharge Diagnoses/Problem List:  Acute on chronic combined systolic and diastolic congestive heart failure Constipation  Non-obstructive colloid cyst, cranial  Multiple Joint OA CKD Stage IV AFIB Hypercholesteremia  HTN Insomnia Restless Leg Syndrome   Disposition: Home with home hospice  Discharge Condition: Improved and stable   Discharge Exam: GEN: pleasant elderly female, in no acute distress  CV: regular rate and irregularly irregular rhythm, no murmurs appreciated  RESP: no increased work of breathing, bibasilar rales  ABD: Bowel sounds present. Soft, Nontender, Nondistended.  MSK: no lower extremity edema, or cyanosis noted SKIN: warm, dry NEURO: grossly normal, moves all extremities appropriately PSYCH: Normal affect and thought content Taken from Dr. Birdena [name] progress note on the day of discharge    Brief Hospital Course:   Acute on Chronic Combined Congestive Heart Failure Jodi [name] is a 86 y.o. female who presented to the ED with worsening dyspnea, orthopnea and lower extremity edema.  ED work-up revealed BNP ~1300 and chest imaging concerning for worsening heart failure.  Aldactone was held throughout admission.  Dr. Einar [name] was consulted and recommended patient have aggressive diuresis 80 mg IV 3 times daily and metolazone.  Urine output and creatinine were monitored closely.  She was transitioned to oral Lasix prior to discharge.  Patient was near dry weight of about 149 lbs  prior to discharge.  Palliative care  was consulted and spoke with family and patient to determine goals of care. Jodi [name] was medically optimized and appropriate discharge home with hospice.  She has close follow-up scheduled with Dr. Einar [name].   AKI  CKD Stage IV Admission creatinine was 2.8.  Calculated FEUrea indicated likely prerenal etiology. Per nephrology, patient is not a candidate for dialysis given her comorbidites.  Appears patient's new baseline is 2.4-2.5.  Aldactone was discontinued on admission.   IDA  Anemia of chronic disease   On admission, 8.0 however dropped below threshold for transfusion patient received 1 unit pRBCs on 02/16/19.  Patient's daughter, Jodi [name], reported patient had a fair amount of bright red blood in her underwear prior to arrival.  FOBT performed in the ED was negative and patient and family declined additional procedures.  Her hemoglobin remained stable ~8.5  status post 1 unit pRBCs. Aranesp was initiated by nephrology.  Patient received IV Feraheme as well.    AFIB Patient intermittently bradycardic and remained in A. fib throughout her admission. Dr. Einar [name] discontinued metoprolol and Eliquis prior to discharge.     Issues for Follow Up:  1. As patient and family opted to go home with hospice, consider discontinuing primary preventative medications. 2. Patient is to follow up outpatient with Dr. Einar [name] in 1 week. 3. Continue to reach out to patient and  family to fine tune end of life goals.  4. It was recommended, by physical therapy, the patient have a rolling walker with 5 wheels and 3 in 1 commode. Please follow-up if patient obtained these devices.  5. Medication changes: Eliquis, metoprolol and aldactone were discontinued.  BiDil was decreased to 1 tablet 3 times daily, increased Lasix 80 mg twice daily and gabapentin decreased to 100 mg nightly. Palliative  recommended Xanax 0.25mg  TID and oxycodone 5mg  q6h prn for pain and dyspnea.  Short-term courses were prescribed at  discharge.    Significant Procedures: 1 unit pRBC transfusion   Significant Labs and Imaging:  Recent Labs  Lab 02/17/19 0416 02/18/19 0331 02/19/19 0324  WBC 7.9 6.5 7.4  HGB 8.7* 8.4* 8.7*  HCT 28.3* 26.9* 27.6*  PLT 207 204 183   Recent Labs  Lab 02/15/19 0004 02/15/19 0004 02/16/19 0342 02/16/19 0342 02/17/19 0416 02/17/19 0416 02/18/19 0331 02/19/19 0324  NA 137  --  138  --  138  --  137 140  K 4.3   < > 4.1   < > 3.6   < > 3.3* 3.6  CL 95*  --  97*  --  93*  --  90* 91*  CO2 28  --  29  --  29  --  31 33*  GLUCOSE 121*  --  122*  --  118*  --  119* 134*  BUN 115*  --  122*  --  118*  --  118* 113*  CREATININE 2.82*  --  3.10*  --  2.78*  --  2.53* 2.53*  CALCIUM 9.0  --  9.0  --  9.5  --  9.5 9.8  PHOS  --   --   --   --  6.2*  --  5.7* 5.3*  ALKPHOS 77  --   --   --   --   --   --   --   AST 21  --   --   --   --   --   --   --   ALT 14  --   --   --   --   --   --   --   ALBUMIN 3.6  --   --   --  3.4*  --  3.5 3.6   < > = values in this interval not displayed.      Results/Tests Pending at Time of Discharge:   Discharge Medications:  Allergies as of 02/20/2019      Reactions   Sulfamethoxazole-trimethoprim Other (See Comments)   Caused shaking and chills   Tape Other (See Comments)   Tears skin.  Please use "paper" tape      Medication List    STOP taking these medications   Eliquis 2.5 MG Tabs tablet Generic drug: apixaban   metoprolol tartrate 25 MG tablet Commonly known as: LOPRESSOR   spironolactone 25 MG tablet Commonly known as: ALDACTONE     TAKE these medications   ALPRAZolam 0.25 MG tablet Commonly known as: XANAX Take 1 tablet (0.25 mg total) by mouth 3 (three) times daily as needed for anxiety.   BiDil 20-37.5 MG tablet Generic drug: isosorbide-hydrALAZINE Take 1 tablet by mouth 3 (three) times daily. What changed: how much to take   furosemide 40 MG tablet Commonly known as: LASIX Take 2 tablets (80 mg total) by  mouth daily for 14 days. What changed: how much to take   gabapentin 100 MG capsule Commonly known as: NEURONTIN Take 1 capsule (100 mg total) by mouth at bedtime as needed (for restless leg). What changed:   how much to take  how to take this  when to take this  reasons to take this  additional instructions   metolazone 5 MG tablet Commonly known as: ZAROXOLYN Take 1 tablet (5 mg total) by mouth daily at 2 PM.  multivitamin with minerals Tabs tablet Take 1 tablet by mouth daily.   oxyCODONE 5 MG immediate release tablet Commonly known as: Oxy IR/ROXICODONE Take 1 tablet (5 mg total) by mouth every 6 (six) hours as needed for up to 5 days for moderate pain (dyspnea).   polyethylene glycol 17 g packet Commonly known as: MIRALAX / GLYCOLAX Take 17 g by mouth 2 (two) times daily.   PreserVision AREDS 2+Multi Vit Caps Take 1 capsule by mouth 2 (two) times daily.   ProAir HFA 108 (90 Base) MCG/ACT inhaler Generic drug: albuterol INHALE 1 PUFF INTO THE LUNGS EVERY 6 HOURS AS NEEDED FOR WHEEZING OR SHORTNESS OF BREATH. What changed: See the new instructions.   rosuvastatin 20 MG tablet Commonly known as: CRESTOR TAKE 1 TABLET BY MOUTH DAILY   vitamin B-12 1000 MCG tablet Commonly known as: CYANOCOBALAMIN Take 1,000 mcg by mouth daily.       Discharge Instructions: Please refer to Patient Instructions section of EMR for full details.  Patient was counseled important signs and symptoms that should prompt return to medical care, changes in medications, dietary instructions, activity restrictions, and follow up appointments.   Follow-Up Appointments: Follow-up Information    [facility] Follow up.   Specialty: Hospice and Palliative Medicine Why: [place]- Registered Nurse- Office to call with visit times Contact information: [place] [address] [phone]        [name], MD. Schedule an appointment as soon  as possible for a visit in 1 week.   Specialty: Cardiology Why: Follow-up appointment is Tuesday, 02/24/19 at 3:30pm with Dr, Jodi [name]. Contact information: [place] [address] [phone]        Jodi [name], [place] on 03/03/2019.   Specialty: Family Medicine Why: at 10:10 with Dr. Nunzio Cobbs information: [place] [address] [phone]           Jodi [name], [place] 02/21/2019, 3:13 PM PGY-1, [place]

## 2019-02-19 NOTE — Progress Notes (Signed)
OT Cancellation Note  Patient Details Name: Jodi [name] MRN: [id number] DOB: March 15, 1932   Cancelled Treatment:     Attempted to see patient this PM however patient having meeting with a doctor, will re-attempt as time permits.  [place] OT office: [place] 02/19/2019, 12:56 PM

## 2019-02-19 NOTE — Progress Notes (Signed)
Physical Therapy Treatment Patient Details Name: Jodi [name] MRN: [id number] DOB: 1932-07-03 Today's Date: 02/19/2019    History of Present Illness 86yo female with history of recent falls, increased SOB, BLE and abdominal edema. CT head negative. Admitted for frequent falls, acute on chronic CHF with severe aortic stenosis. PMH CVA/TIA, A-fib, MI, macular degeneration, HTN, HLD, CHF, aortic stenosis, CABG, cardiac cath, cardioversion    PT Comments    Patient received sitting at EOB, very fatigued but willing to work with therapy today. Able to complete all functional transfers and gait approximately 13f with RW and min guard but very fatigued, required multiple rest breaks during gait this morning. Did report some dizziness during gait, unable to check BP while ambulating today but upon return to room BP was slightly soft- continue to question if she sometimes experiences orthostatic hypotension when up for extended periods. She politely refuses up to chair due to fatigue and malaise, and was left in bed with all needs met, bed alarm active.     Follow Up Recommendations  Home health PT;Supervision/Assistance - 24 hour     Equipment Recommendations  3in1 (PT);Rolling walker with 5" wheels    Recommendations for Other Services       Precautions / Restrictions Precautions Precautions: Fall Precaution Comments: watch HR/rhythm and SPO2, R shoulder pain Restrictions Weight Bearing Restrictions: No    Mobility  Bed Mobility Overal bed mobility: Needs Assistance Bed Mobility: Sit to Supine       Sit to supine: Supervision   General bed mobility comments: S for safety, line management  Transfers Overall transfer level: Needs assistance Equipment used: Rolling walker (2 wheeled) Transfers: Sit to/from Stand Sit to Stand: Supervision         General transfer comment: S for safety, cues for hand placement but patient insisted on pushing up from  RW  Ambulation/Gait Ambulation/Gait assistance: Min guard Gait Distance (Feet): 60 Feet Assistive device: Rolling walker (2 wheeled) Gait Pattern/deviations: Step-through pattern;Decreased step length - right;Decreased step length - left;Decreased stride length;Trunk flexed Gait velocity: decreased   General Gait Details: much more fatigued today, required multiple standing rest breaks; DOE 2/4 but VSS on room air   Stairs             Wheelchair Mobility    Modified Rankin (Stroke Patients Only)       Balance Overall balance assessment: Needs assistance;History of Falls Sitting-balance support: No upper extremity supported;Feet supported Sitting balance-Leahy Scale: Good     Standing balance support: Bilateral upper extremity supported;During functional activity Standing balance-Leahy Scale: Poor Standing balance comment: reilant on BUE support, history of falls                            Cognition Arousal/Alertness: Awake/alert Behavior During Therapy: Flat affect Overall Cognitive Status: Within Functional Limits for tasks assessed                                 General Comments: cognition appears intact, HOH which did limit communication slightly, very flat today      Exercises      General Comments General comments (skin integrity, edema, etc.): VSS on room air      Pertinent Vitals/Pain Pain Assessment: No/denies pain Pain Score: 0-No pain Pain Intervention(s): Limited activity within patient's tolerance;Monitored during session    Home Living  Prior Function            PT Goals (current goals can now be found in the care plan section) Acute Rehab PT Goals Patient Stated Goal: less shoulder pain PT Goal Formulation: With patient Time For Goal Achievement: 03/02/19 Potential to Achieve Goals: Good Progress towards PT goals: Progressing toward goals    Frequency    Min  3X/week      PT Plan Current plan remains appropriate    Co-evaluation              AM-PAC PT "6 Clicks" Mobility   Outcome Measure  Help needed turning from your back to your side while in a flat bed without using bedrails?: None Help needed moving from lying on your back to sitting on the side of a flat bed without using bedrails?: None Help needed moving to and from a bed to a chair (including a wheelchair)?: A Little Help needed standing up from a chair using your arms (e.g., wheelchair or bedside chair)?: A Little Help needed to walk in hospital room?: A Little Help needed climbing 3-5 steps with a railing? : A Lot 6 Click Score: 19    End of Session   Activity Tolerance: Patient limited by fatigue Patient left: in bed;with call bell/phone within reach;with bed alarm set   PT Visit Diagnosis: Unsteadiness on feet (R26.81);Muscle weakness (generalized) (M62.81);History of falling (Z91.81)     Time: 8144-8185 PT Time Calculation (min) (ACUTE ONLY): 25 min  Charges:  $Gait Training: 8-22 mins $Therapeutic Activity: 8-22 mins                     Windell Norfolk, DPT, PN1   Supplemental Physical Therapist [place]    Pager [phone] Acute Rehab Office [phone]

## 2019-02-19 NOTE — Progress Notes (Signed)
PCP Visit Note  Really appreciate the care of out inpt team. Palliative care and Dr Einar Gip  She is in fair spirits a bit tired after being up and walking with Physical Therapy  Alert and cognizant of her situation and caregivers   Seems accepting of, but not happy with, plans to not live alone  Wishes to discuss further with her family  Continue to medically manage symptoms and setting up with hospice  I will be happy to follow as otpt and work with hospice as they need  [employer]

## 2019-02-19 NOTE — TOC Initial Note (Signed)
Transition of Care [facility]) - Initial/Assessment Note    Patient Details  Name: Jodi [name] MRN: [id number] Date of Birth: Jun 25, 1932  Transition of Care [facility]) CM/SW Contact:    Bethena Roys, RN Phone Number: 02/19/2019, 3:00 PM  Clinical Narrative: Patient presented due to a fall. Prior to arrival from home- plan to return home to her daughters house post hospitalization. Case Manager received a referral for Home Hospice. Case Manager reached out to daughter [name] and patient will go to daughters address at [place] [address] once stable. Daughter knew she wanted to use [facility] for [facility]. Referral was made and [facility] is following the patient. Patient will need Durable medical equipment Wheelchair, shower bench and Hospice to assess oxygen needs. Per daughter patient will transition home via private vehicle. No further needs from Case Manager at this time.                 Expected Discharge Plan: Home w Hospice Care Barriers to Discharge: No Barriers Identified   Patient Goals and CMS Choice Patient states their goals for this hospitalization and ongoing recovery are:: return home with hospice   Choice offered to / list presented to : Adult Children(Family wanted to [facility] no list for choice provided.)  Expected Discharge Plan and Services Expected Discharge Plan: [place] In-house Referral: Hospice / Palliative Care Discharge Planning Services: CM Consult Post Acute Care Choice: Durable Medical Equipment, Hospice Living arrangements for the past 2 months: Single Family Home                   HH Arranged: RN [facility] Agency: [facility] and [place] Date [facility] Contacted: 02/19/19 Time [place]: [place] Representative spoke with at [place]: [place]  Prior Living Arrangements/Services Living arrangements for the past 2 months: Single Family  Home Lives with:: Self(Plan will be to return home with daughter.) Patient language and need for interpreter reviewed:: Yes        Need for Family Participation in Patient Care: Yes (Comment) Care giver support system in place?: Yes (comment)   Criminal Activity/Legal Involvement Pertinent to Current Situation/Hospitalization: No - Comment as needed  Activities of Daily Living      Permission Sought/Granted Permission sought to share information with : Family Supports, Investment banker, corporate granted to share info w AGENCY: Engineer, maintenance        Emotional Assessment Appearance:: Appears stated age   Affect (typically observed): Appropriate Orientation: : Oriented to Self, Oriented to Place, Oriented to  Time, Oriented to Situation Alcohol / Substance Use: Not Applicable Psych Involvement: No (comment)  Admission diagnosis:  Acute on chronic combined systolic and diastolic heart failure (HCC) [I50.43] Normocytic anemia [D64.9] Chronic anticoagulation [Z79.01] Falling [R29.6] Chronic atrial fibrillation (HCC) [I48.20] Acute on chronic renal insufficiency [N28.9, N18.9] Acute on chronic kidney failure ([place]) [N17.9, N18.9] Heart failure ([place]) [I50.9] Patient Active Problem List   Diagnosis Date Noted  . Anxiety state   . DNR (do not resuscitate)   . Ascites   . Chronic atrial fibrillation ([place])   . Palliative care by specialist   . Acute on chronic renal insufficiency   . Falling 02/15/2019  . Heart failure ([place]) 02/15/2019  . Acute on chronic kidney failure ([place])   . Chronic anticoagulation   . Normocytic anemia   . Gait instability 01/21/2019  .  Restless legs 12/17/2018  . Acute on chronic combined systolic and diastolic heart failure ([place]) 08/26/2018  . Goals of care, counseling/discussion 02/14/2018  . Recurrent UTI 01/27/2018  . Acute on chronic diastolic (congestive) heart failure ([place]) 12/04/2016  . Thyroid nodule 08/21/2016   . Hyperglycemia 11/09/2015  . Dyspnea 03/05/2015  . Chronic combined systolic and diastolic congestive heart failure ([place]) 03/04/2015  . Left renal mass   . Paroxysmal atrial fibrillation ([place]) 08/20/2013  . Renal cell carcinoma ([place]) 09/23/2012  . Chronic kidney disease (CKD), stage III (moderate) ([place]) 02/20/2012  . Aortic stenosis, severe 12/06/2010  . SEBORRHEA 11/10/2007  . Insomnia 06/09/2007  . HYPERCHOLESTEROLEMIA 04/04/2006  . HYPERTENSION, BENIGN SYSTEMIC 04/04/2006  . CORONARY, ARTERIOSCLEROSIS 04/04/2006  . Acute combined systolic and diastolic heart failure ([place]) 04/04/2006  . Osteoarthritis involving multiple joints on both sides of body 04/04/2006   PCP:  [name], MD Pharmacy:   [facility], [address] [place] [address] Phone: [phone] Fax: [phone]     Social Determinants of Health (SDOH) Interventions    Readmission Risk Interventions Readmission Risk Prevention Plan 02/19/2019  Transportation Screening Complete  Medication Review ([place]) Complete  HRI or [place] Complete  SW Recovery Care/Counseling Consult Complete  Palliative Care Screening Complete  [place] Not Applicable  Some recent data might be hidden

## 2019-02-19 NOTE — Progress Notes (Signed)
Daily Progress Note   Patient Name: Jodi [name]       Date: 02/17/2019 DOB: [birth year]  Age: 86 y.o. MRN#: [id number] Attending Physician: Zenia Resides, MD Primary Care Physician: [name], MD Admit Date: 02/15/2019  Reason for Consultation/Follow-up: Establishing goals of care  Subjective: Patient awake, alert, oriented but appears anxious and states "I'm ready to go home." She reports that she feels anxious and restless. She is attempting to eat bites for lunch. Discussed low-dose anxiety medication which patient is willing to try. She is ok with me calling her daughter to further discuss discharge plan for home hospice.   GOC:  F/u with daughter, Jodi [name] via telephone to discuss plan of care. Again reviewed course of hospitalization including diagnoses, interventions, plan of care and recommendations from attending and Dr. Einar Gip. Discussed poor prognosis and eligibility for hospice services (6 months or less). Explained that her prognosis is likely months if not week with worsening heart failure and now cardiorenal syndrome. The patient has seven children. Encouraged the daughter to prepare her siblings that their mother's time is short and also update them on her EOL wishes. Copies of MOST form made for all 7 children on 02/17/19.  Discussed hospice philosophy and emphasis on comfort, quality of life, and symptom management especially as she nears EOL. Daughter is eager to get her mother home with support of hospice services, hopefully in the next few days. "We don't want her to die there." Reassured daughter that I will reach out to [facility] team to place hospice referral and assist with getting services in place at home.   Educated on EOL expectations. Discussed symptom  management medications inpatient that I will recommend continuing under hospice services when discharged. Jodi [name] agrees with symptom management medications. Reassured Jodi [name] that if symptoms worsen and cannot be managed at home, hospice staff will discuss transfer to hospice facility if necessary.   Again discussed MOST form and her mother's EOL wishes and decisions that have been clearly documented. Jodi [name] shares that her mother is "ready now" and "tired." Emotional support provided.   Answered all questions and concerns. Jodi [name] has PMT contact information.   Length of Stay: 4  Current Medications: Scheduled Meds:  . acetaminophen  650 mg Oral Q6H  . darbepoetin (ARANESP) injection - NON-DIALYSIS  100 mcg Subcutaneous  Q Tue-1800  . furosemide  80 mg Intravenous TID  . isosorbide-hydrALAZINE  1 tablet Oral TID  . metolazone  5 mg Oral Daily  . milk and molasses  1 enema Rectal Once  . multivitamin with minerals  1 tablet Oral Daily  . polyethylene glycol  17 g Oral BID  . rosuvastatin  20 mg Oral Daily  . senna  1 tablet Oral BID  . vitamin B-12  1,000 mcg Oral Daily    Continuous Infusions: . ferumoxytol      PRN Meds: albuterol, ALPRAZolam, diclofenac Sodium, gabapentin, lidocaine, oxyCODONE  Physical Exam Vitals and nursing note reviewed.  Constitutional:      General: She is awake.     Appearance: She is ill-appearing.  HENT:     Head: Normocephalic and atraumatic.  Cardiovascular:     Rate and Rhythm: Bradycardia present.  Pulmonary:     Effort: No tachypnea, accessory muscle usage or respiratory distress.     Comments: Dyspnea with exertion  Abdominal:     Tenderness: There is no abdominal tenderness.  Skin:    General: Skin is warm and dry.     Coloration: Skin is pale.  Neurological:     Mental Status: She is alert and oriented to person, place, and time.  Psychiatric:        Attention and Perception: Attention normal.        Mood and Affect: Mood is anxious.         Speech: Speech normal.        Behavior: Behavior normal.        Cognition and Memory: Cognition normal.     Comments: frustrated            Vital Signs: BP 125/64 (BP Location: Left Arm)   Pulse (!) 56   Temp (!) 97.4 F (36.3 C) (Oral)   Resp 16   Ht 5\' 3"  (1.6 m)   Wt 67.5 kg   SpO2 92%   BMI 26.36 kg/m  SpO2: SpO2: 92 % O2 Device: O2 Device: Room Air O2 Flow Rate:    Intake/output summary:   Intake/Output Summary (Last 24 hours) at 02/19/2019 1357 Last data filed at 02/19/2019 0900 Gross per 24 hour  Intake 240 ml  Output 1600 ml  Net -1360 ml   LBM: Last BM Date: 02/18/19 Baseline Weight: Weight: 72.7 kg Most recent weight: Weight: 67.5 kg       Palliative Assessment/Data: PPS 40%    Flowsheet Rows     Most Recent Value  Intake Tab  Referral Department  Hospitalist  Unit at Time of Referral  Med/Surg Unit  Palliative Care Primary Diagnosis  Cardiac  Palliative Care Type  New Palliative care  Reason for referral  Clarify Goals of Care  Date first seen by Palliative Care  02/16/19  Clinical Assessment  Palliative Performance Scale Score  40%  Psychosocial & Spiritual Assessment  Palliative Care Outcomes  Patient/Family meeting held?  Yes  Who was at the meeting?  patient, daughter via telephone  Palliative Care Outcomes  Clarified goals of care, Counseled regarding hospice, Provided end of life care assistance, Provided psychosocial or spiritual support, ACP counseling assistance      Patient Active Problem List   Diagnosis Date Noted  . DNR (do not resuscitate)   . Ascites   . Chronic atrial fibrillation ([place])   . Palliative care by specialist   . Acute on chronic renal insufficiency   . Falling 02/15/2019  .  Heart failure ([place]) 02/15/2019  . Acute on chronic kidney failure ([place])   . Chronic anticoagulation   . Normocytic anemia   . Gait instability 01/21/2019  . Restless legs 12/17/2018  . Acute on chronic combined systolic and diastolic  heart failure ([place]) 08/26/2018  . Goals of care, counseling/discussion 02/14/2018  . Recurrent UTI 01/27/2018  . Acute on chronic diastolic (congestive) heart failure ([place]) 12/04/2016  . Thyroid nodule 08/21/2016  . Hyperglycemia 11/09/2015  . Chronic combined systolic and diastolic congestive heart failure ([place]) 03/04/2015  . Left renal mass   . Paroxysmal atrial fibrillation ([place]) 08/20/2013  . Renal cell carcinoma ([place]) 09/23/2012  . Chronic kidney disease (CKD), stage III (moderate) ([place]) 02/20/2012  . Aortic stenosis, severe 12/06/2010  . SEBORRHEA 11/10/2007  . Insomnia 06/09/2007  . HYPERCHOLESTEROLEMIA 04/04/2006  . HYPERTENSION, BENIGN SYSTEMIC 04/04/2006  . CORONARY, ARTERIOSCLEROSIS 04/04/2006  . Acute combined systolic and diastolic heart failure ([place]) 04/04/2006  . Osteoarthritis involving multiple joints on both sides of body 04/04/2006    Palliative Care Assessment & Plan   Patient Profile: 86 y.o. female  with past medical history of CHF EF 35-40%, CAD s/p CABG x2, aortic stenosis, mitral regurgitation, CKD stage IV, HLD, HTN, afib, OA admitted on 02/15/2019 with worsening shortness of breath and lower extremity edema. Hospital admission for acute CHF exacerbation with AKI with underlying CKD stage IV with worsening creatinine. Underlying moderate to severe aortic stenosis and mitral regurgitation not felt to be a surgical candidate. Per primary cardiologist Dr. Einar Gip, guarded prognosis and recommending palliative consult for goals of care.   Assessment: Chronic combined systolic and diastolic heart failure AKI on CKD stage IV, likely cardiorenal syndrome Ascites Aortic stenosis/mitral regurgitation not a candidate for valve replacements Normocytic anemia Multiple falls Colloid cyst on CT Multiple joint OA Insomnia/restless leg  Recommendations/Plan:  MOST form completed with patient on 02/17/19. Patient's wishes include DNR/DNI, comfort measures, IVF/ABX for time  trial, and NO feeding tube. She does NOT want surgeries or dialysis. Daughter understands and respects her wishes. Durable DNR completed. Copies of MOST form and durable DNR placed in chart for EMR and 7 copies given to patient for her children.   Patient wishes to return home with support of home hospice services. She does not wish to be re-hospitalized as her health continues to decline. TOC team notified. Patient will discharge to daughter's home, [place].   Continue current plan of care and medical management. Medically optimize inpatient with plan to discharge home with hospice services. Patient and daughter hopeful for discharge home with hospice in the next few days per attending and Dr. Einar Gip.    Symptom management medications. Recommend continuing on discharge. Hospice will manage.  Xanax 0.25mg  PO TID prn anxiety  Oxycodone 5mg  PO q6h prn moderate pain/dyspnea  Gabapentin 100mg  PO HS prn restless leg  Senna and Miralax scheduled daily   Code Status: DNR/DNI   Code Status Orders  (From admission, onward)         Start     Ordered   02/15/19 0512  Do not attempt resuscitation (DNR)  Continuous    Question Answer Comment  In the event of cardiac or respiratory ARREST Do not call a "code blue"   In the event of cardiac or respiratory ARREST Do not perform Intubation, CPR, defibrillation or ACLS   In the event of cardiac or respiratory ARREST Use medication by any route, position, wound care, and other measures to relive pain and suffering. May use  oxygen, suction and manual treatment of airway obstruction as needed for comfort.   Comments Discussed with patient and her son at bedside. Aggresive medical therapy only      02/15/19 0518        Code Status History    Date Active Date Inactive Code Status Order ID Comments User Context   10/07/2018 1705 10/11/2018 1709 DNR [id number]  Lady Deutscher, MD ED   08/26/2018 0525 08/28/2018 1751 DNR [id number]  Patriciaann Clan, DO ED     12/08/2016 0817 08/25/2018 1259 DNR [id number]  [name], MD Inpatient   12/04/2016 1813 12/06/2016 1825 Full Code [id number]  [name], MD ED   04/18/2015 1532 04/19/2015 1904 Full Code [id number]  Nicolette Bang, DO ED   03/05/2015 0112 03/05/2015 1939 Full Code [id number]  Rosemarie Ax, MD Inpatient   09/27/2014 1723 09/30/2014 1640 Full Code [id number]  [name], MD Inpatient   09/27/2014 1656 09/27/2014 1723 Full Code [id number]  Neldon Labella, NP Inpatient   11/24/2013 1045 11/24/2013 1910 Full Code [id number]  [name], MD Inpatient   08/20/2013 2203 08/21/2013 1648 Full Code [id number]  [name], MD ED   Advance Care Planning Activity       Prognosis:   Likely months if not weeks with combined CHF, cardiorenal syndrome, low urine-output, critical aortic stenosis, severe anemia with iron deficiency/anemia of chronic disease. Declining functional status.   Discharge Planning:  Home with Hospice  Care plan was discussed with patient, daughter Jodi [name]), RN, Dr. Susa Simmonds  Thank you for allowing the Palliative Medicine Team to assist in the care of this patient.   Time In: 1300 Time Out: 1350 Total Time 50 Prolonged Time Billed no      Greater than 50%  of this time was spent counseling and coordinating care related to the above assessment and plan.  Ihor Dow, DNP, FNP-C Palliative Medicine Team  Phone: [phone] Fax: [phone]  Please contact Palliative Medicine Team phone at [phone] for questions and concerns.

## 2019-02-19 NOTE — Progress Notes (Signed)
Subjective:  Feels tired. Still constipated but had a small bowel movement. Dyspnea has improved marginally. .   Intake/Output from previous day:  I/O last 3 completed shifts: In: 118 [P.O.:118] Out: 1400 [Urine:1400] Total I/O In: -  Out: 1000 [Urine:1000]  Blood pressure 125/64, pulse (!) 56, temperature (!) 97.4 F (36.3 C), temperature source Oral, resp. rate 16, height 5' 3"  (1.6 m), weight 67.5 kg, SpO2 92 %.   Vitals with BMI 02/19/2019 02/19/2019 02/19/2019  Height - - -  Weight 148 lbs 13 oz - -  BMI 99.37 - -  Systolic - 169 -  Diastolic - 64 -  Pulse - 56 52    Filed Weights   02/17/19 0443 02/18/19 0403 02/19/19 0402  Weight: 70.9 kg 69.3 kg 67.5 kg    Physical Exam  Constitutional: She appears well-developed and well-nourished. No distress.  HENT:  Head: Atraumatic.  Eyes: Conjunctivae are normal.  Neck: No thyromegaly present.  Cardiovascular: Intact distal pulses and normal pulses. An irregularly irregular rhythm present. Exam reveals no gallop, no S3 and no S4.  Murmur heard.  Harsh mid to late systolic murmur is present with a grade of 3/6 at the upper right sternal border radiating to the neck. High-pitched blowing holosystolic murmur is also present at the apex. Pulses:      Carotid pulses are on the right side with bruit and on the left side with bruit. S1 is variable and soft, S2 is muffled. JVD half way up the neck and trace pitting leg edema  Pulmonary/Chest: Effort normal and breath sounds normal.  Abdominal: Soft. Bowel sounds are normal. She exhibits distension (improved from yesterday). There is no abdominal tenderness.  Musculoskeletal:        General: Normal range of motion.     Cervical back: Neck supple.  Neurological: She is alert.  Skin: Skin is warm and dry.  Psychiatric: She has a normal mood and affect.   Lab Results: BMP BNP (last 3 results) Recent Labs    10/22/18 1639 12/23/18 1854 02/15/19 0004  BNP 791.1* 1,158.0*  1,319.8*    ProBNP (last 3 results) No results for input(s): PROBNP in the last 8760 hours. BMP Latest Ref Rng & Units 02/19/2019 02/18/2019 02/17/2019  Glucose 70 - 99 mg/dL 134(H) 119(H) 118(H)  BUN 8 - 23 mg/dL 113(H) 118(H) 118(H)  Creatinine 0.44 - 1.00 mg/dL 2.53(H) 2.53(H) 2.78(H)  BUN/Creat Ratio 12 - 28 - - -  Sodium 135 - 145 mmol/L 140 137 138  Potassium 3.5 - 5.1 mmol/L 3.6 3.3(L) 3.6  Chloride 98 - 111 mmol/L 91(L) 90(L) 93(L)  CO2 22 - 32 mmol/L 33(H) 31 29  Calcium 8.9 - 10.3 mg/dL 9.8 9.5 9.5   Hepatic Function Latest Ref Rng & Units 02/19/2019 02/18/2019 02/17/2019  Total Protein 6.5 - 8.1 g/dL - - -  Albumin 3.5 - 5.0 g/dL 3.6 3.5 3.4(L)  AST 15 - 41 U/L - - -  ALT 0 - 44 U/L - - -  Alk Phosphatase 38 - 126 U/L - - -  Total Bilirubin 0.3 - 1.2 mg/dL - - -   CBC Latest Ref Rng & Units 02/19/2019 02/18/2019 02/17/2019  WBC 4.0 - 10.5 K/uL 7.4 6.5 7.9  Hemoglobin 12.0 - 15.0 g/dL 8.7(L) 8.4(L) 8.7(L)  Hematocrit 36.0 - 46.0 % 27.6(L) 26.9(L) 28.3(L)  Platelets 150 - 400 K/uL 183 204 207   Lipid Panel     Component Value Date/Time   CHOL 134 02/15/2019 1243  TRIG 85 02/15/2019 1243   HDL 38 (L) 02/15/2019 1243   CHOLHDL 3.5 02/15/2019 1243   VLDL 17 02/15/2019 1243   LDLCALC 79 02/15/2019 1243   LDLDIRECT 79 10/22/2011 1448   Cardiac Panel (last 3 results) No results for input(s): CKTOTAL, CKMB, TROPONINI, RELINDX in the last 72 hours.  HEMOGLOBIN A1C Lab Results  Component Value Date   HGBA1C 6.4 (H) 02/15/2019   MPG 136.98 02/15/2019   TSH Recent Labs    08/28/18 0412 10/08/18 1139 02/15/19 1243  TSH 7.322* 6.769* 6.560*   Imaging:   Cardiac Studies: Echocardiogram 02/17/2019:    1. Left ventricular ejection fraction, by visual estimation, is 30 to 35%. The left ventricle has severely decreased function. Left ventricular diastolic parameters are indeterminate. Septal motion consistent with Right ventricular volume and pressure  overload. Left  ventricular ejection fraction by 3D volume is is 34 %.  2. Global right ventricle has mildly reduced systolic function.The right ventricular size is moderately enlarged. no increase in right ventricular wall thickness.  3. Left atrial size was severely dilated.  4. Right atrial size was moderately dilated.  5. Moderate mitral annular calcification. Moderate to severe posteriorly directed mitral valve regurgitation. No evidence of mitral stenosis.  6. Severe aortic valve annular calcification. Severe calcification of the posterior mitral valve leaflet(s). Aortic valve mean gradient measures 28.7 mmHg. Aortic valve peak gradient measures 50.0 mmHg. Critical aortic valve stenosis. Severity may be  overestimated due to low COP. AV Area (Vmax): 0.59 cm AV Area (Vmean): 0.54 cm 3.06 cm2 AV Area (VTI): 0.54 cm  7. The tricuspid valve was normal in structure. Tricuspid valve regurgitation moderate-severe.  8. Moderately elevated pulmonary artery systolic pressure.  9. The tricuspid regurgitant velocity is 3.06 m/s, and with an assumed right atrial pressure of 15 mmHg, the estimated right ventricular systolic pressure is moderately elevated at 52.5 mmHg. The inferior vena cava is dilated in size with <50%  respiratory variability, suggesting right atrial pressure of 15 mmHg.  left + right Heart catheterization 11/24/2013: Moderate pulmonary hypertension, PA pressure 46/18 mmHg. Cardiac output 3.96, Cardiac index 2.2. SVG to RCA ostial 90%, patent free RIMA to RI, SVG to OM1, LIMA to LAD. Aortic valve area 1.1 cm, Mean gradient of 14 mmHg. Moderate to severe mitral regu  Scheduled Meds: . acetaminophen  650 mg Oral Q6H  . darbepoetin (ARANESP) injection - NON-DIALYSIS  100 mcg Subcutaneous Q Tue-1800  . furosemide  80 mg Intravenous TID  . isosorbide-hydrALAZINE  1 tablet Oral TID  . metolazone  5 mg Oral Daily  . metoprolol tartrate  12.5 mg Oral BID  . milk and molasses  1 enema Rectal Once  .  multivitamin with minerals  1 tablet Oral Daily  . polyethylene glycol  17 g Oral BID  . rosuvastatin  20 mg Oral Daily  . senna  1 tablet Oral BID  . vitamin B-12  1,000 mcg Oral Daily   Continuous Infusions: PRN Meds:.albuterol, diclofenac Sodium, gabapentin, lidocaine  Assessment/Plan:  1.  Acute on chronic systolic and diastolic heart failure involving both the right ventricle and left ventricle now predominantly right-sided heart failure with elevated JVD, ascites and also leg edema. 2.  Acute on chronic stage IV kidney disease, worsening serum creatinine. 3.  Failure to thrive in adult. 4.  Multivalve a heart disease including moderate to severe aortic stenosis and moderate to severe until regurgitation, not felt to be a surgical candidate. 5.  DO NOT RESUSCITATE status.  EKG 02/15/2019: Atrial fibrillation with controlled ventricular response at the rate of 66 bpm, inferior infarct old.  Poor R-wave progression, cannot exclude anteroseptal infarct old.  Nonspecific IVCD.  Nonspecific T abnormality. No significant change from  Prior EKG.  Recommendation:  Discontinue Metoprolol. It will not make a difference. She has critical AS, severe renal failure, severe anemia with iron deficiency along with anemia of chronic disease, irreparable valvular heart disease.  Comfort, palliative and hospice is appropriate, cannot live independently. Have discontinued eliquis  [name], M.D. 02/19/2019, 6:22 AM [facility] Pager: [phone]

## 2019-02-19 NOTE — Care Management Important Message (Signed)
Important Message  Patient Details  Name: Jodi [name] MRN: [id number] Date of Birth: 06-Apr-1932   Medicare Important Message Given:  Yes     [name] 02/19/2019, 1:04 PM

## 2019-02-20 ENCOUNTER — Other Ambulatory Visit: Payer: Medicare Other | Admitting: Internal Medicine

## 2019-02-20 ENCOUNTER — Telehealth: Payer: Self-pay

## 2019-02-20 ENCOUNTER — Telehealth: Payer: Self-pay | Admitting: Cardiology

## 2019-02-20 MED ORDER — POLYETHYLENE GLYCOL 3350 17 G PO PACK: 17.0000 g | PACK | Freq: Two times a day (BID) | ORAL | 0 refills | Status: DC

## 2019-02-20 MED ORDER — FUROSEMIDE 40 MG PO TABS: 80.0000 mg | ORAL_TABLET | Freq: Every day | ORAL | 0 refills | Status: DC

## 2019-02-20 MED ORDER — OXYCODONE HCL 5 MG PO TABS: 5.0000 mg | ORAL_TABLET | Freq: Four times a day (QID) | ORAL | 0 refills | Status: DC | PRN

## 2019-02-20 MED ORDER — BIDIL 20-37.5 MG PO TABS: 1.0000 | ORAL_TABLET | Freq: Three times a day (TID) | ORAL | 0 refills | Status: AC

## 2019-02-20 MED ORDER — GABAPENTIN 100 MG PO CAPS: 100.0000 mg | ORAL_CAPSULE | Freq: Every evening | ORAL | 0 refills | Status: DC | PRN

## 2019-02-20 MED ORDER — ALPRAZOLAM 0.25 MG PO TABS: 0.2500 mg | ORAL_TABLET | Freq: Three times a day (TID) | ORAL | 0 refills | Status: DC | PRN

## 2019-02-20 NOTE — Telephone Encounter (Signed)
She should not be by herself. If unable to stay with famil;y, then we should look at nursing homes

## 2019-02-20 NOTE — Telephone Encounter (Signed)
-----   Message from [name], MD sent at 02/20/2019 10:36 AM EST ----- Regarding: Visit Please do TOC. Agree with appointment < 1 week  JG

## 2019-02-20 NOTE — Progress Notes (Unsigned)
[facility] Palliative Care Consult Note Telephone: [phone]  Fax: [phone]  PATIENT NAME: Jodi [name] DOB: 07-15-1932 MRN: [id number]  PRIMARY CARE PROVIDER:   [name], MD  REFERRING PROVIDER:  [name], MD [place],  [place] [address]  RESPONSIBLE PARTY:     ASSESSMENT:        RECOMMENDATIONS and PLAN:  1.  I spent *** minutes providing this consultation,  from *** to ***. More than 50% of the time in this consultation was spent coordinating communication.   HISTORY OF PRESENT ILLNESS:  Jodi [name] is a 86 y.o. year old female with multiple medical problems including ***. Palliative Care was asked to help address goals of care.   CODE STATUS:   PPS: 0% HOSPICE ELIGIBILITY/DIAGNOSIS: TBD  PAST MEDICAL HISTORY:  Past Medical History:  Diagnosis Date  . Aortic stenosis   . Arthritis    "maybe in my fingers and toes" (09/28/2014)  . Bradycardia   . CHF (congestive heart failure) ([place])   . Chronic renal insufficiency, stage III (moderate)    Jodi [name] 09/27/2014  . Coronary artery disease   . Heart murmur   . Hyperlipidemia   . Hypertension   . Macular degeneration, left eye   . Myocardial infarction [facility]) 1995; 2003  . Paroxysmal atrial fibrillation ([facility])   . Renal cell carcinoma ([place]) 09/23/2012  . Renal mass 12/06/2010   CT Abdomen 11-27-10 upper pole region of the right kidney which is suspicious for solid lesion, measuring 1.9 x 1.3 cm. This lesion is concerning for renal cell carcinoma given the solid appearance.  Following with Dr [name].  Renal bx was benign    . Shortness of breath   . Splenic infarct 12/06/2010  . Stroke [facility]) 2003   "when I had heart surgery"; denies residual on 09/28/2014  . TIA (transient ischemic attack) "several"    SOCIAL HX:  Social History   Tobacco Use  . Smoking status: Never Smoker  . Smokeless tobacco: Never Used  Substance Use  Topics  . Alcohol use: No    ALLERGIES:  Allergies  Allergen Reactions  . Sulfamethoxazole-Trimethoprim Other (See Comments)    Caused shaking and chills  . Tape Other (See Comments)    Tears skin.  Please use "paper" tape     PERTINENT MEDICATIONS:  Facility-Administered Encounter Medications as of 02/20/2019  Medication  . acetaminophen (TYLENOL) tablet 650 mg  . albuterol (VENTOLIN HFA) 108 (90 Base) MCG/ACT inhaler 1 puff  . ALPRAZolam (XANAX) tablet 0.25 mg  . Darbepoetin Alfa (ARANESP) injection 100 mcg  . diclofenac Sodium (VOLTAREN) 1 % topical gel 2 g  . furosemide (LASIX) tablet 80 mg  . gabapentin (NEURONTIN) capsule 100 mg  . isosorbide-hydrALAZINE (BIDIL) 20-37.5 MG per tablet 1 tablet  . lidocaine (LIDODERM) 5 % 1 patch  . metolazone (ZAROXOLYN) tablet 5 mg  . multivitamin with minerals tablet 1 tablet  . oxyCODONE (Oxy IR/ROXICODONE) immediate release tablet 5 mg  . polyethylene glycol (MIRALAX / GLYCOLAX) packet 17 g  . rosuvastatin (CRESTOR) tablet 20 mg  . senna (SENOKOT) tablet 8.6 mg  . vitamin B-12 (CYANOCOBALAMIN) tablet 1,000 mcg   Outpatient Encounter Medications as of 02/20/2019  Medication Sig  . BIDIL 20-37.5 MG tablet TAKE 2 TABLETS BY MOUTH 3 TIMES A DAY (Patient taking differently: Take 2 tablets by mouth 3 (three) times daily. )  . ELIQUIS 2.5 MG TABS tablet TAKE 1 TABLET BY  MOUTH 2 TIMES DAILY.  . furosemide (LASIX) 40 MG tablet Take 40 mg by mouth daily.  Marland Kitchen gabapentin (NEURONTIN) 100 MG capsule Use 2-5 caps at bedtime as needed (Patient taking differently: Take 300 mg by mouth at bedtime as needed (neuropathy). Use 3 caps at bedtime as needed)  . metolazone (ZAROXOLYN) 5 MG tablet Take 1 tablet (5 mg total) by mouth daily at 2 PM.  . metoprolol tartrate (LOPRESSOR) 25 MG tablet TAKE 1/2 TABLET BY MOUTH 2 TIMES A DAY. (Patient taking differently: Take 12.5 mg by mouth 2 (two) times daily. )  . Multiple Vitamin (MULTIVITAMIN WITH MINERALS) TABS  tablet Take 1 tablet by mouth daily.  . Multiple Vitamins-Minerals (PRESERVISION AREDS 2+MULTI VIT) CAPS Take 1 capsule by mouth 2 (two) times daily.  Marland Kitchen PROAIR HFA 108 (90 Base) MCG/ACT inhaler INHALE 1 PUFF INTO THE LUNGS EVERY 6 HOURS AS NEEDED FOR WHEEZING OR SHORTNESS OF BREATH. (Patient taking differently: Inhale 1 puff into the lungs every 6 (six) hours as needed for wheezing or shortness of breath. )  . rosuvastatin (CRESTOR) 20 MG tablet TAKE 1 TABLET BY MOUTH DAILY (Patient taking differently: Take 20 mg by mouth daily. )  . spironolactone (ALDACTONE) 25 MG tablet Take 0.5 tablets (12.5 mg total) by mouth daily.  . vitamin B-12 (CYANOCOBALAMIN) 1000 MCG tablet Take 1,000 mcg by mouth daily.    PHYSICAL EXAM:   General: NAD, frail appearing, thin Cardiovascular: regular rate and rhythm Pulmonary: clear ant fields Abdomen: soft, nontender, + bowel sounds GU: no suprapubic tenderness Extremities: no edema, no joint deformities Skin: no rashes Neurological: Weakness but otherwise nonfocal  Julianne Handler, NP

## 2019-02-20 NOTE — Telephone Encounter (Signed)
Location of hospitalization: [place] Reason for hospitalization: Heart Failure Date of discharge: 02/19/18 Date of first communication with patient: today Person contacting patient: Me Current symptoms: SOB Do you understand why you were in the Hospital: Yes Questions regarding discharge instructions: None Where were you discharged to: Home Medications reviewed: Yes Allergies reviewed: Yes Dietary changes reviewed: Yes. Discussed low fat and low salt diet.  Referals reviewed: NA Activities of Daily Living: Able to with mild limitations Any transportation issues/concerns: None Any patient concerns: None Confirmed importance & date/time of Follow up appt: Yes Confirmed with patient if condition begins to worsen call. Pt was given the office number and encouraged to call back with questions or concerns: Yes

## 2019-02-23 ENCOUNTER — Telehealth: Payer: Self-pay

## 2019-02-23 NOTE — Telephone Encounter (Signed)
Location of hospitalization: [facility] Reason for hospitalization: lower extremity swelling, shortness of breath Date of discharge: 02/20/2019 Date of first communication with patient: today Person contacting patient: Cheri Kearns RMA Current symptoms: Feels the same but better, no new symptoms  Do you understand why you were in the Hospital: Yes Questions regarding discharge instructions: None Where were you discharged to: Home Medications reviewed: Yes Allergies reviewed: Yes Dietary changes reviewed: Yes. Discussed low fat and low salt diet.  Referals reviewed: NA Activities of Daily Living: Able to with mild limitations Any transportation issues/concerns: None Any patient concerns: None Confirmed importance & date/time of Follow up appt: Yes Confirmed with patient if condition begins to worsen call. Pt was given the office number and encouraged to call back with questions or concerns: Yes

## 2019-02-24 ENCOUNTER — Other Ambulatory Visit: Payer: Self-pay

## 2019-02-24 ENCOUNTER — Ambulatory Visit (INDEPENDENT_AMBULATORY_CARE_PROVIDER_SITE_OTHER): Admitting: Cardiology

## 2019-02-24 ENCOUNTER — Encounter: Payer: Self-pay | Admitting: Cardiology

## 2019-02-24 VITALS — BP 142/67 | HR 84 | Temp 97.4°F | Resp 16 | Ht 63.0 in | Wt 141.0 lb

## 2019-02-24 DIAGNOSIS — I340 Nonrheumatic mitral (valve) insufficiency: Secondary | ICD-10-CM | POA: Diagnosis not present

## 2019-02-24 DIAGNOSIS — I350 Nonrheumatic aortic (valve) stenosis: Secondary | ICD-10-CM | POA: Diagnosis not present

## 2019-02-24 DIAGNOSIS — I4821 Permanent atrial fibrillation: Secondary | ICD-10-CM

## 2019-02-24 DIAGNOSIS — I5042 Chronic combined systolic (congestive) and diastolic (congestive) heart failure: Secondary | ICD-10-CM

## 2019-02-24 DIAGNOSIS — N184 Chronic kidney disease, stage 4 (severe): Secondary | ICD-10-CM

## 2019-02-24 DIAGNOSIS — D638 Anemia in other chronic diseases classified elsewhere: Secondary | ICD-10-CM
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
## 2019-02-24 NOTE — Patient Instructions (Addendum)
Please do not consume pickles  No hotdogs, no Bacon, No canned soup and also soup made with canned vegetables.  Home made soup is okay, as long as frozen vegetables are used, spices okay to use but very little salt.  No Pizza (one small slice once every 2 weeks only allowed) No Salsa and avoid all dips unless made at home with no salt or very little salt. No Chips  Weight yourself daily and to let us know if > 3Lbs weight gain in 1-2 days. You should chart your weight on a graph sheet daily so as to know the trend. I will see you in 9-10 days.

## 2019-03-03 ENCOUNTER — Inpatient Hospital Stay: Payer: Medicare Other | Admitting: Family Medicine

## 2019-03-03 ENCOUNTER — Telehealth: Payer: Self-pay | Admitting: Family Medicine

## 2019-03-04 NOTE — Telephone Encounter (Signed)
Spoke with Daughter [name].  Overall doing ok Seeing Dr Einar Gip this week Hospice is coming regularly  Taking all medications on medication list in addition To Xanax 0.25 three times a day as needed and  Oxycodone 5 mg at night and as needed   [name] gave additional gabapentin once for sleep and I said this was ok  She will call if any problems

## 2019-03-06 ENCOUNTER — Encounter: Payer: Self-pay | Admitting: Cardiology

## 2019-03-06 ENCOUNTER — Other Ambulatory Visit: Payer: Self-pay

## 2019-03-06 ENCOUNTER — Ambulatory Visit (INDEPENDENT_AMBULATORY_CARE_PROVIDER_SITE_OTHER): Admitting: Cardiology

## 2019-03-06 VITALS — BP 133/61 | HR 62 | Temp 98.0°F | Ht 65.0 in | Wt 138.2 lb

## 2019-03-06 DIAGNOSIS — I5042 Chronic combined systolic (congestive) and diastolic (congestive) heart failure: Secondary | ICD-10-CM

## 2019-03-06 DIAGNOSIS — I129 Hypertensive chronic kidney disease with stage 1 through stage 4 chronic kidney disease, or unspecified chronic kidney disease: Secondary | ICD-10-CM | POA: Diagnosis not present

## 2019-03-06 DIAGNOSIS — I340 Nonrheumatic mitral (valve) insufficiency: Secondary | ICD-10-CM | POA: Diagnosis not present

## 2019-03-06 DIAGNOSIS — Z66 Do not resuscitate: Secondary | ICD-10-CM

## 2019-03-06 DIAGNOSIS — N184 Chronic kidney disease, stage 4 (severe): Secondary | ICD-10-CM | POA: Diagnosis not present
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
## 2019-03-09 ENCOUNTER — Ambulatory Visit: Payer: Medicare Other | Admitting: Cardiology

## 2019-03-27 ENCOUNTER — Telehealth: Payer: Self-pay

## 2019-03-27 NOTE — Telephone Encounter (Signed)
Patient's daughter [name]), calls nurse line regarding back pain. Reports that oxycodone is not helping. Daughter is concerned about possible kidney infection.   Offered patient appointment with another provider as PCP does not have any openings until the middle of March. Daughter says that due to patient being in hospice care she prefers to only see PCP.   To PCP  Please advise  Talbot Grumbling, RN

## 2019-03-27 NOTE — Telephone Encounter (Signed)
Having bilateral mid back pain.  Worse with twisting better when sitting up. When daughter palpates is not tender over muscles or spine No trauma or fever or dysuria or rash Some relief with oxycodone  Suggest this is likely musculoskeletal   If worsening or any of above symptoms to call me Other wise can take oxycodone with tylenol q 6 hours as needed

## 2019-03-31 ENCOUNTER — Other Ambulatory Visit: Payer: Self-pay | Admitting: Family Medicine

## 2019-04-01 ENCOUNTER — Telehealth: Payer: Self-pay

## 2019-04-01 MED ORDER — NITROGLYCERIN 0.4 MG SL SUBL: 0.4000 mg | SUBLINGUAL_TABLET | SUBLINGUAL | 3 refills | Status: AC | PRN

## 2019-04-01 NOTE — Telephone Encounter (Signed)
Angel calls nurse in regards to patient. Patients feet have been really bothering her and gabapentin 100mg  5x a day is not helping. Per [name], the pain is making it difficult for patient to walk and to sleep at night. [name] and pallative nurse are requesting suggestions for patient, upping gabapentin dose and adding xanax for sleep. No apts on your schedule for a virtual for the next week. Please call [name] to discuss.

## 2019-04-01 NOTE — Telephone Encounter (Signed)
Had episode of chest pain on Saturday given oxy and xanax  Now severe pain in her legs  Gabapentin seems to help some  Increase to 3 tabs twice during the day and up to 5 tabs at night along with xanax  If has severe chest pain would recommend SL NTG sent in Rx  Told Lyndsey sp? From [facility] care can call any time

## 2019-04-01 NOTE — Addendum Note (Signed)
Addended by: Talbert Cage L on: 04/01/2019 05:04 PM   Modules accepted: Orders

## 2019-04-01 NOTE — Telephone Encounter (Signed)
Left VM to call back 

## 2019-04-05 IMAGING — DX DG CHEST 2V
2 series · 3 of 3 positions shown · non-contrast
Comparison: 08/25/2016

CLINICAL DATA: Right lower lobe pneumonia.

EXAM:
CHEST  2 VIEW

[Series 1: dg chest 2 view · U · 0.14mm/px · 2 of 2 slices shown (1 of 2)]
[im 1/2]
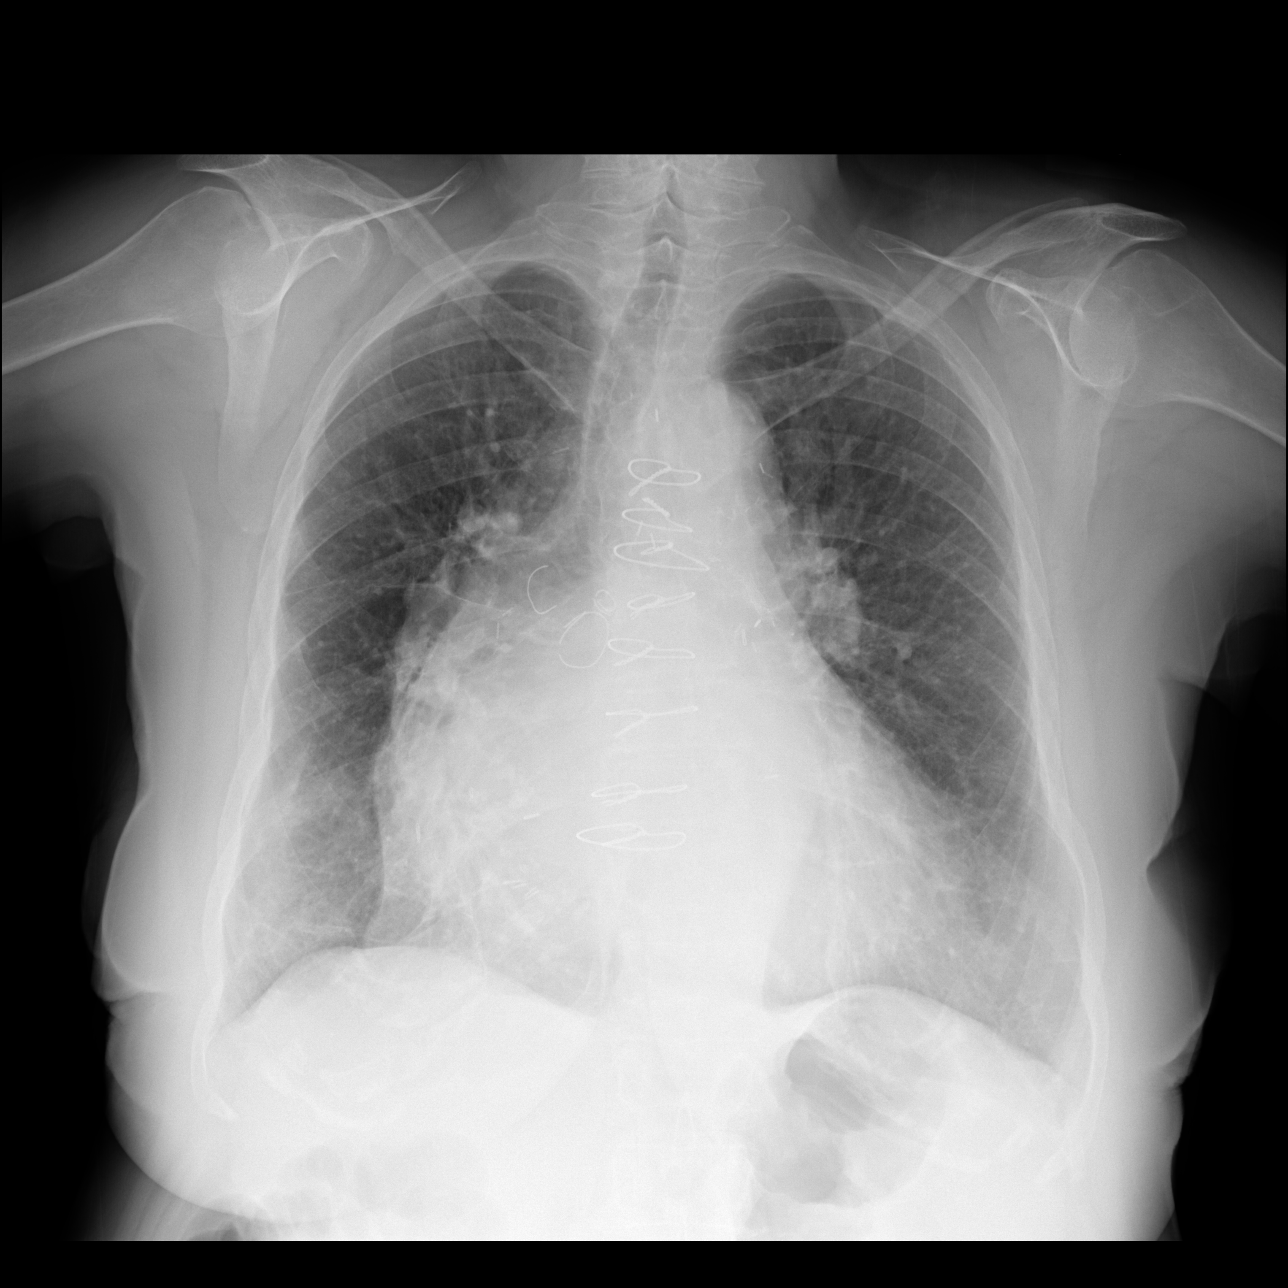
[im 2/2]
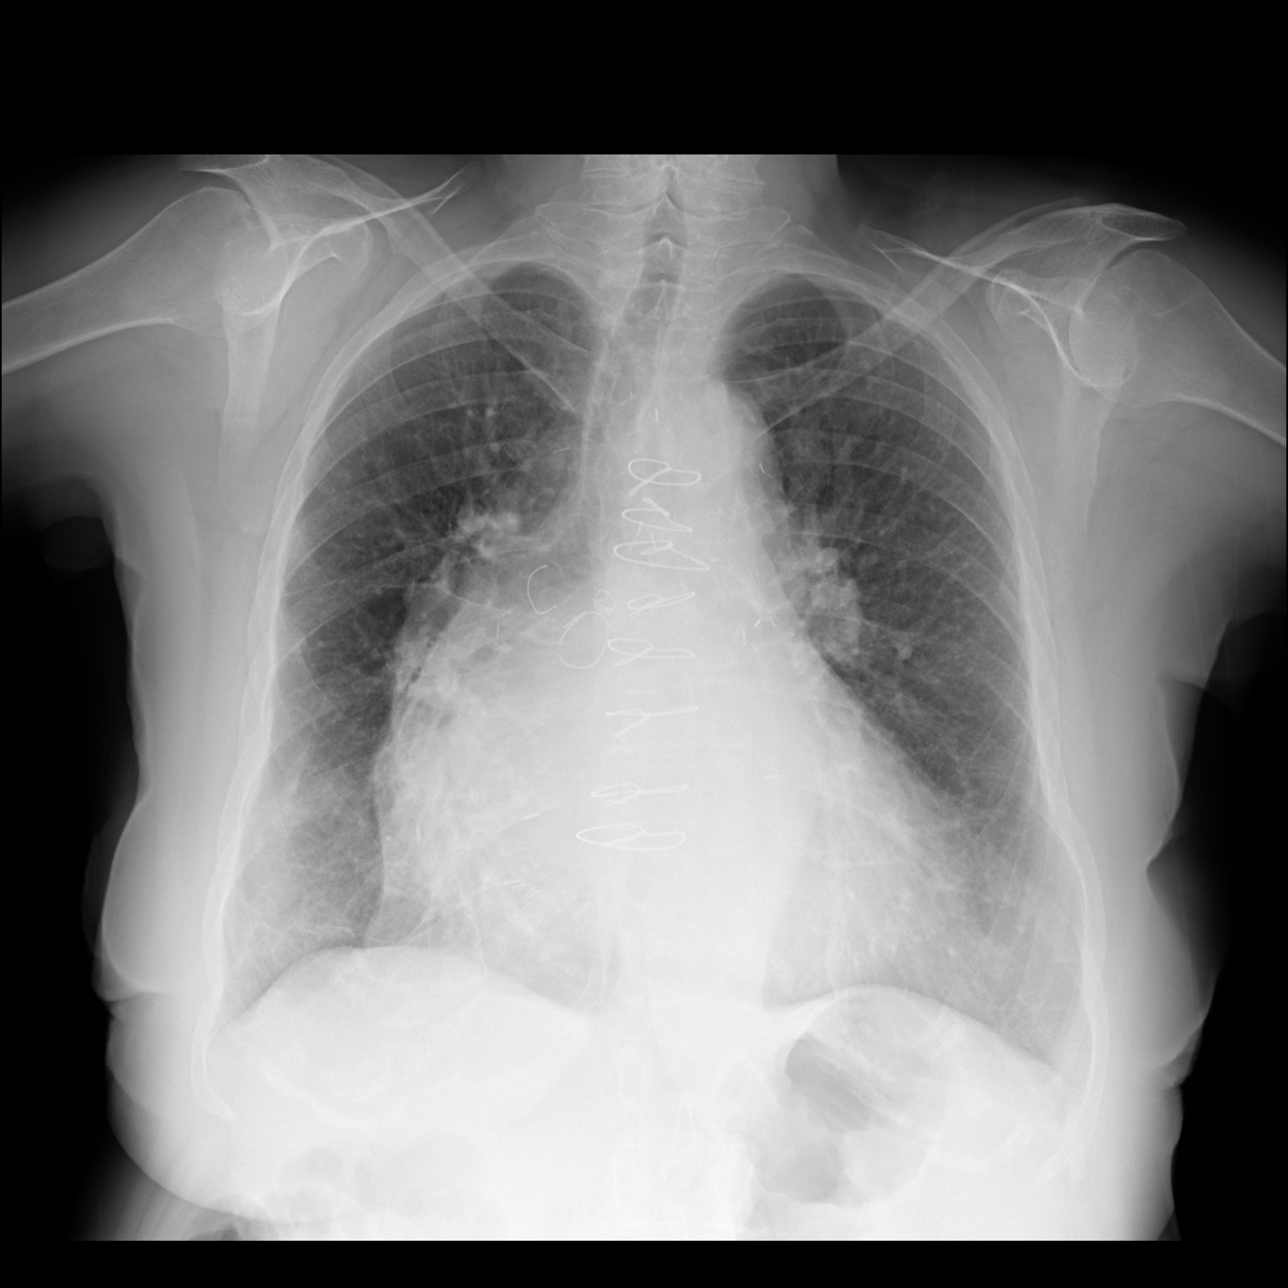

[dg chest 2 view (2 of 2)]
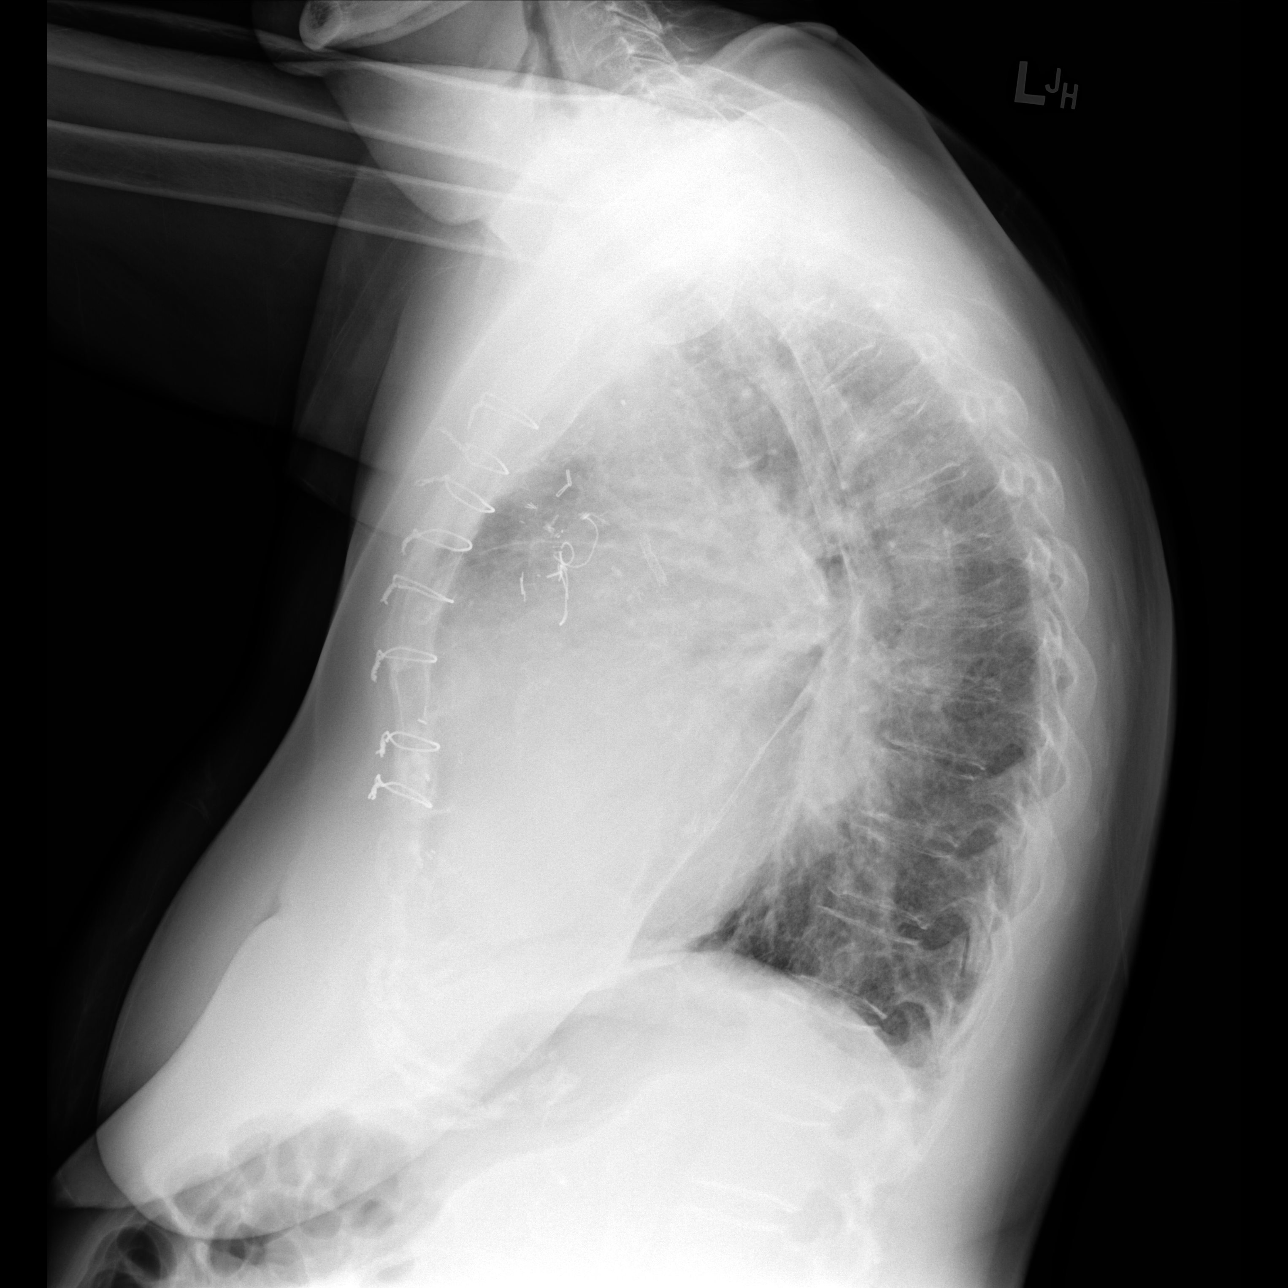

[3 of 3 positions shown; findings below may reference images not displayed]

FINDINGS: Prior CABG. Cardiomegaly. No confluent airspace opacities. No
effusions. No acute bony abnormality.
IMPRESSION: Cardiomegaly.  No acute cardiopulmonary disease.

## 2019-04-10 ENCOUNTER — Other Ambulatory Visit: Payer: Self-pay | Admitting: Family Medicine

## 2019-04-17 ENCOUNTER — Telehealth: Payer: Self-pay | Admitting: Family Medicine

## 2019-04-17 NOTE — Telephone Encounter (Signed)
Left VM I was just checking - if they need anything to call

## 2019-04-20 NOTE — Telephone Encounter (Signed)
Spoke w daughter Ms R is having more swelling and weight gain.  No increased shortness of breath.  Is visiting her son in [place]  Suggested add an additional lasix tablet to her regimen for a few days and to lets [place] know how she is doing

## 2019-04-21 ENCOUNTER — Other Ambulatory Visit: Payer: Self-pay | Admitting: Family Medicine

## 2019-04-22 ENCOUNTER — Ambulatory Visit (INDEPENDENT_AMBULATORY_CARE_PROVIDER_SITE_OTHER): Payer: Medicare Other | Admitting: Family Medicine

## 2019-04-22 ENCOUNTER — Encounter: Payer: Self-pay | Admitting: Family Medicine

## 2019-04-22 ENCOUNTER — Other Ambulatory Visit: Payer: Self-pay

## 2019-04-22 DIAGNOSIS — G2581 Restless legs syndrome: Secondary | ICD-10-CM

## 2019-04-22 DIAGNOSIS — I5043 Acute on chronic combined systolic (congestive) and diastolic (congestive) heart failure: Secondary | ICD-10-CM | POA: Diagnosis not present

## 2019-04-22 DIAGNOSIS — R251 Tremor, unspecified: Secondary | ICD-10-CM | POA: Insufficient documentation

## 2019-04-22 DIAGNOSIS — F411 Generalized anxiety disorder: Secondary | ICD-10-CM

## 2019-04-22 NOTE — Assessment & Plan Note (Signed)
Most bothersome with movement.  Did not see a resting tremor today.  May be unmasked intention tremor now that is off beta blocker.  Will monitor to see if becomes bothersome

## 2019-04-22 NOTE — Assessment & Plan Note (Signed)
Seems stable.  Does not seem to have significant excess fluid.  Monitor weight continue current medications

## 2019-04-22 NOTE — Assessment & Plan Note (Signed)
Stable using as needed xanax

## 2019-04-22 NOTE — Patient Instructions (Addendum)
Good to see you today!  Thanks for coming in.  Messsge me if you need medications or anything  Keep taking all medications as your are.    Weigh daily let me know if increasing  Come back in 1-2 months

## 2019-04-22 NOTE — Progress Notes (Signed)
    SUBJECTIVE:   CHIEF COMPLAINT / HPI:   HEART FAILURE Had increased edema while in [place].  Took 2 days of extra lasix.  No back to 2 tabs daily + metolazone.  Had some edema in feet.  No change in shortness of breath.  Weight usually 142 lb although has been in [place] for last week  LEG PAIN Has bilateral foot and leg pain.  Gabapentin might help some.  No lesions or redness  GAIT No recent falls.  Holds on to objects to move around. Feels is having more tremors lately   PERTINENT  PMH / PSH: on hospice  OBJECTIVE:   BP (!) 142/72   Pulse 66   Ht 5\' 5"  (1.651 m)   Wt 146 lb (66.2 kg)   SpO2 94%   BMI 24.30 kg/m   Alert happy Heart - loud systolic m regular rate Extrem - no pitting edema.  Feet look full but not definte edema Lungs - few basilar crackles other wise clear  Walked in from parking lot without O2.  Did have balance assist  Able to stand and walk but needs balance assist throughout   ASSESSMENT/PLAN:   Anxiety state Stable using as needed xanax  Acute on chronic combined systolic and diastolic heart failure (HCC) Seems stable.  Does not seem to have significant excess fluid.  Monitor weight continue current medications   Restless legs Continues to complain of severe leg pain.  Gabapentin seems to help but daughter wants to try to wean down because she feels this makes her dizzy.  Agree with slow wean and monitor for pain and leg foot changes   Tremor Most bothersome with movement.  Did not see a resting tremor today.  May be unmasked intention tremor now that is off beta blocker.  Will monitor to see if becomes bothersome    [name], MD [place]

## 2019-04-22 NOTE — Assessment & Plan Note (Signed)
Continues to complain of severe leg pain.  Gabapentin seems to help but daughter wants to try to wean down because she feels this makes her dizzy.  Agree with slow wean and monitor for pain and leg foot changes

## 2019-04-27 ENCOUNTER — Ambulatory Visit: Payer: Medicare Other | Admitting: Cardiology
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
## 2019-04-30 ENCOUNTER — Other Ambulatory Visit: Payer: Self-pay

## 2019-04-30 MED ORDER — METOLAZONE 5 MG PO TABS: 5.0000 mg | ORAL_TABLET | Freq: Every day | ORAL | 1 refills | Status: DC

## 2019-04-30 MED ORDER — FUROSEMIDE 40 MG PO TABS: ORAL_TABLET | ORAL | 0 refills | Status: DC

## 2019-05-06 ENCOUNTER — Other Ambulatory Visit: Payer: Self-pay

## 2019-05-06 MED ORDER — ISOSORB DINITRATE-HYDRALAZINE 20-37.5 MG PO TABS: 2.0000 | ORAL_TABLET | Freq: Three times a day (TID) | ORAL | 1 refills | Status: DC

## 2019-05-13 ENCOUNTER — Ambulatory Visit: Payer: Medicare Other | Admitting: Cardiology

## 2019-05-22 ENCOUNTER — Telehealth: Payer: Self-pay | Admitting: Family Medicine

## 2019-05-22 MED ORDER — PREDNISONE 10 MG PO TABS: ORAL_TABLET | ORAL | 1 refills | Status: DC

## 2019-05-22 NOTE — Telephone Encounter (Signed)
Spoke with daughter [name] last PM Having swollen red foot.  No fever Hospice believes may be gout Gabapentin and oxycodone not helping  Will give prednisone 40 mg daily And monitor

## 2019-05-25 ENCOUNTER — Other Ambulatory Visit: Payer: Self-pay | Admitting: Cardiology

## 2019-05-27 ENCOUNTER — Other Ambulatory Visit: Payer: Self-pay | Admitting: Family Medicine

## 2019-05-28 ENCOUNTER — Telehealth: Payer: Self-pay | Admitting: *Deleted

## 2019-05-28 NOTE — Telephone Encounter (Signed)
Nurse mikeala from hospice calling saying pt daughter stated that her foot is still hurting. Her son takes Indomethacin for his gout wanted to know if she could also get that long acting medication. Please call nurse back at [phone]. [place] Kennon Holter, CMA

## 2019-06-01 ENCOUNTER — Telehealth: Payer: Self-pay | Admitting: Family Medicine

## 2019-06-01 MED ORDER — INDOMETHACIN 50 MG PO CAPS: ORAL_CAPSULE | ORAL | 0 refills | Status: DC

## 2019-06-01 NOTE — Telephone Encounter (Signed)
Text from daughter Jodi [name] wanting to try Indomethacin for gout (her brother takes)  Suggested could try this.  Not to take w ibuprofen.   Has potential for stomach upset and renal damage   Make an appointment for labs and to look at foot

## 2019-06-02 ENCOUNTER — Other Ambulatory Visit: Payer: Self-pay | Admitting: Family Medicine

## 2019-06-02 NOTE — Telephone Encounter (Signed)
Hospice nurse, Ria Comment, calls nurse line stating the patient is taking 2 Senexon in the AM and 2 PM, therefore they have run out. Also, Ria Comment stated the patient is not sleeping. The patients current night time medications are Xanax .25mg  and Oxycodone 5mg . Patient is up all through the night and her daughter is not getting any sleep. Ria Comment stated the patient told her the Oxycodone does not really help her foot pain. Ria Comment is suggesting to take her off the pain medication and up her Xanax. Please advise.   Ria Comment: [phone]

## 2019-06-10 ENCOUNTER — Telehealth: Payer: Self-pay

## 2019-06-10 NOTE — Telephone Encounter (Signed)
Jodi [name], home health hospice, LVM on nurse line stating the patient reported Indomethacin worked very well. The medication was very effective and helped with the pain and swelling. Jodi [name] is wanting to know if there is something she can take daily to prevent a gout flare again. Jodi [name] suggesting Allopurinol daily? Will forward to PCP.

## 2019-06-11 NOTE — Telephone Encounter (Signed)
Discussed previously with daughter that in order to start possible gout medicine would need to come in for blood tests to check uric acid and renal function before could safely start medications  Daughter had planned to make an appointment

## 2019-06-15 ENCOUNTER — Telehealth: Payer: Self-pay

## 2019-06-15 NOTE — Telephone Encounter (Signed)
Pt's daughter called and said that her mother has been very dizzy and very tired. She wonders if the bidil is to much. She takes 1 tablet TID.

## 2019-06-15 NOTE — Telephone Encounter (Signed)
Reduce to 1 tab BID

## 2019-06-16 ENCOUNTER — Other Ambulatory Visit: Payer: Self-pay | Admitting: Family Medicine

## 2019-06-16 MED ORDER — INDOMETHACIN 50 MG PO CAPS: ORAL_CAPSULE | ORAL | 1 refills | Status: DC

## 2019-06-18 ENCOUNTER — Telehealth: Payer: Self-pay

## 2019-06-18 NOTE — Telephone Encounter (Signed)
Ria Comment from [facility] calls nurse line regarding patient's medications. Ria Comment reports that patient has been taking xanax and oxycodone. Patient's daughter has increased xanax 0.5 mg nightly to help patient with sleep. Hospice is requesting additional sleep aide. States that they typically use Restoril.   Patient's daughter states that they do not want patient to be taken off of xanax.   Please advise if restoril would be an appropriate addition. Patient also requesting refills on oxycodone and xanax.   Ria Comment can be reached at [phone] with additional questions.   To PCP  Talbot Grumbling, RN

## 2019-06-19 ENCOUNTER — Other Ambulatory Visit: Payer: Self-pay | Admitting: Family Medicine

## 2019-06-19 MED ORDER — OXYCODONE HCL 5 MG PO TABS: 5.0000 mg | ORAL_TABLET | ORAL | 0 refills | Status: DC | PRN

## 2019-06-19 MED ORDER — OXYCODONE HCL 5 MG PO CAPS: 5.0000 mg | ORAL_CAPSULE | ORAL | 0 refills | Status: DC | PRN

## 2019-06-19 MED ORDER — ALPRAZOLAM 0.25 MG PO TABS: 0.2500 mg | ORAL_TABLET | Freq: Three times a day (TID) | ORAL | 1 refills | Status: DC | PRN

## 2019-06-19 NOTE — Telephone Encounter (Signed)
Spoke with pt daughter angel and informed her. She said pt is very depressed and need something for sleep because she isn't sleeping. [place] Kennon Holter, CMA

## 2019-06-19 NOTE — Telephone Encounter (Signed)
Please let Ria Comment know I am out of town  I refilled the xanax and ocycodone  I would not add restoril right now until I discuss with patient and family.  Usuallly not good to take xanax and restoril together  If urgent she can call me [phone] my cell  Thanks  LC

## 2019-06-23 ENCOUNTER — Other Ambulatory Visit: Payer: Self-pay | Admitting: Cardiology

## 2019-06-27 ENCOUNTER — Telehealth: Payer: Self-pay | Admitting: Family Medicine

## 2019-06-27 MED ORDER — TEMAZEPAM 15 MG PO CAPS: 15.0000 mg | ORAL_CAPSULE | Freq: Every evening | ORAL | 0 refills | Status: DC | PRN

## 2019-06-27 NOTE — Telephone Encounter (Signed)
Spoke with daughter [name].    Having problems sleeping.  Decided to try restoril (her choice) 15 mg at night and not to take w xanax   [name] will let me know how she is doing

## 2019-07-14 ENCOUNTER — Other Ambulatory Visit: Payer: Self-pay | Admitting: Family Medicine

## 2019-07-14 ENCOUNTER — Other Ambulatory Visit: Payer: Self-pay | Admitting: Cardiology

## 2019-07-15 ENCOUNTER — Other Ambulatory Visit: Payer: Self-pay | Admitting: Cardiology

## 2019-07-15 ENCOUNTER — Ambulatory Visit (INDEPENDENT_AMBULATORY_CARE_PROVIDER_SITE_OTHER): Admitting: Family Medicine

## 2019-07-15 ENCOUNTER — Other Ambulatory Visit: Payer: Self-pay

## 2019-07-15 DIAGNOSIS — M79671 Pain in right foot: Secondary | ICD-10-CM | POA: Diagnosis not present

## 2019-07-15 DIAGNOSIS — F5101 Primary insomnia: Secondary | ICD-10-CM

## 2019-07-15 DIAGNOSIS — M79672 Pain in left foot: Secondary | ICD-10-CM

## 2019-07-15 DIAGNOSIS — I10 Essential (primary) hypertension: Secondary | ICD-10-CM

## 2019-07-15 DIAGNOSIS — R5383 Other fatigue: Secondary | ICD-10-CM

## 2019-07-15 MED ORDER — FUROSEMIDE 40 MG PO TABS: ORAL_TABLET | ORAL | 3 refills | Status: DC

## 2019-07-15 MED ORDER — ALPRAZOLAM 0.25 MG PO TABS: 0.5000 mg | ORAL_TABLET | Freq: Every evening | ORAL | 1 refills | Status: DC | PRN

## 2019-07-15 MED ORDER — OXYCODONE HCL 5 MG PO TABS: 5.0000 mg | ORAL_TABLET | ORAL | 0 refills | Status: AC | PRN

## 2019-07-15 NOTE — Progress Notes (Signed)
    SUBJECTIVE:   CHIEF COMPLAINT / HPI:   FATIGUE Very fatigued with exertion.  Was able to walk in slowly from parking lot to exam room without O2 but is tired.  No specific severe shortness of breath or chest pain No fevers Weight is stable  EDEMA Minimal edema but does occur if does not use lasix or if uses indomethacin for more than a few days  FOOT PAIN Fairly well controlled with indomethacin as needed and oxycodone when severe  ANXIETY/SLEEP Needs to take 2 xanax and one restoril to get to sleep.  Other wise is miserable  PERTINENT  PMH / PSH: going to [place] for a while in a week  OBJECTIVE:   BP 137/60   Pulse 60   Ht 5\' 2"  (1.575 m)   Wt 146 lb 12.8 oz (66.6 kg)   SpO2 91%   BMI 26.85 kg/m   Alert fatigued appearing L - crackles at bases R>L no wheeze H - loud systolic m  Extr - no edema at ankles, Feet - no redness or soft tissue swelling Able to stand and walk about 25 feet around the room alone but then needs to sit quickly   ASSESSMENT/PLAN:   Insomnia Severe.  Controlled with multiple sedatives.  They understand risks of sedation and falls but feel it is worth it.  Will continue   Fatigue Many possible causes.  Will check labs looking for anemia and electrolyte causes given her medications   Foot pain Unsure of cause.  Not strictly consistent with gout but seems to respond to as needed indomethacin which may be treating arthritis.  Will continue and check labs for anemia and renal problems      [name], MD [place]

## 2019-07-15 NOTE — Assessment & Plan Note (Signed)
Unsure of cause.  Not strictly consistent with gout but seems to respond to as needed indomethacin which may be treating arthritis.  Will continue and check labs for anemia and renal problems

## 2019-07-15 NOTE — Patient Instructions (Addendum)
Good to see you today!  Thanks for coming in.  Try selsun blue first if not helping with the itchy scalp then let me know  I will call you if your tests are not good.  Otherwise I will send you a letter.  If you do not hear from me with in 2 weeks please call our office.     Have a great trip to [place].

## 2019-07-15 NOTE — Assessment & Plan Note (Signed)
Severe.  Controlled with multiple sedatives.  They understand risks of sedation and falls but feel it is worth it.  Will continue

## 2019-07-15 NOTE — Assessment & Plan Note (Signed)
Many possible causes.  Will check labs looking for anemia and electrolyte causes given her medications

## 2019-07-16 ENCOUNTER — Telehealth: Payer: Self-pay | Admitting: Family Medicine

## 2019-07-16 LAB — CBC
Hematocrit: 30.7 % — ABNORMAL LOW (ref 34.0–46.6)
Hemoglobin: 9.7 g/dL — ABNORMAL LOW (ref 11.1–15.9)
MCH: 27.3 pg (ref 26.6–33.0)
MCHC: 31.6 g/dL (ref 31.5–35.7)
MCV: 87 fL (ref 79–97)
Platelets: 210 10*3/uL (ref 150–450)
RBC: 3.55 x10E6/uL — ABNORMAL LOW (ref 3.77–5.28)
RDW: 15.8 % — ABNORMAL HIGH (ref 11.7–15.4)
WBC: 7.2 10*3/uL (ref 3.4–10.8)

## 2019-07-16 LAB — CMP14+EGFR
ALT: 8 IU/L (ref 0–32)
AST: 18 IU/L (ref 0–40)
Albumin/Globulin Ratio: 1.7 (ref 1.2–2.2)
Albumin: 4.5 g/dL (ref 3.6–4.6)
Alkaline Phosphatase: 78 IU/L (ref 48–121)
BUN/Creatinine Ratio: 43 — ABNORMAL HIGH (ref 12–28)
BUN: 97 mg/dL (ref 8–27)
Bilirubin Total: 0.8 mg/dL (ref 0.0–1.2)
CO2: 31 mmol/L — ABNORMAL HIGH (ref 20–29)
Calcium: 10 mg/dL (ref 8.7–10.3)
Chloride: 86 mmol/L — ABNORMAL LOW (ref 96–106)
Creatinine, Ser: 2.28 mg/dL — ABNORMAL HIGH (ref 0.57–1.00)
GFR calc Af Amer: 22 mL/min/{1.73_m2} — ABNORMAL LOW (ref >59)
GFR calc non Af Amer: 19 mL/min/{1.73_m2} — ABNORMAL LOW (ref >59)
Globulin, Total: 2.6 g/dL (ref 1.5–4.5)
Glucose: 183 mg/dL — ABNORMAL HIGH (ref 65–99)
Potassium: 3.1 mmol/L — ABNORMAL LOW (ref 3.5–5.2)
Sodium: 137 mmol/L (ref 134–144)
Total Protein: 7.1 g/dL (ref 6.0–8.5)

## 2019-07-16 MED ORDER — POTASSIUM CHLORIDE ER 20 MEQ PO TBCR: 20.0000 meq | EXTENDED_RELEASE_TABLET | Freq: Two times a day (BID) | ORAL | 1 refills | Status: DC

## 2019-07-16 NOTE — Addendum Note (Signed)
Addended by: Talbert Cage L on: 07/16/2019 01:55 PM   Modules accepted: Orders

## 2019-07-16 NOTE — Telephone Encounter (Signed)
Spoke with daughter.  Recommend taking K tabs one twice a day for 5 days then one for every lasix tablet she takes  Told her we are guessing and that normally would measure more closely. She was ok doing it this way given her mom will be out of town

## 2019-07-25 ENCOUNTER — Encounter: Payer: Self-pay | Admitting: Family Medicine

## 2019-07-25 NOTE — Progress Notes (Signed)
Patient's daughter called from [place]. She is having UTI symptoms of frequency and small amount of bleeding. No fever or vomiting. I called in Keflex 250 mg one POTID number [id number]

## 2019-07-30 ENCOUNTER — Other Ambulatory Visit: Payer: Self-pay | Admitting: Family Medicine

## 2019-08-02 DIAGNOSIS — R310 Gross hematuria: Secondary | ICD-10-CM | POA: Diagnosis not present

## 2019-08-02 DIAGNOSIS — R300 Dysuria: Secondary | ICD-10-CM | POA: Diagnosis not present

## 2019-08-02 DIAGNOSIS — N3001 Acute cystitis with hematuria: Secondary | ICD-10-CM | POA: Diagnosis not present

## 2019-08-05 ENCOUNTER — Telehealth: Payer: Self-pay | Admitting: Family Medicine

## 2019-08-05 NOTE — Telephone Encounter (Signed)
Spoke with Dr. Erin Hearing and was given ok to call in verbal orders for re certification.   Ria Comment informed.  Christen Bame, CMA

## 2019-08-05 NOTE — Telephone Encounter (Signed)
Mendel Ryder from [facility] is calling to get re-certifcation for Hospice Care. Thanks

## 2019-08-07 ENCOUNTER — Other Ambulatory Visit: Payer: Self-pay | Admitting: Family Medicine

## 2019-08-29 ENCOUNTER — Other Ambulatory Visit: Payer: Self-pay | Admitting: Family Medicine

## 2019-09-02 ENCOUNTER — Telehealth: Payer: Self-pay | Admitting: Family Medicine

## 2019-09-02 MED ORDER — POTASSIUM CHLORIDE ER 20 MEQ PO TBCR: 20.0000 meq | EXTENDED_RELEASE_TABLET | Freq: Two times a day (BID) | ORAL | 1 refills | Status: DC

## 2019-09-02 MED ORDER — FUROSEMIDE 40 MG PO TABS: ORAL_TABLET | ORAL | 3 refills | Status: AC

## 2019-09-02 NOTE — Telephone Encounter (Signed)
Spoke w daughter.  Having more leg swelling and shortness of breath.  Gave her an extra (total of 3) lasix 40 mg this am.  Gets metolazone once a day.  Has run out of K  Suggest continue 120 mg lasix daily for next 2 days with metolazone 5 mg and K two tabs and let me know how doing  Hospice sees her tomorrow

## 2019-09-19 ENCOUNTER — Inpatient Hospital Stay (HOSPITAL_COMMUNITY)
Admission: EM | Admit: 2019-09-19 | Discharge: 2019-09-21 | DRG: 552 | Disposition: A | Discharge status: Home / Self Care | Attending: Family Medicine | Admitting: Family Medicine

## 2019-09-19 ENCOUNTER — Emergency Department (HOSPITAL_COMMUNITY)

## 2019-09-19 ENCOUNTER — Other Ambulatory Visit: Payer: Self-pay

## 2019-09-19 ENCOUNTER — Inpatient Hospital Stay (HOSPITAL_COMMUNITY)

## 2019-09-19 DIAGNOSIS — W1830XA Fall on same level, unspecified, initial encounter: Secondary | ICD-10-CM | POA: Diagnosis present

## 2019-09-19 DIAGNOSIS — E785 Hyperlipidemia, unspecified: Secondary | ICD-10-CM | POA: Diagnosis present

## 2019-09-19 DIAGNOSIS — R6889 Other general symptoms and signs: Secondary | ICD-10-CM | POA: Diagnosis not present

## 2019-09-19 DIAGNOSIS — I080 Rheumatic disorders of both mitral and aortic valves: Secondary | ICD-10-CM | POA: Diagnosis present

## 2019-09-19 DIAGNOSIS — N179 Acute kidney failure, unspecified: Secondary | ICD-10-CM

## 2019-09-19 DIAGNOSIS — Z66 Do not resuscitate: Secondary | ICD-10-CM | POA: Diagnosis present

## 2019-09-19 DIAGNOSIS — E212 Other hyperparathyroidism: Secondary | ICD-10-CM | POA: Diagnosis present

## 2019-09-19 DIAGNOSIS — I480 Paroxysmal atrial fibrillation: Secondary | ICD-10-CM | POA: Diagnosis present

## 2019-09-19 DIAGNOSIS — S4991XA Unspecified injury of right shoulder and upper arm, initial encounter: Secondary | ICD-10-CM | POA: Diagnosis not present

## 2019-09-19 DIAGNOSIS — Z888 Allergy status to other drugs, medicaments and biological substances status: Secondary | ICD-10-CM | POA: Diagnosis not present

## 2019-09-19 DIAGNOSIS — I517 Cardiomegaly: Secondary | ICD-10-CM | POA: Diagnosis not present

## 2019-09-19 DIAGNOSIS — I739 Peripheral vascular disease, unspecified: Secondary | ICD-10-CM | POA: Diagnosis not present

## 2019-09-19 DIAGNOSIS — Z20822 Contact with and (suspected) exposure to covid-19: Secondary | ICD-10-CM | POA: Diagnosis present

## 2019-09-19 DIAGNOSIS — E860 Dehydration: Secondary | ICD-10-CM | POA: Diagnosis present

## 2019-09-19 DIAGNOSIS — M25551 Pain in right hip: Secondary | ICD-10-CM | POA: Diagnosis not present

## 2019-09-19 DIAGNOSIS — N2581 Secondary hyperparathyroidism of renal origin: Secondary | ICD-10-CM | POA: Diagnosis present

## 2019-09-19 DIAGNOSIS — Z951 Presence of aortocoronary bypass graft: Secondary | ICD-10-CM | POA: Diagnosis not present

## 2019-09-19 DIAGNOSIS — S79911A Unspecified injury of right hip, initial encounter: Secondary | ICD-10-CM | POA: Diagnosis not present

## 2019-09-19 DIAGNOSIS — F411 Generalized anxiety disorder: Secondary | ICD-10-CM | POA: Diagnosis present

## 2019-09-19 DIAGNOSIS — I130 Hypertensive heart and chronic kidney disease with heart failure and stage 1 through stage 4 chronic kidney disease, or unspecified chronic kidney disease: Secondary | ICD-10-CM | POA: Diagnosis present

## 2019-09-19 DIAGNOSIS — R52 Pain, unspecified: Secondary | ICD-10-CM

## 2019-09-19 DIAGNOSIS — M25552 Pain in left hip: Secondary | ICD-10-CM | POA: Diagnosis not present

## 2019-09-19 DIAGNOSIS — W19XXXA Unspecified fall, initial encounter: Secondary | ICD-10-CM | POA: Diagnosis not present

## 2019-09-19 DIAGNOSIS — I5042 Chronic combined systolic (congestive) and diastolic (congestive) heart failure: Secondary | ICD-10-CM | POA: Diagnosis present

## 2019-09-19 DIAGNOSIS — H3530 Unspecified macular degeneration: Secondary | ICD-10-CM | POA: Diagnosis present

## 2019-09-19 DIAGNOSIS — Z79891 Long term (current) use of opiate analgesic: Secondary | ICD-10-CM

## 2019-09-19 DIAGNOSIS — Z882 Allergy status to sulfonamides status: Secondary | ICD-10-CM | POA: Diagnosis not present

## 2019-09-19 DIAGNOSIS — I2510 Atherosclerotic heart disease of native coronary artery without angina pectoris: Secondary | ICD-10-CM | POA: Diagnosis present

## 2019-09-19 DIAGNOSIS — R0902 Hypoxemia: Secondary | ICD-10-CM | POA: Diagnosis not present

## 2019-09-19 DIAGNOSIS — S3210XA Unspecified fracture of sacrum, initial encounter for closed fracture: Principal | ICD-10-CM | POA: Diagnosis present

## 2019-09-19 DIAGNOSIS — Z806 Family history of leukemia: Secondary | ICD-10-CM

## 2019-09-19 DIAGNOSIS — Z85528 Personal history of other malignant neoplasm of kidney: Secondary | ICD-10-CM | POA: Diagnosis not present

## 2019-09-19 DIAGNOSIS — I272 Pulmonary hypertension, unspecified: Secondary | ICD-10-CM | POA: Diagnosis present

## 2019-09-19 DIAGNOSIS — Z823 Family history of stroke: Secondary | ICD-10-CM

## 2019-09-19 DIAGNOSIS — M533 Sacrococcygeal disorders, not elsewhere classified: Secondary | ICD-10-CM | POA: Diagnosis not present

## 2019-09-19 DIAGNOSIS — I482 Chronic atrial fibrillation, unspecified: Secondary | ICD-10-CM | POA: Diagnosis present

## 2019-09-19 DIAGNOSIS — M25511 Pain in right shoulder: Secondary | ICD-10-CM | POA: Diagnosis not present

## 2019-09-19 DIAGNOSIS — M8580 Other specified disorders of bone density and structure, unspecified site: Secondary | ICD-10-CM | POA: Diagnosis present

## 2019-09-19 DIAGNOSIS — M2550 Pain in unspecified joint: Secondary | ICD-10-CM | POA: Diagnosis not present

## 2019-09-19 DIAGNOSIS — S7001XA Contusion of right hip, initial encounter: Secondary | ICD-10-CM | POA: Diagnosis present

## 2019-09-19 DIAGNOSIS — Z79899 Other long term (current) drug therapy: Secondary | ICD-10-CM

## 2019-09-19 DIAGNOSIS — I4891 Unspecified atrial fibrillation: Secondary | ICD-10-CM | POA: Diagnosis not present

## 2019-09-19 DIAGNOSIS — Z7401 Bed confinement status: Secondary | ICD-10-CM | POA: Diagnosis not present

## 2019-09-19 DIAGNOSIS — N281 Cyst of kidney, acquired: Secondary | ICD-10-CM | POA: Diagnosis not present

## 2019-09-19 DIAGNOSIS — G8929 Other chronic pain: Secondary | ICD-10-CM | POA: Diagnosis present

## 2019-09-19 DIAGNOSIS — S300XXA Contusion of lower back and pelvis, initial encounter: Secondary | ICD-10-CM | POA: Diagnosis not present

## 2019-09-19 DIAGNOSIS — N184 Chronic kidney disease, stage 4 (severe): Secondary | ICD-10-CM | POA: Diagnosis present

## 2019-09-19 DIAGNOSIS — I252 Old myocardial infarction: Secondary | ICD-10-CM | POA: Diagnosis not present

## 2019-09-19 DIAGNOSIS — R5383 Other fatigue: Secondary | ICD-10-CM | POA: Diagnosis not present

## 2019-09-19 DIAGNOSIS — Z8673 Personal history of transient ischemic attack (TIA), and cerebral infarction without residual deficits: Secondary | ICD-10-CM

## 2019-09-19 DIAGNOSIS — J90 Pleural effusion, not elsewhere classified: Secondary | ICD-10-CM | POA: Diagnosis not present

## 2019-09-19 DIAGNOSIS — R296 Repeated falls: Secondary | ICD-10-CM | POA: Diagnosis present

## 2019-09-19 DIAGNOSIS — S99921A Unspecified injury of right foot, initial encounter: Secondary | ICD-10-CM | POA: Diagnosis not present

## 2019-09-19 DIAGNOSIS — M79671 Pain in right foot: Secondary | ICD-10-CM | POA: Diagnosis not present

## 2019-09-19 DIAGNOSIS — M25572 Pain in left ankle and joints of left foot: Secondary | ICD-10-CM | POA: Diagnosis not present

## 2019-09-19 DIAGNOSIS — M1611 Unilateral primary osteoarthritis, right hip: Secondary | ICD-10-CM | POA: Diagnosis not present

## 2019-09-19 DIAGNOSIS — Z8249 Family history of ischemic heart disease and other diseases of the circulatory system: Secondary | ICD-10-CM

## 2019-09-19 DIAGNOSIS — I499 Cardiac arrhythmia, unspecified: Secondary | ICD-10-CM | POA: Diagnosis not present

## 2019-09-19 DIAGNOSIS — Z743 Need for continuous supervision: Secondary | ICD-10-CM | POA: Diagnosis not present

## 2019-09-19 LAB — COMPREHENSIVE METABOLIC PANEL
ALT: 10 U/L (ref 0–44)
AST: 18 U/L (ref 15–41)
Albumin: 3.8 g/dL (ref 3.5–5.0)
Alkaline Phosphatase: 65 U/L (ref 38–126)
Anion gap: 13 (ref 5–15)
BUN: 92 mg/dL — ABNORMAL HIGH (ref 8–23)
CO2: 32 mmol/L (ref 22–32)
Calcium: 10 mg/dL (ref 8.9–10.3)
Chloride: 94 mmol/L — ABNORMAL LOW (ref 98–111)
Creatinine, Ser: 3.56 mg/dL — ABNORMAL HIGH (ref 0.44–1.00)
GFR calc Af Amer: 13 mL/min — ABNORMAL LOW (ref >60)
GFR calc non Af Amer: 11 mL/min — ABNORMAL LOW (ref >60)
Glucose, Bld: 131 mg/dL — ABNORMAL HIGH (ref 70–99)
Potassium: 5.1 mmol/L (ref 3.5–5.1)
Sodium: 139 mmol/L (ref 135–145)
Total Bilirubin: 1.1 mg/dL (ref 0.3–1.2)
Total Protein: 6.9 g/dL (ref 6.5–8.1)

## 2019-09-19 LAB — CBC WITH DIFFERENTIAL/PLATELET
Abs Immature Granulocytes: 0.02 10*3/uL (ref 0.00–0.07)
Basophils Absolute: 0.1 10*3/uL (ref 0.0–0.1)
Basophils Relative: 1 %
Eosinophils Absolute: 0.5 10*3/uL (ref 0.0–0.5)
Eosinophils Relative: 6 %
HCT: 30.4 % — ABNORMAL LOW (ref 36.0–46.0)
Hemoglobin: 8.5 g/dL — ABNORMAL LOW (ref 12.0–15.0)
Immature Granulocytes: 0 %
Lymphocytes Relative: 24 %
Lymphs Abs: 1.7 10*3/uL (ref 0.7–4.0)
MCH: 23.7 pg — ABNORMAL LOW (ref 26.0–34.0)
MCHC: 28 g/dL — ABNORMAL LOW (ref 30.0–36.0)
MCV: 84.9 fL (ref 80.0–100.0)
Monocytes Absolute: 1 10*3/uL (ref 0.1–1.0)
Monocytes Relative: 14 %
Neutro Abs: 3.9 10*3/uL (ref 1.7–7.7)
Neutrophils Relative %: 55 %
Platelets: 163 10*3/uL (ref 150–400)
RBC: 3.58 MIL/uL — ABNORMAL LOW (ref 3.87–5.11)
RDW: 18.2 % — ABNORMAL HIGH (ref 11.5–15.5)
WBC: 7.3 10*3/uL (ref 4.0–10.5)
nRBC: 0 % (ref 0.0–0.2)

## 2019-09-19 LAB — SARS CORONAVIRUS 2 BY RT PCR (HOSPITAL ORDER, PERFORMED IN ~~LOC~~ HOSPITAL LAB): SARS Coronavirus 2: NEGATIVE

## 2019-09-19 LAB — VITAMIN D 25 HYDROXY (VIT D DEFICIENCY, FRACTURES): Vit D, 25-Hydroxy: 31.04 ng/mL (ref 30–100)

## 2019-09-19 LAB — PHOSPHORUS: Phosphorus: 6.2 mg/dL — ABNORMAL HIGH (ref 2.5–4.6)

## 2019-09-19 MED ORDER — ISOSORB DINITRATE-HYDRALAZINE 20-37.5 MG PO TABS
1.0000 | ORAL_TABLET | Freq: Three times a day (TID) | ORAL | Status: DC
  Administered 2019-09-19 – 2019-09-21 (×6): 1 via ORAL
  Filled 2019-09-19 (×8): qty 1

## 2019-09-19 MED ORDER — GABAPENTIN 100 MG PO CAPS
200.0000 mg | ORAL_CAPSULE | Freq: Every day | ORAL | Status: DC
  Administered 2019-09-19 – 2019-09-20 (×2): 200 mg via ORAL
  Filled 2019-09-19 (×2): qty 2

## 2019-09-19 MED ORDER — HYDRALAZINE HCL 25 MG PO TABS: 25.0000 mg | ORAL_TABLET | Freq: Four times a day (QID) | ORAL | Status: DC | PRN

## 2019-09-19 MED ORDER — ACETAMINOPHEN 500 MG PO TABS: 500.0000 mg | ORAL_TABLET | Freq: Four times a day (QID) | ORAL | Status: DC | PRN

## 2019-09-19 MED ORDER — MAGNESIUM HYDROXIDE 400 MG/5ML PO SUSP
30.0000 mL | Freq: Every day | ORAL | Status: DC | PRN
  Administered 2019-09-20: 30 mL via ORAL
  Filled 2019-09-19 (×2): qty 30

## 2019-09-19 MED ORDER — SENNOSIDES-DOCUSATE SODIUM 8.6-50 MG PO TABS
2.0000 | ORAL_TABLET | Freq: Two times a day (BID) | ORAL | Status: DC
  Administered 2019-09-19 – 2019-09-21 (×5): 2 via ORAL
  Filled 2019-09-19 (×5): qty 2

## 2019-09-19 MED ORDER — FENTANYL CITRATE (PF) 100 MCG/2ML IJ SOLN
50.0000 ug | INTRAMUSCULAR | Status: DC | PRN
  Administered 2019-09-19: 50 ug via INTRAVENOUS
  Filled 2019-09-19: qty 2

## 2019-09-19 MED ORDER — HYDROMORPHONE HCL 1 MG/ML IJ SOLN: 0.5000 mg | INTRAMUSCULAR | Status: DC | PRN

## 2019-09-19 MED ORDER — ALBUTEROL SULFATE (2.5 MG/3ML) 0.083% IN NEBU: 2.5000 mg | INHALATION_SOLUTION | Freq: Four times a day (QID) | RESPIRATORY_TRACT | Status: DC | PRN

## 2019-09-19 MED ORDER — HEPARIN SODIUM (PORCINE) 5000 UNIT/ML IJ SOLN
5000.0000 [IU] | Freq: Two times a day (BID) | INTRAMUSCULAR | Status: DC
  Administered 2019-09-19 – 2019-09-21 (×5): 5000 [IU] via SUBCUTANEOUS
  Filled 2019-09-19 (×5): qty 1

## 2019-09-19 MED ORDER — DOCUSATE SODIUM 100 MG PO CAPS
100.0000 mg | ORAL_CAPSULE | Freq: Two times a day (BID) | ORAL | Status: DC
  Administered 2019-09-19 – 2019-09-21 (×5): 100 mg via ORAL
  Filled 2019-09-19 (×5): qty 1

## 2019-09-19 MED ORDER — ASCORBIC ACID 500 MG PO TABS
250.0000 mg | ORAL_TABLET | Freq: Every day | ORAL | Status: DC
  Administered 2019-09-19 – 2019-09-21 (×3): 250 mg via ORAL
  Filled 2019-09-19 (×3): qty 1

## 2019-09-19 MED ORDER — ALPRAZOLAM 0.5 MG PO TABS
0.5000 mg | ORAL_TABLET | Freq: Every day | ORAL | Status: DC
  Administered 2019-09-19 – 2019-09-20 (×2): 0.5 mg via ORAL
  Filled 2019-09-19 (×2): qty 1

## 2019-09-19 MED ORDER — VITAMIN D (ERGOCALCIFEROL) 1.25 MG (50000 UNIT) PO CAPS
50000.0000 [IU] | ORAL_CAPSULE | ORAL | Status: DC
  Filled 2019-09-19: qty 1

## 2019-09-19 MED ORDER — OXYCODONE HCL 5 MG PO TABS
5.0000 mg | ORAL_TABLET | ORAL | Status: DC | PRN
  Administered 2019-09-20 – 2019-09-21 (×4): 5 mg via ORAL
  Filled 2019-09-19 (×4): qty 1

## 2019-09-19 NOTE — ED Notes (Signed)
Pt returned from CT °

## 2019-09-19 NOTE — Progress Notes (Signed)
[place] [facility]) Hospitalized Hospice Patient  Ms. Hammock is a current hospice patient with a terminal diagnosis of CHF per Dr. Tomasa Hosteller with [facility].  She fell two days ago and has had significant pain since.  Family wanted to try to manage her care at home, daughter is a PT and pt resides with her, but the pain was too bad.  She is admitted with a right hip fracture. This is a related hospital admission.   Prior to fall, she was relatively independent at her daughter's home.  After the fall, pt can no longer transfer or ambulate unassisted and her family has not been able to provide the help she needs.  They would like to ultimately take her back home but need to see first if her pain can be controlled to see if she can assist with transfers etc. They are open to SNF, but are hopeful that PT/OT can recommend how to safely accomplish transfers so she can remain at home.  Of note, her daughter [name] and caregiver also cares for her own daughter that has CP and is unable to walk. [name] is not physically able to care for both her own daughter and her mother in her mother's current state.  V/S: 159/108, HR 67, RR 16, SPO2 100% RA I&O: none recorded Labs: BUN 92, Cr 3.56, gfr 11, RBC 3.58, hgb 8.5, HCT 30.1 Diagnostics: CT hip WO - Acute nondisplaced fracture of the inferior aspect of the right sacral ala adjacent to the SI joint IVs/PRNs:  Fentanyl 50 mcg IV x1  D/C plan: ongoing, from home with her daughter, family would like her to return home if it is safely feasible to do so GOC: clear, focus on comfort and not pursue any aggressive treatments IDT: updated hospice team Family: Daughter [name] is [employer] and caregiver, Legrand Como is her HCPOA.  These are the only two information should be shared with.  Transfer summary and med list to chart.  Venia Carbon RN, BSN, [facility] Liaison

## 2019-09-19 NOTE — ED Notes (Signed)
Pt's daughter updated via telephone.

## 2019-09-19 NOTE — ED Triage Notes (Signed)
Pt brought in by EMS from home with c/o right hip pain after experiencing a witnessed mechanical fall 2 days ago when trying to sit down on the couch. Bruising and swelling to right hip present. 100 mcg fentanyl given by EMS prior to arrival. Pt currently denies pain, A&Ox4, VSS, NAD. Indwelling urinary catheter present, placed yesterday by hospice nurse. Pt on 3L [place] chronically. EMS reports no blood thinner use; hx of CHF, angina, dementia, CKD.  EMS v/s: 153/76 BP 99% 3L [place] 64 HR 16 RR

## 2019-09-19 NOTE — ED Notes (Signed)
Pt transported to ultrasound.

## 2019-09-19 NOTE — H&P (Signed)
History and Physical    Jodi [name] ZOX:[id number] DOB: 1932/06/23 DOA: 09/19/2019  PCP: [name], MD (Confirm with patient/family/NH records and if not entered, this has to be entered at [facility] point of entry) Patient coming from: Home  I have personally briefly reviewed patient's old medical records in [place]  Chief Complaint: [place] and back pain  HPI: Jodi [name] is a 86 y.o. female with medical history significant of severe aortic stenosis, moderate mitral regurgitation, CKD stage IV, chronic A. fib not on anticoagulation because of frequent falls, chronic systolic CHF with EF 30 to 35%, moderate pulmonary hypertension, HTN, CAD, chronic back pain, presented with fall and right-sided back pain and hip pain.  Patient had a mechanical fall 2 days ago, when she was trying to sit down on the sofa, instead she said on the wooden handle of the sofa, immediately she felt significant pain on her tailbone and right side of the hip.  Over the last 2 days, patient's daughter, who is a physical therapist has a cast hip to help the patient ambulate, however because of the pain patient was not even able to transfer herself from bed to bedside commode, and had to use diaper since yesterday evening changing diapers according significant pain. Also, she developed bruising and swelling of the right hip and came to ED.  She denied any lightheadedness palpitations before the fall.  No chest pain, abdominal pain. ED Course: CT pelvis right side: acute nondisplaced fracture of the inferior aspect of the right sacral ala adjacent to the SI joint.  Worsening of kidney function creatinine 3.5 compared to 2.3-2.5 baseline, uremia BUN 92.  Review of Systems: As per HPI otherwise 14 point review of systems negative.    Past Medical History:  Diagnosis Date  . Aortic stenosis   . Arthritis    "maybe in my fingers and toes" (09/28/2014)  . Bradycardia   . CHF (congestive heart failure)  ([place])   . Chronic renal insufficiency, stage III (moderate)    [name] 09/27/2014  . Coronary artery disease   . Heart murmur   . Hyperlipidemia   . Hypertension   . Macular degeneration, left eye   . Myocardial infarction [facility]) 1995; 2003  . Paroxysmal atrial fibrillation (HCC)   . Renal cell carcinoma ([place]) 09/23/2012  . Renal mass 12/06/2010   CT Abdomen 11-27-10 upper pole region of the right kidney which is suspicious for solid lesion, measuring 1.9 x 1.3 cm. This lesion is concerning for renal cell carcinoma given the solid appearance.  Following with Dr [name].  Renal bx was benign    . Shortness of breath   . Splenic infarct 12/06/2010  . Stroke [facility]) 2003   "when I had heart surgery"; denies residual on 09/28/2014  . TIA (transient ischemic attack) "several"    Past Surgical History:  Procedure Laterality Date  . CARDIAC CATHETERIZATION    . CARDIOVERSION N/A 10/20/2013   Procedure: CARDIOVERSION;  Surgeon: Laverda Page, MD;  Location: [place];  Service: Cardiovascular;  Laterality: N/A;  H&P in file  . CARDIOVERSION N/A 05/18/2014   Procedure: CARDIOVERSION;  Surgeon: [name], MD;  Location: [facility] ENDOSCOPY;  Service: Cardiovascular;  Laterality: N/A;  . CARDIOVERSION N/A 10/31/2016   Procedure: CARDIOVERSION;  Surgeon: [name], MD;  Location: [facility] ENDOSCOPY;  Service: Cardiovascular;  Laterality: N/A;  . CARDIOVERSION N/A 11/20/2016   Procedure: CARDIOVERSION;  Surgeon: [name], MD;  Location: [place];  Service: Cardiovascular;  Laterality: N/A;  . CATARACT EXTRACTION W/ INTRAOCULAR LENS  IMPLANT, BILATERAL Bilateral ~ 2013  . CORONARY ANGIOPLASTY WITH STENT PLACEMENT  2002  . CORONARY ARTERY BYPASS GRAFT  1995 and 2003  . IR RADIOLOGIST EVAL & MGMT  07/03/2016  . LEFT AND RIGHT HEART CATHETERIZATION WITH CORONARY ANGIOGRAM N/A 11/24/2013   Procedure: LEFT AND RIGHT HEART CATHETERIZATION WITH CORONARY ANGIOGRAM;  Surgeon: Laverda Page, MD;  Location: [facility]  CATH LAB;  Service: Cardiovascular;  Laterality: N/A;  . PERCUTANEOUS NEEDLE BIOPSY OF RENAL LESION  ?2014  . TOTAL ABDOMINAL HYSTERECTOMY  1990's   "both ovaries were full of little tiny sores"  . TYMPANOPLASTY Bilateral    "had holes in them; still have holes in them"     reports that she has never smoked. She has never used smokeless tobacco. She reports that she does not drink alcohol and does not use drugs.  Allergies  Allergen Reactions  . Sulfamethoxazole-Trimethoprim Other (See Comments)    Caused shaking and chills  . Tape Other (See Comments)    Tears skin.  Please use "paper" tape    Family History  Problem Relation Age of Onset  . Coronary artery disease Mother   . Stroke Mother   . Leukemia Father   . Leukemia Sister   . Cancer Brother   . Coronary artery disease Brother   . Heart attack Brother   . Heart attack Son   . Heart attack Daughter   . Myelodysplastic syndrome Sister      Prior to Admission medications   Medication Sig Start Date End Date Taking? Authorizing Provider  acetaminophen (TYLENOL) 500 MG tablet Take 500 mg by mouth every 6 (six) hours as needed for mild pain or headache.   Yes [provider]  ALPRAZolam (XANAX) 0.25 MG tablet Take 2 tablets (0.5 mg total) by mouth at bedtime as needed for anxiety. Patient taking differently: Take 0.5 mg by mouth at bedtime.  07/15/19  Yes Chambliss, Jeb Levering, MD  Cranberry-Vitamin C (AZO CRANBERRY URINARY TRACT PO) Take 1 tablet by mouth daily.   Yes [provider]  furosemide (LASIX) 40 MG tablet 1-3 per day as needed Patient taking differently: Take 120 mg by mouth daily.  09/02/19  Yes Chambliss, Jeb Levering, MD  gabapentin (NEURONTIN) 100 MG capsule USE 2-5 CAPS AT BEDTIME AS NEEDED Patient taking differently: Take 200 mg by mouth at bedtime.  08/31/19  Yes Chambliss, Jeb Levering, MD  indomethacin (INDOCIN) 50 MG capsule TAKE UP TO THREE TIMES A DAY WITH FOOD AS NEEDED FOR LEG PAIN. DO  NOT TAKE W IBUPROFEN Patient taking differently: Take 50 mg by mouth 3 (three) times daily as needed for mild pain. Take up to three times a day with food as needed for leg pain.  Do not take w Ibuprofen 07/30/19  Yes Chambliss, Jeb Levering, MD  isosorbide-hydrALAZINE (BIDIL) 20-37.5 MG tablet Take 2 tablets by mouth 3 (three) times daily. Patient taking differently: Take 1 tablet by mouth in the morning and at bedtime.  05/06/19  Yes [name], MD  magnesium hydroxide (MILK OF MAGNESIA) 400 MG/5ML suspension Take 30 mLs by mouth daily as needed for mild constipation.   Yes [provider]  metolazone (ZAROXOLYN) 5 MG tablet Take 1 tablet (5 mg total) by mouth daily at 2 PM. Patient taking differently: Take 10 mg by mouth daily at 2 PM.  04/30/19  Yes [name], MD  nitroGLYCERIN (NITROSTAT) 0.4 MG SL tablet Place  1 tablet (0.4 mg total) under the tongue every 5 (five) minutes as needed for chest pain (Up to 3 tablets). 04/01/19  Yes Chambliss, Jeb Levering, MD  oxyCODONE (ROXICODONE) 5 MG immediate release tablet Take 1 tablet (5 mg total) by mouth every 4 (four) hours as needed for severe pain. Patient taking differently: Take 10 mg by mouth every 3 (three) hours as needed for severe pain.  07/15/19  Yes [name], MD  Potassium Chloride ER 20 MEQ TBCR Take 20 mEq by mouth in the morning and at bedtime. Patient taking differently: Take 40 mEq by mouth in the morning and at bedtime.  09/02/19  Yes Chambliss, Jeb Levering, MD  senna-docusate (SENEXON-S) 8.6-50 MG tablet Take 2 tablets by mouth 2 (two) times daily. 06/02/19  Yes Chambliss, Jeb Levering, MD  polyethylene glycol (MIRALAX / GLYCOLAX) 17 g packet Take 17 g by mouth 2 (two) times daily. Patient not taking: Reported on 09/19/2019 02/20/19   Lyndee Hensen, DO  PROAIR HFA 108 (90 Base) MCG/ACT inhaler INHALE 1 PUFF INTO THE LUNGS EVERY 6 HOURS AS NEEDED FOR WHEEZING OR SHORTNESS OF BREATH. Patient not taking: Reported on 07/15/2019  08/04/18   [name], MD  temazepam (RESTORIL) 15 MG capsule TAKE 1 CAPSULE (15 MG TOTAL) BY MOUTH AT BEDTIME AS NEEDED FOR SLEEP. DO NOT TAKE WITH XANAX Patient not taking: Reported on 09/19/2019 07/15/19   [name], MD    Physical Exam: Vitals:   09/19/19 1144 09/19/19 1202 09/19/19 1233 09/19/19 1248  BP:   (!) 143/55 139/68  Pulse: 60 63 63 60  Resp: 15 18 18 19   Temp:   98.1 F (36.7 C)   TempSrc:   Oral   SpO2: 98% 98% 99% 98%  Weight:      Height:        Constitutional: NAD, calm, comfortable Vitals:   09/19/19 1144 09/19/19 1202 09/19/19 1233 09/19/19 1248  BP:   (!) 143/55 139/68  Pulse: 60 63 63 60  Resp: 15 18 18 19   Temp:   98.1 F (36.7 C)   TempSrc:   Oral   SpO2: 98% 98% 99% 98%  Weight:      Height:       Eyes: PERRL, lids and conjunctivae normal ENMT: Mucous membranes are dry. Posterior pharynx clear of any exudate or lesions.Normal dentition.  Neck: normal, supple, no masses, no thyromegaly Respiratory: clear to auscultation bilaterally, no wheezing, no crackles. Normal respiratory effort. No accessory muscle use.  Cardiovascular: Irregular heart rate, diminished S1, loud blowing systolic murmur on heart base. No extremity edema. 2+ pedal pulses. No carotid bruits.  Abdomen: no tenderness, no masses palpated. No hepatosplenomegaly. Bowel sounds positive.  Musculoskeletal: Right hip with, tenderness upon palpitation Skin: no rashes, lesions, ulcers. No induration Neurologic: CN 2-12 grossly intact. Sensation intact, DTR normal. Strength 5/5 in all 4.  Psychiatric: Normal judgment and insight. Alert and oriented x 3. Normal mood.     Labs on Admission: I have personally reviewed following labs and imaging studies  CBC: Recent Labs  Lab 09/19/19 0907  WBC 7.3  NEUTROABS 3.9  HGB 8.5*  HCT 30.4*  MCV 84.9  PLT 462   Basic Metabolic Panel: Recent Labs  Lab 09/19/19 0907  NA 139  K 5.1  CL 94*  CO2 32  GLUCOSE 131*    BUN 92*  CREATININE 3.56*  CALCIUM 10.0   GFR: Estimated Creatinine Clearance: 10.6 mL/min (A) (by C-G formula based  on SCr of 3.56 mg/dL (H)). Liver Function Tests: Recent Labs  Lab 09/19/19 0907  AST 18  ALT 10  ALKPHOS 65  BILITOT 1.1  PROT 6.9  ALBUMIN 3.8   No results for input(s): LIPASE, AMYLASE in the last 168 hours. No results for input(s): AMMONIA in the last 168 hours. Coagulation Profile: No results for input(s): INR, PROTIME in the last 168 hours. Cardiac Enzymes: No results for input(s): CKTOTAL, CKMB, CKMBINDEX, TROPONINI in the last 168 hours. BNP (last 3 results) No results for input(s): PROBNP in the last 8760 hours. HbA1C: No results for input(s): HGBA1C in the last 72 hours. CBG: No results for input(s): GLUCAP in the last 168 hours. Lipid Profile: No results for input(s): CHOL, HDL, LDLCALC, TRIG, CHOLHDL, LDLDIRECT in the last 72 hours. Thyroid Function Tests: No results for input(s): TSH, T4TOTAL, FREET4, T3FREE, THYROIDAB in the last 72 hours. Anemia Panel: No results for input(s): VITAMINB12, FOLATE, FERRITIN, TIBC, IRON, RETICCTPCT in the last 72 hours. Urine analysis:    Component Value Date/Time   COLORURINE YELLOW 02/15/2019 0336   APPEARANCEUR HAZY (A) 02/15/2019 0336   LABSPEC 1.011 02/15/2019 0336   PHURINE 6.0 02/15/2019 0336   GLUCOSEU NEGATIVE 02/15/2019 0336   HGBUR NEGATIVE 02/15/2019 0336   HGBUR moderate 10/07/2008 1055   BILIRUBINUR NEGATIVE 02/15/2019 0336   BILIRUBINUR negative 01/27/2018 1340   BILIRUBINUR NEG 03/11/2014 1010   KETONESUR NEGATIVE 02/15/2019 0336   PROTEINUR NEGATIVE 02/15/2019 0336   UROBILINOGEN 0.2 01/27/2018 1340   UROBILINOGEN 0.2 08/20/2013 2000   NITRITE NEGATIVE 02/15/2019 0336   LEUKOCYTESUR MODERATE (A) 02/15/2019 0336    Radiological Exams on Admission: DG Shoulder Right  Result Date: 09/19/2019 CLINICAL DATA:  Mechanical fall 2 days ago with shoulder pain. EXAM: RIGHT SHOULDER - 2+ VIEW  COMPARISON:  None. FINDINGS: There is no evidence of fracture or dislocation. There is no evidence of arthropathy or other focal bone abnormality. Soft tissues are unremarkable. IMPRESSION: Negative. Electronically Signed   By: Zerita Boers M.D.   On: 09/19/2019 09:56   CT Hip Right Wo Contrast  Result Date: 09/19/2019 CLINICAL DATA:  Status post fall, right hip pain. Fell 2 days ago. EXAM: CT OF THE RIGHT HIP WITHOUT CONTRAST TECHNIQUE: Multidetector CT imaging of the right hip was performed according to the standard protocol. Multiplanar CT image reconstructions were also generated. COMPARISON:  None. FINDINGS: Bones/Joint/Cartilage Generalized osteopenia. No hip fracture or dislocation. Acute nondisplaced fracture of the inferior aspect of the right sacral ala adjacent to the SI joint. Normal alignment. No joint effusion. Moderate osteoarthritis of the right hip with small marginal osteophytes and moderate joint space narrowing. Mild osteoarthritis of the right SI joint. Ligaments Ligaments are suboptimally evaluated by CT. Muscles and Tendons Muscles are normal. No intramuscular fluid collection or hematoma. Soft tissue Small hemorrhagic contusion in the subcutaneous fat overlying the right greater trochanter with surrounding inflammatory changes. No other fluid collection. Peripheral vascular atherosclerotic disease. Small amount of pelvic free fluid. IMPRESSION: 1. Acute nondisplaced fracture of the inferior aspect of the right sacral ala adjacent to the SI joint. 2. No acute hip fracture or dislocation. 3. Small hemorrhagic contusion in the subcutaneous fat overlying the right greater trochanter with surrounding inflammatory changes. 4. Generalized osteopenia. Electronically Signed   By: Kathreen Devoid   On: 09/19/2019 12:24   DG Foot Complete Right  Result Date: 09/19/2019 CLINICAL DATA:  Recent fall with calcaneal pain, initial encounter EXAM: RIGHT FOOT COMPLETE - 3+ VIEW COMPARISON:  None.  FINDINGS: Mild osteopenia is noted. No acute fracture or dislocation is noted. Tarsal degenerative changes are seen. IMPRESSION: No acute abnormality noted. Electronically Signed   By: Inez Catalina M.D.   On: 09/19/2019 11:22   DG HIP UNILAT WITH PELVIS 2-3 VIEWS LEFT  Result Date: 09/19/2019 CLINICAL DATA:  Right hip pain after a mechanical fall. EXAM: DG HIP (WITH OR WITHOUT PELVIS) 2-3V LEFT COMPARISON:  None. FINDINGS: There is no evidence of hip fracture or dislocation. Degenerative changes are seen in the spine and hips. IMPRESSION: No acute osseous injury. Electronically Signed   By: Zerita Boers M.D.   On: 09/19/2019 09:58   DG HIP UNILAT WITH PELVIS 2-3 VIEWS RIGHT  Result Date: 09/19/2019 CLINICAL DATA:  Hip pain after a mechanical fall. EXAM: DG HIP (WITH OR WITHOUT PELVIS) 2-3V RIGHT COMPARISON:  None. FINDINGS: There is no evidence of hip fracture or dislocation. Degenerative changes are seen in the spine and hips. IMPRESSION: No acute osseous injury. Electronically Signed   By: Zerita Boers M.D.   On: 09/19/2019 09:58    EKG: Independently reviewed.  A. fib chronic  Assessment/Plan Active Problems:   Sacral fracture, closed ([place])  (please populate well all problems here in Problem List. (For example, if patient is on BP meds at home and you resume or decide to hold them, it is a problem that needs to be her. Same for CAD, COPD, HLD and so on)  Right sacral nondisplaced fracture with right hip contusion -Orthopedic surgery was contacted, recommend conservative management pain control and PT evaluation -Image study implant patient has osteoporosis, will check vitamin D level, calcium level within normal limits, most likely patient has a tertiary hyperparathyroidism secondary to her CKD, will check phosphorus level. -Pain control -Discussed with daughter, who is very willing to take patient back however she want to make sure patient able to transfer herself from bed to commode and  able to sit on commode.  AKI on CKD with worsening azotemia and chronic uremia -Discussed with daughter, patient is palliative care not a HD candidate -Patient looks dry on physical exam, will hold diuresis for today and recheck kidney kidney function tomorrow -Renal ultrasound -Last seen by cardiologist January 2021, recommend continue high dose of diuresis despite of worsening of kidney function.  Osteopenia/osteoporosis -As above  Severe aortic stenosis -Combined with advanced CHF, cardiology considered his end-stage condition, recommend palliative care and continue high dose of diuresis.  As above, patient looks dehydrated on exam will hold diuresis daily.  Chronic systolic CHF -Continue hydralazine and Imdur, hold diuresis today as above  DVT prophylaxis: Heparin subcu Code Status: DNR Family Communication: Daughter over phone Disposition Plan: Expect 1-2 day hospital stay Consults called: Orthopedic surgery Admission status: MedSurg admission   Lequita Halt MD [facility] Pager [phone]  09/19/2019, 1:46 PM

## 2019-09-19 NOTE — ED Provider Notes (Signed)
[facility] EMERGENCY DEPARTMENT Provider Note   CSN: [id number] Arrival date & time: 09/19/19  0758     History Chief Complaint  Patient presents with  . Fall    Jodi [name] is a 86 y.o. female.  HPI  Patient with a past medical history detailed below presents today for mechanical fall that occurred 2 days ago when trying to sit in a chair when she sat on the arm of it and it spilled her onto the ground where she struck her right hip on the floor.  She not hit her head or lose consciousness.  Denies any nausea, vomiting, diarrhea.  Denies any lightheadedness or dizziness or any chest pain or shortness of breath.  She states she is feeling somewhat fatigued last 2 days states she is still eating and drinking normally states she has had somewhat less frequent bowel movements and urination however states that she feels somewhat dehydrated.  She describes right hip pain is achy, constant, with movement and touch.  She normally walks with a walker but states it has been too painful to do so recently.  She has taken nothing prior to arrival ED.  No associated symptoms.  No aggravating or mitigating factors.     Past Medical History:  Diagnosis Date  . Aortic stenosis   . Arthritis    "maybe in my fingers and toes" (09/28/2014)  . Bradycardia   . CHF (congestive heart failure) ([place])   . Chronic renal insufficiency, stage III (moderate)    [name] 09/27/2014  . Coronary artery disease   . Heart murmur   . Hyperlipidemia   . Hypertension   . Macular degeneration, left eye   . Myocardial infarction [facility]) 1995; 2003  . Paroxysmal atrial fibrillation (HCC)   . Renal cell carcinoma ([place]) 09/23/2012  . Renal mass 12/06/2010   CT Abdomen 11-27-10 upper pole region of the right kidney which is suspicious for solid lesion, measuring 1.9 x 1.3 cm. This lesion is concerning for renal cell carcinoma given the solid appearance.  Following with Dr [name].  Renal bx was benign     . Shortness of breath   . Splenic infarct 12/06/2010  . Stroke [facility]) 2003   "when I had heart surgery"; denies residual on 09/28/2014  . TIA (transient ischemic attack) "several"    Patient Active Problem List   Diagnosis Date Noted  . Sacral fracture, closed ([place]) 09/19/2019  . Tremor 04/22/2019  . Anxiety state   . DNR (do not resuscitate)   . Chronic atrial fibrillation ([place])   . Palliative care by specialist   . AKI (acute kidney injury) ([place])   . Normocytic anemia   . Fall 01/21/2019  . Gait instability 01/21/2019  . Foot pain 12/17/2018  . Acute on chronic combined systolic and diastolic heart failure ([place]) 08/26/2018  . Goals of care, counseling/discussion 02/14/2018  . Acute on chronic diastolic (congestive) heart failure ([place]) 12/04/2016  . Thyroid nodule 08/21/2016  . Hyperglycemia 11/09/2015  . Chronic combined systolic and diastolic congestive heart failure ([place]) 03/04/2015  . Fatigue 02/17/2014  . Renal cell carcinoma ([place]) 09/23/2012  . Chronic kidney disease (CKD), stage III (moderate) ([place]) 02/20/2012  . Aortic stenosis, severe 12/06/2010  . SEBORRHEA 11/10/2007  . Insomnia 06/09/2007  . HYPERCHOLESTEROLEMIA 04/04/2006  . HYPERTENSION, BENIGN SYSTEMIC 04/04/2006  . CORONARY, ARTERIOSCLEROSIS 04/04/2006  . Acute combined systolic and diastolic heart failure ([place]) 04/04/2006  . Osteoarthritis involving multiple joints on both sides of body  04/04/2006    Past Surgical History:  Procedure Laterality Date  . CARDIAC CATHETERIZATION    . CARDIOVERSION N/A 10/20/2013   Procedure: CARDIOVERSION;  Surgeon: Laverda Page, MD;  Location: [facility];  Service: Cardiovascular;  Laterality: N/A;  H&P in file  . CARDIOVERSION N/A 05/18/2014   Procedure: CARDIOVERSION;  Surgeon: [name], MD;  Location: [facility] ENDOSCOPY;  Service: Cardiovascular;  Laterality: N/A;  . CARDIOVERSION N/A 10/31/2016   Procedure: CARDIOVERSION;  Surgeon: [name], MD;  Location: [facility]  ENDOSCOPY;  Service: Cardiovascular;  Laterality: N/A;  . CARDIOVERSION N/A 11/20/2016   Procedure: CARDIOVERSION;  Surgeon: [name], MD;  Location: [place];  Service: Cardiovascular;  Laterality: N/A;  . CATARACT EXTRACTION W/ INTRAOCULAR LENS  IMPLANT, BILATERAL Bilateral ~ 2013  . CORONARY ANGIOPLASTY WITH STENT PLACEMENT  2002  . CORONARY ARTERY BYPASS GRAFT  1995 and 2003  . IR RADIOLOGIST EVAL & MGMT  07/03/2016  . LEFT AND RIGHT HEART CATHETERIZATION WITH CORONARY ANGIOGRAM N/A 11/24/2013   Procedure: LEFT AND RIGHT HEART CATHETERIZATION WITH CORONARY ANGIOGRAM;  Surgeon: Laverda Page, MD;  Location: [facility] CATH LAB;  Service: Cardiovascular;  Laterality: N/A;  . PERCUTANEOUS NEEDLE BIOPSY OF RENAL LESION  ?2014  . TOTAL ABDOMINAL HYSTERECTOMY  1990's   "both ovaries were full of little tiny sores"  . TYMPANOPLASTY Bilateral    "had holes in them; still have holes in them"     OB History   No obstetric history on file.     Family History  Problem Relation Age of Onset  . Coronary artery disease Mother   . Stroke Mother   . Leukemia Father   . Leukemia Sister   . Cancer Brother   . Coronary artery disease Brother   . Heart attack Brother   . Heart attack Son   . Heart attack Daughter   . Myelodysplastic syndrome Sister     Social History   Tobacco Use  . Smoking status: Never Smoker  . Smokeless tobacco: Never Used  Vaping Use  . Vaping Use: Never used  Substance Use Topics  . Alcohol use: No  . Drug use: No    Home Medications Prior to Admission medications   Medication Sig Start Date End Date Taking? Authorizing Provider  acetaminophen (TYLENOL) 500 MG tablet Take 500 mg by mouth every 6 (six) hours as needed for mild pain or headache.   Yes [provider]  ALPRAZolam (XANAX) 0.25 MG tablet Take 2 tablets (0.5 mg total) by mouth at bedtime as needed for anxiety. Patient taking differently: Take 0.5 mg by mouth at bedtime.  07/15/19  Yes  Chambliss, Jeb Levering, MD  Cranberry-Vitamin C (AZO CRANBERRY URINARY TRACT PO) Take 1 tablet by mouth daily.   Yes [provider]  furosemide (LASIX) 40 MG tablet 1-3 per day as needed Patient taking differently: Take 120 mg by mouth daily.  09/02/19  Yes Chambliss, Jeb Levering, MD  gabapentin (NEURONTIN) 100 MG capsule USE 2-5 CAPS AT BEDTIME AS NEEDED Patient taking differently: Take 200 mg by mouth at bedtime.  08/31/19  Yes Chambliss, Jeb Levering, MD  indomethacin (INDOCIN) 50 MG capsule TAKE UP TO THREE TIMES A DAY WITH FOOD AS NEEDED FOR LEG PAIN. DO NOT TAKE W IBUPROFEN Patient taking differently: Take 50 mg by mouth 3 (three) times daily as needed for mild pain. Take up to three times a day with food as needed for leg pain.  Do not take w Ibuprofen 07/30/19  Yes Chambliss, Jeb Levering, MD  isosorbide-hydrALAZINE (BIDIL) 20-37.5 MG tablet Take 2 tablets by mouth 3 (three) times daily. Patient taking differently: Take 1 tablet by mouth in the morning and at bedtime.  05/06/19  Yes [name], MD  magnesium hydroxide (MILK OF MAGNESIA) 400 MG/5ML suspension Take 30 mLs by mouth daily as needed for mild constipation.   Yes [provider]  metolazone (ZAROXOLYN) 5 MG tablet Take 1 tablet (5 mg total) by mouth daily at 2 PM. Patient taking differently: Take 10 mg by mouth daily at 2 PM.  04/30/19  Yes [name], MD  nitroGLYCERIN (NITROSTAT) 0.4 MG SL tablet Place 1 tablet (0.4 mg total) under the tongue every 5 (five) minutes as needed for chest pain (Up to 3 tablets). 04/01/19  Yes Chambliss, Jeb Levering, MD  oxyCODONE (ROXICODONE) 5 MG immediate release tablet Take 1 tablet (5 mg total) by mouth every 4 (four) hours as needed for severe pain. Patient taking differently: Take 10 mg by mouth every 3 (three) hours as needed for severe pain.  07/15/19  Yes [name], MD  Potassium Chloride ER 20 MEQ TBCR Take 20 mEq by mouth in the morning and at bedtime. Patient taking  differently: Take 40 mEq by mouth in the morning and at bedtime.  09/02/19  Yes Chambliss, Jeb Levering, MD  senna-docusate (SENEXON-S) 8.6-50 MG tablet Take 2 tablets by mouth 2 (two) times daily. 06/02/19  Yes Chambliss, Jeb Levering, MD  polyethylene glycol (MIRALAX / GLYCOLAX) 17 g packet Take 17 g by mouth 2 (two) times daily. Patient not taking: Reported on 09/19/2019 02/20/19   Lyndee Hensen, DO  PROAIR HFA 108 (90 Base) MCG/ACT inhaler INHALE 1 PUFF INTO THE LUNGS EVERY 6 HOURS AS NEEDED FOR WHEEZING OR SHORTNESS OF BREATH. Patient not taking: Reported on 07/15/2019 08/04/18   [name], MD  temazepam (RESTORIL) 15 MG capsule TAKE 1 CAPSULE (15 MG TOTAL) BY MOUTH AT BEDTIME AS NEEDED FOR SLEEP. DO NOT TAKE WITH XANAX Patient not taking: Reported on 09/19/2019 07/15/19   [name], MD    Allergies    Sulfamethoxazole-trimethoprim and Tape  Review of Systems   Review of Systems  Constitutional: Negative for chills and fever.  HENT: Negative for congestion.   Eyes: Negative for pain.  Respiratory: Negative for cough and shortness of breath.   Cardiovascular: Negative for chest pain and leg swelling.  Gastrointestinal: Negative for abdominal pain and vomiting.  Genitourinary: Negative for dysuria.  Musculoskeletal: Negative for myalgias.       Right hip pain right foot pain and right shoulder pain  Skin: Negative for rash.  Neurological: Negative for dizziness and headaches.    Physical Exam Updated Vital Signs BP (!) 135/95   Pulse 63   Temp 97.7 F (36.5 C) (Oral)   Resp 16   Ht 5\' 5"  (1.651 m)   Wt 68 kg   SpO2 99%   BMI 24.96 kg/m   Physical Exam Vitals and nursing note reviewed.  Constitutional:      General: She is not in acute distress.    Comments: Patient is well-appearing 86 year old female in no acute distress but uncomfortable in bed.  HENT:     Head: Normocephalic and atraumatic.     Nose: Nose normal.  Eyes:     General: No scleral  icterus. Cardiovascular:     Rate and Rhythm: Normal rate and regular rhythm.     Pulses: Normal pulses.  Heart sounds: Normal heart sounds.  Pulmonary:     Effort: Pulmonary effort is normal. No respiratory distress.     Breath sounds: No wheezing.  Abdominal:     Palpations: Abdomen is soft.     Tenderness: There is no abdominal tenderness. There is no guarding or rebound.  Musculoskeletal:     Cervical back: Normal range of motion.     Right lower leg: No edema.     Left lower leg: No edema.     Comments: Significant bruising over the right proximal femur on the lateral side of right leg.  Mild tenderness to palpation of right anterior superior iliac crest.  Right calcaneal tenderness to palpation.  Right shoulder tenderness to palpation that is diffuse, full range of motion intact.  Strength intact in all 4 extremities.  Skin:    General: Skin is warm and dry.     Capillary Refill: Capillary refill takes less than 2 seconds.  Neurological:     Mental Status: She is alert. Mental status is at baseline.  Psychiatric:        Mood and Affect: Mood normal.        Behavior: Behavior normal.     ED Results / Procedures / Treatments   Labs (all labs ordered are listed, but only abnormal results are displayed) Labs Reviewed  COMPREHENSIVE METABOLIC PANEL - Abnormal; Notable for the following components:      Result Value   Chloride 94 (*)    Glucose, Bld 131 (*)    BUN 92 (*)    Creatinine, Ser 3.56 (*)    GFR calc non Af Amer 11 (*)    GFR calc Af Amer 13 (*)    All other components within normal limits  CBC WITH DIFFERENTIAL/PLATELET - Abnormal; Notable for the following components:   RBC 3.58 (*)    Hemoglobin 8.5 (*)    HCT 30.4 (*)    MCH 23.7 (*)    MCHC 28.0 (*)    RDW 18.2 (*)    All other components within normal limits  SARS CORONAVIRUS 2 BY RT PCR (HOSPITAL ORDER, [place] [facility])  VITAMIN D 25 HYDROXY (VIT D DEFICIENCY, FRACTURES)    PHOSPHORUS    EKG None  Radiology DG Shoulder Right  Result Date: 09/19/2019 CLINICAL DATA:  Mechanical fall 2 days ago with shoulder pain. EXAM: RIGHT SHOULDER - 2+ VIEW COMPARISON:  None. FINDINGS: There is no evidence of fracture or dislocation. There is no evidence of arthropathy or other focal bone abnormality. Soft tissues are unremarkable. IMPRESSION: Negative. Electronically Signed   By: Zerita Boers M.D.   On: 09/19/2019 09:56   US RENAL  Result Date: 09/19/2019 CLINICAL DATA:  Acute kidney injury EXAM: RENAL / URINARY TRACT ULTRASOUND COMPLETE COMPARISON:  02/15/2019 FINDINGS: Right Kidney: Renal measurements: 11.2 x 4.5 x 5.9 cm = volume: 157 mL . Echogenicity within normal limits. No hydronephrosis visualized. 3.7 x 1.7 x 2.5 cm anechoic right renal mass consistent with a cyst. 2.4 x 2 x 1.7 cm anechoic right inferior pole renal mass consistent with a cyst. 2.3 x 2 x 2.3 cm anechoic interpolar renal mass consistent with a cyst. Left Kidney: Renal measurements: 11.2 x 5.2 x 5.3 cm = volume: 163 mL. Echogenicity within normal limits. No hydronephrosis visualized. Three anechoic left renal masses most consistent with cysts with the upper pole cyst measuring 2.1 x 1.7 x 1.7 cm, interpolar cyst measuring 2.4 x 2.1 x 2.3 cm  and lower pole renal cyst measuring 2 x 2 x 2 cm. Bladder: Decompressed bladder with a Foley catheter present. Other: None. IMPRESSION: 1. No obstructive uropathy. 2. Bilateral renal cysts. Electronically Signed   By: Kathreen Devoid   On: 09/19/2019 15:08   CT Hip Right Wo Contrast  Result Date: 09/19/2019 CLINICAL DATA:  Status post fall, right hip pain. Fell 2 days ago. EXAM: CT OF THE RIGHT HIP WITHOUT CONTRAST TECHNIQUE: Multidetector CT imaging of the right hip was performed according to the standard protocol. Multiplanar CT image reconstructions were also generated. COMPARISON:  None. FINDINGS: Bones/Joint/Cartilage Generalized osteopenia. No hip fracture or  dislocation. Acute nondisplaced fracture of the inferior aspect of the right sacral ala adjacent to the SI joint. Normal alignment. No joint effusion. Moderate osteoarthritis of the right hip with small marginal osteophytes and moderate joint space narrowing. Mild osteoarthritis of the right SI joint. Ligaments Ligaments are suboptimally evaluated by CT. Muscles and Tendons Muscles are normal. No intramuscular fluid collection or hematoma. Soft tissue Small hemorrhagic contusion in the subcutaneous fat overlying the right greater trochanter with surrounding inflammatory changes. No other fluid collection. Peripheral vascular atherosclerotic disease. Small amount of pelvic free fluid. IMPRESSION: 1. Acute nondisplaced fracture of the inferior aspect of the right sacral ala adjacent to the SI joint. 2. No acute hip fracture or dislocation. 3. Small hemorrhagic contusion in the subcutaneous fat overlying the right greater trochanter with surrounding inflammatory changes. 4. Generalized osteopenia. Electronically Signed   By: Kathreen Devoid   On: 09/19/2019 12:24   Chest Portable 1 View  Result Date: 09/19/2019 CLINICAL DATA:  Fall 2 days ago with right hip pain. EXAM: PORTABLE CHEST 1 VIEW COMPARISON:  12/23/2018 FINDINGS: Lungs are adequately inflated without focal consolidation or effusion. Subtle prominence of the perihilar vessels likely mild vascular congestion. Moderate stable cardiomegaly. Remainder the exam is unchanged. IMPRESSION: Moderate stable cardiomegaly with suggestion of minimal vascular congestion. Electronically Signed   By: Marin Olp M.D.   On: 09/19/2019 14:35   DG Foot Complete Right  Result Date: 09/19/2019 CLINICAL DATA:  Recent fall with calcaneal pain, initial encounter EXAM: RIGHT FOOT COMPLETE - 3+ VIEW COMPARISON:  None. FINDINGS: Mild osteopenia is noted. No acute fracture or dislocation is noted. Tarsal degenerative changes are seen. IMPRESSION: No acute abnormality noted.  Electronically Signed   By: Inez Catalina M.D.   On: 09/19/2019 11:22   DG HIP UNILAT WITH PELVIS 2-3 VIEWS LEFT  Result Date: 09/19/2019 CLINICAL DATA:  Right hip pain after a mechanical fall. EXAM: DG HIP (WITH OR WITHOUT PELVIS) 2-3V LEFT COMPARISON:  None. FINDINGS: There is no evidence of hip fracture or dislocation. Degenerative changes are seen in the spine and hips. IMPRESSION: No acute osseous injury. Electronically Signed   By: Zerita Boers M.D.   On: 09/19/2019 09:58   DG HIP UNILAT WITH PELVIS 2-3 VIEWS RIGHT  Result Date: 09/19/2019 CLINICAL DATA:  Hip pain after a mechanical fall. EXAM: DG HIP (WITH OR WITHOUT PELVIS) 2-3V RIGHT COMPARISON:  None. FINDINGS: There is no evidence of hip fracture or dislocation. Degenerative changes are seen in the spine and hips. IMPRESSION: No acute osseous injury. Electronically Signed   By: Zerita Boers M.D.   On: 09/19/2019 09:58    Procedures Procedures (including critical care time)  Medications Ordered in ED Medications  fentaNYL (SUBLIMAZE) injection 50 mcg (50 mcg Intravenous Given 09/19/19 1238)  albuterol (PROVENTIL) (2.5 MG/3ML) 0.083% nebulizer solution 2.5 mg (has no administration  in time range)  acetaminophen (TYLENOL) tablet 500 mg (has no administration in time range)  oxyCODONE (Oxy IR/ROXICODONE) immediate release tablet 5 mg (has no administration in time range)  isosorbide-hydrALAZINE (BIDIL) 20-37.5 MG per tablet 1 tablet (has no administration in time range)  ALPRAZolam (XANAX) tablet 0.5 mg (has no administration in time range)  magnesium hydroxide (MILK OF MAGNESIA) suspension 30 mL (has no administration in time range)  senna-docusate (Senokot-S) tablet 2 tablet (2 tablets Oral Given 09/19/19 1546)  ascorbic acid (VITAMIN C) tablet 250 mg (250 mg Oral Given 09/19/19 1546)  gabapentin (NEURONTIN) capsule 200 mg (has no administration in time range)  Vitamin D (Ergocalciferol) (DRISDOL) capsule 50,000 Units (has no  administration in time range)  heparin injection 5,000 Units (5,000 Units Subcutaneous Given 09/19/19 1547)  HYDROmorphone (DILAUDID) injection 0.5 mg (has no administration in time range)  docusate sodium (COLACE) capsule 100 mg (100 mg Oral Given 09/19/19 1547)    ED Course  I have reviewed the triage vital signs and the nursing notes.  Pertinent labs & imaging results that were available during my care of the patient were reviewed by me and considered in my medical decision making (see chart for details).  Patient is 86 year old female with past medical history detailed above presented today for pain in her right hip, right heel and right shoulder after fall.  It was mechanical fall.  As patient is a somewhat poor historian and the fall occurred 2 days ago we will obtain basic labs.  Also concern for right hip fracture given the tenderness palpation of right hip and bruising of the right proximal femur region on physical exam.  Will obtain plain film imaging if negative for hip fracture will consider right hip CT imaging.  Clinical Course as of Sep 19 1615  Sat Sep 19, 2019  1244 IMPRESSION: 1. Acute nondisplaced fracture of the inferior aspect of the right sacral ala adjacent to the SI joint. 2. No acute hip fracture or dislocation. 3. Small hemorrhagic contusion in the subcutaneous fat overlying the right greater trochanter with surrounding inflammatory changes. 4. Generalized osteopenia.   [WF]  2919 Zhang will admit patient for PT/OT ortho follow up and AKI monitoring and therapy.   [WF]  1317 Plain film of right foot without any acute abnormality. Plain films of bilateral hips and pelvis without any acute fracture. Right shoulder without any abnormality. I reviewed x-rays and agree of radiology read. However because patient is having pain with ambulation and has significant bruising over the right hip will obtain CT imaging to rule out occult fracture.  CMP notable for elevated BUN  and creatinine. Creatinine is significantly elevated from patient's baseline. No specific finding for this. Patient does appear somewhat marginally dehydrated does not appear fluid overloaded. Does have CHF may be overly diuresed however this is a significant change and patient would benefit from repeat labs in the a.m. CBC without leukocytosis mild chronic anemia appears unchanged. Covid swab pending.   [WF]  1660 I discussed this case with my attending physician who cosigned this note including patient's presenting symptoms, physical exam, and planned diagnostics and interventions. Attending physician stated agreement with plan or made changes to plan which were implemented.   Attending physician assessed patient at bedside.   [WF]    Clinical Course User Index [WF] Tedd Sias, [place]   MDM Rules/Calculators/A&P  Patient admitted to hospitalist service for AKI. She is also positive for sacral fracture.  She was informed of these abnormalities and of the plan.  She is agreeable to plan.  Final Clinical Impression(s) / ED Diagnoses Final diagnoses:  Fall, initial encounter  Closed fracture of sacrum, unspecified portion of sacrum, initial encounter [facility])    Rx / [place] Orders ED Discharge Orders    None       Tedd Sias, [place] 09/19/19 1619    Breck Coons, MD 09/20/19 [id number]

## 2019-09-19 NOTE — Plan of Care (Signed)
  Problem: Activity: Goal: Risk for activity intolerance will decrease Outcome: Progressing   Problem: Pain Managment: Goal: General experience of comfort will improve Outcome: Progressing   Problem: Safety: Goal: Ability to remain free from injury will improve Outcome: Progressing   

## 2019-09-19 NOTE — ED Notes (Signed)
Pt transported to CT ?

## 2019-09-19 NOTE — Evaluation (Signed)
Physical Therapy Evaluation Patient Details Name: Jodi [name] MRN: [id number] DOB: 12/30/1932 Today's Date: 09/19/2019   History of Present Illness  86 y.o. female with medical history significant of severe aortic stenosis, moderate mitral regurgitation, CKD stage IV, chronic A. fib not on anticoagulation because of frequent falls, chronic systolic CHF, moderate pulmonary hypertension, HTN, CAD, chronic back pain. Admitted for fall resulting in sacral fracture.    Clinical Impression  Pt admitted with above diagnosis. Currently demonstrates ability to perform bed mobility and transfer with a rolling walker at a supervision level. Daughter is primary caregiver for patient (I spoke with her over the phone.) Her main concern is patient being unable to safely mobilize/transfer if pain is too great. Based on my assessment this afternoon she is capable of transferring safely at a supervision level and daughter feels comfortable taking care of Jodi [name] at this level. However, if pain increases profoundly as she returns to her baseline pain management regimen and is unable to safely mobilize, daughter cannot care for her and would likely need short term SNF placement. Discussed with RN who will monitor function this evening as meds are tapered. Pt currently with functional limitations due to the deficits listed below (see PT Problem List). Pt will benefit from skilled PT to increase their independence and safety with mobility to allow discharge to the venue listed below.       Follow Up Recommendations Home health PT;Supervision - Intermittent (Consider HHPT for safety eval due to falls at home)    Equipment Recommendations  None recommended by PT    Recommendations for Other Services       Precautions / Restrictions Precautions Precautions: Fall Restrictions Weight Bearing Restrictions: No      Mobility  Bed Mobility Overal bed mobility: Needs Assistance Bed Mobility:  Rolling;Sidelying to Sit Rolling: Supervision Sidelying to sit: Supervision       General bed mobility comments: Supervision for safety. Practiced log roll towards Left side (pt states typical of home set-up.) Performed slowly with HOB flat, cues for technique, no overt pain reported during transition to EOB.   Transfers Overall transfer level: Needs assistance Equipment used: Rolling walker (2 wheeled) Transfers: Sit to/from [facility] Sit to Stand: Supervision Stand pivot transfers: Supervision       General transfer comment: Supervision for safety. Cues for hand placement. Slow to rise but stable with RW upon standing. Performed from low bed setting and recliner. No physical assist required. A little pain with pivot however pt reports tolerable, and better than she anticipated.  Ambulation/Gait                Stairs            Wheelchair Mobility    Modified Rankin (Stroke Patients Only)       Balance                                             Pertinent Vitals/Pain Pain Assessment: Faces Faces Pain Scale: Hurts little more Pain Location: Rt hip Pain Descriptors / Indicators: Aching;Constant Pain Intervention(s): Monitored during session;Repositioned    Home Living Family/patient expects to be discharged to:: Private residence Living Arrangements: Children Available Help at Discharge: Family;Available PRN/intermittently   Home Access: Ramped entrance     Home Layout: One level Home Equipment: [place] - 4 wheels;Cane - single point Additional Comments:  Daughter cares for Jodi [name] but has a daughter with CP and has to care for child as well.    Prior Function Level of Independence: Independent with assistive device(s)         Comments: Rollator for mobility, has hx of falls     Hand Dominance   Dominant Hand: Right    Extremity/Trunk Assessment   Upper Extremity Assessment Upper Extremity  Assessment: Defer to OT evaluation    Lower Extremity Assessment Lower Extremity Assessment: Generalized weakness (Pain with Rt hip movement.)       Communication   Communication: HOH  Cognition Arousal/Alertness: Awake/alert Behavior During Therapy: WFL for tasks assessed/performed Overall Cognitive Status: Within Functional Limits for tasks assessed                                        General Comments General comments (skin integrity, edema, etc.): SpO2 lower 90s on 3L supplemental O2 (3-4L baseline per pt report.)    Exercises     Assessment/Plan    PT Assessment Patient needs continued PT services  PT Problem List Decreased strength;Decreased activity tolerance;Decreased balance;Decreased mobility;Pain       PT Treatment Interventions DME instruction;Gait training;Functional mobility training;Therapeutic activities;Therapeutic exercise;Balance training;Patient/family education    PT Goals (Current goals can be found in the Care Plan section)  Acute Rehab PT Goals Patient Stated Goal: Get well PT Goal Formulation: With patient Time For Goal Achievement: 10/03/19 Potential to Achieve Goals: Good    Frequency Min 3X/week   Barriers to discharge Decreased caregiver support (Daughter able to provide some physical assist)      Co-evaluation               AM-PAC PT "6 Clicks" Mobility  Outcome Measure Help needed turning from your back to your side while in a flat bed without using bedrails?: A Little Help needed moving from lying on your back to sitting on the side of a flat bed without using bedrails?: A Little Help needed moving to and from a bed to a chair (including a wheelchair)?: A Little Help needed standing up from a chair using your arms (e.g., wheelchair or bedside chair)?: A Little Help needed to walk in hospital room?: A Little Help needed climbing 3-5 steps with a railing? : A Lot 6 Click Score: 17    End of Session  Equipment Utilized During Treatment: Gait belt Activity Tolerance: Patient tolerated treatment well Patient left: in chair;with call bell/phone within reach;with chair alarm set;with family/visitor present Nurse Communication: Mobility status PT Visit Diagnosis: Unsteadiness on feet (R26.81);Repeated falls (R29.6);Muscle weakness (generalized) (M62.81);History of falling (Z91.81);Difficulty in walking, not elsewhere classified (R26.2);Pain Pain - Right/Left: Right Pain - part of body: Hip (sacrum)    Time: 3817-7116 PT Time Calculation (min) (ACUTE ONLY): 32 min   Charges:   PT Evaluation $PT Eval Moderate Complexity: 1 Mod PT Treatments $Therapeutic Activity: 8-22 mins        Elayne Snare, PT  Ellouise Newer 09/19/2019, 5:37 PM

## 2019-09-19 NOTE — Progress Notes (Signed)
Daughter reported that Pt has been on home hospice 6 months ago, just renewed this week, and Foley was placed in yesterday to address the acute deterioration of ambulation.

## 2019-09-20 ENCOUNTER — Encounter (HOSPITAL_COMMUNITY): Payer: Self-pay | Admitting: Internal Medicine

## 2019-09-20 DIAGNOSIS — S3210XA Unspecified fracture of sacrum, initial encounter for closed fracture: Principal | ICD-10-CM

## 2019-09-20 LAB — CBC
HCT: 25.4 % — ABNORMAL LOW (ref 36.0–46.0)
Hemoglobin: 7.7 g/dL — ABNORMAL LOW (ref 12.0–15.0)
MCH: 25.2 pg — ABNORMAL LOW (ref 26.0–34.0)
MCHC: 30.3 g/dL (ref 30.0–36.0)
MCV: 83 fL (ref 80.0–100.0)
Platelets: 157 10*3/uL (ref 150–400)
RBC: 3.06 MIL/uL — ABNORMAL LOW (ref 3.87–5.11)
RDW: 18.1 % — ABNORMAL HIGH (ref 11.5–15.5)
WBC: 7.7 10*3/uL (ref 4.0–10.5)
nRBC: 0 % (ref 0.0–0.2)

## 2019-09-20 LAB — BASIC METABOLIC PANEL
Anion gap: 12 (ref 5–15)
BUN: 85 mg/dL — ABNORMAL HIGH (ref 8–23)
CO2: 33 mmol/L — ABNORMAL HIGH (ref 22–32)
Calcium: 9.3 mg/dL (ref 8.9–10.3)
Chloride: 92 mmol/L — ABNORMAL LOW (ref 98–111)
Creatinine, Ser: 2.86 mg/dL — ABNORMAL HIGH (ref 0.44–1.00)
GFR calc Af Amer: 17 mL/min — ABNORMAL LOW (ref >60)
GFR calc non Af Amer: 15 mL/min — ABNORMAL LOW (ref >60)
Glucose, Bld: 140 mg/dL — ABNORMAL HIGH (ref 70–99)
Potassium: 3.9 mmol/L (ref 3.5–5.1)
Sodium: 137 mmol/L (ref 135–145)

## 2019-09-20 MED ORDER — POLYETHYLENE GLYCOL 3350 17 G PO PACK
17.0000 g | PACK | Freq: Every day | ORAL | Status: DC | PRN
  Administered 2019-09-20: 17 g via ORAL
  Filled 2019-09-20: qty 1

## 2019-09-20 NOTE — Progress Notes (Signed)
Initial Nutrition Assessment  RD working remotely.  DOCUMENTATION CODES:   Not applicable  INTERVENTION:   -Magic cup BID with meals, each supplement provides 290 kcal and 9 grams of protein  NUTRITION DIAGNOSIS:   Increased nutrient needs related to acute illness (sacral fracture) as evidenced by estimated needs.  GOAL:   Patient will meet greater than or equal to 90% of their needs  MONITOR:   PO intake, Supplement acceptance, Labs, Weight trends, Skin, I & O's  REASON FOR ASSESSMENT:   Consult Hip fracture protocol  ASSESSMENT:   Jodi [name] is a 86 y.o. female with medical history significant of severe aortic stenosis, moderate mitral regurgitation, CKD stage IV, chronic A. fib not on anticoagulation because of frequent falls, chronic systolic CHF with EF 30 to 35%, moderate pulmonary hypertension, HTN, CAD, chronic back pain, presented with fall and right-sided back pain and hip pain.  Pt admitted with rt sacral nondisplaced fracture with rt hip contusion.   Reviewed I/O's: -1.7 L x 24 hours  UOP: 1.7 L x 24 hours  RD attempted to speak with pt via phone call to hospital room, however, no answer.   Per chart review, pt is followed by home hospice and is not an HD candidate.   Pt with good appetite; noted meal completion 100%.   Reviewed wt hx; wt has been stable over the past year.   Medications reviewed and include vitamin C, colace, vitamin D, and senokot.   Labs reviewed: Phos: 6.2.  Diet Order:   Diet Order            Diet renal with fluid restriction Fluid restriction: 1200 mL Fluid; Room service appropriate? Yes; Fluid consistency: Thin  Diet effective now                 EDUCATION NEEDS:   No education needs have been identified at this time  Skin:  Skin Assessment: Reviewed RN Assessment  Last BM:  09/17/19  Height:   Ht Readings from Last 1 Encounters:  09/19/19 5\' 5"  (1.651 m)    Weight:   Wt Readings from Last 1  Encounters:  09/19/19 68 kg    Ideal Body Weight:  56.8 kg  BMI:  Body mass index is 24.96 kg/m.  Estimated Nutritional Needs:   Kcal:  1700-1900  Protein:  80-95 grams  Fluid:  1.2 L    Loistine Chance, RD, LDN, [place] Registered Dietitian II Certified Diabetes Care and Education Specialist Please refer to [employer] for RD and/or RD on-call/weekend/after hours pager

## 2019-09-20 NOTE — Progress Notes (Signed)
Physical Therapy Treatment Patient Details Name: Jodi [name] MRN: [id number] DOB: 1932/03/26 Today's Date: 09/20/2019    History of Present Illness 86 y.o. female with medical history significant of severe aortic stenosis, moderate mitral regurgitation, CKD stage IV, chronic A. fib not on anticoagulation because of frequent falls, chronic systolic CHF, moderate pulmonary hypertension, HTN, CAD, chronic back pain. Admitted for fall resulting in sacral fracture.    PT Comments    Pt is frustrated during session, reports people have been telling her she has dementia when she believes she has not. Also very frustrated over current constipation. Pt performs multiple transfers to/from bedside commode and sit to stands at commode in an attempt to facilitate an easier bowel movement. Pt does all this with minG or close supervision. Pt does require some verbal cues for device management during session but requires very little physical assistance. Pt will continue to benefit from PT POC to improve activity tolerance, device management, and to progress gait training. PT continues to recommend discharge home with HHPT and supervision from family. PT also now recommending a 3 in 1 commode.   Follow Up Recommendations  Home health PT;Supervision - Intermittent     Equipment Recommendations  3in1 (PT)    Recommendations for Other Services       Precautions / Restrictions Precautions Precautions: Fall Restrictions Weight Bearing Restrictions: No    Mobility  Bed Mobility Overal bed mobility: Needs Assistance Bed Mobility: Rolling;Sidelying to Sit Rolling: Supervision Sidelying to sit: Min assist          Transfers Overall transfer level: Needs assistance Equipment used: Rolling walker (2 wheeled) Transfers: Sit to/from [facility] Sit to Stand: Min guard;Supervision (minG progressing to close supervision) Stand pivot transfers: Supervision       General transfer  comment: verbal cues for safety during transfers  Ambulation/Gait Ambulation/Gait assistance: Supervision Gait Distance (Feet): 3 Feet Assistive device: Rolling walker (2 wheeled) Gait Pattern/deviations: Step-to pattern Gait velocity: reduced Gait velocity interpretation: <1.31 ft/sec, indicative of household ambulator General Gait Details: pt with short step to gait during transfer and side stepping to reach head of bed   Stairs             Wheelchair Mobility    Modified Rankin (Stroke Patients Only)       Balance Overall balance assessment: Needs assistance Sitting-balance support: No upper extremity supported;Feet supported Sitting balance-Leahy Scale: Good     Standing balance support: Bilateral upper extremity supported Standing balance-Leahy Scale: Poor Standing balance comment: reliant on UE support of RW                            Cognition Arousal/Alertness: Awake/alert Behavior During Therapy: Anxious Overall Cognitive Status: Impaired/Different from baseline Area of Impairment: Safety/judgement                         Safety/Judgement: Decreased awareness of safety            Exercises      General Comments General comments (skin integrity, edema, etc.): upon PT arrival pt on RA and satting at 84%. PT returned nasal canula to patient on 3L with subsequent improvement in sats to mid 90s      Pertinent Vitals/Pain Pain Assessment: Faces Faces Pain Scale: Hurts little more Pain Location: buttocks Pain Descriptors / Indicators: Grimacing Pain Intervention(s): Monitored during session    Home Living  Prior Function            PT Goals (current goals can now be found in the care plan section) Acute Rehab PT Goals Patient Stated Goal: Get well Progress towards PT goals: Progressing toward goals (slowly)    Frequency    Min 3X/week      PT Plan Current plan remains appropriate     Co-evaluation              AM-PAC PT "6 Clicks" Mobility   Outcome Measure  Help needed turning from your back to your side while in a flat bed without using bedrails?: A Little Help needed moving from lying on your back to sitting on the side of a flat bed without using bedrails?: A Little Help needed moving to and from a bed to a chair (including a wheelchair)?: A Little Help needed standing up from a chair using your arms (e.g., wheelchair or bedside chair)?: A Little Help needed to walk in hospital room?: A Little Help needed climbing 3-5 steps with a railing? : A Lot 6 Click Score: 17    End of Session Equipment Utilized During Treatment: Oxygen Activity Tolerance: Patient tolerated treatment well Patient left: in bed;with call bell/phone within reach;with bed alarm set Nurse Communication: Mobility status PT Visit Diagnosis: Unsteadiness on feet (R26.81);Repeated falls (R29.6);Muscle weakness (generalized) (M62.81);History of falling (Z91.81);Difficulty in walking, not elsewhere classified (R26.2);Pain Pain - Right/Left: Right Pain - part of body: Hip     Time: 2446-2863 PT Time Calculation (min) (ACUTE ONLY): 34 min  Charges:  $Therapeutic Activity: 23-37 mins                     Zenaida Niece, PT, DPT Acute Rehabilitation Pager: [phone]    Zenaida Niece 09/20/2019, 5:52 PM

## 2019-09-20 NOTE — Progress Notes (Addendum)
PROGRESS NOTE    Jodi [name]  PPI:[id number] DOB: 1932/05/12 DOA: 09/19/2019 PCP: [name], MD     Brief Narrative:  Patient is an 86 year old female in hospice care for CHF who presented on 8/14 admitted for a right-sided sacral fracture.  She sustained a fall 2 days prior to coming in.  Nonsurgical management was recommended. She lives with her daughter who is a physical therapist.    New events last 24 hours / Subjective: Has not used any pain medication since initial presentation. Both she and her daughter are vary wary of her mobility and transferring ability. Interested in residential Hospice or SNF.   Assessment & Plan:   Principal Problem:   Sacral fracture, closed ([place])  Appreciate PT, now family interested in SNF as she is not able to transfer as easily as yesterday  Stop Dilaudid and Fentanyl- she has not needed these  Cont oxycodone 5 mg q 4-6 hrs prn pain  Appreciate aid of transitional care team   Active Problems:   Chronic combined systolic and diastolic congestive heart failure (HCC)  Bidil 20-37.5 mg tid    Fall  As above    AKI (acute kidney injury) ([place])  Appears to be around baseline    Anxiety state  Xanax 0.5 mg qhs  Nutrition Problem: Increased nutrient needs Etiology: acute illness (sacral fracture)   DVT prophylaxis:  heparin injection 5,000 Units Start: 09/19/19 1345  Code Status: DNR Family Communication: Daughter at bedside Disposition Plan:   Status is: Inpatient  Remains inpatient appropriate because:Ongoing active pain requiring inpatient pain management   Dispo: The patient is from: Home              Anticipated d/c is to: SNF              Anticipated d/c date is: 1 day              Patient currently is not medically stable to d/c due to fall risk, assessing SNF options.  Consultants:   None  Procedures:   None  Objective: Vitals:   09/19/19 1936 09/19/19 2355 09/20/19 0422 09/20/19 0715  BP: (!)  108/55 (!) 110/48 (!) 122/54 114/65  Pulse: (!) 49 (!) 52 (!) 51 66  Resp: 16 16 17 14   Temp: 98.6 F (37 C) 98.3 F (36.8 C) (!) 97.5 F (36.4 C) 97.9 F (36.6 C)  TempSrc: Oral Oral Oral Oral  SpO2: 93% 96% 100% 97%  Weight:      Height:        Intake/Output Summary (Last 24 hours) at 09/20/2019 1336 Last data filed at 09/20/2019 1100 Gross per 24 hour  Intake 240 ml  Output 1700 ml  Net -1460 ml   Filed Weights   09/19/19 0812  Weight: 68 kg    Examination:  General exam: Appears calm and comfortable  Respiratory system: Clear to auscultation. Respiratory effort normal. No respiratory distress. No conversational dyspnea.  Cardiovascular system: S1 & S2 heard, RRR. Marland Kitchen Gastrointestinal system: Abdomen is nondistended, soft and nontender. Normal bowel sounds heard. Central nervous system: Alert and oriented. Extremities: Symmetric in appearance  Skin: No rashes, lesions or ulcers on exposed skin  Psychiatry: Mood & affect appropriate.   Data Reviewed: I have personally reviewed following labs and imaging studies  CBC: Recent Labs  Lab 09/19/19 0907 09/20/19 0208  WBC 7.3 7.7  NEUTROABS 3.9  --   HGB 8.5* 7.7*  HCT 30.4* 25.4*  MCV 84.9 83.0  PLT 163 573   Basic Metabolic Panel: Recent Labs  Lab 09/19/19 0907 09/19/19 1640 09/20/19 0208  NA 139  --  137  K 5.1  --  3.9  CL 94*  --  92*  CO2 32  --  33*  GLUCOSE 131*  --  140*  BUN 92*  --  85*  CREATININE 3.56*  --  2.86*  CALCIUM 10.0  --  9.3  PHOS  --  6.2*  --    GFR: Estimated Creatinine Clearance: 13.2 mL/min (A) (by C-G formula based on SCr of 2.86 mg/dL (H)). Liver Function Tests: Recent Labs  Lab 09/19/19 0907  AST 18  ALT 10  ALKPHOS 65  BILITOT 1.1  PROT 6.9  ALBUMIN 3.8    Recent Results (from the past 240 hour(s))  SARS Coronavirus 2 by RT PCR (hospital order, performed in [facility] hospital lab) Nasopharyngeal Nasopharyngeal Swab     Status: None   Collection Time: 09/19/19  12:48 PM   Specimen: Nasopharyngeal Swab  Result Value Ref Range Status   SARS Coronavirus 2 NEGATIVE NEGATIVE Final    Comment: (NOTE) SARS-CoV-2 target nucleic acids are NOT DETECTED.  The SARS-CoV-2 RNA is generally detectable in upper and lower respiratory specimens during the acute phase of infection. The lowest concentration of SARS-CoV-2 viral copies this assay can detect is 250 copies / mL. A negative result does not preclude SARS-CoV-2 infection and should not be used as the sole basis for treatment or other patient management decisions.  A negative result may occur with improper specimen collection / handling, submission of specimen other than nasopharyngeal swab, presence of viral mutation(s) within the areas targeted by this assay, and inadequate number of viral copies (<250 copies / mL). A negative result must be combined with clinical observations, patient history, and epidemiological information.  Fact Sheet for Patients:   StrictlyIdeas.no  Fact Sheet for Healthcare Providers: BankingDealers.co.za  This test is not yet approved or  cleared by the [place] FDA and has been authorized for detection and/or diagnosis of SARS-CoV-2 by FDA under an Emergency Use Authorization (EUA).  This EUA will remain in effect (meaning this test can be used) for the duration of the COVID-19 declaration under Section 564(b)(1) of the Act, 21 U.S.C. section 360bbb-3(b)(1), unless the authorization is terminated or revoked sooner.  Performed at [facility], [place] [address] [place], [place] [address]       Radiology Studies: DG Shoulder Right  Result Date: 09/19/2019 CLINICAL DATA:  Mechanical fall 2 days ago with shoulder pain. EXAM: RIGHT SHOULDER - 2+ VIEW COMPARISON:  None. FINDINGS: There is no evidence of fracture or dislocation. There is no evidence of arthropathy or other focal bone abnormality. Soft tissues are  unremarkable. IMPRESSION: Negative. Electronically Signed   By: Zerita Boers M.D.   On: 09/19/2019 09:56   US RENAL  Result Date: 09/19/2019 CLINICAL DATA:  Acute kidney injury EXAM: RENAL / URINARY TRACT ULTRASOUND COMPLETE COMPARISON:  02/15/2019 FINDINGS: Right Kidney: Renal measurements: 11.2 x 4.5 x 5.9 cm = volume: 157 mL . Echogenicity within normal limits. No hydronephrosis visualized. 3.7 x 1.7 x 2.5 cm anechoic right renal mass consistent with a cyst. 2.4 x 2 x 1.7 cm anechoic right inferior pole renal mass consistent with a cyst. 2.3 x 2 x 2.3 cm anechoic interpolar renal mass consistent with a cyst. Left Kidney: Renal measurements: 11.2 x 5.2 x 5.3 cm = volume: 163 mL. Echogenicity within normal limits.  No hydronephrosis visualized. Three anechoic left renal masses most consistent with cysts with the upper pole cyst measuring 2.1 x 1.7 x 1.7 cm, interpolar cyst measuring 2.4 x 2.1 x 2.3 cm and lower pole renal cyst measuring 2 x 2 x 2 cm. Bladder: Decompressed bladder with a Foley catheter present. Other: None. IMPRESSION: 1. No obstructive uropathy. 2. Bilateral renal cysts. Electronically Signed   By: Kathreen Devoid   On: 09/19/2019 15:08   CT Hip Right Wo Contrast  Result Date: 09/19/2019 CLINICAL DATA:  Status post fall, right hip pain. Fell 2 days ago. EXAM: CT OF THE RIGHT HIP WITHOUT CONTRAST TECHNIQUE: Multidetector CT imaging of the right hip was performed according to the standard protocol. Multiplanar CT image reconstructions were also generated. COMPARISON:  None. FINDINGS: Bones/Joint/Cartilage Generalized osteopenia. No hip fracture or dislocation. Acute nondisplaced fracture of the inferior aspect of the right sacral ala adjacent to the SI joint. Normal alignment. No joint effusion. Moderate osteoarthritis of the right hip with small marginal osteophytes and moderate joint space narrowing. Mild osteoarthritis of the right SI joint. Ligaments Ligaments are suboptimally evaluated by  CT. Muscles and Tendons Muscles are normal. No intramuscular fluid collection or hematoma. Soft tissue Small hemorrhagic contusion in the subcutaneous fat overlying the right greater trochanter with surrounding inflammatory changes. No other fluid collection. Peripheral vascular atherosclerotic disease. Small amount of pelvic free fluid. IMPRESSION: 1. Acute nondisplaced fracture of the inferior aspect of the right sacral ala adjacent to the SI joint. 2. No acute hip fracture or dislocation. 3. Small hemorrhagic contusion in the subcutaneous fat overlying the right greater trochanter with surrounding inflammatory changes. 4. Generalized osteopenia. Electronically Signed   By: Kathreen Devoid   On: 09/19/2019 12:24   Chest Portable 1 View  Result Date: 09/19/2019 CLINICAL DATA:  Fall 2 days ago with right hip pain. EXAM: PORTABLE CHEST 1 VIEW COMPARISON:  12/23/2018 FINDINGS: Lungs are adequately inflated without focal consolidation or effusion. Subtle prominence of the perihilar vessels likely mild vascular congestion. Moderate stable cardiomegaly. Remainder the exam is unchanged. IMPRESSION: Moderate stable cardiomegaly with suggestion of minimal vascular congestion. Electronically Signed   By: Marin Olp M.D.   On: 09/19/2019 14:35   DG Foot Complete Right  Result Date: 09/19/2019 CLINICAL DATA:  Recent fall with calcaneal pain, initial encounter EXAM: RIGHT FOOT COMPLETE - 3+ VIEW COMPARISON:  None. FINDINGS: Mild osteopenia is noted. No acute fracture or dislocation is noted. Tarsal degenerative changes are seen. IMPRESSION: No acute abnormality noted. Electronically Signed   By: Inez Catalina M.D.   On: 09/19/2019 11:22   DG HIP UNILAT WITH PELVIS 2-3 VIEWS LEFT  Result Date: 09/19/2019 CLINICAL DATA:  Right hip pain after a mechanical fall. EXAM: DG HIP (WITH OR WITHOUT PELVIS) 2-3V LEFT COMPARISON:  None. FINDINGS: There is no evidence of hip fracture or dislocation. Degenerative changes are seen  in the spine and hips. IMPRESSION: No acute osseous injury. Electronically Signed   By: Zerita Boers M.D.   On: 09/19/2019 09:58   DG HIP UNILAT WITH PELVIS 2-3 VIEWS RIGHT  Result Date: 09/19/2019 CLINICAL DATA:  Hip pain after a mechanical fall. EXAM: DG HIP (WITH OR WITHOUT PELVIS) 2-3V RIGHT COMPARISON:  None. FINDINGS: There is no evidence of hip fracture or dislocation. Degenerative changes are seen in the spine and hips. IMPRESSION: No acute osseous injury. Electronically Signed   By: Zerita Boers M.D.   On: 09/19/2019 09:58      Scheduled Meds: .  ALPRAZolam  0.5 mg Oral QHS  . ascorbic acid  250 mg Oral Daily  . docusate sodium  100 mg Oral BID  . gabapentin  200 mg Oral QHS  . heparin  5,000 Units Subcutaneous Q12H  . isosorbide-hydrALAZINE  1 tablet Oral TID  . senna-docusate  2 tablet Oral BID  . Vitamin D (Ergocalciferol)  50,000 Units Oral Q7 days   Continuous Infusions:   LOS: 1 day   Time spent: 25 minutes   Shelda Pal, DO [facility] 09/20/2019, 1:36 PM   Available via Epic secure chat 7am-7pm After these hours, please refer to coverage provider listed on [email or web address]

## 2019-09-20 NOTE — Plan of Care (Signed)

## 2019-09-20 NOTE — Progress Notes (Signed)
Notified PT on consult. Will see patient today.

## 2019-09-20 NOTE — Progress Notes (Addendum)
[place] [facility]) Hospitalized Hospice Patient  Jodi [name] is a current hospice patient with a terminal diagnosis of CHF per Dr. Tomasa Hosteller with [facility].  She fell two days ago and has had significant pain since.  Family wanted to try to manage her care at home, daughter is a PT and pt resides with her, but the pain was too bad.  She is admitted with a right hip fracture. This is a related hospital admission.   Prior to fall, she was relatively independent at her daughter's home.  After the fall, pt can no longer transfer or ambulate unassisted and her family has not been able to provide the help she needs.  They would like to ultimately take her back home but need to see first if her pain can be controlled to see if she can assist with transfers etc.   Today, pt alert and oriented, no complaints of pain currently. Per family, pt will not ask for pain medication.  Discussed that the staff will assess for pain but Jodi [name] has to be open to taking the medication.  Pt lives with her daughter and can return home if she can transfer relatively close to how she was before.  PT eval on 8/14, pt was able to transfer with assistance x 2. This will probably not be sufficient for her to d/c home with her daughter.  Daughter is the caregiver for her own child with physical and mental limitations.   V/S:  98.1 oral, 119/55, HR 56, RR 16, SPO2 100% [place] @ 2 lpm I&O:  /1700 Labs: Cr 2.86. gfr 17, hgb 7.7, HCT 25.4  Problem List: - right hip fracture - pain management only  GOC: clear, comfort and return home if able to D/C plan: home with family. They do not want to send her to a SNF at this time.  Not sure if PT plans to see again. She was able to get OOB with assist on Saturday, not sure if she was mobilized today. Family: Jodi [name] is [place] is her dtr and caregiver.  Decisions are made jointly by them. IDT: hospice team updated   Venia Carbon RN, BSN, [place] [facility] Liaison (in  [place] and epic chat)

## 2019-09-21 ENCOUNTER — Encounter (HOSPITAL_COMMUNITY): Payer: Self-pay | Admitting: Internal Medicine

## 2019-09-21 LAB — BASIC METABOLIC PANEL
Anion gap: 11 (ref 5–15)
BUN: 73 mg/dL — ABNORMAL HIGH (ref 8–23)
CO2: 34 mmol/L — ABNORMAL HIGH (ref 22–32)
Calcium: 9.4 mg/dL (ref 8.9–10.3)
Chloride: 92 mmol/L — ABNORMAL LOW (ref 98–111)
Creatinine, Ser: 2.35 mg/dL — ABNORMAL HIGH (ref 0.44–1.00)
GFR calc Af Amer: 21 mL/min — ABNORMAL LOW (ref >60)
GFR calc non Af Amer: 18 mL/min — ABNORMAL LOW (ref >60)
Glucose, Bld: 162 mg/dL — ABNORMAL HIGH (ref 70–99)
Potassium: 3.3 mmol/L — ABNORMAL LOW (ref 3.5–5.1)
Sodium: 137 mmol/L (ref 135–145)

## 2019-09-21 LAB — CBC
HCT: 25.9 % — ABNORMAL LOW (ref 36.0–46.0)
Hemoglobin: 7.8 g/dL — ABNORMAL LOW (ref 12.0–15.0)
MCH: 25.2 pg — ABNORMAL LOW (ref 26.0–34.0)
MCHC: 30.1 g/dL (ref 30.0–36.0)
MCV: 83.5 fL (ref 80.0–100.0)
Platelets: 171 10*3/uL (ref 150–400)
RBC: 3.1 MIL/uL — ABNORMAL LOW (ref 3.87–5.11)
RDW: 18.3 % — ABNORMAL HIGH (ref 11.5–15.5)
WBC: 8.4 10*3/uL (ref 4.0–10.5)
nRBC: 0 % (ref 0.0–0.2)

## 2019-09-21 MED ORDER — ALPRAZOLAM 0.25 MG PO TABS: 0.5000 mg | ORAL_TABLET | Freq: Every day | ORAL | Status: AC

## 2019-09-21 MED ORDER — ISOSORB DINITRATE-HYDRALAZINE 20-37.5 MG PO TABS: 1.0000 | ORAL_TABLET | Freq: Two times a day (BID) | ORAL | Status: AC

## 2019-09-21 MED ORDER — POTASSIUM CHLORIDE CRYS ER 20 MEQ PO TBCR
20.0000 meq | EXTENDED_RELEASE_TABLET | Freq: Two times a day (BID) | ORAL | Status: DC
  Administered 2019-09-21: 20 meq via ORAL
  Filled 2019-09-21: qty 1

## 2019-09-21 MED ORDER — OXYCODONE HCL 5 MG PO TABS: 5.0000 mg | ORAL_TABLET | ORAL | 0 refills | Status: AC | PRN

## 2019-09-21 MED ORDER — METOLAZONE 5 MG PO TABS: 10.0000 mg | ORAL_TABLET | Freq: Every day | ORAL | Status: AC

## 2019-09-21 MED ORDER — GABAPENTIN 100 MG PO CAPS: 200.0000 mg | ORAL_CAPSULE | Freq: Every day | ORAL | Status: AC

## 2019-09-21 MED ORDER — POTASSIUM CHLORIDE ER 20 MEQ PO TBCR: 40.0000 meq | EXTENDED_RELEASE_TABLET | Freq: Two times a day (BID) | ORAL | Status: AC

## 2019-09-21 NOTE — Discharge Summary (Signed)
Physician Discharge Summary  Jodi [name] LZJ:[id number] DOB: Mar 18, 1932 DOA: 09/19/2019  PCP: Jodi Covert, MD  Admit date: 09/19/2019 Discharge date: 09/21/2019  Admitted From: Home Disposition:  Home  Recommendations for Outpatient Follow-up:  1. Follow up with PCP in 1 week 2. Please obtain BMP/CBC in 1 week to check on creatinine/potassium and hemoglobin respectively.  3. There are no pending results at time of d/c. 4. Ensure pain management is adequate on current regimen.   Home Health: PT  Equipment/Devices: 3 in 1   Discharge Condition: Good CODE STATUS: DNR  Diet recommendation: Renal  Brief/Interim Summary: From H&P: Dr. Jadene [name] note: "Jodi [name] is a 86 y.o. female with medical history significant of severe aortic stenosis, moderate mitral regurgitation, CKD stage IV, chronic A. fib not on anticoagulation because of frequent falls, chronic systolic CHF with EF 30 to 35%, moderate pulmonary hypertension, HTN, CAD, chronic back pain, presented with fall and right-sided back pain and hip pain.  Patient had a mechanical fall 2 days ago, when she was trying to sit down on the sofa, instead she said on the wooden handle of the sofa, immediately she felt significant pain on her tailbone and right side of the hip.  Over the last 2 days, patient's daughter, who is a physical therapist has a cast hip to help the patient ambulate, however because of the pain patient was not even able to transfer herself from bed to bedside commode, and had to use diaper since yesterday evening changing diapers according significant pain. Also, she developed bruising and swelling of the right hip and came to ED.  She denied any lightheadedness palpitations before the fall.  No chest pain, abdominal pain."  Interim: Patient did not require any IV pain medication and was relatively well controlled on oral pain medication that she is on at home.  Physical therapy evaluated the patient  and she was able to transfer without significant difficulty.  Home physical therapy was recommended and ordered.  I spoke with her daughter who she lives with, Jodi [name], and she was comfortable having the patient come back as long as she was able to transfer with some independence.  Discharge Diagnoses:  Principal Problem:   Sacral fracture, closed ([place]) Active Problems:   Chronic combined systolic and diastolic congestive heart failure ([place])   Fall   AKI (acute kidney injury) ([place])   Anxiety state  We will resume her home oxycodone.  Home physical therapy is set up.  Her renal function improved back to baseline during her stay.  Nutrition Problem: Increased nutrient needs Etiology: acute illness (sacral fracture)  Discharge Instructions  Discharge Instructions    Call MD for:  extreme fatigue   Complete by: As directed    Call MD for:  severe uncontrolled pain   Complete by: As directed    Diet - low sodium heart healthy   Complete by: As directed    Increase activity slowly   Complete by: As directed      Allergies as of 09/21/2019      Reactions   Sulfamethoxazole-trimethoprim Other (See Comments)   Caused shaking and chills   Tape Other (See Comments)   Tears skin.  Please use "paper" tape      Medication List    STOP taking these medications   polyethylene glycol 17 g packet Commonly known as: MIRALAX / GLYCOLAX   temazepam 15 MG capsule Commonly known as: RESTORIL     TAKE these medications  acetaminophen 500 MG tablet Commonly known as: TYLENOL Take 500 mg by mouth every 6 (six) hours as needed for mild pain or headache.   ALPRAZolam 0.25 MG tablet Commonly known as: XANAX Take 2 tablets (0.5 mg total) by mouth at bedtime.   AZO CRANBERRY URINARY TRACT PO Take 1 tablet by mouth daily.   furosemide 40 MG tablet Commonly known as: LASIX 1-3 per day as needed What changed:   how much to take  how to take this  when to take this  additional  instructions   gabapentin 100 MG capsule Commonly known as: NEURONTIN Take 2 capsules (200 mg total) by mouth at bedtime. What changed: See the new instructions.   indomethacin 50 MG capsule Commonly known as: INDOCIN TAKE UP TO THREE TIMES A DAY WITH FOOD AS NEEDED FOR LEG PAIN. DO NOT TAKE W IBUPROFEN What changed: See the new instructions.   isosorbide-hydrALAZINE 20-37.5 MG tablet Commonly known as: BIDIL Take 1 tablet by mouth in the morning and at bedtime.   magnesium hydroxide 400 MG/5ML suspension Commonly known as: MILK OF MAGNESIA Take 30 mLs by mouth daily as needed for mild constipation.   metolazone 5 MG tablet Commonly known as: ZAROXOLYN Take 2 tablets (10 mg total) by mouth daily at 2 PM.   nitroGLYCERIN 0.4 MG SL tablet Commonly known as: NITROSTAT Place 1 tablet (0.4 mg total) under the tongue every 5 (five) minutes as needed for chest pain (Up to 3 tablets).   oxyCODONE 5 MG immediate release tablet Commonly known as: Roxicodone Take 1 tablet (5 mg total) by mouth every 4 (four) hours as needed for severe pain. What changed:   how much to take  when to take this   oxyCODONE 5 MG immediate release tablet Commonly known as: Roxicodone Take 1 tablet (5 mg total) by mouth every 4 (four) hours as needed for severe pain. What changed: You were already taking a medication with the same name, and this prescription was added. Make sure you understand how and when to take each.   Potassium Chloride ER 20 MEQ Tbcr Take 40 mEq by mouth in the morning and at bedtime.   ProAir HFA 108 (90 Base) MCG/ACT inhaler Generic drug: albuterol INHALE 1 PUFF INTO THE LUNGS EVERY 6 HOURS AS NEEDED FOR WHEEZING OR SHORTNESS OF BREATH.   senna-docusate 8.6-50 MG tablet Commonly known as: Senexon-S Take 2 tablets by mouth 2 (two) times daily.            Durable Medical Equipment  (From admission, onward)         Start     Ordered   09/21/19 1019  DME 3-in-1  Once         09/21/19 1019          Allergies  Allergen Reactions  . Sulfamethoxazole-Trimethoprim Other (See Comments)    Caused shaking and chills  . Tape Other (See Comments)    Tears skin.  Please use "paper" tape    Consultations:  Physical therapy   Procedures/Studies: DG Shoulder Right  Result Date: 09/19/2019 CLINICAL DATA:  Mechanical fall 2 days ago with shoulder pain. EXAM: RIGHT SHOULDER - 2+ VIEW COMPARISON:  None. FINDINGS: There is no evidence of fracture or dislocation. There is no evidence of arthropathy or other focal bone abnormality. Soft tissues are unremarkable. IMPRESSION: Negative. Electronically Signed   By: Zerita Boers M.D.   On: 09/19/2019 09:56   US RENAL  Result Date: 09/19/2019 CLINICAL DATA:  Acute kidney injury EXAM: RENAL / URINARY TRACT ULTRASOUND COMPLETE COMPARISON:  02/15/2019 FINDINGS: Right Kidney: Renal measurements: 11.2 x 4.5 x 5.9 cm = volume: 157 mL . Echogenicity within normal limits. No hydronephrosis visualized. 3.7 x 1.7 x 2.5 cm anechoic right renal mass consistent with a cyst. 2.4 x 2 x 1.7 cm anechoic right inferior pole renal mass consistent with a cyst. 2.3 x 2 x 2.3 cm anechoic interpolar renal mass consistent with a cyst. Left Kidney: Renal measurements: 11.2 x 5.2 x 5.3 cm = volume: 163 mL. Echogenicity within normal limits. No hydronephrosis visualized. Three anechoic left renal masses most consistent with cysts with the upper pole cyst measuring 2.1 x 1.7 x 1.7 cm, interpolar cyst measuring 2.4 x 2.1 x 2.3 cm and lower pole renal cyst measuring 2 x 2 x 2 cm. Bladder: Decompressed bladder with a Foley catheter present. Other: None. IMPRESSION: 1. No obstructive uropathy. 2. Bilateral renal cysts. Electronically Signed   By: Kathreen Devoid   On: 09/19/2019 15:08   CT Hip Right Wo Contrast  Result Date: 09/19/2019 CLINICAL DATA:  Status post fall, right hip pain. Fell 2 days ago. EXAM: CT OF THE RIGHT HIP WITHOUT CONTRAST TECHNIQUE:  Multidetector CT imaging of the right hip was performed according to the standard protocol. Multiplanar CT image reconstructions were also generated. COMPARISON:  None. FINDINGS: Bones/Joint/Cartilage Generalized osteopenia. No hip fracture or dislocation. Acute nondisplaced fracture of the inferior aspect of the right sacral ala adjacent to the SI joint. Normal alignment. No joint effusion. Moderate osteoarthritis of the right hip with small marginal osteophytes and moderate joint space narrowing. Mild osteoarthritis of the right SI joint. Ligaments Ligaments are suboptimally evaluated by CT. Muscles and Tendons Muscles are normal. No intramuscular fluid collection or hematoma. Soft tissue Small hemorrhagic contusion in the subcutaneous fat overlying the right greater trochanter with surrounding inflammatory changes. No other fluid collection. Peripheral vascular atherosclerotic disease. Small amount of pelvic free fluid. IMPRESSION: 1. Acute nondisplaced fracture of the inferior aspect of the right sacral ala adjacent to the SI joint. 2. No acute hip fracture or dislocation. 3. Small hemorrhagic contusion in the subcutaneous fat overlying the right greater trochanter with surrounding inflammatory changes. 4. Generalized osteopenia. Electronically Signed   By: Kathreen Devoid   On: 09/19/2019 12:24   Chest Portable 1 View  Result Date: 09/19/2019 CLINICAL DATA:  Fall 2 days ago with right hip pain. EXAM: PORTABLE CHEST 1 VIEW COMPARISON:  12/23/2018 FINDINGS: Lungs are adequately inflated without focal consolidation or effusion. Subtle prominence of the perihilar vessels likely mild vascular congestion. Moderate stable cardiomegaly. Remainder the exam is unchanged. IMPRESSION: Moderate stable cardiomegaly with suggestion of minimal vascular congestion. Electronically Signed   By: Marin Olp M.D.   On: 09/19/2019 14:35   DG Foot Complete Right  Result Date: 09/19/2019 CLINICAL DATA:  Recent fall with  calcaneal pain, initial encounter EXAM: RIGHT FOOT COMPLETE - 3+ VIEW COMPARISON:  None. FINDINGS: Mild osteopenia is noted. No acute fracture or dislocation is noted. Tarsal degenerative changes are seen. IMPRESSION: No acute abnormality noted. Electronically Signed   By: Inez Catalina M.D.   On: 09/19/2019 11:22   DG HIP UNILAT WITH PELVIS 2-3 VIEWS LEFT  Result Date: 09/19/2019 CLINICAL DATA:  Right hip pain after a mechanical fall. EXAM: DG HIP (WITH OR WITHOUT PELVIS) 2-3V LEFT COMPARISON:  None. FINDINGS: There is no evidence of hip fracture or dislocation. Degenerative changes are seen in the spine and hips. IMPRESSION:  No acute osseous injury. Electronically Signed   By: Zerita Boers M.D.   On: 09/19/2019 09:58   DG HIP UNILAT WITH PELVIS 2-3 VIEWS RIGHT  Result Date: 09/19/2019 CLINICAL DATA:  Hip pain after a mechanical fall. EXAM: DG HIP (WITH OR WITHOUT PELVIS) 2-3V RIGHT COMPARISON:  None. FINDINGS: There is no evidence of hip fracture or dislocation. Degenerative changes are seen in the spine and hips. IMPRESSION: No acute osseous injury. Electronically Signed   By: Zerita Boers M.D.   On: 09/19/2019 09:58       Discharge Exam: Vitals:   09/20/19 1933 09/21/19 0540  BP: 119/83 111/64  Pulse: 66 (!) 47  Resp: 16 15  Temp: 97.8 F (36.6 C) 97.6 F (36.4 C)  SpO2: 99% 100%    General: Pt is alert, awake, not in acute distress Cardiovascular: RRR, S1/S2 +, no edema Respiratory: CTA bilaterally, no wheezing, no rhonchi, no respiratory distress, no conversational dyspnea  Abdominal: Soft, NT, ND, bowel sounds + Extremities: no edema, no cyanosis Psych: Normal mood and affect     The results of significant diagnostics from this hospitalization (including imaging, microbiology, ancillary and laboratory) are listed below for reference.     Microbiology: Recent Results (from the past 240 hour(s))  SARS Coronavirus 2 by RT PCR (hospital order, performed in [facility]  hospital lab) Nasopharyngeal Nasopharyngeal Swab     Status: None   Collection Time: 09/19/19 12:48 PM   Specimen: Nasopharyngeal Swab  Result Value Ref Range Status   SARS Coronavirus 2 NEGATIVE NEGATIVE Final    Comment: (NOTE) SARS-CoV-2 target nucleic acids are NOT DETECTED.  The SARS-CoV-2 RNA is generally detectable in upper and lower respiratory specimens during the acute phase of infection. The lowest concentration of SARS-CoV-2 viral copies this assay can detect is 250 copies / mL. A negative result does not preclude SARS-CoV-2 infection and should not be used as the sole basis for treatment or other patient management decisions.  A negative result may occur with improper specimen collection / handling, submission of specimen other than nasopharyngeal swab, presence of viral mutation(s) within the areas targeted by this assay, and inadequate number of viral copies (<250 copies / mL). A negative result must be combined with clinical observations, patient history, and epidemiological information.  Fact Sheet for Patients:   StrictlyIdeas.no  Fact Sheet for Healthcare Providers: BankingDealers.co.za  This test is not yet approved or  cleared by the [place] FDA and has been authorized for detection and/or diagnosis of SARS-CoV-2 by FDA under an Emergency Use Authorization (EUA).  This EUA will remain in effect (meaning this test can be used) for the duration of the COVID-19 declaration under Section 564(b)(1) of the Act, 21 U.S.C. section 360bbb-3(b)(1), unless the authorization is terminated or revoked sooner.  Performed at [facility], [place] [address], [place], [place] [address]      Labs: BNP (last 3 results) Recent Labs    10/22/18 1639 12/23/18 1854 02/15/19 0004  BNP 791.1* 1,158.0* 2,979.8*   Basic Metabolic Panel: Recent Labs  Lab 09/19/19 0907 09/19/19 1640 09/20/19 0208 09/21/19 0104  NA  139  --  137 137  K 5.1  --  3.9 3.3*  CL 94*  --  92* 92*  CO2 32  --  33* 34*  GLUCOSE 131*  --  140* 162*  BUN 92*  --  85* 73*  CREATININE 3.56*  --  2.86* 2.35*  CALCIUM 10.0  --  9.3  9.4  PHOS  --  6.2*  --   --    Liver Function Tests: Recent Labs  Lab 09/19/19 0907  AST 18  ALT 10  ALKPHOS 65  BILITOT 1.1  PROT 6.9  ALBUMIN 3.8   CBC: Recent Labs  Lab 09/19/19 0907 09/20/19 0208 09/21/19 0104  WBC 7.3 7.7 8.4  NEUTROABS 3.9  --   --   HGB 8.5* 7.7* 7.8*  HCT 30.4* 25.4* 25.9*  MCV 84.9 83.0 83.5  PLT 163 157 171   Urinalysis    Component Value Date/Time   COLORURINE YELLOW 02/15/2019 0336   APPEARANCEUR HAZY (A) 02/15/2019 0336   LABSPEC 1.011 02/15/2019 0336   PHURINE 6.0 02/15/2019 0336   GLUCOSEU NEGATIVE 02/15/2019 0336   HGBUR NEGATIVE 02/15/2019 0336   HGBUR moderate 10/07/2008 1055   BILIRUBINUR NEGATIVE 02/15/2019 0336   BILIRUBINUR negative 01/27/2018 1340   BILIRUBINUR NEG 03/11/2014 1010   KETONESUR NEGATIVE 02/15/2019 0336   PROTEINUR NEGATIVE 02/15/2019 0336   UROBILINOGEN 0.2 01/27/2018 1340   UROBILINOGEN 0.2 08/20/2013 2000   NITRITE NEGATIVE 02/15/2019 0336   LEUKOCYTESUR MODERATE (A) 02/15/2019 0336   Microbiology Recent Results (from the past 240 hour(s))  SARS Coronavirus 2 by RT PCR (hospital order, performed in [facility] hospital lab) Nasopharyngeal Nasopharyngeal Swab     Status: None   Collection Time: 09/19/19 12:48 PM   Specimen: Nasopharyngeal Swab  Result Value Ref Range Status   SARS Coronavirus 2 NEGATIVE NEGATIVE Final    Comment: (NOTE) SARS-CoV-2 target nucleic acids are NOT DETECTED.  The SARS-CoV-2 RNA is generally detectable in upper and lower respiratory specimens during the acute phase of infection. The lowest concentration of SARS-CoV-2 viral copies this assay can detect is 250 copies / mL. A negative result does not preclude SARS-CoV-2 infection and should not be used as the sole basis for treatment  or other patient management decisions.  A negative result may occur with improper specimen collection / handling, submission of specimen other than nasopharyngeal swab, presence of viral mutation(s) within the areas targeted by this assay, and inadequate number of viral copies (<250 copies / mL). A negative result must be combined with clinical observations, patient history, and epidemiological information.  Fact Sheet for Patients:   StrictlyIdeas.no  Fact Sheet for Healthcare Providers: BankingDealers.co.za  This test is not yet approved or  cleared by the [place] FDA and has been authorized for detection and/or diagnosis of SARS-CoV-2 by FDA under an Emergency Use Authorization (EUA).  This EUA will remain in effect (meaning this test can be used) for the duration of the COVID-19 declaration under Section 564(b)(1) of the Act, 21 U.S.C. section 360bbb-3(b)(1), unless the authorization is terminated or revoked sooner.  Performed at [facility], [place] [address] [place], [place] [address]      Patient was seen and examined on the day of discharge and was found to be in stable condition. Time coordinating discharge: 35 minutes including assessment and coordination of care, as well as examination of the patient.   SIGNED:  Shelda Pal, DO [facility] 09/21/2019, 3:55 PM

## 2019-09-21 NOTE — Progress Notes (Signed)
Pt IV removed, dressed, and belongings gathered. AVS in packet for PTAR and daughter called and notified of pt d/c. All questions answered.

## 2019-09-21 NOTE — Progress Notes (Signed)
[facility] ([facility])  Pt is ready to d/c home.  Please use [facility] for ambulance transport, they contract this service for our active hospice pts.  Pt dtr, Jodi [name], will not be home until after 3 today due to caring for her special needs child.  Please arrange for transport after 3.  Please call Jodi [name] once transport arrives so she can assure that someone is at the home to receive her mother Jodi [name].  Venia Carbon RN, BSN, CCRN

## 2019-09-21 NOTE — Plan of Care (Signed)
  Problem: Education: Goal: Knowledge of General Education information will improve Description Including pain rating scale, medication(s)/side effects and non-pharmacologic comfort measures Outcome: Progressing   Problem: Clinical Measurements: Goal: Ability to maintain clinical measurements within normal limits will improve Outcome: Progressing   Problem: Activity: Goal: Risk for activity intolerance will decrease Outcome: Progressing   

## 2019-09-21 NOTE — Discharge Instructions (Signed)

## 2019-09-21 NOTE — Progress Notes (Signed)
Provided boost breeze which she enjoyed

## 2019-09-21 NOTE — TOC Transition Note (Signed)
Transition of Care [facility]) - CM/SW Discharge Note   Patient Details  Name: Jodi [name] MRN: [id number] Date of Birth: October 22, 1932  [facility]) CM/SW Contact:  Sharin Mons, RN Phone Number: 09/21/2019, 10:14 AM   Clinical Narrative:     Patient will DC to: HOME  Anticipated DC date: 09/21/2019 Family notified: Jodi [name] Transport by: Jodi [name]  Admitted s/p fall, suffered pelvic fx. Hx of falls, CHF, PH, HTN,CADand chronic back pain. From home with daughter Jodi [name]. Hospice patient Aeronautical engineer).  Per MD patient ready for DC today . RN, patient, patient's family  And [place] aware of DC plan.  Transportation papers on front of chart. [facility],  ambulance transport requested for patient. Timed pick up for 3pm.  Jodi [name] (Daughter)     [phone]       RNCM will sign off for now as intervention is no longer needed. Please consult [place] again if new needs arise.   Final next level of care: Jodi [name] (resumption of [place]) Barriers to Discharge: No Barriers Identified   Patient Goals and CMS Choice        Discharge Placement                       Discharge Plan and Services                                     Social Determinants of Health (SDOH) Interventions     Readmission Risk Interventions Readmission Risk Prevention Plan 02/19/2019  Transportation Screening Complete  Medication Review Press photographer) Complete  HRI or [place] Complete  SW Recovery Care/Counseling Consult Complete  Palliative Care Screening Complete  [place] Not Applicable  Some recent data might be hidden

## 2019-10-04 ENCOUNTER — Other Ambulatory Visit: Payer: Self-pay | Admitting: Cardiology

## 2019-10-16 ENCOUNTER — Telehealth: Payer: Self-pay | Admitting: Family Medicine

## 2019-10-16 ENCOUNTER — Telehealth: Payer: Self-pay

## 2019-10-16 NOTE — Telephone Encounter (Signed)
Mendel Ryder, [facility] Nurse, calls nurse line reporting patients restart of lasix. Mendel Ryder states [name] began giving her 80mg  a day a few days ago to help with pain and swelling. Mendel Ryder reports this has helped, therefore encouraged them to continue. Lindsey's question is whether or not she should restart Metolazone? You can contact [place] with this information over the weekend. Please advise.

## 2019-10-16 NOTE — Telephone Encounter (Signed)
[facility] hospice nurse @ [phone], called to ask if pt should return back taking her Metolazone? After pt's fall she has since been refusing that medicine and has declined. No longer at her baseline, please advise.

## 2019-10-17 NOTE — Telephone Encounter (Signed)
Called and left vm to call me back  Communicated with patients daughter and advised to try one dose of metolazone and let me know

## 2019-10-17 NOTE — Telephone Encounter (Signed)
See prior phone note. 

## 2019-10-19 ENCOUNTER — Telehealth: Payer: Self-pay | Admitting: Family Medicine

## 2019-10-19 NOTE — Telephone Encounter (Signed)
Discussed her Moms fluid status  Decided to cut back to Lasix 40 mg daily without metolazone and monitor day to day  [name] will be in touch.

## 2019-10-30 ENCOUNTER — Telehealth: Payer: Self-pay

## 2019-11-02 ENCOUNTER — Telehealth: Payer: Self-pay | Admitting: Family Medicine

## 2019-11-02 NOTE — Telephone Encounter (Signed)
Death Certificate was dropped off by [facility] and has been placed in PCP's box for completion. Please use blue or black ink. Please return to [facility] once completed.

## 2019-11-03 NOTE — Telephone Encounter (Signed)
Completed and returned today.

## 2019-11-06 NOTE — Telephone Encounter (Signed)
Received a VM on nurse line reporting patients passing. Patient passed away 2019/11/13.

## 2019-11-06 DEATH — deceased

## 2021-10-16 ENCOUNTER — Telehealth: Payer: Self-pay

## 2021-10-16 NOTE — Telephone Encounter (Signed)
-----   Message from [facility], [place] sent at 08/15/2021  1:12 PM EDT ----- Regarding: RE: No DOD Dorna Bloom, CMA     November 15, 2019 3:42 PM Note Received a VM on nurse line reporting patients passing. Patient passed away 15-Nov-2019.      ----- Message ----- From: Johney Frame, Adelina Mings, [place] Sent: 08/15/2021   1:11 PM EDT To: Aviva Signs, CMA Subject: No DOD                                         Patient is deceased
